# Patient Record
Sex: Male | Born: 1960 | Race: White | Hispanic: No | Smoking: Current some day smoker
Health system: Southern US, Community
[De-identification: ages and names within clinical notes are randomized; demographics above are authoritative.]

## PROBLEM LIST (undated history)

## (undated) VITALS — BP 140/103 | HR 80 | Temp 97.5°F | Resp 16 | Ht 71.0 in | Wt 160.0 lb

## (undated) DIAGNOSIS — F32A Depression, unspecified: Secondary | ICD-10-CM

## (undated) DIAGNOSIS — F419 Anxiety disorder, unspecified: Secondary | ICD-10-CM

## (undated) DIAGNOSIS — F319 Bipolar disorder, unspecified: Secondary | ICD-10-CM

## (undated) HISTORY — DX: Depression, unspecified: F32.A

## (undated) HISTORY — PX: EYE SURGERY: SHX253

## (undated) HISTORY — DX: Anxiety disorder, unspecified: F41.9

---

## 1999-12-15 ENCOUNTER — Ambulatory Visit (HOSPITAL_BASED_OUTPATIENT_CLINIC_OR_DEPARTMENT_OTHER): Admission: RE | Admit: 1999-12-15 | Discharge: 1999-12-15 | Payer: Self-pay | Admitting: Unknown Physician Specialty

## 2000-02-16 ENCOUNTER — Ambulatory Visit (HOSPITAL_BASED_OUTPATIENT_CLINIC_OR_DEPARTMENT_OTHER): Admission: RE | Admit: 2000-02-16 | Discharge: 2000-02-16 | Payer: Self-pay | Admitting: Otolaryngology

## 2004-03-31 ENCOUNTER — Inpatient Hospital Stay (HOSPITAL_COMMUNITY): Admission: AD | Admit: 2004-03-31 | Discharge: 2004-04-08 | Payer: Self-pay | Admitting: Psychiatry

## 2004-03-31 ENCOUNTER — Ambulatory Visit: Payer: Self-pay | Admitting: Psychiatry

## 2004-04-02 ENCOUNTER — Encounter: Payer: Self-pay | Admitting: Unknown Physician Specialty

## 2004-06-04 ENCOUNTER — Ambulatory Visit: Payer: Self-pay | Admitting: Psychiatry

## 2004-06-04 ENCOUNTER — Inpatient Hospital Stay (HOSPITAL_COMMUNITY): Admission: RE | Admit: 2004-06-04 | Discharge: 2004-06-14 | Payer: Self-pay | Admitting: Psychiatry

## 2008-12-06 ENCOUNTER — Emergency Department (HOSPITAL_COMMUNITY): Admission: EM | Admit: 2008-12-06 | Discharge: 2008-12-07 | Payer: Self-pay | Admitting: Emergency Medicine

## 2010-05-07 ENCOUNTER — Encounter (HOSPITAL_COMMUNITY): Payer: Self-pay | Admitting: Psychiatry

## 2010-07-23 LAB — CBC
HCT: 41 % (ref 39.0–52.0)
Hemoglobin: 14.1 g/dL (ref 13.0–17.0)
MCHC: 34.5 g/dL (ref 30.0–36.0)
MCV: 90.8 fL (ref 78.0–100.0)
Platelets: 261 10*3/uL (ref 150–400)
RBC: 4.51 MIL/uL (ref 4.22–5.81)
RDW: 12.3 % (ref 11.5–15.5)
WBC: 9.3 10*3/uL (ref 4.0–10.5)

## 2010-07-23 LAB — POCT I-STAT, CHEM 8
Calcium, Ion: 1.28 mmol/L (ref 1.12–1.32)
Creatinine, Ser: 1.1 mg/dL (ref 0.4–1.5)
Glucose, Bld: 103 mg/dL — ABNORMAL HIGH (ref 70–99)
Hemoglobin: 14.3 g/dL (ref 13.0–17.0)
TCO2: 25 mmol/L (ref 0–100)

## 2010-07-23 LAB — DIFFERENTIAL
Basophils Absolute: 0 10*3/uL (ref 0.0–0.1)
Basophils Relative: 0 % (ref 0–1)
Eosinophils Absolute: 0.1 10*3/uL (ref 0.0–0.7)
Eosinophils Relative: 1 % (ref 0–5)
Lymphocytes Relative: 23 % (ref 12–46)
Lymphs Abs: 2.1 10*3/uL (ref 0.7–4.0)
Monocytes Absolute: 0.5 10*3/uL (ref 0.1–1.0)
Monocytes Relative: 6 % (ref 3–12)
Neutro Abs: 6.4 10*3/uL (ref 1.7–7.7)
Neutrophils Relative %: 69 % (ref 43–77)

## 2010-07-23 LAB — RAPID URINE DRUG SCREEN, HOSP PERFORMED
Amphetamines: NOT DETECTED
Barbiturates: NOT DETECTED
Benzodiazepines: NOT DETECTED
Cocaine: NOT DETECTED
Opiates: NOT DETECTED
Tetrahydrocannabinol: NOT DETECTED

## 2010-07-23 LAB — ETHANOL: Alcohol, Ethyl (B): <5 mg/dL (ref 0–10)

## 2010-09-02 NOTE — H&P (Signed)
[name], [name]               ACCOUNT NO.:  [id number]   MEDICAL RECORD NO.:  [id number]          PATIENT TYPE:  IPS   LOCATION:  0400                          FACILITY:  [facility]   PHYSICIAN:  Geoffery Lyons, M.D.      DATE OF BIRTH:  [birth year]   DATE OF ADMISSION:  03/31/2004  DATE OF DISCHARGE:                         PSYCHIATRIC ADMISSION ASSESSMENT   IDENTIFYING INFORMATION:  This is a 50 year old white male who is widowed.  This is a voluntary admission.   HISTORY OF PRESENT ILLNESS:  This patient presented after cutting his ear  with scissors trying to cut off his ear lobe.  He was saying that he was God  and needed to cut off his ear.  [facility] had [facility].  He has a history of several days of becoming more  disorganized.  He had earlier in the week taken one of his sons to a  restaurant to go out to eat and forgot that he had this son with him and  left the restaurant, leaving the son there.  He seemed to be intrusive,  hugging strangers that he did not know yesterday at [facility].  He went  home because his father felt that he could stay with him and keep him safe  and he cut himself with the scissors and was returned to the emergency room  by his father.  Today I am unable to contact the family to get history  directly and the patient is a poor historian because of his psychosis.  The  nurse at [facility], Terrilee Croak, has contributed to his history.   PAST PSYCHIATRIC HISTORY:  The patient has been followed at [facility] on and off for quite some time, but has been generally noncompliant  with his medications.  He does have a history of one prior admission, that  is known, to [facility] at age 58.  This is his first admission to  [facility].   SOCIAL HISTORY:  The patient is a widow.  His wife committed suicide about a  year ago.  He is the father of two children, whom he cares for.  Had  no  known legal charges.  Employment is not known.  He does have the support of  his father, who assists him.   FAMILY HISTORY:  Not available.   ALCOHOL AND DRUG HISTORY:  [facility] has reported that they feel that he  has a problem of alcohol abuse and abused considerably more than the one 40  ounce beer per day that his father believes he is drinking and is what his  father has reported.   MEDICAL HISTORY:  The patient's primary care physician is a Dr. Meliton Rattan.   MEDICAL PROBLEMS:  Laceration of his ear.  No other known acute medical  problems.   CURRENT MEDICATIONS:  Only known medications are:  1.  Risperdal 1 mg p.o. q.h.s.  The patient did receive his first Risperdal      Consta injection 25 mg IM March 31, 2004.  2.  Also was on Lithium 450 mg three tablets q.h.s., but he has not been      taking this regularly either.  3.  Had also been prescribed trazodone 100 mg two to three tablets at      bedtime.  4.  In the past he has used Seroquel 200 mg one or two tabs at nighttime,      but this was discontinued because of the patient's complaint of      developing a reddened face and his physician was unclear if it was a      rash or flushing.   DRUG ALLERGIES:  Questionable SEROQUEL reaction.   POSITIVE PHYSICAL FINDINGS:  This is a well-nourished, well-developed male  who appears to be his stated age of 42.  He is a wearing a large bandage  around his head and we are not attempting to undo that.  He is quite  psychotic and unable to tolerate a full physical exam.  He did receive a  full physical exam in the emergency room last night and it is noted in the  record.  Vital signs on admission - Temperature 99.5; pulse 88; blood  pressure 150/78; O2 saturation was 98% in the emergency room.  He is 169  pounds and 5 feet 8.5 inches tall.  His right ear lobe was sutured in the  emergency room.   DIAGNOSTIC STUDIES:  The patient has an elevated WBC of 13,600,  hemoglobin  12,800, hematocrit 36.3.  His MCV is normal at 90.1.  His platelets are  376,000.  He did have elevated absolute neutrophils at 11.4000.  Electrolytes reveal a very slightly decreased potassium at 3.4.  His glucose  was elevated at 126 on a fasting specimen.  BUN 7, creatinine 1.7.  Liver  enzymes are normal.  Urine drug screen negative for all substances.  His  Lithium was found to be sub-therapeutic at 0.25.  Alcohol level negative.  Routine urinalysis was normal, except for some 15 mg of ketones/dL.  TSH is  currently pending.   MENTAL STATUS EXAM:  This is a fully alert male who has been wandering in  the halls today, staring at people somewhat inappropriately with a blank,  flattened affect.  He looks at me when I ask him his name, but he gives no  response.  Appears to be internally stimulated.  Does not respond verbally  or appropriately.  Somewhat moderately difficult to direct.  Mood is calm  with some impulsivity.  He has screamed out loudly occasionally.  He does  not appear to be in any physical distress.  Thought process - Patient  appears to be internally stimulated.  No overt homicidal or suicidal  thoughts or behaviors.  He is unable to contract for safety.  He is  impulsive, unpredictable.  This morning he did vomit into the toilet and  then reach down to wash his mouth out with the toilet water.  He has been  inappropriately reaching out to touch and hug females and wandering into  rooms inappropriately.  Cognitively he is unable to respond to questions  about person, place or time.   INITIAL DIAGNOSES:   AXIS I:  1.  Psychosis, not otherwise specified.  2.  Rule out bipolar I disorder.  3.  Ethanol abuse and dependence.   AXIS II:  Deferred.   AXIS III:  1.  Leukocytosis.  2.  Rule out an upper respiratory infection.  3.  Right ear laceration.   AXIS IV:  Not known.  AXIS V:  Current 30; past year unknown.   PLAN:  Voluntarily admit the  patient.  We have placed him in our intensive  care unit on one-to-one observation and we will obtain an acetaminophen  level.  Meanwhile, we will restart his Lithium at 300 mg p.o. b.i.d. and  recheck his Lithium the day after tomorrow.  Meanwhile we have restarted him  on Risperdal 1 mg p.o. q.4 h., M-Tab q.4 h. p.r.n. agitation.  We are going  to place him on intake and output and watch him closely.  We will provide  him with  routine laceration care.  We are going to attempt to get a hold of his  family and see if they can give [place] any additional history.   ESTIMATED LENGTH OF STAY:  Five days.     Marg   MAS/MEDQ  D:  04/01/2004  T:  04/01/2004  Job:  [id number]

## 2010-09-02 NOTE — Discharge Summary (Signed)
[name], [name]               ACCOUNT NO.:  [id number]   MEDICAL RECORD NO.:  [id number]          PATIENT TYPE:  IPS   LOCATION:  0302                          FACILITY:  [facility]   PHYSICIAN:  [name], M.D.      DATE OF BIRTH:  October 09, 1960   DATE OF ADMISSION:  06/04/2004  DATE OF DISCHARGE:  06/14/2004                                 DISCHARGE SUMMARY   CHIEF COMPLAINT AND PRESENT ILLNESS:  This was one of several admissions to  [name] for this 50 year old widowed white male.  Presented complaining that he has been severely depressed for the past two  weeks.  Reporting severe insomnia, decrease in appetite as well as  uncontrollable crying.  Reports suicidal thoughts with thoughts of  overdosing.  He is under stress because he is likely to lose custody of his  two [name].  His wife committed suicide 12-18 months prior to this  admission.  His in-laws have custody of the [name].  The patient  acknowledged that he is not able to provide care for his 71 and 33-year-old  sons at this particular time.   PAST PSYCHIATRIC HISTORY:  Admitted in December.  Acutely manic.  Had prior  admissions around age 64 to [name].  Being followed by [name].   ALCOHOL/DRUG HISTORY:  Denies recent use of substances.  Has abused alcohol  in the past.   MEDICAL HISTORY:  Recent pneumonia.   MEDICATIONS:  Lithium 900 mg at night and 600 mg in the morning, trazodone  200 mg at night, Ambien 10 mg at night for sleep.   PHYSICAL EXAMINATION:  Performed and failed to show any acute findings.   LABORATORY DATA:  CBC with white blood cells 12.4, hematocrit 38.8.  Blood  chemistries with glucose 109, SGOT 14, SGPT 15.  TSH 1.285.  Lithium level  1.21.  Drug screen negative for substances of abuse.   MENTAL STATUS EXAM:  Alert, cooperative male.  Appropriately groomed,  casually dressed.  Speech was not pressured.  He was not as  spontaneous.  Some psychomotor retardation.  Mood was depressed.  Affect was constricted.  Thought processes were clear, rational and goal-oriented.  No evidence of  active delusions.  No hallucinations.  Cognition was well-preserved.   ADMISSION DIAGNOSES:   AXIS I:  Bipolar disorder, depressed.   AXIS II:  No diagnosis.   AXIS III:  Upper respiratory tract infection, treated with antibiotics.   AXIS IV:  Moderate.   AXIS V:  Global Assessment of Functioning upon admission 25-30; highest  Global Assessment of Functioning in the last year 60.   [name] COURSE:  He was admitted and started in individual and group  psychotherapy.  He was kept on Risperdal 0.5 mg every six hours as needed,  maintained on lithium 600 mg in the morning and 900 mg at night, trazodone  200 mg at night, Ambien 10 mg at bedtime as needed for sleep.  Was started  on Wellbutrin XL 150 mg in the morning.  Trazodone and Ambien were not  working, so he was given some Seroquel that was increased up to 100 mg at  bedtime.  He did endorse increased signs and symptoms of depression.  He has  been taking the lithium as prescribed but, in the last several months, he  has experienced increased sadness, decreased energy, decreased motivation,  feeling overwhelmed, endorses suicidal ideation although he could contract  for safety.  Main stressor is the possibility of the in-laws taking custody  of the two [name].  Endorses that he does not sleep, he just lies there,  depressed.  There was a sense of hopelessness, helplessness, wanting to give  up. We worked with the Wellbutrin.  Sleep was an issue even with his  sleeping aid, so he was switched to the Seroquel.  Concerns about the  persistent depression that he could not shake.  He went to the grief and  loss group.  He felt no emotion but he was able to cry afterwards.  There  was some persistent psychomotor retardation, very slow processing.  Having a  hard time  taking the issue of the custody of his [name] out of his mind.  We continued to adjust the medication.  Still not sleeping well.  By  February 22nd, still depressed.  We went ahead and increased the Wellbutrin  to 300 mg per day.  On February 23rd, he was considering that if he was  discharged, it would be easier for him to recover if he was home.  His dad  was not agreeable with this possibility.  He continued to evidence the  psychomotor retardation.  Started sleeping but he was still very tired.  He  laid in his bed with his eyes closed wanting to shut the outside, feeling  overwhelmed.  Also endorsed that going to groups and going to even the  cafeteria was pretty overwhelming for him.  Continued to work with the  medications.  By February 26th, there was turning around.  Seemed to be some  improvement.  The concern was the continuous issue with the custody was  going to keep exacerbating [name]'s condition.  By February 28th, he was  better.  He was tolerating the medication well.  More alert.  Objectively,  affect was broader.  He was more involved.  He was sleeping through the  night.  Endorsed no suicidal or homicidal ideation.  We went ahead and  discharged to outpatient follow-up.   DISCHARGE DIAGNOSES:   AXIS I:  Bipolar disorder, depressed.   AXIS II:  No diagnosis.   AXIS III:  Upper respiratory tract infection, resolved   AXIS IV:  Moderate.   AXIS V:  Global Assessment of Functioning upon discharge 50-55.   DISCHARGE MEDICATIONS:  1.  Lithium 300 mg, 2 in the morning, 3 at night.  2.  Inderal 40 mg twice a day.  3.  Wellbutrin XL 300 mg, 1 in the morning.  4.  Seroquel 100 mg, 1-1/2 at night.  5.  Rozerem 8 mg at bedtime.   FOLLOW UP:  Dr. Lang Snow at [name].      IL/MEDQ  D:  07/12/2004  T:  07/12/2004  Job:  [id number]

## 2010-09-02 NOTE — Discharge Summary (Signed)
NAMEKELSO, BIBBY               ACCOUNT NO.:  [id number]   MEDICAL RECORD NO.:  [id number]          PATIENT TYPE:  IPS   LOCATION:  0406                          FACILITY:  [facility]   PHYSICIAN:  Syed T. Arfeen, M.D.   DATE OF BIRTH:  [birth year]   DATE OF ADMISSION:  03/31/2004  DATE OF DISCHARGE:  04/08/2004                                 DISCHARGE SUMMARY   HISTORY OF PRESENT ILLNESS AND IDENTIFYING INFORMATION:  The patient is a 50-  year-old white male who is widowed.  This is a voluntary admission.  The  patient presented after cutting his ear with scissors, trying to cut off his  earlobe.  He was saying that he is god and needed to cut off his ear.  He  [facility].  The patient has a history of  becoming very disorganized, grandiose for the past several days.  He had,  earlier in this week, taken one of his sons to the restaurant to go out to  eat and forgot that his son was with him and left the restaurant.  The  patient was noted very intrusive, hugging strangers that he did not know  yesterday at [facility].  The patient went home because his father felt  that he could stay with him and keep him safe, and he cut himself with  scissors and was returned to the emergency room by his father.  On the day  of admission, the patient was very disorganized and unable to provide  information, although he said that he has past psychiatric history, and he  has been not taking his medication for quite some time.   PAST PSYCHIATRIC HISTORY:  The patient has been followed at [facility] and has been noncompliant with his medication.  The patient  was a poor historian and could not give more information.  However, as per  record, the patient has been hospitalized at least one time at [facility] at age 33.  This was the patient's first admission to [facility].   ALCOHOL AND DRUG HISTORY:  As per chart.  The patient  has a history of  alcohol abuse.  However, the patient was unable to provide any information.   MEDICAL PROBLEM:  Laceration of his ear, however, no other acute medical  problems.   PHYSICAL FINDINGS:  The patient is a well-nourished, well-developed man who  appears to be his staged age of 43.  He is wearing a large bandage around  his head.  The patient was seen very psychotic, unable to tolerate a full  physical examination.  However, he had an examination done in the ER.  Vital  signs stable.   LABORATORY DATA:  The patient has an elevated white count of 13,600,  hemoglobin and hematocrit slightly on the lower side.  He has a high  neutrophil count.  Electrolytes were normal, other than decreased potassium,  which was 3.4.  Glucose was also elevated.  BUN and creatinine were normal.  Liver enzymes were normal.  Urine drug screen was negative for any illegal  substances.  His lithium was found to be sub-therapeutic at 0.25.  Alcohol  level was negative.   MENTAL STATUS EXAMINATION:  The patient is a fully alert man who has been  wandering in the hall today, staring at the people somewhat inappropriately  with blank and flattened affect.  He appears to be internally preoccupied.  It seems like he is responding to internal voices.  He was difficult to  engage in conversation, though he appears calm.  His thought processes were  illogical, irrational and difficult.  He denies any overt homicidal or  suicidal thoughts.  However, he was unable to contract for safety.  He  appears very unpredictable, suspicious and guarded.  He was also noticed  reaching out to touch and hug females and wandering in their rooms  inappropriately.  The patient was unable to answer the question about  cognition and orientation.  Insight/judgment and impulse control are very  limited.   ADMISSION DIAGNOSES:   AXIS I:  1.  Psychosis, not otherwise specified.  2.  Rule out bipolar I.  3.  History of  ethanol abuse and dependence.   AXIS II:  Deferred.   AXIS III:  1.  Leukocytosis.  2.  Rule out upper respiratory infection.  3.  Right ear laceration.   AXIS IV:  Unknown.   AXIS V:  Global assessment of functioning 20.   HOSPITAL COURSE:  The patient was admitted in the 400 hall.  He was placed  on a safety check and put on one-to-one close observation.  He was restarted  on lithium, Risperdal and Librium for any possible detox from alcohol.  He  was also started on Ativan for agitation and anxiety and Cogentin for EPS  symptoms.  He was also ordered for CT scan to rule out any organic  pathology.  The patient remains very grandiose, paranoid and very labile.  He remained very isolated and maintained very poor eye contact and seemed  most of the time responding to internal stimuli.  Medications were adjusted  and increased according with therapeutic response.  Lithium was increased up  to a total of 900 mg.  He continues to show some mood lability and pressure  speech.  Lithium level was drawn and came 0.72.  He started to show some  improvement.  He reported sleeping better and was able to contract for  safety.  One-to-one was discontinued on April 04, 2004, but he continues  to have some thought blocking with pressured speech and some disorganized  thinking.  CT scan of the brain came back negative.  The patient started to  show some improvement as medications were optimized.  He was also given the  Risperdal Consta injection of 25 mg and also started on p.o. Risperdal 0.5  mg h.s.  Lithium was increased to 600 mg in the a.m. and h.s.  A family  session was scheduled with his father who addressed the patient has not been  taking his medications and being very careless since his wife died some time  ago.  The patient was responsible to take care of his 2 kids.  However, apparently, the patient's in-laws expressed some desire to take custody from  the patient due to his  mental condition.  The patient's father delivered  this news, which the patient accepted and acknowledged.  On April 05, 2004, a Emergency planning/management officer came and handed the legal paper to the  patient, which  was filed by the patient's in-laws.  The patient started to show some  improvement.  Another family session was scheduled with his father to better  communicate about his legal issues.  The patient tolerated the medication  very well.  He seemed more organized, more social and more verbal.  In  family sessions, he addressed that he has a mental illness and in need of  taking the medication.  He reported no acute side effects of the medication.  The patient was also seen by the nurse practitioner for his ear lobe injury.  He reported no pain in ear or any where else in the body.  The patient  continues to show some improvement with good sleep.  He reported no  paranoia, grandiosity or delusion.  He was seen less labile and less  anxious.  He was seen more verbal, active and social in the groups.  He  acknowledges he has some mental health and wanted to be a followup as  outpatient in [facility].  Discharge planning was  discussed in detail with the patient and the father.  He was explained about  the injection Consta should be given on April 14, 2004 at [facility].  The patient reported no acute side effects with the  medication.  He expressed desire to be discharged with followup at the  outpatient clinic.   CONDITION ON DISCHARGE:  Remarkably improved.  Mood euthymic.  Affect  bright.  Thought processes goal-directed and logical.  Denies any auditory  hallucinations, suicide ideation or homicidal ideation.  No paranoia or  delusion.  Denies any perceptions.  He feels excited and motivated to be  discharged and followup aftercare plan.   DISCHARGE MEDICATIONS:  1.  Risperdal Consta 37.5 mg IM to be given on April 14, 2004 at  [facility].  2.  Risperdal 0.5 at bedtime until April 14, 2004.  3.  Multivitamin one daily.  4.  Thiamin one daily.  5.  Lithium carbonate 600 mg a.m. and h.s.  Lithium level was 0.59 on      April 07, 2004.   DISCHARGE DISPOSITION:  The patient was discharged with followup at [facility].  Appointment date, Thursday, April 14, 2004,  at 10:30 with Dr. Lang Snow.   DISCHARGE DIAGNOSES:   AXIS I:  Psychosis, not otherwise specified.   AXIS II:  Deferred.   AXIS III:  Right ear laceration.   AXIS IV:  Moderate.   AXIS V:  Global assessment of functioning 70.     Syed   STA/MEDQ  D:  04/20/2004  T:  04/20/2004  Job:  [id number]

## 2010-09-02 NOTE — H&P (Signed)
[name], [name]               ACCOUNT NO.:  [id number]   MEDICAL RECORD NO.:  [id number]          PATIENT TYPE:  IPS   LOCATION:  0504                          FACILITY:  [facility]   PHYSICIAN:  Geoffery Lyons, M.D.      DATE OF BIRTH:  11-Dec-1960   DATE OF ADMISSION:  06/04/2004  DATE OF DISCHARGE:                         PSYCHIATRIC ADMISSION ASSESSMENT   ADDENDUM:  [name] called back, and apparently  [name] was discontinued as of April 25, 2004 as the patient did not want to  take it.  He has been noncompliant with appointments.  He did show for an  appointment last week on May 30, 2004.  His lithium level was 0.79.  They felt at that time he was hypomanic.  He was to be taking lithium 600 mg  in the morning and 3 in the p.m. 900 mg, trazodone 100 mg in the evening and  then 200 mg at h.s., and Ambien 10 mg at h.s.      MD/MEDQ  D:  06/05/2004  T:  06/05/2004  Job:  [id number]

## 2010-09-02 NOTE — H&P (Signed)
[name], [name]               ACCOUNT NO.:  [id number]   MEDICAL RECORD NO.:  [id number]          PATIENT TYPE:  IPS   LOCATION:  0504                          FACILITY:  [facility]   PHYSICIAN:  Geoffery Lyons, M.D.      DATE OF BIRTH:  Oct 19, 1960   DATE OF ADMISSION:  06/04/2004  DATE OF DISCHARGE:                         PSYCHIATRIC ADMISSION ASSESSMENT   IDENTIFYING INFORMATION:  This is a voluntary admission to the services of  Dr. Geoffery Lyons.  This is a 50 year old widowed white male.  Apparently he  presented here yesterday complaining that he has been severely depressed for  the past 2 weeks.  He was also reporting severe insomnia with a decrease in  appetite as well as uncontrollable crying.  He reports suicidal ideations  with thoughts of overdosing.  He is under stress because he is likely to  lose custody of his 2 children.  His wife committed suicide 12-18 months  ago.  Her parents, his in-laws, have custody of the children.  Patient  readily acknowledges that he is unable to provide care for his 70- and 52-year-  old sons at this time.   PAST PSYCHIATRIC HISTORY:  The patient was admitted here back in December.  He had had prior admissions around age 42 to [facility] for bipolar illness.  He is followed on an outpatient basis at [facility] by  Dr. Ezzard Flax.   SOCIAL HISTORY:  He had 2 years of college.  He is currently employed part-  time at a pizza place.  His wife died in 08/28/02.  His mom and dad are here in  [place].  His in-laws have custody of the 50- and 66-year-old sons.   FAMILY HISTORY:  He denies.   ALCOHOL OR DRUG HISTORY:  He denies any recent problems, although he is  known to have abused alcohol in the past.   [facility] PH[facility]IC[facility]   MEDICAL PROBLEMS:  He states that he was prescribed an antibiotic a week or  so ago for pneumonia.  I checked with his regular pharmacy, [facility] [address], [phone], and they have no  history for a prescription.  His father  will check at home to see if there are any antibiotics.   CURRENTLY PRESCRIBED MEDICATIONS:  1.  Lithium 900 mg q.h.s.  2.  Lithium 600 mg in the morning.  3.  Trazodone 200 mg at h.s.  4.  Ambien 10 mg at h.s. p.r.n.   PHYSICAL EXAMINATION:  Unremarkable.  He did not have an elevated  temperature.  He is not coughing.  I am not sure why his white blood cell  count is elevated at 12.4.  Also his glucose is elevated at 109.  When he  was here back in December, his glucose was elevated as well.  No other  positive physical findings.   MENTAL STATUS EXAM:  He is alert and oriented x 3.  He is appropriately  groomed, casually dressed.  His nutritional status is appropriate.  His  speech is not pressured.  He is depressed.  His  affect is constricted.  His  thought processes were clear, rational and goal-oriented.  He does not want  to have to go to court.  Cognitive, his judgment and insight were intact.  Concentration and memory are intact.  Intelligence is at least average.  He  is not suicidal or homicidal.  He is not experiencing auditory or visual  hallucinations.  On admission he stated that he had started to find symbols  in the world that have spiritual significance.  When discharged back in  December, he was to have had a Risperdal Consta shot on April 14, 2004.  I have a call out to [facility] to see if he has been on  the [place] or not.  I have tried to evaluate his symptoms in regards to  that.   DIAGNOSES:   AXIS I:  1.  Bipolar, currently depressed, rule out psychotic features.  2.  Starting to have spiritual preoccupation.   AXIS II:  Rule out personality disorder.   AXIS III:  Recent upper respiratory infection of some type, treated with  antibiotics.  We are trying to chase that down, as his white blood cell  count is still elevated at 12.4.   AXIS IV:  Problems with primary support group, housing,  economic, other  psychosocial problems.   AXIS V:  30.   PLAN:  Admit for safety and stabilization, to adjust his medications as  indicated.  Lithium level is still pending.  Coordinate with his lawyer  regarding his appearances in court.      MD/MEDQ  D:  06/05/2004  T:  06/05/2004  Job:  [id number]

## 2012-02-10 ENCOUNTER — Emergency Department (HOSPITAL_COMMUNITY)
Admission: EM | Admit: 2012-02-10 | Discharge: 2012-02-11 | Disposition: A | Discharge status: Other Acute Inpatient Hospital | Payer: Self-pay | Attending: Emergency Medicine | Admitting: Emergency Medicine

## 2012-02-10 ENCOUNTER — Encounter (HOSPITAL_COMMUNITY): Payer: Self-pay | Admitting: Emergency Medicine

## 2012-02-10 DIAGNOSIS — F319 Bipolar disorder, unspecified: Secondary | ICD-10-CM | POA: Insufficient documentation

## 2012-02-10 DIAGNOSIS — Z0289 Encounter for other administrative examinations: Secondary | ICD-10-CM | POA: Insufficient documentation

## 2012-02-10 HISTORY — DX: Bipolar disorder, unspecified: F31.9

## 2012-02-10 LAB — COMPREHENSIVE METABOLIC PANEL
Albumin: 4.1 g/dL (ref 3.5–5.2)
Alkaline Phosphatase: 78 U/L (ref 39–117)
BUN: 11 mg/dL (ref 6–23)
Calcium: 9.8 mg/dL (ref 8.4–10.5)
GFR calc Af Amer: 76 mL/min — ABNORMAL LOW (ref >90)
Potassium: 3.6 mEq/L (ref 3.5–5.1)
Sodium: 138 mEq/L (ref 135–145)
Total Protein: 7.5 g/dL (ref 6.0–8.3)

## 2012-02-10 LAB — RAPID URINE DRUG SCREEN, HOSP PERFORMED
Amphetamines: NOT DETECTED
Opiates: NOT DETECTED

## 2012-02-10 LAB — URINALYSIS, ROUTINE W REFLEX MICROSCOPIC
Glucose, UA: NEGATIVE mg/dL
Leukocytes, UA: NEGATIVE
Nitrite: NEGATIVE
Protein, ur: 30 mg/dL — AB
Urobilinogen, UA: 0.2 mg/dL (ref 0.0–1.0)

## 2012-02-10 LAB — CBC WITH DIFFERENTIAL/PLATELET
Basophils Relative: 0 % (ref 0–1)
Eosinophils Absolute: 0.1 10*3/uL (ref 0.0–0.7)
Eosinophils Relative: 1 % (ref 0–5)
MCH: 31.3 pg (ref 26.0–34.0)
MCHC: 34.6 g/dL (ref 30.0–36.0)
MCV: 90.6 fL (ref 78.0–100.0)
Monocytes Relative: 6 % (ref 3–12)
Neutrophils Relative %: 74 % (ref 43–77)
Platelets: 303 10*3/uL (ref 150–400)

## 2012-02-10 LAB — URINE MICROSCOPIC-ADD ON

## 2012-02-10 LAB — SALICYLATE LEVEL: Salicylate Lvl: <2 mg/dL — ABNORMAL LOW (ref 2.8–20.0)

## 2012-02-10 LAB — ETHANOL: Alcohol, Ethyl (B): <11 mg/dL (ref 0–11)

## 2012-02-10 MED ORDER — TRAZODONE HCL 100 MG PO TABS: 200.0000 mg | ORAL_TABLET | Freq: Every day | ORAL | Status: DC

## 2012-02-10 MED ORDER — QUETIAPINE FUMARATE ER 300 MG PO TB24
300.0000 mg | ORAL_TABLET | Freq: Every day | ORAL | Status: DC
  Administered 2012-02-10: 300 mg via ORAL
  Filled 2012-02-10 (×2): qty 1

## 2012-02-10 MED ORDER — ZOLPIDEM TARTRATE 5 MG PO TABS: 5.0000 mg | ORAL_TABLET | Freq: Every evening | ORAL | Status: DC | PRN

## 2012-02-10 MED ORDER — LORAZEPAM 1 MG PO TABS: 1.0000 mg | ORAL_TABLET | Freq: Three times a day (TID) | ORAL | Status: DC | PRN

## 2012-02-10 MED ORDER — TRAZODONE HCL 100 MG PO TABS
100.0000 mg | ORAL_TABLET | Freq: Every day | ORAL | Status: DC
  Administered 2012-02-10: 100 mg via ORAL
  Filled 2012-02-10: qty 1

## 2012-02-10 MED ORDER — LITHIUM CARBONATE 300 MG PO CAPS
300.0000 mg | ORAL_CAPSULE | Freq: Two times a day (BID) | ORAL | Status: DC
  Administered 2012-02-10 – 2012-02-11 (×2): 300 mg via ORAL
  Filled 2012-02-10 (×2): qty 1

## 2012-02-10 MED ORDER — LITHIUM CARBONATE 150 MG PO CAPS
150.0000 mg | ORAL_CAPSULE | Freq: Two times a day (BID) | ORAL | Status: DC
  Administered 2012-02-10: 150 mg via ORAL
  Filled 2012-02-10 (×2): qty 1

## 2012-02-10 MED ORDER — IBUPROFEN 200 MG PO TABS: 200.0000 mg | ORAL_TABLET | Freq: Four times a day (QID) | ORAL | Status: DC | PRN

## 2012-02-10 MED ORDER — QUETIAPINE FUMARATE ER 400 MG PO TB24
400.0000 mg | ORAL_TABLET | Freq: Every day | ORAL | Status: DC
  Filled 2012-02-10: qty 1

## 2012-02-10 MED ORDER — ONDANSETRON HCL 4 MG PO TABS
4.0000 mg | ORAL_TABLET | Freq: Three times a day (TID) | ORAL | Status: DC | PRN
Start: 2012-02-10 — End: 2012-02-11

## 2012-02-10 NOTE — ED Notes (Signed)
Patient has 2 bags of belongings in locker #4 in triage

## 2012-02-10 NOTE — ED Notes (Signed)
telepsych completed. Father of pt present and spoke to psychiatrist as well. Father is recommending inpatient treatment to stabilize pt.

## 2012-02-10 NOTE — ED Notes (Signed)
Patient is in blue scrubs and red socks
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
## 2012-02-10 NOTE — ED Notes (Signed)
Awake, watching TV, now he doesn't want to go to inpt hospital because he starts a job on Monday and he's afraid if he doesn't start on Monday he will lose the job.

## 2012-02-10 NOTE — ED Notes (Signed)
Pt up to bathroom.

## 2012-02-10 NOTE — ED Notes (Signed)
Pt belongings x2 bags in TCU locker #42

## 2012-02-10 NOTE — ED Notes (Signed)
MD at bedside. 

## 2012-02-10 NOTE — ED Notes (Signed)
Consult called to [facility].

## 2012-02-10 NOTE — ED Notes (Signed)
Pt up to shower

## 2012-02-10 NOTE — ED Notes (Signed)
Pt. and belongings have both been wanded by security.  

## 2012-02-10 NOTE — BH Assessment (Addendum)
Assessment Note   [name] is an 51 y.o. male who presented to [facility] with the c/o manic behavior and pressured speech. Patient reported to clinician that he was working for [employer] for 4 years when most recently he was laid off, causing him to feel depressed and upset with his company. Patient stated "they force you to work 12 hours a day. 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 I shouldn't have been the one fired." Patient reports financial stressors in addition to difficulties of coping with the suicide of his wife. During assessment patient exhibited moderate pressured speech, flight of ideas, and the need for constant redirection by clinician. Patient reported that he is currently taking Lithium to stabilize his mood; however, he feels that his medications need further evaluation because "I don't think that it's working. Maybe its just me". Patient stated that he currently experiences insomnia and lack of appetite. Patient has received inpatient treatment at [facility]) in the past and is currently receiving medication management through [facility].  Patient verbalized to clinician that there was an occurrence in which he self-mutilated himself by utilizing scissors to cut his ear lobe. "I did it for attention. That's the only way my parents would listen to me." Patient denies suicidal/homicidal ideations in addition to auditory/visual hallucinations at this time. Patient has been evaluated by Specialist on Call who also recommend inpatient treatment for stabilization.    Axis I: Bipolar, Manic Axis II: Deferred Axis III:  Past Medical History  Diagnosis Date  . Bipolar 1 disorder    Axis IV: economic problems, housing problems, occupational problems, other psychosocial or environmental problems, problems related to social environment and problems with primary support group Axis V: 41-50 serious symptoms  Past Medical  History:  Past Medical History  Diagnosis Date  . Bipolar 1 disorder     History reviewed. No pertinent past surgical history.  Family History: No family history on file.  Social History:  reports that he has never smoked. He has never used smokeless tobacco. He reports that he drinks about 10.8 ounces of alcohol per week. He reports that he does not use illicit drugs.  Additional Social History:  Alcohol / Drug Use Pain Medications: See MAR Prescriptions: See MAR Over the Counter: See MAR History of alcohol / drug use?: Yes Substance #1 Name of Substance 1: ETOH 1 - Age of First Use: 20 1 - Amount (size/oz): varies 1 - Frequency: daily  1 - Duration: years 1 - Last Use / Amount: 02/08/12- 3 beers  CIWA: CIWA-Ar BP: 133/83 mmHg Pulse Rate: 94  COWS:    Allergies:  Allergies  Allergen Reactions  . Medrol (Methylprednisolone) Rash    Home Medications:  (Not in a hospital admission)  OB/GYN Status:  No LMP for male patient.  General Assessment Data Location of Assessment: [facility] Living Arrangements: Other relatives (Mother) Can pt return to current living arrangement?: Yes Admission Status: Voluntary Is patient capable of signing voluntary admission?: Yes Transfer from: Acute Hospital Referral Source: Self/Family/Friend  Education Status Is patient currently in school?: No  Risk to self Suicidal Ideation: No-Not Currently/Within Last 6 Months Suicidal Intent: No-Not Currently/Within Last 6 Months Is patient at risk for suicide?: No Suicidal Plan?: No-Not Currently/Within Last 6 Months Access to Means: Yes Specify Access to Suicidal Means: Access to streets and objects What has been your use of drugs/alcohol within the last 12 months?: ETOH Previous Attempts/Gestures: Yes How  many times?: 1  Other Self Harm Risks: Impulsivity Triggers for Past Attempts: Unpredictable Intentional Self Injurious Behavior:  (Sliced his ear with scissors) Family Suicide  History: Yes (Wife committed suicide) Recent stressful life event(s): Other (Comment);Job Loss (Loss of wife and job) Persecutory voices/beliefs?: No Depression: Yes Depression Symptoms: Loss of interest in usual pleasures;Feeling worthless/self pity Substance abuse history and/or treatment for substance abuse?: No Suicide prevention information given to non-admitted patients: Not applicable  Risk to Others Homicidal Ideation: No Thoughts of Harm to Others: No Current Homicidal Intent: No Current Homicidal Plan: No Access to Homicidal Means: No Identified Victim: None Reported History of harm to others?: No Assessment of Violence: None Noted Violent Behavior Description: Pt is calm and cooperative Does patient have access to weapons?: No Criminal Charges Pending?: No Does patient have a court date: No  Psychosis Hallucinations: None noted Delusions: None noted  Mental Status Report Appear/Hygiene: Disheveled Eye Contact: Good Motor Activity: Freedom of movement Speech: Pressured;Rapid Level of Consciousness: Alert Mood: Anxious Affect: Anxious Anxiety Level: Minimal Thought Processes: Flight of Ideas;Coherent Judgement: Impaired Orientation: Person;Place;Time;Situation Obsessive Compulsive Thoughts/Behaviors: None  Cognitive Functioning Concentration: Normal Memory: Recent Intact;Remote Intact IQ: Average Insight: Fair Impulse Control: Poor Appetite: Poor Weight Loss: 10  Weight Gain: 0  Sleep: Decreased Total Hours of Sleep: 4  Vegetative Symptoms: None  ADLScreening [facility] Assessment Services) Patient's cognitive ability adequate to safely complete daily activities?: Yes Patient able to express need for assistance with ADLs?: Yes Independently performs ADLs?: Yes (appropriate for developmental age)  Abuse/Neglect [facility]) Physical Abuse: Denies Verbal Abuse: Denies Sexual Abuse: Denies  Prior Inpatient Therapy Prior Inpatient Therapy: Yes Prior Therapy  Dates: 1983 Prior Therapy Facilty/Provider(s): [facility]) Reason for Treatment: Psych  Prior Outpatient Therapy Prior Outpatient Therapy: Yes Prior Therapy Dates: Current Prior Therapy Facilty/Provider(s): [facility]- Dr. Johnney Ou Reason for Treatment: Med Mgmt  ADL Screening (condition at time of admission) Patient's cognitive ability adequate to safely complete daily activities?: Yes Patient able to express need for assistance with ADLs?: Yes Independently performs ADLs?: Yes (appropriate for developmental age)       Abuse/Neglect Assessment (Assessment to be complete while patient is alone) Physical Abuse: Denies Verbal Abuse: Denies Sexual Abuse: Denies Exploitation of patient/patient's resources: Denies Self-Neglect: Denies Values / Beliefs Cultural Requests During Hospitalization: None Spiritual Requests During Hospitalization: None Consults Spiritual Care Consult Needed: No Social Work Consult Needed: No      Additional Information 1:1 In Past 12 Months?: No CIRT Risk: No Elopement Risk: No Does patient have medical clearance?: Yes     Disposition: Recommendation per telepsych for inpatient treatment Disposition Disposition of Patient: Inpatient treatment program Type of inpatient treatment program: Adult  On Site Evaluation by:  Self Reviewed with Physician:     Janann Colonel C 02/10/2012 3:50 PM
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
## 2012-02-11 ENCOUNTER — Inpatient Hospital Stay (HOSPITAL_COMMUNITY)
Admission: AD | Admit: 2012-02-11 | Discharge: 2012-02-15 | DRG: 885 | Disposition: A | Discharge status: Home / Self Care | Payer: No Typology Code available for payment source | Source: Ambulatory Visit | Attending: Psychiatry | Admitting: Psychiatry

## 2012-02-11 DIAGNOSIS — F3110 Bipolar disorder, current episode manic without psychotic features, unspecified: Principal | ICD-10-CM | POA: Diagnosis present

## 2012-02-11 DIAGNOSIS — F319 Bipolar disorder, unspecified: Secondary | ICD-10-CM | POA: Diagnosis present

## 2012-02-11 DIAGNOSIS — Z79899 Other long term (current) drug therapy: Secondary | ICD-10-CM

## 2012-02-11 MED ORDER — TRAZODONE HCL 100 MG PO TABS
100.0000 mg | ORAL_TABLET | Freq: Every day | ORAL | Status: DC
  Administered 2012-02-11: 100 mg via ORAL
  Filled 2012-02-11 (×2): qty 1

## 2012-02-11 MED ORDER — QUETIAPINE FUMARATE ER 300 MG PO TB24
300.0000 mg | ORAL_TABLET | Freq: Every day | ORAL | Status: DC
  Administered 2012-02-11: 300 mg via ORAL
  Filled 2012-02-11 (×3): qty 1

## 2012-02-11 MED ORDER — LITHIUM CARBONATE 300 MG PO CAPS
300.0000 mg | ORAL_CAPSULE | Freq: Two times a day (BID) | ORAL | Status: DC
  Administered 2012-02-12: 300 mg via ORAL
  Filled 2012-02-11 (×3): qty 1

## 2012-02-11 MED ORDER — MAGNESIUM HYDROXIDE 400 MG/5ML PO SUSP: 30.0000 mL | Freq: Every day | ORAL | Status: DC | PRN

## 2012-02-11 MED ORDER — ALUM & MAG HYDROXIDE-SIMETH 200-200-20 MG/5ML PO SUSP: 30.0000 mL | ORAL | Status: DC | PRN

## 2012-02-11 MED ORDER — ACETAMINOPHEN 325 MG PO TABS: 650.0000 mg | ORAL_TABLET | Freq: Four times a day (QID) | ORAL | Status: DC | PRN

## 2012-02-11 NOTE — ED Notes (Signed)
Admissions are currently on hold at [facility] due to staffing. Will give report when pt is closer to transferring to [facility].

## 2012-02-11 NOTE — ED Notes (Signed)
PT TRANSFERRED TO [facility] VIA SECURITY AND TECH. TWO PERSONAL BELONGINGS BAGS SENT WITH PT.
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
## 2012-02-11 NOTE — Tx Team (Signed)
Initial Interdisciplinary Treatment Plan  PATIENT STRENGTHS: (choose at least two) Average or above average intelligence Motivation for treatment/growth Supportive family/friends  PATIENT STRESSORS: Legal issue Marital or family conflict Occupational concerns   PROBLEM LIST: Problem List/Patient Goals Date to be addressed Date deferred Reason deferred Estimated date of resolution  Bipolar, manic  02-11-12           Non compliance with meds  02-11-12                                          DISCHARGE CRITERIA:  Adequate post-discharge living arrangements Improved stabilization in mood, thinking, and/or behavior Verbal commitment to aftercare and medication compliance  PRELIMINARY DISCHARGE PLAN: Attend aftercare/continuing care group Return to previous living arrangement Return to previous work or school arrangements  PATIENT/FAMIILY INVOLVEMENT: This treatment plan has been presented to and reviewed with the patient, [name], and/or family member, .  The patient and family have been given the opportunity to ask questions and make suggestions.  Valente David 02/11/2012, 7:03 PM
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
## 2012-02-11 NOTE — ED Notes (Signed)
Pt's father and wife called in stating that they've gone through pt's things and found some items that indicate to them that pt is doing illegal drugs such as nitrous oxide inhalant, a little pipe and some threatening letters to people at [employer] where he used to work stating that "you are f##### and I'm gonna get you". They say they don't think pt delivered any of the letters to anyone at [facility]. They also don't want pt to know this information came from them as that would make him hate them and they just want him to get the best treatment possible and we have to have all the information about pt in order to give him that treatment.
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
## 2012-02-11 NOTE — BH Assessment (Signed)
BHH Assessment Progress Note      Called area facilities to inquire about bed availability.  Per Patsy at [facility] there no beds available ([phone]).  Per Jacob at [facility] there are no beds available (2355).  Left message at [facility] ([phone]).  Sponsorship beds available for [place] [facility] at [facility] per Jackie (0200).  Faxed referral.  

## 2012-02-12 ENCOUNTER — Encounter (HOSPITAL_COMMUNITY): Payer: Self-pay | Admitting: Psychiatry

## 2012-02-12 DIAGNOSIS — F3110 Bipolar disorder, current episode manic without psychotic features, unspecified: Principal | ICD-10-CM

## 2012-02-12 DIAGNOSIS — F319 Bipolar disorder, unspecified: Secondary | ICD-10-CM | POA: Diagnosis present

## 2012-02-12 MED ORDER — TRAZODONE HCL 100 MG PO TABS
200.0000 mg | ORAL_TABLET | Freq: Every day | ORAL | Status: DC
  Administered 2012-02-12 – 2012-02-14 (×3): 200 mg via ORAL
  Filled 2012-02-12 (×3): qty 2
  Filled 2012-02-12: qty 28
  Filled 2012-02-12: qty 2

## 2012-02-12 MED ORDER — QUETIAPINE FUMARATE ER 400 MG PO TB24: 400.0000 mg | ORAL_TABLET | Freq: Every day | ORAL | Status: DC

## 2012-02-12 MED ORDER — LITHIUM CARBONATE 300 MG PO CAPS
450.0000 mg | ORAL_CAPSULE | Freq: Two times a day (BID) | ORAL | Status: DC
  Administered 2012-02-12 – 2012-02-15 (×6): 450 mg via ORAL
  Filled 2012-02-12 (×10): qty 1

## 2012-02-12 MED ORDER — IBUPROFEN 200 MG PO TABS: 200.0000 mg | ORAL_TABLET | Freq: Four times a day (QID) | ORAL | Status: DC | PRN

## 2012-02-12 MED ORDER — ADULT MULTIVITAMIN W/MINERALS CH
1.0000 | ORAL_TABLET | Freq: Every day | ORAL | Status: DC
  Administered 2012-02-12 – 2012-02-15 (×4): 1 via ORAL
  Filled 2012-02-12 (×3): qty 1
  Filled 2012-02-12: qty 14
  Filled 2012-02-12 (×2): qty 1

## 2012-02-12 MED ORDER — QUETIAPINE FUMARATE ER 400 MG PO TB24
400.0000 mg | ORAL_TABLET | Freq: Every day | ORAL | Status: DC
  Administered 2012-02-12 – 2012-02-14 (×3): 400 mg via ORAL
  Filled 2012-02-12: qty 14
  Filled 2012-02-12 (×4): qty 1

## 2012-02-12 NOTE — BHH Suicide Risk Assessment (Signed)
Suicide Risk Assessment  Admission Assessment     Nursing information obtained from:  Patient Demographic factors:  Male;Adolescent or young adult;Divorced or widowed;Caucasian;Unemployed Current Mental Status:   (denies si/hi/av ) Loss Factors:  Decrease in vocational status;Legal issues;Financial problems / change in socioeconomic status Historical Factors:  Family history of suicide;Family history of mental illness or substance abuse;Impulsivity Risk Reduction Factors:  Responsible for children under 81 years of age;Sense of responsibility to family;Religious beliefs about death;Living with another person, especially a relative;Positive social support  CLINICAL FACTORS: Bipolar I Disorder  COGNITIVE FEATURES THAT CONTRIBUTE TO RISK: None    SUICIDE RISK:   Minimal: No identifiable suicidal ideation.  Patients presenting with no risk factors but with morbid ruminations; may be classified as minimal risk based on the severity of the depressive symptoms  PLAN OF CARE: Supportive approach/coping skills Resume his medications: Lithium Carbonate 450 mg BID                                              Seroquel XR 400 mg HS                                              Trazodone 200 mg HS PRN sleep  [name] A 02/12/2012, 1:40 PM

## 2012-02-12 NOTE — Progress Notes (Signed)
Patient ID: [name], male   DOB: June 08, 1960, 51 y.o.   MRN: [id number] D)  Pt has been out on hall, attended group, talkative, interacting well with staff and peers.  Came to med window afterward, talkative, pleasant, took his meds and then didn't remember taking both pills, was sure he had only taken one.  Hyperverbal but pleasant, was sure he wouldn't be able to sleep without both the seroquel and trazadone.  Assured him he had had them both.  Finally agreed he could have taken his meds and not been paying attention d/t talking.  Went to bed and was soon asleep.Marland Kitchen  A)   Will continue to monitor q 15 minutes for safety. R) Safety maintained.
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
## 2012-02-12 NOTE — Progress Notes (Signed)
Psychoeducational Group Note  Date:  02/12/2012 Time:  2000  Group Topic/Focus:  Wrap-Up Group:   The focus of this group is to help patients review their daily goal of treatment and discuss progress on daily workbooks.  Participation Level:  Active  Participation Quality:  Attentive and Sharing  Affect:  Flat  Cognitive:  Oriented  Insight:  Good  Engagement in Group:  Good  Additional Comments:  Patient shared that he was getting more used to the idea of the hospital and he is trying to get his medications straightened out.  Hersh Minney, [place] 02/12/2012, 9:39 PM

## 2012-02-12 NOTE — Tx Team (Signed)
Interdisciplinary Treatment Plan Update (Adult)  Date:  02/12/2012  Time Reviewed:  10:01 AM   Progress in Treatment: Attending groups: Yes Participating in groups:  Yes Taking medication as prescribed: Yes Tolerating medication:  Yes Family/Significant othe contact made:  CSW will assess for appropriate contact Patient understands diagnosis:  Yes Discussing patient identified problems/goals with staff:  Yes Medical problems stabilized or resolved:  Yes Denies suicidal/homicidal ideation: Yes Issues/concerns per patient self-inventory:  None identified Other: N/A  New problem(s) identified: None Identified  Reason for Continuation of Hospitalization: Anxiety Depression Medication stabilization Withdrawal Symptoms  Interventions implemented related to continuation of hospitalization: mood stabilization, medication monitoring and adjustment, group therapy and psycho education, safety checks q 15 mins  Additional comments: N/A  Estimated length of stay: 3-5 days  Discharge Plan: SW will assess for appropriate referrals.    New goal(s): N/A  Review of initial/current patient goals per problem list:    1.  Goal(s): Address substance use  Met:  No  Target date: by discharge  As evidenced by: completing detox protocol and refer to appropriate treatment  2.  Goal (s): Reduce depressive and anxiety symptoms  Met:  No  Target date: by discharge  As evidenced by: Reducing depression from a 10 to a 3 as reported by pt.   Attendees: Patient:     Family:     Physician:  Irving Lugo, MD 02/12/2012 9:52 AM   Nursing: Donna Shimp, RN 02/12/2012 9:52 AM   Clinical Social Worker:  Belem Hintze Horton, LCSWA 02/12/2012 9:52 AM   Other: Jake King, Psyc intern 02/12/2012 9:52 AM   Other:  Sheila Lilly, RN 02/12/2012 9:53 AM   Other:  Linda Jehu, RN 02/12/2012 9:53 AM   Other:  Patrice White, RN 02/12/2012 9:53 AM   Other:      Scribe for Treatment Team:   Delicia Berens Horton  02/12/2012 9:52 AM   

## 2012-02-12 NOTE — Progress Notes (Addendum)
Patient ID: [name], male   DOB: 1960-10-06, 51 y.o.   MRN: [id number] D:  Patient's self inventory sheet, patient sleeps well, has good/improving appetite, normal energy level, improving attention span.  Denied depression, hopelessness, anxiety.  Denied withdrawals.   Denied SI.  Denied physical problems. Zero pain, zero pain goal.  After discharge, plans to "take meds regularly, return to work, exercise, attend bipolar meetings."  No questions for staff at this time.  Does have discharge plans.  No problems taking meds after discharge. A:  Medications given per MD order.  Support and encouragement given throughout day.  Support and safety checks completed as ordered. R:  Following treatment plan.  Denied SI and HI.  Denied A/V hallucinations.  Denied pain.  Contracts for safety.  Patient remains safe and receptive on unit.  Patient has been cooperative and pleasant.   Has attended and participated in groups today.

## 2012-02-12 NOTE — Progress Notes (Signed)
Nutrition Note  Reason: MST score of 3   Patient reported he is eating great now. He reported he is eating 2 servings at meal times and eating snacks. He reported PTA he was not eating well due to living in his car and having limited food supplies. He reported he has lost some weight due to his situation but he had been wanting to loose some weight.   Wt Readings from Last 10 Encounters:  02/11/12 169 lb (76.658 kg)   I have educated the patient on good sources of protein. I have encouraged the patient to have adequate PO intake. He was without any nutrition related questions and verbalized understanding of the nutrition information provided.   RD available for nutrition needs.   Iven Finn [facility] [phone]

## 2012-02-12 NOTE — Progress Notes (Signed)
Psychoeducational Group Note  Date:  02/12/2012 Time:  1100  Group Topic/Focus:  Self Care:   The focus of this group is to help patients understand the importance of self-care in order to improve or restore emotional, physical, spiritual, interpersonal, and financial health.  Participation Level:  Active  Participation Quality:  Appropriate, Sharing and Supportive  Affect:  Appropriate  Cognitive:  Appropriate  Insight:  Good  Engagement in Group:  Good  Additional Comments: none  Sirenity Shew M 02/12/2012, 1:11 PM

## 2012-02-12 NOTE — Progress Notes (Signed)
Psychoeducational Group Note  Date:  02/12/2012 Time:  1:15  Group Topic/Focus:  Overcoming Obstacles to Wellness  Participation Level:  Active  Participation Quality:  Appropriate  Affect:  Appropriate  Cognitive:  Appropriate  Insight:  Good  Engagement in Group:  Good  Additional Comments:  [name] actively participated in group, including answering questions and responding to other group members.  [name],Marc S 02/12/2012, 2:40 PM  Ashley Jacobs, MSW LCSW [phone]
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
## 2012-02-13 NOTE — Progress Notes (Signed)
BHH Group Notes:  (Counselor/Nursing/MHT/Case Management/Adjunct)  02/13/2012 10:04 PM  Type of Therapy:  Psychoeducational Skills  Participation Level:  Active  Participation Quality:  Appropriate  Affect:  Appropriate  Cognitive:  Appropriate  Insight:  Good  Engagement in Group:  Good  Engagement in Therapy:  Good  Modes of Intervention:  Problem-solving  Summary of Progress/Problems: [name] was able to share with the group his problems and his steps he is making to be a better parent and person.     [name] [place] 02/13/2012, 10:04 PM
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
## 2012-02-13 NOTE — Progress Notes (Signed)
Psychoeducational Group Note  Date:  02/13/2012 Time:  1100  Group Topic/Focus:  Recovery Goals:   The focus of this group is to identify appropriate goals for recovery and establish a plan to achieve them.  Participation Level:  Active  Participation Quality:  Appropriate and Attentive  Affect:  Appropriate  Cognitive:  Alert and Appropriate  Insight:  Good  Engagement in Group:  Good  Additional Comments:  Pt. Identified changes they want to make in order to achieve recovery as well as goals in order to make those changes.    [name] [place] 02/13/2012, 1:08 PM

## 2012-02-13 NOTE — Progress Notes (Signed)
   Grief and Loss Group  Group focused on a variety of losses they had experienced from loss of self and grief over lack of parental support and nurture to suicide of family members and how that has impacted them.   Pt shared the story of his ex-wife's suicide and the dissolution of their marriage before this happened.Marland Kitchen He talked about his struggle with [employer] and his concern for not having a more significant relationship with is children. He feels some resentment that his ex-in laws are raising his children.   Dyke Maes

## 2012-02-13 NOTE — Progress Notes (Addendum)
D:  Patient's self inventory sheet, patient sleeps well, has good appetite, high energy level, good attention span.  Denied depression and hopelessness.  Denied SI.  Denied physical problems.  "Take meds as prescribed, exercise more, play more guitar, keep up good hygiene, read more.  No questions for staff, they've all been most excellent and very knowledgeable."  Does have discharge plans.  No problems taking meds after discharge. A:  Medications given as prescribed by MD.  Support and encouragement given throughout day.  Support and safety checks completed as ordered. R:   Following treatment plan.  Denied SI and HI.   Denied A/V hallucinations.  Contracts for safety.  Patient remains safe and receptive on unit.  Patient has been cooperative and pleasant.  Attended groups today.
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
## 2012-02-14 MED ORDER — ACETAMINOPHEN 325 MG PO TABS: 650.0000 mg | ORAL_TABLET | Freq: Four times a day (QID) | ORAL | Status: DC | PRN

## 2012-02-14 MED ORDER — ALUM & MAG HYDROXIDE-SIMETH 200-200-20 MG/5ML PO SUSP: 30.0000 mL | ORAL | Status: DC | PRN

## 2012-02-14 MED ORDER — MAGNESIUM HYDROXIDE 400 MG/5ML PO SUSP: 30.0000 mL | Freq: Every day | ORAL | Status: DC | PRN

## 2012-02-14 NOTE — Progress Notes (Signed)
Psychoeducational Group Note  Date:  02/14/2012 Time:  1100  Group Topic/Focus:  Personal Choices and Values:   The focus of this group is to help patients assess and explore the importance of values in their lives, how their values affect their decisions, how they express their values and what opposes their expression.  Participation Level:  Active  Participation Quality:  Appropriate, Attentive and Sharing  Affect:  Appropriate  Cognitive:  Appropriate  Insight:  Good  Engagement in Group:  Good  Additional Comments:    In group of Identifying values and choices, patient participated and shared personal definition on what values meant and how values and choices affect personal life. Patient also gave examples of how values could change throughout our lifespan. Patient was given the opportunity to go through identifying values form in workbook and state what values were important in patient life. Patient then completed choosing a value-oriented life worksheet in relation to identifying values form.   Karleen Hampshire Brittini 02/14/2012, 2:12 PM

## 2012-02-14 NOTE — Progress Notes (Signed)
Psychoeducational Group Note  Date:  02/14/2012 Time:  2000  Group Topic/Focus:  Wrap-Up Group:   The focus of this group is to help patients review their daily goal of treatment and discuss progress on daily workbooks.  Participation Level:  Active  Participation Quality:  Sharing  Affect:  Appropriate  Cognitive:  Appropriate  Insight:  Good  Engagement in Group:  Good  Additional Comments:  Patient shared that he had a good day and looked forward to going home since he had been here for so long.  [name], [place] Pigg 02/14/2012, 10:31 PM

## 2012-02-14 NOTE — Progress Notes (Signed)
[facility] MD Progress Note  02/14/2012 3:18 PM [name]  MRN:  [id number]  Diagnosis:   Axis I: Bipolar, Manic Axis II: Deferred Axis III:  Past Medical History  Diagnosis Date  . Bipolar 1 disorder    Axis IV: economic problems, housing problems and problems with primary support group Axis V: 51-60 moderate symptoms  ADL's:  Intact  Sleep: Good  Appetite:  Fair  Suicidal Ideation:  Plan:  Denies Intent:  denies Means:  denies Homicidal Ideation:  Plan:  Denies Intent:  Denies Means:  Denies  [name] endorses that he is doing better. Able to look back at how bad he was when he was here in 2009. He wants to be sure he stays on his medications as he has been doing. His father came to confront him about some substances and paraphernalia that he found in his car. [name] claimed that this stuff was not his. States that through his playing guitar he befriends  different people, and that these things were someone else.   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 rate 16, height 5\' 9"  (1.753 m), weight 76.658 kg (169 lb), SpO2 98.00%. Current Medications: Current Facility-Administered Medications  Medication Dose Route  Frequency Provider Last Rate Last Dose  . acetaminophen (TYLENOL) tablet 650 mg  650 mg Oral Q6H PRN Shuvon Rankin, NP      . alum & mag hydroxide-simeth (MAALOX/MYLANTA) 200-200-20 MG/5ML suspension 30 mL  30 mL Oral Q4H PRN Shuvon Rankin, NP      . ibuprofen (ADVIL,MOTRIN) tablet 200 mg  200 mg Oral Q6H PRN Rachael Fee, MD      . lithium carbonate capsule 450 mg  450 mg Oral BID WC Rachael Fee, MD   450 mg at 02/14/12 0802  . magnesium hydroxide (MILK OF MAGNESIA) suspension 30 mL  30 mL Oral Daily PRN Shuvon Rankin, NP      . multivitamin with minerals tablet 1 tablet  1 tablet Oral Daily Rachael Fee, MD   1 tablet at 02/14/12 0802  . QUEtiapine (SEROQUEL XR) 24 hr tablet 400 mg  400 mg Oral QHS Rachael Fee, MD   400 mg at 02/13/12 2232  . traZODone (DESYREL) tablet 200 mg  200 mg Oral QHS Rachael Fee, MD   200 mg at 02/13/12 2232  . DISCONTD: acetaminophen (TYLENOL) tablet 650 mg  650 mg Oral Q6H PRN Rachael Fee, MD      . DISCONTD: alum & mag hydroxide-simeth (MAALOX/MYLANTA) 200-200-20 MG/5ML suspension 30 mL  30 mL Oral Q4H PRN Rachael Fee, MD      . DISCONTD: magnesium hydroxide (  MILK OF MAGNESIA) suspension 30 mL  30 mL Oral Daily PRN Rachael Fee, MD        Lab Results: No results found for this or any previous visit (from the past 48 hour(s)).  Physical Findings: AIMS: Facial and Oral Movements Muscles of Facial Expression: None, normal Lips and Perioral Area: None, normal Jaw: None, normal Tongue: None, normal,Extremity Movements Upper (arms, wrists, hands, fingers): None, normal Lower (legs, knees, ankles, toes): None, normal, Trunk Movements Neck, shoulders, hips: None, normal, Overall Severity Severity of abnormal movements (highest score from questions above): None, normal Incapacitation due to abnormal movements: None, normal Patient's awareness of abnormal movements (rate only patient's report): No Awareness, Dental Status Current problems with teeth  and/or dentures?: No Does patient usually wear dentures?: No  CIWA:  CIWA-Ar Total: 0  COWS:  COWS Total Score: 1   Treatment Plan Summary: Daily contact with patient to assess and evaluate symptoms and progress in treatment Medication management  Plan:Supportive approach/coping skills          Get lithium level  Brynleigh Sequeira A 02/14/2012, 3:18 PM

## 2012-02-14 NOTE — Tx Team (Signed)
Interdisciplinary Treatment Plan Update (Adult)  Date:  02/14/2012  Time Reviewed:  10:02 AM   Progress in Treatment: Attending groups: Yes Participating in groups:  Yes Taking medication as prescribed: Yes Tolerating medication:  Yes Family/Significant othe contact made:  CSW will assess for appropriate contact Patient understands diagnosis:  Yes Discussing patient identified problems/goals with staff:  Yes Medical problems stabilized or resolved:  Yes Denies suicidal/homicidal ideation: Yes Issues/concerns per patient self-inventory:  None identified Other: N/A  New problem(s) identified: None Identified  Reason for Continuation of Hospitalization: Anxiety Depression Medication stabilization Withdrawal symptoms   Interventions implemented related to continuation of hospitalization: mood stabilization, medication monitoring and adjustment, group therapy and psycho education, safety checks q 15 mins  Additional comments: N/A  Estimated length of stay: 1-2 days  Discharge Plan: Pt will follow up at [facility] for medication management and therapy.    New goal(s): N/A  Review of initial/current patient goals per problem list:   1.  Goal (s): Reduce depressive and anxiety symptoms  Met:  No  Target date: by discharge  As evidenced by: Reducing depression from a 10 to a 3 as reported by pt.  Pt rates at a 0 today.   Attendees: Patient:   02/14/2012 9:55 AM   Family:     Physician:  Geoffery Lyons, MD 02/14/2012 9:55 AM   Nursing: Roswell Miners, RN 02/14/2012 9:55 AM   Clinical Social Worker:  Reyes Ivan, LCSWA 02/14/2012 9:55 AM   Other: Waynetta Sandy, RN 02/14/2012 9:55 AM   Other:  Dennison Nancy, RN 02/14/2012 9:56 AM   Other:  Celso Amy, NP 02/14/2012 9:56 AM   Other:  Bubba Camp, Psyc intern 02/14/2012 9:56 AM   Other:      Scribe for Treatment Team:   Reyes Ivan 02/14/2012 9:55 AM
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
## 2012-02-14 NOTE — Progress Notes (Signed)
  D) Patient pleasant and cooperative upon my assessment. Patient continues to exhibit some manic behavior including pacing and loud, rapid conversations at times. Patient states slept " well," and  appetite is "fair." Patient rates depression as   0/10, patient rates hopeless feelings as  0/10. Patient denies SI/HI, denies A/V hallucinations.   A) Patient offered support and encouragement, patient encouraged to discuss feelings/concerns with staff. Patient verbalized understanding. Patient monitored Q15 minutes for safety. Patient met with MD to discuss today's goals and plan of care.  R) Patient active on unit, attending groups in day room and meals in dining room. Patient insightful today, verbalizes a plan to "take meds as prescribed daily, exercise more, listen to music, play more guitar, walk, watch diet, and stay in touch with my parents daily."  Patient taking medications as ordered. Will continue to monitor.

## 2012-02-15 MED ORDER — LITHIUM CARBONATE 150 MG PO CAPS
450.0000 mg | ORAL_CAPSULE | Freq: Two times a day (BID) | ORAL | Status: DC
  Filled 2012-02-15 (×2): qty 84

## 2012-02-15 MED ORDER — LITHIUM CARBONATE 150 MG PO CAPS: 450.0000 mg | ORAL_CAPSULE | Freq: Two times a day (BID) | ORAL | Status: DC

## 2012-02-15 MED ORDER — CENTRUM SILVER ADULT 50+ PO TABS: 1.0000 | ORAL_TABLET | Freq: Every day | ORAL | Status: DC

## 2012-02-15 MED ORDER — TRAZODONE HCL 100 MG PO TABS: 200.0000 mg | ORAL_TABLET | Freq: Every day | ORAL | Status: DC

## 2012-02-15 MED ORDER — QUETIAPINE FUMARATE ER 400 MG PO TB24: 400.0000 mg | ORAL_TABLET | Freq: Every day | ORAL | Status: DC

## 2012-02-15 NOTE — Tx Team (Signed)
Interdisciplinary Treatment Plan Update (Adult)  Date:  02/15/2012  Time Reviewed:  10:08 AM   Progress in Treatment: Attending groups: Yes Participating in groups:  Yes Taking medication as prescribed: Yes Tolerating medication:  Yes Family/Significant othe contact made:  Yes Patient understands diagnosis:  Yes Discussing patient identified problems/goals with staff:  Yes Medical problems stabilized or resolved:  Yes Denies suicidal/homicidal ideation: Yes Issues/concerns per patient self-inventory:  None identified Other: N/A  New problem(s) identified: None Identified  Reason for Continuation of Hospitalization: Stable to d/c  Interventions implemented related to continuation of hospitalization: Stable to d/c  Additional comments: N/A  Estimated length of stay: D/C today  Discharge Plan: Pt will follow up with [facility] for medication management and therapy.    New goal(s): N/A  Review of initial/current patient goals per problem list:    1.  Goal(s): Address substance use  Met:  Yes  Target date: by discharge  As evidenced by: completed detox protocol and referred to appropriate treatment  2.  Goal (s): Reduce depressive and anxiety symptoms  Met:  Yes  Target date: by discharge  As evidenced by: Reducing depression from a 10 to a 3 as reported by pt.  Pt rates at a 0 today.   3.  Goal(s): Eliminate SI  Met:  Yes  Target date: by discharge  As evidenced by: Pt denies SI.    Attendees: Patient:   02/15/2012 10:08 AM   Family:     Physician:  Geoffery Lyons, MD 02/15/2012 10:08 AM   Nursing:  02/15/2012 10:08 AM   Clinical Social Worker:  Reyes Ivan, LCSWA 02/15/2012 10:08 AM   Other:  02/15/2012 10:08 AM   Other:     Other:     Other:     Other:      Scribe for Treatment Team:   Reyes Ivan 02/15/2012 10:08 AM

## 2012-02-15 NOTE — [facility] Suicide Risk Assessment (Signed)
Suicide Risk Assessment  Discharge Assessment     Demographic Factors:  Divorced or widowed and Caucasian  Mental Status Per Nursing Assessment::   On Admission:   (denies si/hi/av )  Current Mental Status by Physician: In full contact with reality. Mood euthymic. Lithium level .64. Aware that he needs to comply with his medications. Denies any suicidal ideas, plans or intent.   Loss Factors: Loss of significant relationship  Historical Factors: NA  Risk Reduction Factors:   Responsible for children under 89 years of age, Sense of responsibility to family, Employed and Positive social support  Continued Clinical Symptoms: Bipolar Disorder   Cognitive Features That Contribute To Risk: None   Suicide Risk:  Minimal: No identifiable suicidal ideation.  Patients presenting with no risk factors but with morbid ruminations; may be classified as minimal risk based on the severity of the depressive symptoms  Discharge Diagnoses:   AXIS I:  Bipolar, Manic AXIS II:  Deferred AXIS III:   Past Medical History  Diagnosis Date  . Bipolar 1 disorder    AXIS IV:  None Currently AXIS V:  61-70 mild symptoms  Plan Of Care/Follow-up recommendations:  Activity:  As tolerated Diet:  Regular Will follow up with [facility] Is patient on multiple antipsychotic therapies at discharge:  No   Has Patient had three or more failed trials of antipsychotic monotherapy by history:  No  Recommended Plan for Multiple Antipsychotic Therapies: N/A   Briyonna Omara A 02/15/2012, 9:15 AM

## 2012-02-15 NOTE — Progress Notes (Signed)
[facility] Case Management Discharge Plan:  Will you be returning to the same living situation after discharge: Yes,  returning home At discharge, do you have transportation home?:Yes,  access to transportation Do you have the ability to pay for your medications:Yes,  access to meds   Release of information consent forms completed and in the chart;  Patient's signature needed at discharge.  Patient to Follow up at:  Follow-up Information    Follow up with [facility]. On 02/16/2012. (Appointment scheduled at 2:15 pm with Brand Males, NP)    Contact information:   [address] [place], [place] [address] [phone]         Patient denies SI/HI:   Yes,  denies SI/HI    Safety Planning and Suicide Prevention discussed:  Yes,  discussed with pt  Barrier to discharge identified:No.  Summary and Recommendations: Pt attended discharge planning group and actively participated in group.  SW provided pt with today's workbook.  Pt presents with calm mood and affect.  Pt rates depression and anxiety at a 0 today.  Pt reports feeling stable to d/c.Marland Kitchen  No recommendations from SW.  No further needs voiced by pt.  Pt stable to discharge.     Carmina Miller 02/15/2012, 9:59 AM

## 2012-02-15 NOTE — Progress Notes (Signed)
Patient denies SI/HI, denies A/V hallucinations. Patient verbalizes understanding of discharge instructions, follow up care and prescriptions. Patient given all belongings from [facility] locker. Patient escorted out by staff, transported by family. 

## 2012-02-15 NOTE — Progress Notes (Signed)
Psychoeducational Group Note  Date:  02/15/2012 Time:  1000  Group Topic/Focus:  Wellness Toolbox:   The focus of this group is to discuss various aspects of wellness, balancing those aspects and exploring ways to increase the ability to experience wellness.  Patients will create a wellness toolbox for use upon discharge.  Participation Level:  Active  Participation Quality:  Appropriate, Attentive, Sharing and Supportive  Affect:  Appropriate  Cognitive:  Alert and Appropriate  Insight:  Good  Engagement in Group:  Good  Additional Comments:   Productive group  Earline Mayotte 02/15/2012, 6:32 PM

## 2012-02-15 NOTE — Progress Notes (Signed)
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 Marki was active and insightful while group discussed what labels are, how we use them, how they effect our way of thinking, and listed positive and negative labels they have had. Fount discussed how he has lost relationships due to people making assumptions when they hear about his diagnosis. Zacchary was given a homework assignment to list 10 ways he has been labeled and to find the reality of the situation/label.    Wandra Scot 02/15/2012, 3:55 PM

## 2012-02-15 NOTE — Progress Notes (Signed)
Patient ID: [name], male   DOB: 1960/04/24, 51 y.o.   MRN: [id number] D: Pt. Reports "waiting to get Lithium in my system, waiting for the blood work to come back, hopefully get go home."  Pt. Reports groups been good "they said a lot of nice things." A: Writer provider support, encouraged pt. To take meds as prescribed. Staff encouraged pt. To attend group. Staff will monitor q25min for safety.

## 2012-02-20 NOTE — Progress Notes (Signed)
Patient Discharge Instructions:  After Visit Summary (AVS):   Faxed to:  02/20/12 Psychiatric Admission Assessment Note:   Faxed to:  02/20/12 Suicide Risk Assessment - Discharge Assessment:   Faxed to:  02/20/12 Faxed/Sent to the Next Level Care provider:  02/20/12 Faxed to [facility] @ [phone]  Jerelene Redden, 02/20/2012, 2:55 PM

## 2012-02-20 NOTE — Discharge Summary (Signed)
Patient:  [name] is an 51 y.o., male MRN:  [id number] DOB:  [birth year] Patient phone:  [phone] (home)  Patient address:   [address] [place] [address]   Date of Admission:  02/11/2012 Date of Discharge: 02/15/2012  Discharge Diagnoses: Principal Problem:  *Bipolar 1 disorder  Axis Diagnosis:  Discharge Diagnoses:  AXIS I: Bipolar, Manic  AXIS II: Deferred  AXIS III:  Past Medical History   Diagnosis  Date   .  Bipolar 1 disorder    AXIS IV: None Currently  AXIS V: 61-70 mild symptoms  Level of Care:  OP  Hospital Course:  [name] was admitted for crisis management and stabilization of his symptoms of Bipolar disorder which included mania, mood swings, poor sleep, distractibility, elevated mood, flight of ideas, mood lability, and excessive worry.  Medication management included increasing his lithium dose and adding Seroquel.  He was evaluated by the treatment team and a plan of care was initiated.  [name] was also involved in unit programming and met with an Network engineer.  His symptoms resolved as his medication was titrated to the current dose. as needed.      The symptoms of mania were monitored daily by evaluation by clinical provider.  The patient's mental and emotional status was evaluated by a daily self inventory completed by the patient. Improvement was demonstrated by declining numbers on the self assessment, improving vital signs, increased cognition, and improvement in mood, sleep, appetite as well as a reduction in physical symptoms.        The patient was evaluated and found to be stable enough for discharge and was released to home per the initial plan of treatment.   Mental Status Exam:  For mental status exam please see mental status exam and  suicide risk assessment completed by attending physician prior to discharge. Medication List   As of 02/20/2012  5:39 PM    STOP taking these medications         ibuprofen 200 MG tablet     Commonly known as: ADVIL,MOTRIN      lithium carbonate 450 MG CR tablet   Commonly known as: ESKALITH      oxymetazoline 0.05 % nasal spray   Commonly known as: AFRIN      VISINE OP      TAKE these medications      Indication    CENTRUM SILVER ADULT 50+ Tabs   Take 1 tablet by mouth daily.    Indication: nutritional supplements      lithium carbonate 150 MG capsule   Take 3 capsules (450 mg total) by mouth 2 (two) times daily with a meal.    Indication: Manic-Depression      QUEtiapine 400 MG 24 hr tablet   Commonly known as: SEROQUEL XR   Take 1 tablet (400 mg total) by mouth at bedtime.    Indication: Manic-Depression      traZODone 100 MG tablet   Commonly known as: DESYREL   Take 2 tablets (200 mg total) by mouth at bedtime.    Indication: Trouble Sleeping       Follow-up Information    Follow up with [facility]. On 02/16/2012. (Appointment scheduled at 2:15 pm with Brand Males, NP)    Contact information:   [address], [place] [address] [phone]    Upon discharge, patient adamantly denies suicidal, homicidal ideations, auditory, visual hallucinations and or delusional thinking. They left [facility] with all personal belongings via personal transportation in  no apparent distress.  Consults:  none Significant Diagnostic Studies:  none Discharge Vitals:   Blood pressure 126/90, pulse 96, temperature 98.4 F (36.9 C), temperature source Oral, resp. rate 16, height 5\' 9"  (1.753 m), weight 76.658 kg (169 lb), SpO2 98.00%..  Mental Status Exam: See Mental Status Examination and Suicide Risk Assessment completed by Attending Physician prior to discharge.  Discharge destination:  home  Is patient on multiple antipsychotic therapies at discharge:  No  Has Patient had three or more failed trials of antipsychotic monotherapy by history: N/A Recommended Plan for Multiple Antipsychotic Therapies: N/A Discharge Orders    Future Orders Please Complete By Expires    Diet - low sodium heart healthy      Increase activity slowly      Discharge instructions      Comments:   Take all of your medications as prescribed.  Be sure to keep ALL follow up appointments as scheduled. This is to ensure getting your refills on time to avoid any interruption in your medication.  If you find that you can not keep your appointment, call the clinic and reschedule. Be sure to tell the nurse if you will need a refill before your appointment.      Follow-up Information    Follow up with [facility]. On 02/16/2012. (Appointment scheduled at 2:15 pm with Brand Males, NP)    Contact information:   [address], [place] [address] [phone]   Follow-up recommendations:   Activities: Resume typical activities Diet:         Resume typical diet Tests:       You will need to follow up with repeat lithium level  Other:       Follow up with outpatient provider and report any side effects to out patient prescriber.  Comments:  Take all your medications as prescribed by your mental healthcare provider. Report any adverse effects and or reactions from your medicines to your outpatient provider promptly. Patient is instructed and cautioned to not engage in alcohol and or illegal drug use while on prescription medicines. In the event of worsening symptoms, patient is instructed to call the crisis hotline, 911 and or go to the nearest ED for appropriate evaluation and treatment of symptoms.  Signed:  Rona Ravens. [facility] 02/20/2012 5:39 PM

## 2012-02-25 NOTE — Discharge Summary (Signed)
Agree with assessment and plan Ramonte Mena A. Carollyn Etcheverry, M.D. 

## 2012-03-29 ENCOUNTER — Emergency Department (HOSPITAL_COMMUNITY)
Admission: EM | Admit: 2012-03-29 | Discharge: 2012-03-29 | Disposition: A | Discharge status: Other Acute Inpatient Hospital | Payer: Self-pay | Attending: Emergency Medicine | Admitting: Emergency Medicine

## 2012-03-29 ENCOUNTER — Inpatient Hospital Stay (HOSPITAL_COMMUNITY)
Admission: RE | Admit: 2012-03-29 | Discharge: 2012-04-19 | DRG: 885 | Disposition: A | Discharge status: Home / Self Care | Payer: Federal, State, Local not specified - Other | Attending: Psychiatry | Admitting: Psychiatry

## 2012-03-29 ENCOUNTER — Other Ambulatory Visit: Payer: Self-pay

## 2012-03-29 ENCOUNTER — Encounter (HOSPITAL_COMMUNITY): Payer: Self-pay | Admitting: *Deleted

## 2012-03-29 DIAGNOSIS — Z79899 Other long term (current) drug therapy: Secondary | ICD-10-CM | POA: Insufficient documentation

## 2012-03-29 DIAGNOSIS — F314 Bipolar disorder, current episode depressed, severe, without psychotic features: Secondary | ICD-10-CM

## 2012-03-29 DIAGNOSIS — F3130 Bipolar disorder, current episode depressed, mild or moderate severity, unspecified: Principal | ICD-10-CM | POA: Diagnosis present

## 2012-03-29 DIAGNOSIS — F319 Bipolar disorder, unspecified: Secondary | ICD-10-CM

## 2012-03-29 LAB — CBC
HCT: 42.5 % (ref 39.0–52.0)
Hemoglobin: 15.1 g/dL (ref 13.0–17.0)
MCH: 31 pg (ref 26.0–34.0)
MCV: 87.3 fL (ref 78.0–100.0)
Platelets: 333 10*3/uL (ref 150–400)
RBC: 4.87 MIL/uL (ref 4.22–5.81)
WBC: 12.5 10*3/uL — ABNORMAL HIGH (ref 4.0–10.5)

## 2012-03-29 LAB — COMPREHENSIVE METABOLIC PANEL
AST: 13 U/L (ref 0–37)
BUN: 16 mg/dL (ref 6–23)
CO2: 24 mEq/L (ref 19–32)
Chloride: 100 mEq/L (ref 96–112)
Creatinine, Ser: 1.24 mg/dL (ref 0.50–1.35)
GFR calc Af Amer: 77 mL/min — ABNORMAL LOW (ref >90)
GFR calc non Af Amer: 67 mL/min — ABNORMAL LOW (ref >90)
Glucose, Bld: 94 mg/dL (ref 70–99)
Total Bilirubin: 0.6 mg/dL (ref 0.3–1.2)

## 2012-03-29 LAB — LITHIUM LEVEL: Lithium Lvl: 0.87 mEq/L (ref 0.80–1.40)

## 2012-03-29 LAB — RAPID URINE DRUG SCREEN, HOSP PERFORMED: Amphetamines: NOT DETECTED

## 2012-03-29 LAB — SALICYLATE LEVEL: Salicylate Lvl: <2 mg/dL — ABNORMAL LOW (ref 2.8–20.0)

## 2012-03-29 LAB — CALCIUM: Calcium: 10.3 mg/dL (ref 8.4–10.5)

## 2012-03-29 MED ORDER — LITHIUM CARBONATE 300 MG PO CAPS
450.0000 mg | ORAL_CAPSULE | Freq: Two times a day (BID) | ORAL | Status: DC
  Administered 2012-03-29: 450 mg via ORAL
  Filled 2012-03-29 (×3): qty 1

## 2012-03-29 MED ORDER — SODIUM CHLORIDE 0.9 % IV BOLUS (SEPSIS)
1000.0000 mL | Freq: Once | INTRAVENOUS | Status: AC
  Administered 2012-03-29: 1000 mL via INTRAVENOUS

## 2012-03-29 MED ORDER — ACETAMINOPHEN 325 MG PO TABS: 650.0000 mg | ORAL_TABLET | ORAL | Status: DC | PRN

## 2012-03-29 MED ORDER — TRAZODONE HCL 100 MG PO TABS: 200.0000 mg | ORAL_TABLET | Freq: Every day | ORAL | Status: DC

## 2012-03-29 MED ORDER — QUETIAPINE FUMARATE ER 400 MG PO TB24
400.0000 mg | ORAL_TABLET | Freq: Every day | ORAL | Status: DC
  Filled 2012-03-29: qty 1

## 2012-03-29 NOTE — ED Provider Notes (Signed)
Patient here for "medical clearance." History of bipolar disorder. Patient is sleepy arouses to verbal stimulus follow simple commands, ambulates without difficulty. Mild hypercalcemia  noted on lab work. Will hydrate and recheck patient signed out to Dr.Pickering at 430 pm  Doug Sou, MD 03/29/12 1636

## 2012-03-29 NOTE — BH Assessment (Signed)
[facility] Assessment Progress Note      Pt accepted to [facility] by Shuvon Rankin-NP-assigned to bed 406-2(Dr. Akintayo). All appropriate support documentation complete and faxed to [facility] Dr. Rubin Payor consulted and is in agreement with disposition. Pt's nurse notified and agreed to call report prior to transfer to [facility].

## 2012-03-29 NOTE — ED Notes (Signed)
Pt reports increased depression, flat affect. Brought over by [facility], accepted, but bed not available. Denies HI/SI. States is taking medications as prescribed.

## 2012-03-29 NOTE — ED Notes (Signed)
Pt admitted to psych ED requesting help with depression.  He does not give detailed answers when asked about stressors that triggered his depression.  Rather, he shrugs and makes a gutteral sound. He indicated that he was not suicidal nor having problems with substance abuse.  He appears emotionally detached with a flat affect.  Unit policies reviewed. Will cont to monitor.

## 2012-03-29 NOTE — ED Notes (Signed)
Pt transferred to [facility] for IVF due to elevated calcium. NS running to gravity to R ac peripheral IV. Rose Fillers RN aware. Repeat calcium to be drawn at 1930.

## 2012-03-29 NOTE — ED Provider Notes (Signed)
  Physical Exam  BP 150/89  Pulse 63  Temp 99.3 F (37.4 C) (Oral)  Resp 18  SpO2 97%  Physical Exam  ED Course  Procedures  MDM Accepted at [facility]. Rankin NP.    Date: 03/29/2012  Rate: 61  Rhythm: normal sinus rhythm  QRS Axis: right  Intervals: normal  ST/T Wave abnormalities: nonspecific ST/T changes  Conduction Disutrbances:none  Narrative Interpretation: RAD is new.  Old EKG Reviewed: changes noted     Juliet Rude. Rubin Payor, MD 03/29/12 [phone]

## 2012-03-29 NOTE — BH Assessment (Signed)
Assessment Note   [name] is an 51 y.o. male. Brought into assessment office by his parents, father is also his legal guardian and papers are on chart. He is catatonic and non verbal, only made groaning noises when he was spoken to. No eye contact and did not move during the interview but is able to get up on own when needed to exit room. He is un showered but appropriately dressed. He is living with his Mother and she was present for evaluation along with his Father who did most of the talking. He states 3-4 weeks ago he was manic and spent 12,000 dollars. He bought a 1500 dog with no place to put it and rented a storage building with nothing to put in it He had been living in motels and most of his stuff was stolen or thrown out when he was jailed and manic from the hotel.Marland KitchenHe is know to drink ETOH when he is manic but no known drug use and not drinking  now that he is depressed. During this time of mania he made harassing phone calls to his previous boss and was arrested and spent three days in jail. He was put on probation but father states since that time is very fearful and paranoid he will be put back in jail or that the police are coming after him. He was here at [name] the end of Oct and Mother states continues to take his medications as prescribed, Seroquel, Trazadone, and Lithium. Father thinks there may be more to his dx than bipolar and needs an antidepressant. Father states his behavior is different now in that he is lying and deceptive and he has not been these things before. He has had multiple losses in past 5-6 years. His wife committed suicide and he tried to care for his two children but 5-6 years ago he lost custody of them. He also has lost his house and his job. He is currently working part time for [name]. He had formerly worked at a [employer] in [place]. He is not receiving disability but father says they are trying for it again this time with the help of an attorney. He has become  very depressed and catatonic in the last four days. He is not eating or sleeping, not speaking or caring for self. He reportedly told father that he had nohting to live for anymore and father became afraid he would hurt himself and states he has tried to kill self in the past. He continues to get outpatient services thru [name].It is not reported that he is hallucinating and there is no evidence he is. Consulted with [name] and agreed he would be sent across the street to [name] for medical clearance and will be admitted here at [name] this afternoon after med clearance and when there is a bed available to him on the 400 hall.   Axis I: Bipolar, Depressed Axis II: Deferred Axis III:  Past Medical History  Diagnosis Date  . Bipolar 1 disorder    Axis IV: economic problems, occupational problems and other psychosocial or environmental problems Axis V: 21-30 behavior considerably influenced by delusions or hallucinations OR serious impairment in judgment, communication OR inability to function in almost all areas  Past Medical History:  Past Medical History  Diagnosis Date  . Bipolar 1 disorder     No past surgical history on file.  Family History: No family history on file.  Social History:  reports that he has never smoked.  He has never used smokeless tobacco. He reports that he drinks about 10.8 ounces of alcohol per week. He reports that he does not use illicit drugs.  Additional Social History:  Alcohol / Drug Use Pain Medications: not abusing Prescriptions: not abusing Over the Counter: not abusing History of alcohol / drug use?: Yes Substance #1 Name of Substance 1: alcohol 1 - Age of First Use: unknown 1 - Amount (size/oz): unknown  1 - Frequency: drinks when he is manic or at baseline, not drinking currently 1 - Duration: father uncertain of specifics of his use 1 - Last Use / Amount: 2-3 weeks ago  CIWA:   COWS:    Allergies:  Allergies  Allergen Reactions  . Medrol  (Methylprednisolone) Rash    Home Medications:  (Not in a [name] admission)  OB/GYN Status:  No LMP for male patient.  General Assessment Data Location of Assessment: [name] Living Arrangements: Parent (with Mother) Can pt return to current living arrangement?: Yes Admission Status: Voluntary Is patient capable of signing voluntary admission?: Yes Transfer from: Home Referral Source: Self/Family/Friend  Education Status Is patient currently in school?: No  Risk to self Suicidal Ideation: Yes-Currently Present Suicidal Intent: No Is patient at risk for suicide?: Yes Suicidal Plan?: No Access to Means: No What has been your use of drugs/alcohol within the last 12 months?: none currently but father reports he drinks ETOH when manic Previous Attempts/Gestures: Yes (father reports he tried to kill self before but no specifics) How many times?:  (unknown) Other Self Harm Risks: none Triggers for Past Attempts: Unknown Intentional Self Injurious Behavior: None Family Suicide History: No Recent stressful life event(s):  (multiple losses in last five to six years, mental illness) Persecutory voices/beliefs?: No Depression: Yes Depression Symptoms: Despondent;Insomnia;Isolating;Fatigue;Loss of interest in usual pleasures;Feeling worthless/self pity Substance abuse history and/or treatment for substance abuse?: No Suicide prevention information given to non-admitted patients: Not applicable  Risk to Others Homicidal Ideation: No Thoughts of Harm to Others: No Current Homicidal Intent: No Current Homicidal Plan: No Access to Homicidal Means: No History of harm to others?: No Assessment of Violence: None Noted Does patient have access to weapons?: No Criminal Charges Pending?: No Does patient have a court date: Yes (unclear but he did spend three days in jail Nov 6th ) Court Date:  (unknown)  Psychosis Hallucinations: None noted Delusions: None  noted  Mental Status Report Appear/Hygiene:  (catatonic) Eye Contact: Poor Motor Activity: Rigidity (catatonic) Speech:  (mute except for moans) Level of Consciousness: Alert Mood: Depressed Affect: Blunted Anxiety Level: None Thought Processes:  (non verbal but Father states he is worried and paranoid) Judgement: Impaired Orientation: Person Obsessive Compulsive Thoughts/Behaviors: None  Cognitive Functioning Concentration: Decreased Memory: Recent Intact;Remote Intact (not verbalizing) IQ: Average Insight: Poor Impulse Control: Good (catatonic unresponsive and not moving) Appetite: Poor Weight Loss: 0  Weight Gain: 0  Sleep: Decreased Total Hours of Sleep:  (unknown) Vegetative Symptoms: Decreased grooming;Not bathing  ADLScreening [name]) Patient's cognitive ability adequate to safely complete daily activities?: Yes Patient able to express need for assistance with ADLs?: No Independently performs ADLs?: Yes (appropriate for developmental age) (will need assist)  Abuse/Neglect [name]) Physical Abuse: Denies Verbal Abuse: Denies Sexual Abuse: Denies  Prior Inpatient Therapy Prior Inpatient Therapy: Yes Prior Therapy Dates: [name] 02/12/2012 Prior Therapy Facilty/Provider(s): [facility] Reason for Treatment: bipolar disorder  Prior Outpatient Therapy Prior Outpatient Therapy: Yes Prior Therapy Dates: current Prior Therapy Facilty/Provider(s): [facility] Reason for Treatment: Bipolar  ADL  Screening (condition at time of admission) Patient's cognitive ability adequate to safely complete daily activities?: Yes Patient able to express need for assistance with ADLs?: No Independently performs ADLs?: Yes (appropriate for developmental age) (will need assist) Weakness of Legs: None Weakness of Arms/Hands: None  Home Assistive Devices/Equipment Home Assistive Devices/Equipment: None    Abuse/Neglect Assessment (Assessment to be complete while patient is  alone) Physical Abuse: Denies Verbal Abuse: Denies Sexual Abuse: Denies Exploitation of patient/patient's resources: Denies Self-Neglect: Denies Values / Beliefs Cultural Requests During Hospitalization: None Spiritual Requests During Hospitalization: None   Advance Directives (For Healthcare) Advance Directive: Patient does not have advance directive Pre-existing out of facility DNR order (yellow form or pink MOST form): No Nutrition Screen- [facility] Adult/WL/AP Have you recently lost weight without trying?: No Have you been eating poorly because of a decreased appetite?: No Malnutrition Screening Tool Score: 0   Additional Information 1:1 In Past 12 Months?: No CIRT Risk: No Elopement Risk: No Does patient have medical clearance?:  (send over to [name] for med clearance before admit to [address] hall)     Disposition:  Disposition Disposition of Patient: Inpatient treatment program Type of inpatient treatment program: Adult  On Site Evaluation by:   Reviewed with Physician:     Wynona Luna 03/29/2012 12:32 PM

## 2012-03-29 NOTE — ED Provider Notes (Signed)
History     CSN: [id number]  Arrival date & time 03/29/12  1126   First MD Initiated Contact with Patient 03/29/12 1128      Chief Complaint  Patient presents with  . Medical Clearance    (Consider location/radiation/quality/duration/timing/severity/associated sxs/prior treatment) The history is provided by the patient. No language interpreter was used.   Cc:  Patient is here for medical clearance from [facility].  pmh of bipolar with admission 02/11/12.  Has been going to [facility] outpatient.  Slightly catatonic with no eye contact.  Answers simple yes and no questions.  Has not slept in 1 week.  Has not missed any medications.  Poor appetite poor concentration, slow to answer, difficult thought process.  Reports manic x 1 week. Lives with mom who dropped him off. Denies suicidal or homocidal thoughts/ideations.  Denies etoh and drugs.  Does not smoke.     Past Medical History  Diagnosis Date  . Bipolar 1 disorder     History reviewed. No pertinent past surgical history.  No family history on file.  History  Substance Use Topics  . Smoking status: Never Smoker   . Smokeless tobacco: Never Used  . Alcohol Use: 10.8 oz/week    18 Cans of beer per week     Comment: Pt verbalized that he consumes alcohol       Review of Systems  Constitutional: Negative.   HENT: Negative.   Eyes: Negative.   Respiratory: Negative.   Cardiovascular: Negative.   Gastrointestinal: Negative.   Skin: Negative.   Neurological: Negative.   Psychiatric/Behavioral: Positive for sleep disturbance and decreased concentration. Negative for suicidal ideas. The patient is not nervous/anxious.   All other systems reviewed and are negative.    Allergies  Medrol  Home Medications   Current Outpatient Rx  Name  Route  Sig  Dispense  Refill  . LITHIUM CARBONATE 150 MG PO CAPS   Oral   Take 3 capsules (450 mg total) by mouth 2 (two) times daily with a meal.   180 capsule   0   .  CENTRUM SILVER ADULT 50+ PO TABS   Oral   Take 1 tablet by mouth daily.         . QUETIAPINE FUMARATE ER 400 MG PO TB24   Oral   Take 1 tablet (400 mg total) by mouth at bedtime.   30 tablet   0   . TRAZODONE HCL 100 MG PO TABS   Oral   Take 2 tablets (200 mg total) by mouth at bedtime.   60 tablet   0     BP 152/99  Pulse 80  Temp 98.6 F (37 C)  Resp 20  SpO2 98%  Physical Exam  Nursing note and vitals reviewed. Constitutional: He is oriented to person, place, and time. He appears well-developed and well-nourished.  HENT:  Head: Normocephalic.  Eyes: Conjunctivae normal and EOM are normal. Pupils are equal, round, and reactive to light.  Neck: Normal range of motion. Neck supple.  Cardiovascular: Normal rate.   Pulmonary/Chest: Effort normal.  Abdominal: Soft. Bowel sounds are normal. He exhibits no distension.  Musculoskeletal: Normal range of motion.  Neurological: He is alert and oriented to person, place, and time.  Skin: Skin is warm and dry.  Psychiatric: His speech is delayed. He is withdrawn. He is not actively hallucinating. Thought content is not paranoid and not delusional. Cognition and memory are normal. He expresses no homicidal and no suicidal ideation. He expresses  no suicidal plans and no homicidal plans.       Hearing no voices or seeing no hallucinations Oriented x 3  He is inattentive.    ED Course  Procedures (including critical care time)   Labs Reviewed  ACETAMINOPHEN LEVEL  CBC  COMPREHENSIVE METABOLIC PANEL  ETHANOL  SALICYLATE LEVEL  URINE RAPID DRUG SCREEN (HOSP PERFORMED)   No results found.   No diagnosis found.    MDM   51yo male pmh of bipolar on lithium, seroquil and trazadone here for medical clearance from Behavior [facility].  Recent admission on 02/11/12 for the same.  No sleep in 1 week.  Poor apetitie, withdrawn, cooperative.  Has been accepted to [facility] when bed open.  Denies etoh or drugs.  Does not  smoke. Holding orders in.  Home meds ordered.   1600  EKG, NS bolus and repeat calcium level pending. ( calcium 11.2)  Report given to Dr. Rubin Payor.  Awaiting bed at [facility].        Remi Haggard, NP 03/29/12 [phone]

## 2012-03-30 ENCOUNTER — Encounter (HOSPITAL_COMMUNITY): Payer: Self-pay | Admitting: *Deleted

## 2012-03-30 MED ORDER — ACETAMINOPHEN 325 MG PO TABS: 650.0000 mg | ORAL_TABLET | Freq: Four times a day (QID) | ORAL | Status: DC | PRN

## 2012-03-30 MED ORDER — MAGNESIUM HYDROXIDE 400 MG/5ML PO SUSP
30.0000 mL | Freq: Every day | ORAL | Status: DC | PRN
  Administered 2012-04-13: 30 mL via ORAL

## 2012-03-30 MED ORDER — TRAZODONE HCL 100 MG PO TABS
100.0000 mg | ORAL_TABLET | Freq: Every evening | ORAL | Status: DC | PRN
  Administered 2012-03-30 – 2012-04-08 (×3): 100 mg via ORAL
  Filled 2012-03-30 (×2): qty 1
  Filled 2012-03-30: qty 14
  Filled 2012-03-30: qty 1

## 2012-03-30 MED ORDER — QUETIAPINE FUMARATE ER 400 MG PO TB24
400.0000 mg | ORAL_TABLET | Freq: Every day | ORAL | Status: DC
  Administered 2012-03-30 – 2012-03-31 (×2): 400 mg via ORAL
  Filled 2012-03-30 (×3): qty 1

## 2012-03-30 MED ORDER — LITHIUM CARBONATE 300 MG PO CAPS
450.0000 mg | ORAL_CAPSULE | Freq: Two times a day (BID) | ORAL | Status: DC
  Administered 2012-03-30 – 2012-04-01 (×5): 450 mg via ORAL
  Filled 2012-03-30 (×7): qty 1

## 2012-03-30 MED ORDER — ALUM & MAG HYDROXIDE-SIMETH 200-200-20 MG/5ML PO SUSP: 30.0000 mL | ORAL | Status: DC | PRN

## 2012-03-30 NOTE — Progress Notes (Signed)
Psychoeducational Group Note  Date:  03/30/2012 Time:  2000  Group Topic/Focus:  Wrap-Up Group:   The focus of this group is to help patients review their daily goal of treatment and discuss progress on daily workbooks.  Participation Level: Did Not Attend  Participation Quality:  Not Applicable  Affect:  Not Applicable  Cognitive:  Not Applicable  Insight:  Not Applicable  Engagement in Group: Not Applicable  Additional Comments:  The patient was admitted to the hallway following the conclusion of group.   Hazle Coca S 03/30/2012, 12:32 AM

## 2012-03-30 NOTE — Progress Notes (Signed)
Patient ID: [name], male   DOB: 1961-02-07, 51 y.o.   MRN: [id number]  S:   Seen today. Sleepy on bed. No interested to talk today on repeated efforts. Kept his eyes closed most of the time while interviewing.   Mental Status Examination/Evaluation:  Objective: Appearance: Fairly Groomed   Patent attorney:: poor  Speech: slow  Volume: Normal   Mood: Anxious and Irritable   Affect: ristricted  Thought Process: Coherent, Goal Directed  Orientation: Full   Thought Content: no avh  Suicidal Thoughts: No   Homicidal Thoughts: No   Memory: Immediate; Fair  Recent; Fair  Remote; Fair   Judgement: poor  Insight: Poor  Psychomotor Activity: Increased   Concentration: Fair   Recall: poor  Akathisia: No   Handed: Right   AIMS (if indicated):   Assets: Communication Skills  Desire for Improvement  Social Support   Sleep: Number of Hours: 6.5    Laboratory/X-Ray  Psychological Evaluation(s)      Assessment:  AXIS I: Bipolar I Diorder  AXIS II: Deferred  AXIS III:  Past Medical History   Diagnosis  Date   .  Bipolar 1 disorder     AXIS IV: housing problems, occupational problems and problems with primary support group  AXIS V: 51-60 moderate symptoms  Treatment Plan/Recommendations:   Treatment Plan Summary:    Continue current meds

## 2012-03-30 NOTE — Progress Notes (Signed)
Thought pt was getting fluids via IV then coming back to the psych ed, thought his belongings were in locker 34 but they aren't. Looks like he was transferred to [facility] so we need to clean 34 and get a bed for the room as well.

## 2012-03-30 NOTE — Tx Team (Signed)
Initial Interdisciplinary Treatment Plan  PATIENT STRENGTHS: (choose at least two) Average or above average intelligence Supportive family/friends  PATIENT STRESSORS: Financial difficulties Legal issue   PROBLEM LIST: Problem List/Patient Goals Date to be addressed Date deferred Reason deferred Estimated date of resolution  Depression 03/29/12                                                      DISCHARGE CRITERIA:  Ability to meet basic life and health needs Adequate post-discharge living arrangements Improved stabilization in mood, thinking, and/or behavior Verbal commitment to aftercare and medication compliance  PRELIMINARY DISCHARGE PLAN: Outpatient therapy Return to previous living arrangement Return to previous work or school arrangements  PATIENT/FAMIILY INVOLVEMENT: This treatment plan has been presented to and reviewed with the patient, Marc, [name] 03/30/2012, 3:38 AM

## 2012-03-30 NOTE — Progress Notes (Signed)
Patient ID: [name], male   DOB: [birth year], 51 y.o.   MRN: [id number] D: Pt. states he is doing all right, but accepted HS med. for sleep. A: Pt. denies lethality and A/V/H's: Pt. has a flat to sad affect, but states he doesn't sleep well without medication.  Pt.took his med and went to bed. R: Will continue to monitor for changes.

## 2012-03-30 NOTE — Progress Notes (Signed)
D   Pt is catatonic and barely answered questions asked of him this morning  He answered with mumbles and grunts  He took medications without difficulty and went back to his room   He does not interact or socialize with others he isolates to his room   A   Verbal support given  Medications administered and effectiveness monitored   Q 15 min checks  Encourage pt to stay out on the milue and in the dayroom more R   Pt safe at present

## 2012-03-30 NOTE — Progress Notes (Signed)
Patient ID: [name], male   DOB: 10-Dec-1960, 51 y.o.   MRN: [id number] This is a voluntary admission of a 51 y.o. W/W/M with a Dx of Bipolar 1 D/O. The patient presents in a depressed, slowed up state and is somewhat electively mute. When he chooses to answer a question, he mostly just grunts. Occasionally he responded with a yes or no. The patient is a poor historian and difficult to assess at this point. Admission history will need to be reviewed and possibly updated when the patient is in a better state of mind. His grunts seem to indicate that he denies any present substance abuse or A/V hallucinations. He verbally denied suicidal ideation. He was unable to provide information as to what led to this admission. Indicates that he has been compliant with his medications.

## 2012-03-30 NOTE — Progress Notes (Signed)
Psychoeducational Group Note  Date:  03/30/2012 Time:0915am  Group Topic/Focus:  Identifying Needs:   The focus of this group is to help patients identify their personal needs that have been historically problematic and identify healthy behaviors to address their needs.  Participation Level:  Did Not Attend  Participation Quality:    Affect:   Cognitive:   Insight:    Engagement in Group:    Additional Comments:  Inventory group   [name] 03/30/2012,10:14 AM

## 2012-03-30 NOTE — Clinical Social Work Note (Signed)
[facility] Group Notes: (Clinical Social Work)   03/30/2012   11:15-11:45am   Type of Therapy:  Group Therapy   Participation Level:  Did Not Attend    Ambrose Mantle, LCSW 03/30/2012, 1:04 PM

## 2012-03-30 NOTE — Progress Notes (Signed)
Psychoeducational Group Note  Date:  03/30/2012 Time:  2000  Group Topic/Focus:  Wrap-Up Group:   The focus of this group is to help patients review their daily goal of treatment and discuss progress on daily workbooks.  Participation Level:  None  Participation Quality:  none  Affect:  Depressed  Cognitive:  Lacking  Insight:  None  Engagement in Group:  Lacking  Additional Comments:  Patient attended but did not participated in group  Scot Dock 03/30/2012, 9:51 PM

## 2012-03-31 NOTE — Progress Notes (Signed)
D   No change from my previous assessment   Pt continues to appear somewhat catatonic  When he does speak he is barely audible   He does not initiate interaction with anyone  He appears severly depressed   He does come to the medication with prompting to get medications A   Verbal support given   Medications administered and effectiveness monitored  Q 15 min checks R   Pt safe at present

## 2012-03-31 NOTE — Progress Notes (Signed)
Psychoeducational Group Note  Date:  03/31/2012 Time:  0930am  Group Topic/Focus:  Making Healthy Choices:   The focus of this group is to help patients identify negative/unhealthy choices they were using prior to admission and identify positive/healthier coping strategies to replace them upon discharge.  Participation Level:  Did Not Attend  Participation Quality:    Affect:   Cognitive:  Insight:  Engagement in Group:  Additional Comments: inventory group   Valente David 03/31/2012,9:43 AM

## 2012-03-31 NOTE — Clinical Social Work Note (Signed)
[facility] Group Notes: (Clinical Social Work)   03/31/2012   11:15-11:45am   Type of Therapy:  Group Therapy   Participation Level:  Did Not Attend    Mellody Masri Grossman-Orr, LCSW 03/31/2012, 12:33 PM     

## 2012-03-31 NOTE — Progress Notes (Signed)
Psychoeducational Group Note  Date:  03/31/2012 Time:  2000  Group Topic/Focus:  Wrap-Up Group:   The focus of this group is to help patients review their daily goal of treatment and discuss progress on daily workbooks.  Participation Level:  Minimal  Participation Quality:  none  Affect:  Flat  Cognitive:  Alert  Insight:  None  Engagement in Group:  None  Additional Comments:  Patient attended but did not participated in group  Lita Mains [place] 03/31/2012, 9:24 PM

## 2012-03-31 NOTE — Progress Notes (Signed)
Patient ID: [name], male   DOB: January 16, 1961, 51 y.o.   MRN: [id number]  S:   Seen today. Able to talk today. Sleepy and confused today. Very poor historian. Kept his eyes closed most of the time while interviewing today in the hallway. Not sure why he is here now.  Slow Gait  Mental Status Examination/Evaluation:  Objective: Appearance: casually dressed  Eye Contact:: poor  Speech: slow  Volume: low  Mood: ok  Affect: ristricted  Thought Process: disorganized  Orientation: only person today  Thought Content: no avh  Suicidal Thoughts: No   Homicidal Thoughts: No   Memory: poor both long and short term  Judgement: poor  Insight: Poor  Psychomotor Activity: decreased  Concentration: Fair   Recall: poor  Akathisia: No   Handed: Right   AIMS (if indicated):   Assets: Communication Skills  Desire for Improvement  Social Support   Sleep: Number of Hours: 6.5    Laboratory/X-Ray  Psychological Evaluation(s)      Assessment:  AXIS I: Bipolar I Diorder  AXIS II: Deferred  AXIS III:  Past Medical History   Diagnosis  Date   .  Bipolar 1 disorder     AXIS IV: housing problems, occupational problems and problems with primary support group  AXIS V: 51-60 moderate symptoms  Treatment Plan/Recommendations:   Treatment Plan Summary:    Continue current meds

## 2012-04-01 DIAGNOSIS — F319 Bipolar disorder, unspecified: Secondary | ICD-10-CM | POA: Diagnosis present

## 2012-04-01 MED ORDER — QUETIAPINE FUMARATE ER 400 MG PO TB24
400.0000 mg | ORAL_TABLET | Freq: Every day | ORAL | Status: DC
  Administered 2012-04-01: 400 mg via ORAL
  Filled 2012-04-01 (×3): qty 1

## 2012-04-01 MED ORDER — LITHIUM CARBONATE 150 MG PO CAPS
450.0000 mg | ORAL_CAPSULE | Freq: Two times a day (BID) | ORAL | Status: DC
  Administered 2012-04-01 – 2012-04-19 (×36): 450 mg via ORAL
  Filled 2012-04-01 (×10): qty 3
  Filled 2012-04-01: qty 84
  Filled 2012-04-01 (×2): qty 3
  Filled 2012-04-01: qty 84
  Filled 2012-04-01 (×26): qty 3

## 2012-04-01 NOTE — Progress Notes (Signed)
Patient ID: [name], male   DOB: [birth year], 51 y.o.   MRN: [id number] Patient remains nonresponsive to questions and does not interact with staff or peers.  He does not speak unless spoken to and only nods his head.  He denies any SI/HI/AVH.  He is compliant with his medication.    Continue to monitor medication management and MD orders.  Collaborate with treatment team regarding patient's POC.  Safety checks continued every 15 minutes per protocol.  Patient's behavior is appropriate; encourage and support patient.

## 2012-04-01 NOTE — Treatment Plan (Signed)
Interdisciplinary Treatment Plan Update (Adult)  Date: 04/01/2012  Time Reviewed: 9:59 AM   Progress in Treatment: Attending groups: Yes Participating in groups: No  Selectively mute Taking medication as prescribed: Yes Tolerating medication: Yes   Family/Significant other contact made:  No Patient understands diagnosis:  Unknown  He is selectively mute Discussing patient identified problems/goals with staff:  No Medical problems stabilized or resolved:  Yes Denies suicidal/homicidal ideation: Yes  In tx team Issues/concerns per patient self-inventory:  Not filled out Other:  New problem(s) identified: N/A  Reason for Continuation of Hospitalization: Depression Medication stabilization  Interventions implemented related to continuation of hospitalization: Lithium, Latuda trial  Encourage group attendance and participation  15 min checks for safety  Additional comments:  Estimated length of stay: 3-5 days  Discharge Plan: unknown  New goal(s): N/A  Review of initial/current patient goals per problem list:   1.  Goal(s): Eliminate SI  Met:  Yes  Target date:12/16  As evidenced ZO:XWRU report  2.  Goal (s): Stabilize mood through the use of medications and the therapeutic milieu  Met:  No  Target date:12/20  As evidenced EA:[id number]'W affect will change to the extent that he is talking, affect is less constricted and he will take care of his personal hygiene.  He will rate his depression at a 4 or less on a 10 scale  3.  Goal(s):Identify dispositional and outpt tx plan  Met:  No  Target date:12/20  As evidenced JX:BJYN report  4.  Goal(s):  Met:  No  Target date:  As evidenced by:  Attendees: Patient:     Family:     Physician:  Thedore Mins 04/01/2012 9:59 AM   Nursing:  Liborio Nixon  04/01/2012 9:59 AM   Clinical Social Worker:  Richelle Ito 04/01/2012 9:59 AM   Extender:   04/01/2012 9:59 AM   Other:     Other:     Other:     Other:       Scribe for Treatment Team:   Ida Rogue, 04/01/2012 9:59 AM

## 2012-04-01 NOTE — ED Provider Notes (Signed)
Medical screening examination/treatment/procedure(s) were conducted as a shared visit with non-physician practitioner(s) and myself.  I personally evaluated the patient during the encounter  Doug Sou, MD 04/01/12 [phone]

## 2012-04-01 NOTE — Progress Notes (Signed)
D: Patient in dayroom sitting on approach.  Patient will not verbally answer questions from writer but he will nod his head yes or no.  Patient avoids eye contacts and appears sad and depressed.  Patient denies SI/HI and denies AVH.   A: Staff to monitor Q 15 mins for safety.  Encouragement and support offered.  No medications administered tonight. R: Patient remains safe on the unit.  Patient calm and attended group tonight.

## 2012-04-01 NOTE — Progress Notes (Signed)
[facility] LCSW Aftercare Discharge Planning Group Note  04/01/2012 9:49 AM  Participation Quality:  Inattentive  Affect:  Blunted  Cognitive:  unknown, did not talk  Insight:  None  Engagement in Group:  None  Modes of Intervention:  Clarification, Discussion and Socialization  Summary of Progress/Problems: [name] did not respond to my questions.  Other group members stated they have not heard him talk since admission. [name] 04/01/2012, 9:49 AM

## 2012-04-01 NOTE — Progress Notes (Signed)
Patient ID: [name], male   DOB: 07/15/1960, 51 y.o.   MRN: [id number] D. The patient continues to be selectively mute. He sat in the dayroom all evening watching a football game but attempted no interaction with the rest of the milieu. He got up and came to the medication window when called for medication. A. Encouraged to attend evening wrap up group. Administered HS medication. R. Attended evening group, however he stared at the floor and did not respond verbally when called upon. Compliant with medications.

## 2012-04-01 NOTE — Clinical Social Work Note (Signed)
  Type of Therapy: Process Group Therapy  Participation Level:  None  Participation Quality:  None  Affect:  Flat  Cognitive:  Unknown  Insight:  None  Engagement in Group:  None  Engagement in Therapy:  None  Modes of Intervention:  Activity, Clarification, Education, Problem-solving and Support  Summary of Progress/Problems: Today's group addressed the issue of overcoming obstacles.  Patients were asked to identify their biggest obstacle post d/c that stands in the way of their on-going success, and then problem solve as to how to manage this.  Burel came to group, but declined to participate verbally.       [name] 04/01/2012   5:07 PM

## 2012-04-01 NOTE — Progress Notes (Signed)
[facility] MD Progress Note  04/01/2012 10:54 AM [name]  MRN:  [id number]  Subjective: Patient is selectively mute. However, he appears depressed and flat. He is guarded, not participating in the unit activities and socially withdrawn.  Diagnosis:  Bipolar 1 disorder   ADL's:  Impaired  Sleep: Fair  Appetite:  Fair  Suicidal Ideation: denied on admission Plan:  none  Intent:  none Means:  none Homicidal Ideation: denied on admission Plan:  none Intent:  none Means:  none AEB (as evidenced by): Patient was reported to be manic, agitated with flight of ideas before admission.  Psychiatric Specialty Exam: Review of Systems  Constitutional: Positive for malaise/fatigue.  HENT: Negative.   Eyes: Negative.   Respiratory: Negative.   Cardiovascular: Negative.   Gastrointestinal: Negative.   Genitourinary: Negative.   Musculoskeletal: Negative.   Skin: Negative.   Neurological: Negative.   Endo/Heme/Allergies: Negative.   Psychiatric/Behavioral: Positive for depression.       Mood swings, flight of ideas    Blood pressure 150/106, pulse 93, temperature 98 F (36.7 C), temperature source Oral, resp. rate 20, height 5\' 11"  (1.803 m), weight 72.576 kg (160 lb).Body mass index is 22.32 kg/(m^2).  General Appearance: Bizarre and Disheveled  Eye Contact::  Poor  Speech:  Blocked  Volume:  undetermined  Mood:  Depressed  Affect:  Restricted  Thought Process:  unable to assess  Orientation:  Other:  unable to assess  Thought Content:  unable to assess  Suicidal Thoughts:  No  Homicidal Thoughts:  No  Memory:  unable to assess  Judgement:  Impaired  Insight:  Lacking  Psychomotor Activity:  Decreased  Concentration:  Poor  Recall:  Poor  Akathisia:  No  Handed:  Right  AIMS (if indicated):     Assets:  Social Support  Sleep:  Number of Hours: 6.75    Current Medications: Current Facility-Administered Medications  Medication Dose Route Frequency Provider Last  Rate Last Dose  . acetaminophen (TYLENOL) tablet 650 mg  650 mg Oral Q6H PRN Shuvon Rankin, NP      . alum & mag hydroxide-simeth (MAALOX/MYLANTA) 200-200-20 MG/5ML suspension 30 mL  30 mL Oral Q4H PRN Shuvon Rankin, NP      . lithium carbonate capsule 450 mg  450 mg Oral BID Elta Angell      . magnesium hydroxide (MILK OF MAGNESIA) suspension 30 mL  30 mL Oral Daily PRN Shuvon Rankin, NP      . QUEtiapine (SEROQUEL XR) 24 hr tablet 400 mg  400 mg Oral QPC supper Marua Qin      . traZODone (DESYREL) tablet 100 mg  100 mg Oral QHS PRN Shuvon Rankin, NP   100 mg at 03/30/12 2147    Lab Results: No results found for this or any previous visit (from the past 48 hour(s)).  Physical Findings: AIMS: Facial and Oral Movements Muscles of Facial Expression: None, normal Lips and Perioral Area: None, normal Jaw: None, normal Tongue: None, normal,Extremity Movements Upper (arms, wrists, hands, fingers): None, normal Lower (legs, knees, ankles, toes): None, normal, Trunk Movements Neck, shoulders, hips: None, normal, Overall Severity Severity of abnormal movements (highest score from questions above): None, normal Incapacitation due to abnormal movements: None, normal Patient's awareness of abnormal movements (rate only patient's report): No Awareness, Dental Status Current problems with teeth and/or dentures?: No Does patient usually wear dentures?: No  CIWA:  CIWA-Ar Total: 2  COWS:     Treatment Plan Summary: Daily contact with  patient to assess and evaluate symptoms and progress in treatment Medication management  Plan: 1. I will continue current medication regimen.  Medical Decision Making Problem Points:  Established problem, worsening (2), Review of last therapy session (1), Review of psycho-social stressors (1) and Self-limited or minor (1) Data Points:  Order Aims Assessment (2) Review of medication regiment & side effects (2) Review of new medications or change in dosage  (2)   I certify that inpatient services furnished can reasonably be expected to improve the patient's condition.   Thedore Mins, MD 04/01/2012, 10:54 AM

## 2012-04-01 NOTE — Progress Notes (Signed)
The focus of this group is to help patients review their daily goal of treatment and discuss progress on daily workbooks. Although Pt attended the evening session, he appeared very uncomfortable and made eye contact with no one, instead covering his face with his hands for the duration of the group. Pt was asked several questions by the Writer, which Pt waved off and did not answer.

## 2012-04-02 MED ORDER — LURASIDONE HCL 40 MG PO TABS
40.0000 mg | ORAL_TABLET | Freq: Every day | ORAL | Status: DC
  Administered 2012-04-02 – 2012-04-03 (×2): 40 mg via ORAL
  Filled 2012-04-02 (×3): qty 1

## 2012-04-02 NOTE — Progress Notes (Signed)
Psychoeducational Group Note  Date:  04/02/2012 Time:  2000  Group Topic/Focus:  Wrap-Up Group:   The focus of this group is to help patients review their daily goal of treatment and discuss progress on daily workbooks.  Participation Level:  None  Participation Quality:  Appropriate  Affect:  Appropriate  Cognitive:  Appropriate  Insight:  None  Engagement in Group:  None  Additional Comments:  Pt attended wrap-up group this evening but didn't participated in group.   Maridee Slape A 04/02/2012, 10:18 PM

## 2012-04-02 NOTE — Progress Notes (Signed)
Psychoeducational Group Note  Date:  04/02/2012 Time:  0930  Group Topic/Focus:  Recovery Goals:   The focus of this group is to identify appropriate goals for recovery and establish a plan to achieve them.  Participation Level: Did Not Attend  Participation Quality:  Not Applicable  Affect:  Not Applicable  Cognitive:  Not Applicable  Insight:  Not Applicable  Engagement in Group: Not Applicable  Additional Comments:  Pt refused to attend group this morning.  Lacye Mccarn E 04/02/2012, 11:24 AM

## 2012-04-02 NOTE — Progress Notes (Signed)
D: Patient in his room in bed on approach.  Patient will not verbally communicate with writer but he will nod his head yes or no to answer questions.  Patient denies SI/HI and denies AVH. Patient did become visible on the unit and attended group but did not share.  A: Staff to monitor Q 15 mins for safety.  Encouragement and support offered.  No medications to administer.  Patient encouraged to communicate with staff. R: Patient remains safe on the unit. Patient calm, but continues not to verbally communicate with staff.  No medications administered

## 2012-04-02 NOTE — Progress Notes (Signed)
Patient ID: [name], male   DOB: 1960/08/15, 51 y.o.   MRN: [id number] Patient presents with depressed mood and flat affects.  He remains noncommunicative with staff; minimal interaction.  He is compliant with medications.  He only nods when spoken to.  He denies any SI/HI/AVH.  He attends groups with no initiation or participation.  Continue to monitor medication management and MD orders.  Collaborate with treatment team regarding patient's POC.  Safety checks completed every 15 minutes per protocol.  Patient's behavior remains isolative and seclusive.  Encourage and support patient as needed.

## 2012-04-02 NOTE — Progress Notes (Signed)
[facility] LCSW Group Therapy  04/02/2012 1:55 PM  Type of Therapy:  Group Therapy  Participation Level:  None  Participation Quality:  Resistant  Affect:  Depressed  Cognitive:  unknown  Insight:  unknown  Engagement in Therapy:  None  Modes of Intervention:  Discussion, Exploration and Socialization  Summary of Progress/Problems: [name] stayed in group, looking at the floor with no verbal interaction.  Disheveled, selectively mute, demonstrates malaise.  [name] 04/02/2012, 1:55 PM

## 2012-04-02 NOTE — Progress Notes (Signed)
[facility] LCSW Aftercare Discharge Planning Group Note  04/02/2012 1:53 PM  Participation Quality:  Resistant  Affect:  Depressed  Cognitive:  unknown  Insight:  unknown  Engagement in Group:  None  Modes of Intervention:  Exploration  Summary of Progress/Problems: [name] came to AM group.  Disheveled.  No eye contact.  Did not respond to questions.  [name] 04/02/2012, 1:53 PM

## 2012-04-02 NOTE — Progress Notes (Signed)
Patient ID: [name], male   DOB: 12/23/1960, 51 y.o.   MRN: [id number] [facility] MD Progress Note  04/02/2012 11:08 AM RAIJON LINDFORS  MRN:  [id number]  Subjective: Patient now speaks intermittently, he reports that he is feeling very depressed, hopeless, lack motivations, has poor energy and  poor concentration. He denies suicidal and homicidal ideations, intent or plan. He is guarded, not participating in the unit activities and socially withdrawn.  Diagnosis:  Bipolar 1 disorder   ADL's:  Impaired  Sleep: Fair  Appetite:  Fair  Suicidal Ideation: denied on admission Plan:  none  Intent:  none Means:  none Homicidal Ideation: denied on admission Plan:  none Intent:  none Means:  none AEB (as evidenced by): Patient remains depressed and lacks motivations.  Psychiatric Specialty Exam: Review of Systems  Constitutional: Positive for malaise/fatigue.  HENT: Negative.   Eyes: Negative.   Respiratory: Negative.   Cardiovascular: Negative.   Gastrointestinal: Negative.   Genitourinary: Negative.   Musculoskeletal: Negative.   Skin: Negative.   Neurological: Negative.   Endo/Heme/Allergies: Negative.   Psychiatric/Behavioral: Positive for depression.       Mood swings, flight of ideas    Blood pressure 136/99, pulse 83, temperature 97.6 F (36.4 C), temperature source Oral, resp. rate 16, height 5\' 11"  (1.803 m), weight 72.576 kg (160 lb).Body mass index is 22.32 kg/(m^2).  General Appearance: Bizarre and Disheveled  Eye Contact::  Poor  Speech:  Blocked  Volume:  undetermined  Mood:  Depressed  Affect:  Restricted  Thought Process:  unable to assess  Orientation:  Other:  unable to assess  Thought Content:  unable to assess  Suicidal Thoughts:  No  Homicidal Thoughts:  No  Memory:  unable to assess  Judgement:  Impaired  Insight:  Lacking  Psychomotor Activity:  Decreased  Concentration:  Poor  Recall:  Poor  Akathisia:  No  Handed:  Right  AIMS (if  indicated):     Assets:  Social Support  Sleep:  Number of Hours: 6.75    Current Medications: Current Facility-Administered Medications  Medication Dose Route Frequency Provider Last Rate Last Dose  . acetaminophen (TYLENOL) tablet 650 mg  650 mg Oral Q6H PRN Shuvon Rankin, NP      . alum & mag hydroxide-simeth (MAALOX/MYLANTA) 200-200-20 MG/5ML suspension 30 mL  30 mL Oral Q4H PRN Shuvon Rankin, NP      . lithium carbonate capsule 450 mg  450 mg Oral BID Shakeila Pfarr   450 mg at 04/02/12 0919  . magnesium hydroxide (MILK OF MAGNESIA) suspension 30 mL  30 mL Oral Daily PRN Shuvon Rankin, NP      . QUEtiapine (SEROQUEL XR) 24 hr tablet 400 mg  400 mg Oral QPC supper Amay Mijangos   400 mg at 04/01/12 1723  . traZODone (DESYREL) tablet 100 mg  100 mg Oral QHS PRN Shuvon Rankin, NP   100 mg at 03/30/12 2147    Lab Results: No results found for this or any previous visit (from the past 48 hour(s)).  Physical Findings: AIMS: Facial and Oral Movements Muscles of Facial Expression: None, normal Lips and Perioral Area: None, normal Jaw: None, normal Tongue: None, normal,Extremity Movements Upper (arms, wrists, hands, fingers): None, normal Lower (legs, knees, ankles, toes): None, normal, Trunk Movements Neck, shoulders, hips: None, normal, Overall Severity Severity of abnormal movements (highest score from questions above): None, normal Incapacitation due to abnormal movements: None, normal Patient's awareness of abnormal movements (rate only patient's  report): No Awareness, Dental Status Current problems with teeth and/or dentures?: No Does patient usually wear dentures?: No  CIWA:  CIWA-Ar Total: 2  COWS:     Treatment Plan Summary: Daily contact with patient to assess and evaluate symptoms and progress in treatment Medication management  Plan: 1. I will discontinue Seroquel XR 2. Initiate Latuda 40mg  daily after super to address depressive phase of bipolar. 3. Will encourage  patient to attend group therapy and other milieu.  Medical Decision Making Problem Points:  Established problem, worsening (2), Review of last therapy session (1), Review of psycho-social stressors (1) and Self-limited or minor (1) Data Points:  Order Aims Assessment (2) Review of medication regiment & side effects (2) Review of new medications or change in dosage (2)   I certify that inpatient services furnished can reasonably be expected to improve the patient's condition.   Thedore Mins, MD 04/02/2012, 11:08 AM

## 2012-04-03 DIAGNOSIS — F319 Bipolar disorder, unspecified: Secondary | ICD-10-CM

## 2012-04-03 NOTE — [facility] Suicide Risk Assessment (Signed)
Suicide Risk Assessment  Admission Assessment     Nursing information obtained from:  Patient Demographic factors:  Male;Divorced or widowed;Caucasian Current Mental Status:  Limited speech, denies any SI, HI or AVH, disoragnized  Loss Factors:  Decrease in vocational status;Loss of significant relationship;Legal issues;Financial problems / change in socioeconomic status, not able to function Historical Factors:  Family history of suicide;Impulsivity;NA,  Risk Reduction Factors:  Employed;Living with another person, especially a relative  CLINICAL FACTORS:   Bipolar Disorder:   Depressive phase  COGNITIVE FEATURES THAT CONTRIBUTE TO RISK:  Loss of executive function    SUICIDE RISK:   Minimal: No identifiable suicidal ideation.  Patients presenting with no risk factors but with morbid ruminations; may be classified as minimal risk based on the severity of the depressive symptoms  PLAN OF CARE:   Continue current meds. Will expand hx  [name] 04/03/2012, 10:04 AM

## 2012-04-03 NOTE — Progress Notes (Signed)
The focus of this group is to help patients review their daily goal of treatment and discuss progress on daily workbooks. Pt attended the evening session, but refused to speak or make eye contact with the Writer.

## 2012-04-03 NOTE — Progress Notes (Signed)
[facility] Group Notes:  (Counselor/Nursing/MHT/Case Management/Adjunct)  04/03/2012 4:46 PM  Type of Therapy:  [employer]   Participation Level:  Did Not Attend  Participation Quality:  Appropriate  Affect:  Appropriate  Cognitive:  Appropriate  Insight:  Engaged  Engagement in Group:  Engaged  Engagement in Therapy:  Engaged  Modes of Intervention:  Activity and Education  Summary of Progress/Problems: Pt. Was listening attentively to the mental health association volunteer   Meredith Staggers 04/03/2012, 4:46 PM

## 2012-04-03 NOTE — H&P (Signed)
Psychiatric Admission Assessment Adult This is a late PAA entry.  Patient Identification:  [name] Date of Evaluation:  04/03/2012 Chief Complaint:  BIPOLAR D/O,DEPRESSED History of Present Illness: This is the second admission for [facility] who was brought to [facility] by his parents for increasing symptoms of depression with decompensation into mania where he spent `12,000$, bought a 1500$ dog with no place to put, bought a storage unit with nothing to put in it. He has made harassing phone calls to his previous employer and was arrested and spent 3 days in jail. He has a history of alcohol use, but was negative for both alcohol and drugs in the ED. He was placed on probation and released. The parents brought him to [facility] for further evaluation due to increasing depression, with vegetative symptoms, including paranoia.  He was sent to the ED for medical clearance and transferred to [facility]. Elements:  Location:  In patient admission psychiatric evaluation. Quality:  worsening. Severity:  severe. Timing:  over the last 4 weeks. Duration:  years. Context:  living situation, personal hygiene, finances, physical health. Associated Signs/Synptoms: Depression Symptoms:  depressed mood, psychomotor retardation, fatigue, hopelessness, disturbed sleep, (Hypo) Manic Symptoms:  None currently Anxiety Symptoms:  None documented Psychotic Symptoms:  Paranoia, PTSD Symptoms: Had a traumatic exposure:  wife committed suicide, lost his job, lost custody of his children  Psychiatric Specialty Exam: Physical Exam  Psychiatric: His affect is blunt. Slowed: catatonic. Thought content is paranoid. Cognition and memory are impaired. He expresses impulsivity and inappropriate judgment. He exhibits a depressed mood. He is noncommunicative.   Completed by MD in ED and reviewed, patient assessed and no further physical evaluation is needed at this time.  Review of Systems  Constitutional: Negative.   HENT: Negative.    Eyes: Negative.   Respiratory: Negative.   Cardiovascular: Negative.   Gastrointestinal: Negative.   Genitourinary: Negative.   Musculoskeletal: Negative.   Skin: Negative.   Neurological: Negative.   Endo/Heme/Allergies: Negative.     Blood pressure 134/92, pulse 82, temperature 97.7 F (36.5 C), temperature source Oral, resp. rate 16, height 5\' 11"  (1.803 m), weight 72.576 kg (160 lb).Body mass index is 22.32 kg/(m^2).  General Appearance: Disheveled  Eye Solicitor::  None  Speech:  Blocked  Volume:  none  Mood:  Depressed  Affect:  catatonic  Thought Process:  unable to determin  Orientation:  Patient does not respond  Thought Content:  Rumination  Suicidal Thoughts:  No  Homicidal Thoughts:  No  Memory:  Immediate;   Poor  Judgement:  Impaired  Insight:  Lacking  Psychomotor Activity:  Psychomotor Retardation  Concentration:  Poor  Recall:  Poor  Akathisia:  No  Handed:    AIMS (if indicated):     Assets:  Social Support  Sleep:  Number of Hours: 6.75     Past Psychiatric History: Diagnosis:  Bipolar disorder most recent episode mixed  Hospitalizations: 2  Outpatient Care:  Substance Abuse Care:  Self-Mutilation:  Suicidal Attempts:  Violent Behaviors:   Past Medical History:   Past Medical History  Diagnosis Date  . Bipolar 1 disorder    None. Allergies:   Allergies  Allergen Reactions  . Medrol (Methylprednisolone) Rash   PTA Medications: Prescriptions prior to admission  Medication Sig Dispense Refill  . lithium carbonate 300 MG capsule Take 300-600 mg by mouth 2 (two) times daily with a meal. Takes 1 capsule(300mg ) every morning and 2 capsules(600mg ) every evening      .  QUEtiapine (SEROQUEL XR) 400 MG 24 hr tablet Take 1 tablet (400 mg total) by mouth at bedtime.  30 tablet  0  . traZODone (DESYREL) 100 MG tablet Take 2 tablets (200 mg total) by mouth at bedtime.  60 tablet  0    Previous Psychotropic Medications:  Medication/Dose                  Substance Abuse History in the last 12 months:  yes  Consequences of Substance Abuse: Family Consequences:  financial stressors  Social History:  reports that he has never smoked. He has never used smokeless tobacco. He reports that he does not drink alcohol or use illicit drugs. Additional Social History: Pain Medications: not abusing Prescriptions: not abusing Over the Counter: not abusing History of alcohol / drug use?: Yes Name of Substance 1: alcohol 1 - Age of First Use: unknown 1 - Amount (size/oz): unknown  1 - Frequency: drinks when he is manic or at baseline, not drinking currently 1 - Duration: father uncertain of specifics of his use 1 - Last Use / Amount: 2-3 weeks ago                  Current Place of Residence:  Lives with his parents Place of Birth:   Family Members: Marital Status:  Widowed Children:  Sons:  Daughters: Relationships: Education:   Educational Problems/Performance: Religious Beliefs/Practices: History of Abuse (Emotional/Phsycial/Sexual) Teacher, music History:   Legal History: Hobbies/Interests:  Family History:  History reviewed. No pertinent family history.  No results found for this or any previous visit (from the past 72 hour(s)). Psychological Evaluations:  Assessment:   AXIS I:  Bipolar, Depressed AXIS II:  Deferred AXIS III:   Past Medical History  Diagnosis Date  . Bipolar 1 disorder    AXIS IV:  occupational problems, problems related to social environment and problems with primary support group AXIS V:  31-40 impairment in reality testing  Treatment Plan/Recommendations:  1. Admit for crisis management and stabilization. 2. Medication management to reduce current symptoms to base line and improve the patient's overall level of functioning 3. Treat health problems as indicated. 4. Develop treatment plan to decrease risk of relapse upon discharge and the need for readmission. 5.  Psycho-social education regarding relapse prevention and self care. 6. Health care follow up as needed for medical problems. 7. Restart home medications where appropriate.   Treatment Plan Summary: Daily contact with patient to assess and evaluate symptoms and progress in treatment Medication management Current Medications:  Current Facility-Administered Medications  Medication Dose Route Frequency Provider Last Rate Last Dose  . acetaminophen (TYLENOL) tablet 650 mg  650 mg Oral Q6H PRN Shuvon Rankin, NP      . alum & mag hydroxide-simeth (MAALOX/MYLANTA) 200-200-20 MG/5ML suspension 30 mL  30 mL Oral Q4H PRN Shuvon Rankin, NP      . lithium carbonate capsule 450 mg  450 mg Oral BID [name]   450 mg at 04/03/12 0756  . lurasidone (LATUDA) tablet 40 mg  40 mg Oral QPC supper [name]   40 mg at 04/02/12 1659  . magnesium hydroxide (MILK OF MAGNESIA) suspension 30 mL  30 mL Oral Daily PRN Shuvon Rankin, NP      . traZODone (DESYREL) tablet 100 mg  100 mg Oral QHS PRN Shuvon Rankin, NP   100 mg at 03/30/12 2147    Observation Level/Precautions:  routine  Laboratory:    Psychotherapy:    Medications:  Consultations:    Discharge Concerns:    Estimated LOS:  Other:     I certify that inpatient services furnished can reasonably be expected to improve the patient's condition.   Rona Ravens. Maryse Brierley PAC 12/18/20139:50 AM

## 2012-04-03 NOTE — H&P (Signed)
  Pt was seen by me on 12/14 and 12/15. Will continue current meds.  The detailed H&P note will be done by mid level (NP/PA).

## 2012-04-03 NOTE — Progress Notes (Signed)
Psychoeducational Group Note  Date:  04/03/2012 Time:  1100  Group Topic/Focus:  Healthy Communication:   The focus of this group is to discuss communication, barriers to communication, as well as healthy ways to communicate with others.  Participation Level:  None  Participation Quality:  ZERO  Affect:  Flat, Irritable and Labile  Cognitive:  Lacking  Insight:  None  Engagement in Group:  None  Additional Comments:  Pt sat in on MHT group but did not care to participate at all with any of the questions or discussions  Coty Larsh A 04/03/2012, 5:07 PM

## 2012-04-03 NOTE — Progress Notes (Signed)
[facility] LCSW Aftercare Discharge Planning Group Note  04/03/2012 3:24 PM  Participation Quality:  Resistant  Affect:  Depressed  Cognitive:  Unknown  Insight:  Resistant  Engagement in Group:  Poor  Modes of Intervention:  Discussion, Exploration and Socialization  Summary of Progress/Problems: [name] declined to speak this AM.  He had minimal eye contact.  i asked what was causing him to feel so hopeless.  He did not answer.  [name] 04/03/2012, 3:24 PM

## 2012-04-03 NOTE — Progress Notes (Signed)
D: Patient denies SI/HI. The patient is having selective mutism and refuses to answer whether he is having auditory and visual hallucinations. The patient refused to fill out sheet and will only answer RN by nodding or shaking his head. The patient is attending groups and interacting appropriately within the milieu. The patient was told to come to RN with any questions or concerns.  A: Patient given emotional support from RN. Patient encouraged to come to staff with concerns and/or questions. Patient's medication routine continued. Patient's orders and plan of care reviewed.  R: Patient remains cooperative. Will continue to monitor patient q15 minutes for safety.

## 2012-04-03 NOTE — Progress Notes (Signed)
[facility] MD Progress Note  04/03/2012 11:35 AM [name]  MRN:  [id number] Subjective:  Patient unable to speak. He has been nodding his head in response to the RN staff requests.  He has attended groups but has remained silent. He has acted appropriately on the hall. Diagnosis:  Bipolar disorder most recent episode mixed  ADL's:  Impaired  Sleep: Good  Appetite:  fair  Suicidal Ideation:  Patient can not answer. Reported as negative to the nursing staff. Homicidal Ideation:  Patient can not answer. Reported as negative to the nursing staff. AEB (as evidenced by):His lack of verbal response, affect and poor eye contact.   Psychiatric Specialty Exam: Review of Systems  Constitutional: Negative.  Negative for fever, chills, weight loss, malaise/fatigue and diaphoresis.  HENT: Negative for congestion and sore throat.   Eyes: Negative for blurred vision, double vision and photophobia.  Respiratory: Negative for cough, shortness of breath and wheezing.   Cardiovascular: Negative for chest pain, palpitations and PND.  Gastrointestinal: Negative for heartburn, nausea, vomiting, abdominal pain, diarrhea and constipation.  Musculoskeletal: Negative for myalgias, joint pain and falls.  Neurological: Negative for dizziness, tingling, tremors, sensory change, speech change, focal weakness, seizures, loss of consciousness, weakness and headaches.  Endo/Heme/Allergies: Negative for polydipsia. Does not bruise/bleed easily.  Psychiatric/Behavioral: Negative for depression, suicidal ideas, hallucinations, memory loss and substance abuse. The patient is not nervous/anxious and does not have insomnia.     Blood pressure 134/92, pulse 82, temperature 97.7 F (36.5 C), temperature source Oral, resp. rate 16, height 5\' 11"  (1.803 m), weight 72.576 kg (160 lb).Body mass index is 22.32 kg/(m^2).  General Appearance: Disheveled  Eye Solicitor::  None  Speech:  none  Volume:  none  Mood:  Depressed and  catatonic  Affect:  Flat  Thought Process:  NA  Orientation:  NA  Thought Content:  NA  Suicidal Thoughts:  No  Homicidal Thoughts:  No  Memory:  NA  Judgement:  Impaired  Insight:  Lacking  Psychomotor Activity:  Psychomotor Retardation  Concentration:  Poor  Recall:  Poor  Akathisia:  No  Handed:    AIMS (if indicated):     Assets:  Financial Resources/Insurance Housing  Sleep:  Number of Hours: 6.75    Current Medications: Current Facility-Administered Medications  Medication Dose Route Frequency Provider Last Rate Last Dose  . acetaminophen (TYLENOL) tablet 650 mg  650 mg Oral Q6H PRN Shuvon Rankin, NP      . alum & mag hydroxide-simeth (MAALOX/MYLANTA) 200-200-20 MG/5ML suspension 30 mL  30 mL Oral Q4H PRN Shuvon Rankin, NP      . lithium carbonate capsule 450 mg  450 mg Oral BID [name]   450 mg at 04/03/12 0756  . lurasidone (LATUDA) tablet 40 mg  40 mg Oral QPC supper [name]   40 mg at 04/02/12 1659  . magnesium hydroxide (MILK OF MAGNESIA) suspension 30 mL  30 mL Oral Daily PRN Shuvon Rankin, NP      . traZODone (DESYREL) tablet 100 mg  100 mg Oral QHS PRN Shuvon Rankin, NP   100 mg at 03/30/12 2147    Lab Results: No results found for this or any previous visit (from the past 48 hour(s)).  Physical Findings: AIMS: Facial and Oral Movements Muscles of Facial Expression: None, normal Lips and Perioral Area: None, normal Jaw: None, normal Tongue: None, normal,Extremity Movements Upper (arms, wrists, hands, fingers): None, normal Lower (legs, knees, ankles, toes): None, normal, Trunk Movements Neck,  shoulders, hips: None, normal, Overall Severity Severity of abnormal movements (highest score from questions above): None, normal Incapacitation due to abnormal movements: None, normal Patient's awareness of abnormal movements (rate only patient's report): No Awareness, Dental Status Current problems with teeth and/or dentures?: No Does patient usually  wear dentures?: No  CIWA:  CIWA-Ar Total: 2  COWS:     Treatment Plan Summary: Daily contact with patient to assess and evaluate symptoms and progress in treatment Medication management  Plan: 1. Discussed patient with MD and treatment team and will continue to monitor today since Latuda was started yesterday. 2. Continue to monitor.  Medical Decision Making Problem Points:  Established problem, worsening (2) Data Points:  Review or order medicine tests (1)  I certify that inpatient services furnished can reasonably be expected to improve the patient's condition.  Rona Ravens. Basha Krygier PAC 04/03/2012, 11:35 AM

## 2012-04-03 NOTE — Progress Notes (Signed)
D: Patient in bed on approach.  Patient continues to be nonverbal but will nod his head yes and no.  Patient denies SI/HI and denies AVH.  Patient makes groaning sound but denies pain or discomfort.  Patient instructed to come to staff with any questions or concerns. A: Staff to monitor Q 15 mins for safety.  Encouragement and support offered.  No mediations to administer. R: Patient remains safe on the unit.  Patient calm but continues to be nonverbal.  Patient did attend wrap up group tonight.

## 2012-04-04 MED ORDER — LURASIDONE HCL 80 MG PO TABS
80.0000 mg | ORAL_TABLET | Freq: Every day | ORAL | Status: DC
  Administered 2012-04-04 – 2012-04-17 (×14): 80 mg via ORAL
  Filled 2012-04-04 (×4): qty 1
  Filled 2012-04-04: qty 14
  Filled 2012-04-04 (×12): qty 1

## 2012-04-04 NOTE — Progress Notes (Signed)
Patient ID: [name], male   DOB: 01/30/1961, 51 y.o.   MRN: [id number] [facility] MD Progress Note  04/04/2012 10:34 AM [name]  MRN:  [id number] Subjective:  Patient reports being depressed, has difficulty sleeping, hopeless and feeling worthless. He denies suicidal, homicidal ideations, intent or plan. He has been attending groups but not contributing. He is compliant with his medications with no adverse reaction.  Diagnosis:  Bipolar disorder most recent episode mixed  ADL's:  Impaired  Sleep: Good  Appetite:  fair  Suicidal Ideation: denies  Homicidal Ideation: denies  AEB (as evidenced by):Patient reporting ongoing depressive symptoms but denies psychosis or delusional symptoms.   Psychiatric Specialty Exam: Review of Systems  Constitutional: Negative.  Negative for fever, chills, weight loss, malaise/fatigue and diaphoresis.  HENT: Negative for congestion and sore throat.   Eyes: Negative for blurred vision, double vision and photophobia.  Respiratory: Negative for cough, shortness of breath and wheezing.   Cardiovascular: Negative for chest pain, palpitations and PND.  Gastrointestinal: Negative for heartburn, nausea, vomiting, abdominal pain, diarrhea and constipation.  Musculoskeletal: Negative for myalgias, joint pain and falls.  Neurological: Negative for dizziness, tingling, tremors, sensory change, speech change, focal weakness, seizures, loss of consciousness, weakness and headaches.  Endo/Heme/Allergies: Negative for polydipsia. Does not bruise/bleed easily.  Psychiatric/Behavioral: Negative for depression, suicidal ideas, hallucinations, memory loss and substance abuse. The patient is not nervous/anxious and does not have insomnia.     Blood pressure 139/97, pulse 69, temperature 98 F (36.7 C), temperature source Oral, resp. rate 16, height 5\' 11"  (1.803 m), weight 72.576 kg (160 lb).Body mass index is 22.32 kg/(m^2).  General Appearance: Disheveled  Eye  Solicitor::  None  Speech:  none  Volume:  none  Mood:  Depressed and catatonic  Affect:  Flat  Thought Process:  NA  Orientation:  NA  Thought Content:  NA  Suicidal Thoughts:  No  Homicidal Thoughts:  No  Memory:  NA  Judgement:  Impaired  Insight:  Lacking  Psychomotor Activity:  Psychomotor Retardation  Concentration:  Poor  Recall:  Poor  Akathisia:  No  Handed:    AIMS (if indicated):     Assets:  Financial Resources/Insurance Housing  Sleep:  Number of Hours: 6.5    Current Medications: Current Facility-Administered Medications  Medication Dose Route Frequency Provider Last Rate Last Dose  . acetaminophen (TYLENOL) tablet 650 mg  650 mg Oral Q6H PRN Shuvon Rankin, NP      . alum & mag hydroxide-simeth (MAALOX/MYLANTA) 200-200-20 MG/5ML suspension 30 mL  30 mL Oral Q4H PRN Shuvon Rankin, NP      . lithium carbonate capsule 450 mg  450 mg Oral BID Aletta Edmunds   450 mg at 04/04/12 0735  . lurasidone (LATUDA) tablet 40 mg  40 mg Oral QPC supper Eliel Dudding   40 mg at 04/03/12 1703  . magnesium hydroxide (MILK OF MAGNESIA) suspension 30 mL  30 mL Oral Daily PRN Shuvon Rankin, NP      . traZODone (DESYREL) tablet 100 mg  100 mg Oral QHS PRN Shuvon Rankin, NP   100 mg at 03/30/12 2147    Lab Results: No results found for this or any previous visit (from the past 48 hour(s)).  Physical Findings: AIMS: Facial and Oral Movements Muscles of Facial Expression: None, normal Lips and Perioral Area: None, normal Jaw: None, normal Tongue: None, normal,Extremity Movements Upper (arms, wrists, hands, fingers): None, normal Lower (legs, knees, ankles, toes): None, normal, Trunk  Movements Neck, shoulders, hips: None, normal, Overall Severity Severity of abnormal movements (highest score from questions above): None, normal Incapacitation due to abnormal movements: None, normal Patient's awareness of abnormal movements (rate only patient's report): No Awareness, Dental  Status Current problems with teeth and/or dentures?: No Does patient usually wear dentures?: No  CIWA:  CIWA-Ar Total: 2  COWS:     Treatment Plan Summary: Daily contact with patient to assess and evaluate symptoms and progress in treatment Medication management  Plan: 1. I will increase Latuda to 80mg  daily with super. 2. Continue lithium 450mg  twice daily.  Medical Decision Making Problem Points:  Established problem, worsening (2) Data Points:  Review or order medicine tests (1)  I certify that inpatient services furnished can reasonably be expected to improve the patient's condition.  Thedore Mins, MD 04/04/2012, 10:34 AM

## 2012-04-04 NOTE — Progress Notes (Signed)
Psychoeducational Group Note  Date:  04/04/2012 Time:  2000  Group Topic/Focus:  Karaoke  Participation Level: Did Not Attend  Participation Quality:  Not Applicable  Affect:  Not Applicable  Cognitive:  Not Applicable  Insight:  Not Applicable  Engagement in Group: Not Applicable  Additional Comments:    Flonnie Hailstone 04/04/2012, 11:37 PM

## 2012-04-04 NOTE — Progress Notes (Signed)
D: Patient denies SI/HI and auditory and visual hallucinations. The patient has a depressed mood and affect. The patient displays selective mutism and will rarely give answers to questions asked of him. The patient reports not sleeping well and that his appetite and energy levels are low. The patient is attending groups and interacting appropriately within the milieu.  A: Patient given emotional support from RN. Patient encouraged to come to staff with concerns and/or questions. Patient's medication routine continued. Patient's orders and plan of care reviewed.  R: Patient remains cooperative. Will continue to monitor patient q15 minutes for safety.

## 2012-04-04 NOTE — Progress Notes (Signed)
[facility] Group Notes:  (Counselor/Nursing/MHT/Case Management/Adjunct)  04/04/2012 10:27 AM  Type of Therapy:  Discharge Planning  Participation Level:  Minimal, attendance only  Participation Quality:  Minimal, attendance only  Affect:  Flat  Cognitive:  unknown  Insight:  None  Engagement in Group:  None  Engagement in Therapy:  None  Modes of Intervention:  Clarification and Support  Summary of Progress/Problems: [name] cam in for last few minutes of group and choose not to share, nor make eye contact.  He was offered support.    [name] 04/04/2012, 10:27 AM

## 2012-04-05 NOTE — Clinical Social Work Note (Signed)
[facility] LCSW Group Therapy  04/05/2012 4:59 PM   Type of Therapy:  Group Therapy  Participation Level:  None  Participation Quality:  Attentive  Affect:  Appropriate  Cognitive:  Appropriate  Insight:  Limited  Engagement in Therapy:  Minimal  Modes of Intervention:  Clarification, Education, Exploration and Socialization  Summary of Progress/Problems: Today's group focused on relapse prevention.  We defined the term, and then brainstormed on ways to prevent relapse. [name] stayed for the entire group.  He was not distracting, but was also non-participatory.  [name] 04/05/2012 , 4:59 PM

## 2012-04-05 NOTE — Progress Notes (Signed)
[facility] Group Notes:  (Counselor/Nursing/MHT/Case Management/Adjunct)  04/05/2012 1:14 PM  Type of Therapy:  Discharge Planning  Participation Level:  None  Participation Quality:  present physically  Affect:  Flat  Cognitive:  Unknown  Insight:  None  Engagement in Group:  None  Engagement in Therapy:  None  Modes of Intervention:  Exploration  Summary of Progress/Problems:  [name] was present for majority of group and did indicate that he wished not to share, other than that no participation from [name].    [name] 04/05/2012, 1:14 PM

## 2012-04-05 NOTE — Progress Notes (Signed)
Psychoeducational Group Note  Date:  04/05/2012 Time:  2000  Group Topic/Focus:  Wrap-Up Group:   The focus of this group is to help patients review their daily goal of treatment and discuss progress on daily workbooks.  Participation Level:  None  Participation Quality:  Appropriate  Affect:  Appropriate  Cognitive:  Confused  Insight:  None  Engagement in Group:  None  Additional Comments:  Pt attended wrap-up group this evening. Pt didn't participated when asked to share how his day was. Pt seemed a little confused.   Paulla Mcclaskey A 04/05/2012, 11:24 PM

## 2012-04-05 NOTE — Progress Notes (Signed)
Patient ID: [name], male   DOB: 1960-07-06, 51 y.o.   MRN: [id number]  D: Pt denies SI/HI/AVH. Pt is pleasant and cooperative, but continues selective mutism. Pt will answer questions by nodding. Pt sleeping most of the shift, minimal interaction exhibited.   A: Pt was offered support and encouragement. Pt was given scheduled medications. Pt was encourage to attend groups. Q 15 minute checks were done for safety.     R: Pt is taking medication. Pt has no complaints at this time.Pt receptive to treatment and safety maintained on unit.

## 2012-04-05 NOTE — Progress Notes (Signed)
  Adult Psychosocial Assessment Update Interdisciplinary Team  Previous [facility] admissions/discharges:  Admissions Discharges  Date: Feb 10 2012 Date: October 29  Date: Date:  Date: Date:  Date: Date:  Date: Date:   Changes since the last Psychosocial Assessment (including adherence to outpatient mental health and/or substance abuse treatment, situational issues contributing to decompensation and/or relapse).  Patient's mother & father have shared information as patient remain's selectively mute.Father, who is also pt's POA. reports pt was since fired in August 2013 from job of 4 years at [employer]. Pt went to court 12/12 for on charges of threats to multiple employees at previous place of employment and although charges were dropped pt will be on parole for one year.  Father also states patient went through 12K in one week    purchasing a dog, clothing and other unexpected luxuries.    As an aside F states pt's first break was at 32, he has probably had 10 hospitalizations over the years and lost custody of children in 2005 after leaving them in a restaurant.  Wife (who was a psyche nurse with addiction issues) committed suicide in 2006 Patient lost home and has never had grief counseling.          Discharge Plan 1. Will you be returning to the same living situation after discharge?   Yes: X yes pt lives with Mom No:      If no, what is your plan?           2. Would you like a referral for services when you are discharged? Yes:  X   If yes, for what services?  No:        [facility] for medication management       Summary and Recommendations (to be completed by the evaluator)  Patient is a 51 YO unemployed  Widow admitted with diagnosis of Bipolar 1 disorder.   Patient would benefit from crisis stabilization, medication evaluation, therapy groups for processing thoughts/feelings/experiences, psycho ed groups for coping skills, and case management for discharge  planning                       Signature:  [name], 04/05/2012 12:49 PM

## 2012-04-05 NOTE — Progress Notes (Signed)
Patient ID: [name], male   DOB: Mar 21, 1961, 51 y.o.   MRN: [id number] D. The patient continues to be selectively mute. Poor eye contact and speaks with a soft almost inaudible tone when he does speak. No social interaction with staff or milieu. A. Encouraged to attend evening group. Attempts made to engage him in conversation. Q 15 minute checks monitored for safety. R. The patient attended but did not actively participate in evening wrap up group. He sat staring into space. Only shook his head yes when asked if he had been to all his groups today. Could not verbally elaborate any further, The patient remains safe. Will continue to monitor.

## 2012-04-05 NOTE — Progress Notes (Signed)
D: Patient denies SI/HI and auditory and visual hallucinations. The patient has a depressed mood and affect. The patient displays selective mutism and will spend most of the day in silence but the patient has begun to  The patient reports not sleeping well and that his appetite and energy levels are low. The patient is attending groups and interacting appropriately within the milieu.   A: Patient given emotional support from RN. Patient encouraged to come to staff with concerns and/or questions. Patient's medication routine continued. Patient's orders and plan of care reviewed.   R: Patient remains cooperative. Will continue to monitor patient q15 minutes for safety.

## 2012-04-05 NOTE — Progress Notes (Signed)
Interdisciplinary Treatment Plan Update (Adult)  Date: 04/05/2012  Time Reviewed: 9:43 AM   Progress in Treatment: Attending groups: Yes Participating in groups: Yes Taking medication as prescribed:  Yes Tolerating medication:  Yes Family/Significant othe contact made: not as yet Patient understands diagnosis: Yes Discussing patient identified problems/goals with staff: Yes Medical problems stabilized or resolved:  Yes Denies suicidal/homicidal ideation: Yes Issues/concerns per patient self-inventory:  Inventory not completed by patient Other: N/A  New problem(s) identified: None Identified  Reason for Continuation of Hospitalization: Medication stabilization Suicidal ideation Other; describe selective mutism  Interventions implemented related to continuation of hospitalization: mood stabilization, medication monitoring and adjustment, group therapy and psycho education, suicide risk assessment, collateral contact, aftercare planning, ongoing physician assessments and safety checks q 15 mins  Additional comments: N/A  Estimated length of stay: 5 or more days  Discharge Plan:  CSW is assessing for appropriate referrals.   New goal(s): none identified  Review of initial/current patient goals per problem list:    1. Goal(s): Address suicidal ideation  Met: No  Target date: 4 days  As evidenced by: patient report 2. Goal (s): Reduce depressive symptoms from a 10 to a 3  Met: No  Target date: 3-5 days  As evidenced by: Patient report    Attendees: Patient:     Family:     Physician:  Dr Jannifer Franklin 04/05/2012 9:43 AM   Nursing:   Vanetta Mulders 04/05/2012 9:43 AM   Clinical Social Worker Carney Bern, LCSWA 04/05/2012 9:43 AM   Other:  Nestor Ramp, RN 04/05/2012 9:43 AM   Other:  Shelda Jakes, PA 04/05/2012 9:43 AM   Other:   04/05/2012 9:43 AM   Other:   04/05/2012 9:43 AM    Scribe for Treatment Team:   Carney Bern, LCSWA  04/05/2012 9:43  AM

## 2012-04-05 NOTE — Progress Notes (Signed)
D:Pt has a flat affect with brief eye contact. When writer asked pt how he was feeling, he responded in a low voice "confused." Pt answers questions in short, one word answers. Pt is isolative with minimal interaction.  A:Encouraged pt to discuss feelings. Offered support and 15 minute checks. R:Pt denies si and hi. Pt denies hallucinations. Safety maintained on the unit.

## 2012-04-05 NOTE — Progress Notes (Signed)
Patient ID: [name], male   DOB: 09/07/1960, 51 y.o.   MRN: [id number] [facility] MD Progress Note  04/05/2012 9:10 AM [name]  MRN:  [id number]  Subjective:  Patient is a little motivated but remains depressed, has difficulty sleeping, feeling  hopeless and  worthless. He denies  Psychosis, delusions, suicidal, homicidal ideations, intent or plan. He has been attending groups but not contributing. He is compliant with his medications with no adverse reaction.  Diagnosis:  Bipolar disorder most recent episode mixed  ADL's:  Impaired  Sleep: Good  Appetite:  fair  Suicidal Ideation: denies  Homicidal Ideation: denies  AEB (as evidenced by):Patient reporting ongoing depressive symptoms but denies psychosis or delusional symptoms.   Psychiatric Specialty Exam: Review of Systems  Constitutional: Negative.  Negative for fever, chills, weight loss, malaise/fatigue and diaphoresis.  HENT: Negative for congestion and sore throat.   Eyes: Negative for blurred vision, double vision and photophobia.  Respiratory: Negative for cough, shortness of breath and wheezing.   Cardiovascular: Negative for chest pain, palpitations and PND.  Gastrointestinal: Negative for heartburn, nausea, vomiting, abdominal pain, diarrhea and constipation.  Musculoskeletal: Negative for myalgias, joint pain and falls.  Neurological: Negative for dizziness, tingling, tremors, sensory change, speech change, focal weakness, seizures, loss of consciousness, weakness and headaches.  Endo/Heme/Allergies: Negative for polydipsia. Does not bruise/bleed easily.  Psychiatric/Behavioral: Negative for depression, suicidal ideas, hallucinations, memory loss and substance abuse. The patient is not nervous/anxious and does not have insomnia.     Blood pressure 150/103, pulse 72, temperature 98.3 F (36.8 C), temperature source Oral, resp. rate 18, height 5\' 11"  (1.803 m), weight 72.576 kg (160 lb).Body mass index is 22.32  kg/(m^2).  General Appearance: Disheveled  Eye Solicitor::  None  Speech:  none  Volume:  none  Mood:  Depressed and catatonic  Affect:  Flat  Thought Process:  NA  Orientation:  NA  Thought Content:  NA  Suicidal Thoughts:  No  Homicidal Thoughts:  No  Memory:  NA  Judgement:  Impaired  Insight:  Lacking  Psychomotor Activity:  Psychomotor Retardation  Concentration:  Poor  Recall:  Poor  Akathisia:  No  Handed:    AIMS (if indicated):     Assets:  Financial Resources/Insurance Housing  Sleep:  Number of Hours: 6.5    Current Medications: Current Facility-Administered Medications  Medication Dose Route Frequency Provider Last Rate Last Dose  . acetaminophen (TYLENOL) tablet 650 mg  650 mg Oral Q6H PRN Shuvon Rankin, NP      . alum & mag hydroxide-simeth (MAALOX/MYLANTA) 200-200-20 MG/5ML suspension 30 mL  30 mL Oral Q4H PRN Shuvon Rankin, NP      . lithium carbonate capsule 450 mg  450 mg Oral BID Jordyn Hofacker   450 mg at 04/05/12 0729  . lurasidone (LATUDA) tablet 80 mg  80 mg Oral QPC supper Neil Errickson   80 mg at 04/04/12 1624  . magnesium hydroxide (MILK OF MAGNESIA) suspension 30 mL  30 mL Oral Daily PRN Shuvon Rankin, NP      . traZODone (DESYREL) tablet 100 mg  100 mg Oral QHS PRN Shuvon Rankin, NP   100 mg at 03/30/12 2147    Lab Results: No results found for this or any previous visit (from the past 48 hour(s)).  Physical Findings: AIMS: Facial and Oral Movements Muscles of Facial Expression: None, normal Lips and Perioral Area: None, normal Jaw: None, normal Tongue: None, normal,Extremity Movements Upper (arms, wrists, hands, fingers):  None, normal Lower (legs, knees, ankles, toes): None, normal, Trunk Movements Neck, shoulders, hips: None, normal, Overall Severity Severity of abnormal movements (highest score from questions above): None, normal Incapacitation due to abnormal movements: None, normal Patient's awareness of abnormal movements (rate only  patient's report): No Awareness, Dental Status Current problems with teeth and/or dentures?: No Does patient usually wear dentures?: No  CIWA:  CIWA-Ar Total: 2  COWS:     Treatment Plan Summary: Daily contact with patient to assess and evaluate symptoms and progress in treatment Medication management  Plan: 1. I will increase Latuda to 80mg  daily with super. 2. Continue lithium 450mg  twice daily.  Medical Decision Making Problem Points:  Established problem, worsening (2) Data Points:  Review or order medicine tests (1)  I certify that inpatient services furnished can reasonably be expected to improve the patient's condition.  Thedore Mins, MD 04/05/2012, 9:10 AM

## 2012-04-06 DIAGNOSIS — F3160 Bipolar disorder, current episode mixed, unspecified: Secondary | ICD-10-CM

## 2012-04-06 NOTE — Progress Notes (Signed)
D: Pt in bed resting with eyes closed. Respirations even and unlabored. Pt appears to be in no signs of distress at this time. A: Q15min checks remains for this pt. R: Pt remains safe at this time.   

## 2012-04-06 NOTE — Clinical Social Work Note (Signed)
[facility] Group Notes:  (Clinical Social Work)  04/06/2012  11:15-11:45AM  Summary of Progress/Problems:   The main focus of today's process group was for the patient to identify ways in which they have in the past sabotaged their own recovery and reasons they may have done this/what they received from doing it.  We then worked to identify a specific plan to avoid doing this when discharged from the hospital for this admission.  The patient expressed that he was having a hard time concentrating and did not want to participate in the discussion.  Type of Therapy:  Group Therapy - Process  Participation Level:  None  Participation Quality:  Drowsy  Affect:  Depressed and Flat  Cognitive:  Confused  Insight:  Poor  Engagement in Therapy:  Poor  Modes of Intervention:  Clarification, Education, Limit-setting, Problem-solving, Socialization, Support and Processing, Exploration, Discussion   Ambrose Mantle, LCSW 04/06/2012, 1:12 PM

## 2012-04-06 NOTE — Progress Notes (Signed)
Psychoeducational Group Note  Date:  04/06/2012 Time:  0945 am  Group Topic/Focus:  Identifying Needs:   The focus of this group is to help patients identify their personal needs that have been historically problematic and identify healthy behaviors to address their needs.  Participation Level:  Minimal  Participation Quality:  Resistant  Affect:  Blunted  Cognitive:  Alert  Insight:  Limited  Engagement in Group:  None  Additional Comments:   Andrena Mews 04/06/2012,11:02 AM

## 2012-04-06 NOTE — Progress Notes (Signed)
[facility] MD Progress Note  04/06/2012 2:10 PM [name]  MRN:  [id number]  Subjective:  Patient stated that he is relaxing by reading books and attending groups and has no complaints. He has a little motivation, depressed, has difficulty sleeping, feeling  hopeless and  worthless. He denies psychosis, delusions, suicidal, homicidal ideations, intent or plan. He has been attending groups. He is compliant with his medications with no adverse reaction.  Diagnosis:  Bipolar disorder most recent episode mixed  ADL's:  Impaired  Sleep: Good  Appetite:  fair  Suicidal Ideation: denies  Homicidal Ideation: denies  AEB (as evidenced by):Patient reporting ongoing depressive symptoms but denies psychosis or delusional symptoms.   Psychiatric Specialty Exam: Review of Systems  Constitutional: Negative.  Negative for fever, chills, weight loss, malaise/fatigue and diaphoresis.  HENT: Negative for congestion and sore throat.   Eyes: Negative for blurred vision, double vision and photophobia.  Respiratory: Negative for cough, shortness of breath and wheezing.   Cardiovascular: Negative for chest pain, palpitations and PND.  Gastrointestinal: Negative for heartburn, nausea, vomiting, abdominal pain, diarrhea and constipation.  Musculoskeletal: Negative for myalgias, joint pain and falls.  Neurological: Negative for dizziness, tingling, tremors, sensory change, speech change, focal weakness, seizures, loss of consciousness, weakness and headaches.  Endo/Heme/Allergies: Negative for polydipsia. Does not bruise/bleed easily.  Psychiatric/Behavioral: Negative for depression, suicidal ideas, hallucinations, memory loss and substance abuse. The patient is not nervous/anxious and does not have insomnia.     Blood pressure 148/79, pulse 60, temperature 98.6 F (37 C), temperature source Oral, resp. rate 15, height 5\' 11"  (1.803 m), weight 160 lb (72.576 kg).Body mass index is 22.32 kg/(m^2).  General  Appearance: Disheveled  Eye Solicitor::  None  Speech:  none  Volume:  none  Mood:  Depressed and catatonic  Affect:  Flat  Thought Process:  NA  Orientation:  NA  Thought Content:  NA  Suicidal Thoughts:  No  Homicidal Thoughts:  No  Memory:  NA  Judgement:  Impaired  Insight:  Lacking  Psychomotor Activity:  Psychomotor Retardation  Concentration:  Poor  Recall:  Poor  Akathisia:  No  Handed:    AIMS (if indicated):     Assets:  Financial Resources/Insurance Housing  Sleep:  Number of Hours: 6.75    Current Medications: Current Facility-Administered Medications  Medication Dose Route Frequency Provider Last Rate Last Dose  . acetaminophen (TYLENOL) tablet 650 mg  650 mg Oral Q6H PRN Shuvon Rankin, NP      . alum & mag hydroxide-simeth (MAALOX/MYLANTA) 200-200-20 MG/5ML suspension 30 mL  30 mL Oral Q4H PRN Shuvon Rankin, NP      . lithium carbonate capsule 450 mg  450 mg Oral BID [name]   450 mg at 04/06/12 0832  . lurasidone (LATUDA) tablet 80 mg  80 mg Oral QPC supper [name]   80 mg at 04/05/12 1815  . magnesium hydroxide (MILK OF MAGNESIA) suspension 30 mL  30 mL Oral Daily PRN Shuvon Rankin, NP      . traZODone (DESYREL) tablet 100 mg  100 mg Oral QHS PRN Shuvon Rankin, NP   100 mg at 03/30/12 2147    Lab Results: No results found for this or any previous visit (from the past 48 hour(s)).  Physical Findings: AIMS: Facial and Oral Movements Muscles of Facial Expression: None, normal Lips and Perioral Area: None, normal Jaw: None, normal Tongue: None, normal,Extremity Movements Upper (arms, wrists, hands, fingers): None, normal Lower (legs, knees, ankles,  toes): None, normal, Trunk Movements Neck, shoulders, hips: None, normal, Overall Severity Severity of abnormal movements (highest score from questions above): None, normal Incapacitation due to abnormal movements: None, normal Patient's awareness of abnormal movements (rate only patient's report):  No Awareness, Dental Status Current problems with teeth and/or dentures?: No Does patient usually wear dentures?: No  CIWA:  CIWA-Ar Total: 2  COWS:     Treatment Plan Summary: Daily contact with patient to assess and evaluate symptoms and progress in treatment Medication management  Plan: 1. Continue Latuda to 80mg  daily with super. 2. Continue lithium 450mg  twice daily.  Medical Decision Making Problem Points:  Established problem, worsening (2) Data Points:  Review or order medicine tests (1)  I certify that inpatient services furnished can reasonably be expected to improve the patient's condition.  Leata Mouse, MD 04/06/2012, 2:10 PM

## 2012-04-06 NOTE — Progress Notes (Signed)
D-Quiet and isolative this shift.  Rates depression and hopelessness at 9 Denies SI. Attended group but refused participation.  Extremely flat affect. Compliant with scheduled medications and no prn's requested. A- Support and encouragement given.  Continue with current POC and evaluation of treatment goals.  15' checks cont for safety. R- Remains safe.

## 2012-04-07 MED ORDER — ADULT MULTIVITAMIN W/MINERALS CH
1.0000 | ORAL_TABLET | Freq: Every day | ORAL | Status: DC
  Administered 2012-04-07 – 2012-04-19 (×13): 1 via ORAL
  Filled 2012-04-07: qty 1
  Filled 2012-04-07: qty 14
  Filled 2012-04-07 (×13): qty 1

## 2012-04-07 MED ORDER — BOOST / RESOURCE BREEZE PO LIQD
1.0000 | Freq: Two times a day (BID) | ORAL | Status: DC
  Administered 2012-04-07 – 2012-04-12 (×5): 1 via ORAL
  Filled 2012-04-07 (×28): qty 1

## 2012-04-07 NOTE — Progress Notes (Signed)
Psychoeducational Group Note  Date:  04/07/2012 Time:   Group Topic/Focus: did not attend   Participation Level:   Participation Quality:   Affect:     Cognitive:    Insight:    Engagement in Group:    Additional Comments:    Cresenciano Lick 04/07/2012, 10:52 AM

## 2012-04-07 NOTE — Progress Notes (Signed)
Patient ID: [name], male   DOB: 1960-06-05, 51 y.o.   MRN: [id number] D. The patient continues to have a depressed mood and flat affect. His appearance is disheveled. He mostly sits in the dayroom watching TV with minimal interaction in the milieu. Tonight in group he verbally responded to questions spontaneously and stated he was grateful for his mother and father and the support they provide. He reports going to all of his groups today and that he had a good day. A. Verbal emotional support and praise were given. Encouraged to shower. R. The patient did not shower.

## 2012-04-07 NOTE — Clinical Social Work Note (Signed)
[facility] Group Notes: (Clinical Social Work)   04/07/2012      Type of Therapy:  Group Therapy   Participation Level:  Did Not Attend    Ambrose Mantle, LCSW 04/07/2012, 12:36 PM

## 2012-04-07 NOTE — Progress Notes (Signed)
D- Patient is isolative and quiet but will respond to inquiry. Attended CM group with minimal participation.  Denies SI. Bath and room hygiene done. Compliant with medications. No pysical  complaints and no prn's requested. A- Informed [name] that c-pap mask was brought in by parents. Will inform night shift staff. Support,praise and encouragement given. Continue current POC and evaluation of treatment goals. Continue 15' checks for safety.  R- Remains safe.

## 2012-04-07 NOTE — Progress Notes (Addendum)
Nutrition Brief Note  Patient identified on the Malnutrition Screening Tool (MST) Report  Body mass index is 22.32 kg/(m^2). Pt weight is WNL  based on current BMI.   Current diet order is regular with fair intake meals at this time per patient.  Labs and medications reviewed.   Patient reports that he has a fair appetite and fair intake prior to admit but has lost 20# in the last 2 months due to inadequate intake.  Complains of constipation.  Does not like Ensure or Boost.  Wt Readings from Last 10 Encounters:  03/29/12 160 lb (72.576 kg)  02/11/12 169 lb (76.658 kg)   Per chart, patient has lost 9 # in the past 2 months.  Will Add MVI daily and Resource Breeze bid.  Oran Rein, RD, LDN Clinical Inpatient Dietitian Pager:  [phone] Weekend and after hours pager:  [phone]

## 2012-04-07 NOTE — Progress Notes (Signed)
Psychoeducational Group Note  Date:  04/07/2012 Time:  2000  Group Topic/Focus:  Wrap-Up Group:   The focus of this group is to help patients review their daily goal of treatment and discuss progress on daily workbooks.  Participation Level:  Minimal  Participation Quality:  Appropriate  Affect:  Flat  Cognitive:  Appropriate  Insight:  Limited  Engagement in Group:  Limited  Additional Comments:  Patient attended and participated in group tonight. He reports having a good day. He attended group. He advised that he is thankful for his mom and dad.  Lita Mains [facility] 04/07/2012, 4:21 AM

## 2012-04-07 NOTE — Progress Notes (Signed)
Patient ID: [name], male   DOB: December 21, 1960, 51 y.o.   MRN: [id number] D. The patient is becoming more spontaneous and interacting more in the milieu. His appearance has improved. He is showered and wearing clean clothes. Attended and participated in evening wrap up group. Was able to respond verbally with appropriate responses. Reports that he is not getting enough sleep.  A. Verbal support and praise given for improving his ADL's. Reviewed sleep medication that is available for him as needed. Instructed  to ask for it at bedtime if he needs it.  R. The patient came to the medication window on his own at bedtime and verbally requested medication for sleep.

## 2012-04-07 NOTE — Progress Notes (Signed)
[facility] MD Progress Note  04/07/2012 1:18 PM [name]  MRN:  [id number]  Subjective:  Patient has been sad, depressed and has flat affect. He stated that he is relaxing by reading books and attending groups and has no complaints. He denies psychosis, delusions, suicidal, homicidal ideations, intent or plan. He is compliant with his medications with no adverse reaction.  Diagnosis:  Bipolar disorder most recent episode mixed  ADL's:  Impaired  Sleep: Good  Appetite:  fair  Suicidal Ideation: denies  Homicidal Ideation: denies  AEB (as evidenced by):Patient reporting ongoing depressive symptoms but denies psychosis or delusional symptoms.   Psychiatric Specialty Exam: Review of Systems  Constitutional: Negative.  Negative for fever, chills, weight loss, malaise/fatigue and diaphoresis.  HENT: Negative for congestion and sore throat.   Eyes: Negative for blurred vision, double vision and photophobia.  Respiratory: Negative for cough, shortness of breath and wheezing.   Cardiovascular: Negative for chest pain, palpitations and PND.  Gastrointestinal: Negative for heartburn, nausea, vomiting, abdominal pain, diarrhea and constipation.  Musculoskeletal: Negative for myalgias, joint pain and falls.  Neurological: Negative for dizziness, tingling, tremors, sensory change, speech change, focal weakness, seizures, loss of consciousness, weakness and headaches.  Endo/Heme/Allergies: Negative for polydipsia. Does not bruise/bleed easily.  Psychiatric/Behavioral: Negative for depression, suicidal ideas, hallucinations, memory loss and substance abuse. The patient is not nervous/anxious and does not have insomnia.     Blood pressure 148/79, pulse 60, temperature 98.6 F (37 C), temperature source Oral, resp. rate 15, height 5\' 11"  (1.803 m), weight 160 lb (72.576 kg).Body mass index is 22.32 kg/(m^2).  General Appearance: Disheveled  Eye Solicitor::  None  Speech:  none  Volume:  none   Mood:  Depressed and catatonic  Affect:  Flat  Thought Process:  NA  Orientation:  NA  Thought Content:  NA  Suicidal Thoughts:  No  Homicidal Thoughts:  No  Memory:  NA  Judgement:  Impaired  Insight:  Lacking  Psychomotor Activity:  Psychomotor Retardation  Concentration:  Poor  Recall:  Poor  Akathisia:  No  Handed:    AIMS (if indicated):     Assets:  Financial Resources/Insurance Housing  Sleep:  Number of Hours: 5    Current Medications: Current Facility-Administered Medications  Medication Dose Route Frequency Provider Last Rate Last Dose  . acetaminophen (TYLENOL) tablet 650 mg  650 mg Oral Q6H PRN Shuvon Rankin, NP      . alum & mag hydroxide-simeth (MAALOX/MYLANTA) 200-200-20 MG/5ML suspension 30 mL  30 mL Oral Q4H PRN Shuvon Rankin, NP      . lithium carbonate capsule 450 mg  450 mg Oral BID [name]   450 mg at 04/07/12 0831  . lurasidone (LATUDA) tablet 80 mg  80 mg Oral QPC supper [name]   80 mg at 04/06/12 1713  . magnesium hydroxide (MILK OF MAGNESIA) suspension 30 mL  30 mL Oral Daily PRN Shuvon Rankin, NP      . traZODone (DESYREL) tablet 100 mg  100 mg Oral QHS PRN Shuvon Rankin, NP   100 mg at 03/30/12 2147    Lab Results: No results found for this or any previous visit (from the past 48 hour(s)).  Physical Findings: AIMS: Facial and Oral Movements Muscles of Facial Expression: None, normal Lips and Perioral Area: None, normal Jaw: None, normal Tongue: None, normal,Extremity Movements Upper (arms, wrists, hands, fingers): None, normal Lower (legs, knees, ankles, toes): None, normal, Trunk Movements Neck, shoulders, hips: None, normal, Overall  Severity Severity of abnormal movements (highest score from questions above): None, normal Incapacitation due to abnormal movements: None, normal Patient's awareness of abnormal movements (rate only patient's report): No Awareness, Dental Status Current problems with teeth and/or dentures?:  No Does patient usually wear dentures?: No  CIWA:  CIWA-Ar Total: 2  COWS:     Treatment Plan Summary: Daily contact with patient to assess and evaluate symptoms and progress in treatment Medication management  Plan: 1. Continue Latuda to 80mg  daily with super. 2. Continue lithium 450mg  twice daily.  Medical Decision Making Problem Points:  Established problem, worsening (2) Data Points:  Review or order medicine tests (1)  I certify that inpatient services furnished can reasonably be expected to improve the patient's condition.  Leata Mouse, MD 04/07/2012, 1:18 PM

## 2012-04-07 NOTE — Progress Notes (Signed)
Psychoeducational Group Note  Date:  04/07/2012 Time:  2000  Group Topic/Focus:  Wrap-Up Group:   The focus of this group is to help patients review their daily goal of treatment and discuss progress on daily workbooks.  Participation Level:  Minimal  Participation Quality:  minimal  Affect:  Excited and Flat  Cognitive:  Appropriate  Insight:  Limited  Engagement in Group:  Limited  Additional Comments:  Patient attended and participated in group tonight. He reports having a hard time sleeping, he attended groups. Not sure what he wants to accomplished before leaving here  Scot Dock 04/07/2012, 10:58 PM

## 2012-04-08 NOTE — Progress Notes (Signed)
D:Pt is flat/depressed with poor eye contact. Pt rates his depression as a 9 on 1-10 scale with 10 being the most depressed. He has minimal interaction and requires much prompting to have any interaction. Pt is cooperative and takes medication with out any problem.  A:Supported pt to discuss feelings. Encouraged pt to attend groups. Monitored q 15 minute checks.  R:Pt denies si and hi. He denies hallucinations. Safety maintained on the unit.

## 2012-04-08 NOTE — Progress Notes (Signed)
Patient ID: [name], male   DOB: Mar 01, 1961, 51 y.o.   MRN: [id number] [facility] MD Progress Note  04/08/2012 10:51 AM [name]  MRN:  [id number]  Subjective:  Patient reports decreased depressive symptoms as evidenced by feeling a little more motivated, less hopeless, sleeping better, improved concentration and feeling less fatigue. Patient denies psychosis, delusional or manic symptoms. He is compliant with his medications with no adverse reaction. Lithium level is 0.87.  Diagnosis:  Bipolar disorder most recent episode mixed  ADL's:  Impaired  Sleep: Good  Appetite:  fair  Suicidal Ideation: denies  Homicidal Ideation: denies  AEB (as evidenced by):Patient reporting ongoing depressive symptoms but denies psychosis or delusional symptoms.   Psychiatric Specialty Exam: Review of Systems  Constitutional: Negative.  Negative for fever, chills, weight loss, malaise/fatigue and diaphoresis.  HENT: Negative for congestion and sore throat.   Eyes: Negative for blurred vision, double vision and photophobia.  Respiratory: Negative for cough, shortness of breath and wheezing.   Cardiovascular: Negative for chest pain, palpitations and PND.  Gastrointestinal: Negative for heartburn, nausea, vomiting, abdominal pain, diarrhea and constipation.  Musculoskeletal: Negative for myalgias, joint pain and falls.  Neurological: Negative for dizziness, tingling, tremors, sensory change, speech change, focal weakness, seizures, loss of consciousness, weakness and headaches.  Endo/Heme/Allergies: Negative for polydipsia. Does not bruise/bleed easily.  Psychiatric/Behavioral: Positive for depression. Negative for suicidal ideas, hallucinations, memory loss and substance abuse. The patient is not nervous/anxious and does not have insomnia.     Blood pressure 124/83, pulse 81, temperature 97.5 F (36.4 C), temperature source Oral, resp. rate 20, height 5\' 11"  (1.803 m), weight 72.576 kg (160  lb).Body mass index is 22.32 kg/(m^2).  General Appearance: poorly groomed  Patent attorney::  None  Speech:  none  Volume:  none  Mood:  dysphoric  Affect:  Flat  Thought Process:  linear  Orientation:  To place, person and place  Thought Content:  Reality based  Suicidal Thoughts:  No  Homicidal Thoughts:  No  Memory:  good  Judgement:  Impaired  Insight:  Lacking  Psychomotor Activity:  Psychomotor Retardation  Concentration:  Poor  Recall:  fair  Akathisia:  No  Handed:    AIMS (if indicated):     Assets:  Financial Resources/Insurance Housing  Sleep:  Number of Hours: 6.75    Current Medications: Current Facility-Administered Medications  Medication Dose Route Frequency Provider Last Rate Last Dose  . acetaminophen (TYLENOL) tablet 650 mg  650 mg Oral Q6H PRN Shuvon Rankin, NP      . alum & mag hydroxide-simeth (MAALOX/MYLANTA) 200-200-20 MG/5ML suspension 30 mL  30 mL Oral Q4H PRN Shuvon Rankin, NP      . feeding supplement (RESOURCE BREEZE) liquid 1 Container  1 Container Oral BID BM Jeoffrey Massed, RD   1 Container at 04/08/12 [phone]  . lithium carbonate capsule 450 mg  450 mg Oral BID Emonnie Cannady   450 mg at 04/08/12 0830  . lurasidone (LATUDA) tablet 80 mg  80 mg Oral QPC supper Carlyn Lemke   80 mg at 04/07/12 1641  . magnesium hydroxide (MILK OF MAGNESIA) suspension 30 mL  30 mL Oral Daily PRN Shuvon Rankin, NP      . multivitamin with minerals tablet 1 tablet  1 tablet Oral Daily Jeoffrey Massed, RD   1 tablet at 04/08/12 0830  . traZODone (DESYREL) tablet 100 mg  100 mg Oral QHS PRN Shuvon Rankin, NP   100 mg  at 04/07/12 2135    Lab Results: No results found for this or any previous visit (from the past 48 hour(s)).  Physical Findings: AIMS: Facial and Oral Movements Muscles of Facial Expression: None, normal Lips and Perioral Area: None, normal Jaw: None, normal Tongue: None, normal,Extremity Movements Upper (arms, wrists, hands, fingers): None,  normal Lower (legs, knees, ankles, toes): None, normal, Trunk Movements Neck, shoulders, hips: None, normal, Overall Severity Severity of abnormal movements (highest score from questions above): None, normal Incapacitation due to abnormal movements: None, normal Patient's awareness of abnormal movements (rate only patient's report): No Awareness, Dental Status Current problems with teeth and/or dentures?: No Does patient usually wear dentures?: No  CIWA:  CIWA-Ar Total: 2  COWS:     Treatment Plan Summary: Daily contact with patient to assess and evaluate symptoms and progress in treatment Medication management  Plan: 1. Continue Latuda to 80mg  daily with super. 2. Continue lithium 450mg  twice daily.  Medical Decision Making Problem Points:  Established problem, worsening (2) Data Points:  Review or order medicine tests (1)  I certify that inpatient services furnished can reasonably be expected to improve the patient's condition.  Thedore Mins, MD 04/08/2012, 10:51 AM

## 2012-04-08 NOTE — Progress Notes (Signed)
D:  Pt avertive, depressed mood, pt forwards little, minimal interaction, disheveled appearance, denies SI/HI/AVH, no complaints at this time. A:  PRN sleep med given per orders, emotional support provided, encouraged pt to continue with treatment plan and attend all groups and activities. R:  Pt calm, cooperative, in bed at this time.

## 2012-04-08 NOTE — Clinical Social Work Note (Signed)
[facility] LCSW Group Therapy  04/08/2012 10:10 AM  Type of Therapy:  Discharge Planning  Participation Level:  Minimal  Participation Quality:  Appropriate  Affect:  Depressed  Cognitive:  Alert and Oriented  Insight:  Improving  Engagement in Therapy:  Improving  Modes of Intervention:  Clarification, Exploration and Support  Summary of Progress/Problems:  [name] presents with more engagement and displays eye contact.  Shared that he visited with family over weekend.  Affect much improved from flat and mute to engaged yet still depressed.   [name] 04/08/2012, 10:10 AM

## 2012-04-09 NOTE — Progress Notes (Signed)
D: Patient in his room in bed on approach.  Patient states he has felt depressed today.  Patient verbal with Clinical research associate and engaged in some conversation with Clinical research associate.  Patient states his depression is high but thinks he is getting a little better..  Patient denies SI/HI and denies AVH.  A: Staff to monitor Q 15 mins for safety.  Encouragement and support offered.  No scheduled medications to administer tonight. R: Patient remains safe on the unit.  Patient did not attend group tonight.  Patient had no medications administered tonight.

## 2012-04-09 NOTE — Progress Notes (Signed)
[facility] MD Progress Note  04/09/2012 12:31 PM [name]  MRN:  [id number] Subjective:  "alright" Diagnosis:  Bipolar 1 disorder   ADL's:  Intact  Sleep: Poor  Appetite:  Poor  Suicidal Ideation:  denies Homicidal Ideation:  denies AEB (as evidenced by): Patient's verbal response, affect, and written response on daily self inventory.  Psychiatric Specialty Exam: Review of Systems  Constitutional: Negative.  Negative for fever, chills, weight loss, malaise/fatigue and diaphoresis.  HENT: Negative for congestion and sore throat.   Eyes: Negative for blurred vision, double vision and photophobia.  Respiratory: Negative for cough, shortness of breath and wheezing.   Cardiovascular: Negative for chest pain, palpitations and PND.  Gastrointestinal: Negative for heartburn, nausea, vomiting, abdominal pain, diarrhea and constipation.  Musculoskeletal: Negative for myalgias, joint pain and falls.  Neurological: Negative for dizziness, tingling, tremors, sensory change, speech change, focal weakness, seizures, loss of consciousness, weakness and headaches.  Endo/Heme/Allergies: Negative for polydipsia. Does not bruise/bleed easily.  Psychiatric/Behavioral: Negative for depression, suicidal ideas, hallucinations, memory loss and substance abuse. The patient is not nervous/anxious and does not have insomnia.     Blood pressure 134/84, pulse 66, temperature 97.8 F (36.6 C), temperature source Oral, resp. rate 18, height 5\' 11"  (1.803 m), weight 72.576 kg (160 lb).Body mass index is 22.32 kg/(m^2).  General Appearance: Casual  Eye Contact::  improved  Speech:  Clear and Coherent and Slow  Volume:  Normal  Mood:  Depressed  Affect:  Congruent and Flat  Thought Process:  Goal Directed  Orientation:  Full (Time, Place, and Person)  Thought Content:  Negative  Suicidal Thoughts:  No  Homicidal Thoughts:  No  Memory:  Immediate;   Poor  Judgement:  Impaired  Insight:  Lacking   Psychomotor Activity:  Normal  Concentration:  Poor  Recall:  Poor  Akathisia:  No  Handed:    AIMS (if indicated):     Assets:  Housing  Sleep:  Number of Hours: 5.25    Current Medications: Current Facility-Administered Medications  Medication Dose Route Frequency Provider Last Rate Last Dose  . acetaminophen (TYLENOL) tablet 650 mg  650 mg Oral Q6H PRN Shuvon Rankin, NP      . alum & mag hydroxide-simeth (MAALOX/MYLANTA) 200-200-20 MG/5ML suspension 30 mL  30 mL Oral Q4H PRN Shuvon Rankin, NP      . feeding supplement (RESOURCE BREEZE) liquid 1 Container  1 Container Oral BID BM Jeoffrey Massed, RD   1 Container at 04/08/12 [phone]  . lithium carbonate capsule 450 mg  450 mg Oral BID [name]   450 mg at 04/09/12 1191  . lurasidone (LATUDA) tablet 80 mg  80 mg Oral QPC supper [name]   80 mg at 04/08/12 1810  . magnesium hydroxide (MILK OF MAGNESIA) suspension 30 mL  30 mL Oral Daily PRN Shuvon Rankin, NP      . multivitamin with minerals tablet 1 tablet  1 tablet Oral Daily Jeoffrey Massed, RD   1 tablet at 04/09/12 4782  . traZODone (DESYREL) tablet 100 mg  100 mg Oral QHS PRN Shuvon Rankin, NP   100 mg at 04/08/12 2137    Lab Results: No results found for this or any previous visit (from the past 48 hour(s)).  Physical Findings: AIMS: Facial and Oral Movements Muscles of Facial Expression: None, normal Lips and Perioral Area: None, normal Jaw: None, normal Tongue: None, normal,Extremity Movements Upper (arms, wrists, hands, fingers): None, normal Lower (legs, knees, ankles,  toes): None, normal, Trunk Movements Neck, shoulders, hips: None, normal, Overall Severity Severity of abnormal movements (highest score from questions above): None, normal Incapacitation due to abnormal movements: None, normal Patient's awareness of abnormal movements (rate only patient's report): No Awareness, Dental Status Current problems with teeth and/or dentures?: No Does patient  usually wear dentures?: No  CIWA:  CIWA-Ar Total: 2  COWS:     Treatment Plan Summary: Daily contact with patient to assess and evaluate symptoms and progress in treatment Medication management  Plan: 1. Continue current plan of care with no changes at this time.  Medical Decision Making Problem Points:  Established problem, stable/improving (1) Data Points:  Review or order medicine tests (1)  I certify that inpatient services furnished can reasonably be expected to improve the patient's condition.   Rona Ravens. Keatin Benham PAC 04/09/2012, 12:31 PM

## 2012-04-09 NOTE — Progress Notes (Signed)
Pt currently resting quietly in bed with eyes closed. Respirations are even and unlabored. No acute distress noted.  Safety has been maintained with Q15 minute observation. Will continue Q15 minute observation and continue current POC  

## 2012-04-09 NOTE — Progress Notes (Signed)
Psychoeducational Group Note  Date:  04/09/2012 Time:  8:15PM  Group Topic/Focus:  Wrap-Up Group:   The focus of this group is to help patients review their daily goal of treatment and discuss progress on daily workbooks.  Participation Level:  Did Not Attend   Gerrit Heck 04/09/2012, 8:47 PM

## 2012-04-09 NOTE — Progress Notes (Signed)
D: Patient denies SI/HI and A/V hallucinations; patient reports sleep to be poor and states that he has not been able to sleep even though he has been taking medications; reports appetite to be poor ; reports energy level is low ; r rates depression as 7/10; rates hopelessness 7/10; rates anxiety as 7/10; declined self inventory  A: Monitored q 15 minutes; patient encouraged to attend groups; patient educated about medications; patient given medications per physician orders; patient encouraged to express feelings and/or concerns  R: Patient is very guarded but logical and coherent and patient forwards little information; patient's interaction with staff and peers is appropriate but minimal;  patient is taking medications as prescribed and tolerating medications

## 2012-04-10 NOTE — Progress Notes (Addendum)
(  D) Patient in dayroom watching TV off and on throughout shift. Patient exhibits minimal eye contact when engaged. Flat affect. (A) Encouraged and supported. Attempts made to engage patient in conversation with little success. (R) Thought blocking evident. Joice Lofts RN MS EdS 04/10/2012  9:00 PM

## 2012-04-10 NOTE — Progress Notes (Signed)
Patient ID: [name], male   DOB: 1961-03-25, 51 y.o.   MRN: [id number] D: Pt is awake and active on the unit this AM. Pt denies SI/HI and A/V hallucinations. Pt is participating in the milieu and is cooperative with staff, but he has minimal interaction. Pt mood is depressed and his affect is blunted/ sad. Pt refused to complete his self inventory this AM.   A: Writer utilized therapeutic communication, encouraged pt to discuss feelings with staff and administered medication per MD orders. Writer also encouraged pt to attend groups. Pt avoids conversation and is guarded with staff. Pt shuts down when asked too many questions, but does not appear to be responding to internal stimuli.   R: Pt is attending groups and tolerating medications well. Writer will continue to monitor. 15 minute checks are ongoing for safety.

## 2012-04-10 NOTE — Progress Notes (Signed)
[facility] MD Progress Note  04/10/2012 9:48 AM [name]  MRN:  [id number]  Subjective: "I'm not doing good... I can't seem to concentrate or talk real good. I haven't slept in 3 weeks". Pt is not sure if he is having any side effects or problems on the mediocations.  Staff report that he is essentially non responsive.  He may or may not look at staff when staff try to initiate any interactions with him.  He stays in his room and drifts up to the day room for a few moments and then retreat to perhaps the safety of his room.  He reports that he goes down to the cafe for meals.  He reports his appetite as "not too good".  Diagnosis:  Bipolar disorder most recent episode mixed  ADL's:  Impaired  Sleep: Fair to poor by pt report, 6 plus hours per staff.  Appetite: Fair  Suicidal Ideation: denies  Homicidal Ideation: denies  AEB (as evidenced by):Patient and staff reports/observations  Psychiatric Specialty Exam: Review of Systems  Constitutional: Negative.  Negative for fever, chills, weight loss, malaise/fatigue and diaphoresis.  HENT: Negative for congestion and sore throat.   Eyes: Negative for blurred vision, double vision and photophobia.  Respiratory: Negative for cough, shortness of breath and wheezing.   Cardiovascular: Negative for chest pain, palpitations and PND.  Gastrointestinal: Negative for heartburn, nausea, vomiting, abdominal pain, diarrhea and constipation.  Musculoskeletal: Negative for myalgias, joint pain and falls.  Neurological: Negative for dizziness, tingling, tremors, sensory change, speech change, focal weakness, seizures, loss of consciousness, weakness and headaches.  Endo/Heme/Allergies: Negative for polydipsia. Does not bruise/bleed easily.  Psychiatric/Behavioral: Positive for depression. Negative for suicidal ideas, hallucinations, memory loss and substance abuse. The patient is not nervous/anxious and does not have insomnia.     Blood pressure 145/93,  pulse 62, temperature 98.2 F (36.8 C), temperature source Oral, resp. rate 20, height 5\' 11"  (1.803 m), weight 72.576 kg (160 lb).Body mass index is 22.32 kg/(m^2).  General Appearance: poorly groomed  Patent attorney::  None  Speech:  none  Volume:  none  Mood:  dysphoric  Affect:  Flat  Thought Process:  linear  Orientation:  To place, person and place  Thought Content:  Reality based  Suicidal Thoughts:  No  Homicidal Thoughts:  No  Memory:  good  Judgement:  Impaired  Insight:  Lacking  Psychomotor Activity:  Psychomotor Retardation  Concentration:  Poor  Recall:  fair  Akathisia:  No  Handed:  Right  AIMS (if indicated):     Assets:  Financial Resources/Insurance Housing  Sleep:  Number of Hours: 6.25    Current Medications: Current Facility-Administered Medications  Medication Dose Route Frequency Provider Last Rate Last Dose  . acetaminophen (TYLENOL) tablet 650 mg  650 mg Oral Q6H PRN Shuvon Rankin, NP      . alum & mag hydroxide-simeth (MAALOX/MYLANTA) 200-200-20 MG/5ML suspension 30 mL  30 mL Oral Q4H PRN Shuvon Rankin, NP      . feeding supplement (RESOURCE BREEZE) liquid 1 Container  1 Container Oral BID BM Jeoffrey Massed, RD   1 Container at 04/08/12 [phone]  . lithium carbonate capsule 450 mg  450 mg Oral BID [name]   450 mg at 04/10/12 2595  . lurasidone (LATUDA) tablet 80 mg  80 mg Oral QPC supper [name]   80 mg at 04/09/12 1820  . magnesium hydroxide (MILK OF MAGNESIA) suspension 30 mL  30 mL Oral Daily PRN Shuvon  Rankin, NP      . multivitamin with minerals tablet 1 tablet  1 tablet Oral Daily Jeoffrey Massed, RD   1 tablet at 04/10/12 [phone]  . traZODone (DESYREL) tablet 100 mg  100 mg Oral QHS PRN Shuvon Rankin, NP   100 mg at 04/08/12 2137    Lab Results: No results found for this or any previous visit (from the past 48 hour(s)).  Physical Findings: AIMS: Facial and Oral Movements Muscles of Facial Expression: None, normal Lips and Perioral  Area: None, normal Jaw: None, normal Tongue: None, normal,Extremity Movements Upper (arms, wrists, hands, fingers): None, normal Lower (legs, knees, ankles, toes): None, normal, Trunk Movements Neck, shoulders, hips: None, normal, Overall Severity Severity of abnormal movements (highest score from questions above): None, normal Incapacitation due to abnormal movements: None, normal Patient's awareness of abnormal movements (rate only patient's report): No Awareness, Dental Status Current problems with teeth and/or dentures?: No Does patient usually wear dentures?: No  CIWA:  CIWA-Ar Total: 2  COWS:     Treatment Plan Summary: Daily contact with patient to assess and evaluate symptoms and progress in treatment Medication management  Plan: 1. Continue Latuda to 80mg  daily with supper. 2. Continue lithium 450mg  twice daily.  Medical Decision Making Problem Points:  Established problem, worsening (2) Data Points:  Review or order medicine tests (1)  I certify that inpatient services furnished can reasonably be expected to improve the patient's condition.  Dan Humphreys, Yadriel Kerrigan MD 04/10/2012, 9:48 AM

## 2012-04-11 MED ORDER — BUPROPION HCL ER (XL) 150 MG PO TB24
150.0000 mg | ORAL_TABLET | Freq: Every day | ORAL | Status: DC
  Administered 2012-04-11 – 2012-04-15 (×5): 150 mg via ORAL
  Filled 2012-04-11 (×8): qty 1

## 2012-04-11 NOTE — Progress Notes (Signed)
Patient ID: [name], male   DOB: Aug 19, 1960, 51 y.o.   MRN: [id number] D: pt. In bed eyes closed, resp. Even. A:Saff will monitor q34min for safety. Writer will assess for s/s of distress. R: Pt. Is safe on the unit. No distress noted, resp. Unlabored.

## 2012-04-11 NOTE — Progress Notes (Signed)
Patient ID: [name], male   DOB: 1960/05/24, 51 y.o.   MRN: [id number] CSW spoke with patient's mother and left message for patient's father requesting them to come in for treatment team meeting with psychiatrist and patient.  Mother was agreeable; will anticipate that meeting will go as planned unless father calls in.   Carney Bern, LCSWA Clinical Social Worker [phone]

## 2012-04-11 NOTE — Progress Notes (Signed)
Patient ID: [name], male   DOB: 1960-12-19, 51 y.o.   MRN: [id number] [facility] MD Progress Note  04/11/2012 10:27 AM [name]  MRN:  [id number]  Subjective: "I'm still feeling depressed". Patient report ongoing depressive symptoms, stating that he is not sleeping well, has no motivations and poor energy level. He remains socially with drawn with minimal interaction with staffs and peers. Patient is compliant with his medications and did not report any adverse reaction. Current Lithium level is 0.87.  Diagnosis:  Bipolar disorder most recent episode mixed  ADL's:  Fair  Sleep: Fair to poor by patient's report, 6 plus hours per staff.  Appetite: Fair  Suicidal Ideation: denies  Homicidal Ideation: denies  AEB (as evidenced by):Patient and staff reports/observations  Psychiatric Specialty Exam: Review of Systems  Constitutional: Negative.  Negative for fever, chills, weight loss, malaise/fatigue and diaphoresis.  HENT: Negative for congestion and sore throat.   Eyes: Negative for blurred vision, double vision and photophobia.  Respiratory: Negative for cough, shortness of breath and wheezing.   Cardiovascular: Negative for chest pain, palpitations and PND.  Gastrointestinal: Negative for heartburn, nausea, vomiting, abdominal pain, diarrhea and constipation.  Musculoskeletal: Negative for myalgias, joint pain and falls.  Neurological: Negative for dizziness, tingling, tremors, sensory change, speech change, focal weakness, seizures, loss of consciousness, weakness and headaches.  Endo/Heme/Allergies: Negative for polydipsia. Does not bruise/bleed easily.  Psychiatric/Behavioral: Positive for depression. Negative for suicidal ideas, hallucinations, memory loss and substance abuse. The patient is not nervous/anxious and does not have insomnia.     Blood pressure 131/91, pulse 67, temperature 97 F (36.1 C), temperature source Oral, resp. rate 20, height 5\' 11"  (1.803 m),  weight 72.576 kg (160 lb).Body mass index is 22.32 kg/(m^2).  General Appearance: poorly groomed  Patent attorney::  None  Speech:  Clear, soft, monotones  Volume:  decreased  Mood:  dysphoric  Affect:  Flat  Thought Process:  linear  Orientation:  To place, person and place  Thought Content:  Reality based  Suicidal Thoughts:  No  Homicidal Thoughts:  No  Memory:  good  Judgement:  Impaired  Insight:  Lacking  Psychomotor Activity:  Psychomotor Retardation  Concentration:  Poor  Recall:  fair  Akathisia:  No  Handed:  Right  AIMS (if indicated):     Assets:  Financial Resources/Insurance Housing  Sleep:  Number of Hours: 6.5    Current Medications: Current Facility-Administered Medications  Medication Dose Route Frequency Provider Last Rate Last Dose  . acetaminophen (TYLENOL) tablet 650 mg  650 mg Oral Q6H PRN Shuvon Rankin, NP      . alum & mag hydroxide-simeth (MAALOX/MYLANTA) 200-200-20 MG/5ML suspension 30 mL  30 mL Oral Q4H PRN Shuvon Rankin, NP      . buPROPion (WELLBUTRIN XL) 24 hr tablet 150 mg  150 mg Oral Daily Mordechai Matuszak      . feeding supplement (RESOURCE BREEZE) liquid 1 Container  1 Container Oral BID BM Jeoffrey Massed, RD   1 Container at 04/10/12 1400  . lithium carbonate capsule 450 mg  450 mg Oral BID Orville Widmann   450 mg at 04/11/12 0755  . lurasidone (LATUDA) tablet 80 mg  80 mg Oral QPC supper Michall Noffke   80 mg at 04/10/12 1720  . magnesium hydroxide (MILK OF MAGNESIA) suspension 30 mL  30 mL Oral Daily PRN Shuvon Rankin, NP      . multivitamin with minerals tablet 1 tablet  1 tablet Oral  Daily Jeoffrey Massed, RD   1 tablet at 04/11/12 0755  . traZODone (DESYREL) tablet 100 mg  100 mg Oral QHS PRN Shuvon Rankin, NP   100 mg at 04/08/12 2137    Lab Results: No results found for this or any previous visit (from the past 48 hour(s)).  Physical Findings: AIMS: Facial and Oral Movements Muscles of Facial Expression:  (pt. in bed, eyes closed, no  abnormal movements noted.) Lips and Perioral Area: None, normal Jaw: None, normal Tongue: None, normal,Extremity Movements Upper (arms, wrists, hands, fingers): None, normal Lower (legs, knees, ankles, toes): None, normal, Trunk Movements Neck, shoulders, hips: None, normal, Overall Severity Severity of abnormal movements (highest score from questions above): None, normal Incapacitation due to abnormal movements: None, normal Patient's awareness of abnormal movements (rate only patient's report): No Awareness, Dental Status Current problems with teeth and/or dentures?: No Does patient usually wear dentures?: No  CIWA:  CIWA-Ar Total: 2  COWS:     Treatment Plan Summary: Daily contact with patient to assess and evaluate symptoms and progress in treatment Medication management  Plan: 1. Continue Latuda to 80mg  daily with supper. 2. Continue lithium 450mg  twice daily. 3. Add Wellbutrin XL 150mg  daily for depression.   Medical Decision Making Problem Points:  Established problem, worsening (2) Data Points:  Review or order medicine tests (1)  I certify that inpatient services furnished can reasonably be expected to improve the patient's condition.  Thedore Mins MD 04/11/2012, 10:27 AM

## 2012-04-11 NOTE — Progress Notes (Signed)
[facility] Group Notes:  (Counselor/Nursing/MHT/Case Management/Adjunct)  04/11/2012 5:42 PM  Type of Therapy:  Discharge Planning  Participation Level:  Minimal  Participation Quality:  Inattentive  Affect:  Flat  Cognitive:  Lacking  Insight:  Lacking  Engagement in Group:  Limited  Engagement in Therapy:  None  Modes of Intervention:  Exploration and Support  Summary of Progress/Problems: Patient shared nothing but did confirm he is seen at [facility] for follow up.  [name] 04/11/2012, 5:42 PM   [facility] Group Notes:  (Counselor/Nursing/MHT/Case Management/Adjunct)  04/11/2012 5:42 PM  Type of Therapy:  Group Therapy  Participation Level:  Minimal  Participation Quality:  Drowsy  Affect:  Flat  Cognitive:  Lacking  Insight:  None  Engagement in Group:  Limited  Engagement in Therapy:  None  Modes of Intervention:  Exploration and Support  Summary of Progress/Problems:  Patient shared only that he felt bad and stated "it is because of the medicine."  Patient's concern shared with nursing staff after group.    [name] 04/11/2012, 5:42 PM

## 2012-04-11 NOTE — Progress Notes (Signed)
D:  Patient up and in the milieu today, but not interacting much.  Very flat affect.  Started on Wellbutrin today and patient states he hopes it will help to lift his depression.  States he has been on it in the past and found it to be effective.  He denies suicidal ideation.  Patients father was in at lunch time and is requesting a family meeting with the treatment team.  Santina Evans from social work was notified.  A:  Medications as ordered.  Educated about new medication.  Therapeutic communication.  Reassured father and promised to pass on his concerns.  R:  Patient receptive to trying new medication.  Remains quite depressed.  Safety maintained on the unit.

## 2012-04-12 DIAGNOSIS — F314 Bipolar disorder, current episode depressed, severe, without psychotic features: Secondary | ICD-10-CM

## 2012-04-12 NOTE — Clinical Social Work Note (Signed)
[facility] LCSW Group Therapy   Type of Therapy:  Group Therapy  Participation Level:  Minimal  Participation Quality:  Attentive  Affect:  Depressed and Flat  Cognitive:  Oriented  Insight:  None shared  Engagement in Therapy:  Lacking  Modes of Intervention:  Exploration, Rapport Building and Support  Summary of Progress/Problems:  Pt was present and observant during today's group therapy session.  Kevontae participated by choosing two colors from a group of paint chips yet shared about neither other than to point to light blue and affirm that he liked it and point to dark color as the one he disliked.   [name] 04/12/2012 4:45 PM

## 2012-04-12 NOTE — Clinical Social Work Note (Signed)
Judge's parents were both called and informed that meeting at 10:30 today would not be possible due to staffing limitations, thus meeting postponed until Monday at 10:30. Carney Bern, LCSWA Clinical Social Worker [phone]

## 2012-04-12 NOTE — Progress Notes (Signed)
Patient ID: WILSON DUSENBERY, male   DOB: 1960-08-24, 51 y.o.   MRN: [id number] 04-12-12 @ 1251 nursing shift note: D: pt still appears depressed. He has a flat affect with a labile mood, he is isolative and having trouble concentrating. A: RN spoke with family members, who are concerned about him. RN attempted to be supportive with family members. He is taking his medications and was started on Wellbutrin yesterday. R: he denies any SI/hi and pain. RN will monitor and Q 15 min ck's continue.

## 2012-04-12 NOTE — Progress Notes (Signed)
Patient ID: [name], male   DOB: 1961/01/17, 51 y.o.   MRN: [id number] Patient ID: [name], male   DOB: 1960/11/11, 51 y.o.   MRN: [id number] [facility] MD Progress Note  04/12/2012 2:36 PM LAURIN MORGENSTERN  MRN:  [id number]  Subjective: "I'm not doing well. And my mood is not good. No I'm not suicidal"  Diagnosis:  Bipolar disorder most recent episode mixed  ADL's:  Fair  Sleep: Fair to poor by patient's report, 6 plus hours per staff.  Appetite: Fair  Suicidal Ideation: denies  Homicidal Ideation: denies  AEB (as evidenced by):Patient and staff reports/observations  Psychiatric Specialty Exam: Review of Systems  Constitutional: Negative.  Negative for fever, chills, weight loss, malaise/fatigue and diaphoresis.  HENT: Negative for congestion and sore throat.   Eyes: Negative for blurred vision, double vision and photophobia.  Respiratory: Negative for cough, shortness of breath and wheezing.   Cardiovascular: Negative for chest pain, palpitations and PND.  Gastrointestinal: Negative for heartburn, nausea, vomiting, abdominal pain, diarrhea and constipation.  Musculoskeletal: Negative for myalgias, joint pain and falls.  Neurological: Negative for dizziness, tingling, tremors, sensory change, speech change, focal weakness, seizures, loss of consciousness, weakness and headaches.  Endo/Heme/Allergies: Negative for polydipsia. Does not bruise/bleed easily.  Psychiatric/Behavioral: Positive for depression (Currently being stabilized with medications.). Negative for suicidal ideas, hallucinations, memory loss and substance abuse. The patient is not nervous/anxious and does not have insomnia.     Blood pressure 133/89, pulse 69, temperature 98.8 F (37.1 C), temperature source Oral, resp. rate 16, height 5\' 11"  (1.803 m), weight 72.576 kg (160 lb).Body mass index is 22.32 kg/(m^2).  General Appearance: poorly groomed  Patent attorney::  None  Speech:  Clear, soft, monotones    Volume:  decreased  Mood:  dysphoric  Affect:  Flat  Thought Process:  linear  Orientation:  To place, person and place  Thought Content:  Denies hallucinations, delusions and or paranoia.  Suicidal Thoughts:  No  Homicidal Thoughts:  No  Memory:  good  Judgement:  Impaired  Insight:  Lacking  Psychomotor Activity:  Psychomotor Retardation  Concentration:  Poor  Recall:  fair  Akathisia:  No  Handed:  Right  AIMS (if indicated):     Assets:  Financial Resources/Insurance Housing  Sleep:  Number of Hours: 6.25    Current Medications: Current Facility-Administered Medications  Medication Dose Route Frequency Provider Last Rate Last Dose  . acetaminophen (TYLENOL) tablet 650 mg  650 mg Oral Q6H PRN Shuvon Rankin, NP      . alum & mag hydroxide-simeth (MAALOX/MYLANTA) 200-200-20 MG/5ML suspension 30 mL  30 mL Oral Q4H PRN Shuvon Rankin, NP      . buPROPion (WELLBUTRIN XL) 24 hr tablet 150 mg  150 mg Oral Daily [name]   150 mg at 04/12/12 0812  . feeding supplement (RESOURCE BREEZE) liquid 1 Container  1 Container Oral BID BM Jeoffrey Massed, RD   1 Container at 04/12/12 1142  . lithium carbonate capsule 450 mg  450 mg Oral BID [name]   450 mg at 04/12/12 0865  . lurasidone (LATUDA) tablet 80 mg  80 mg Oral QPC supper [name]   80 mg at 04/11/12 1830  . magnesium hydroxide (MILK OF MAGNESIA) suspension 30 mL  30 mL Oral Daily PRN Shuvon Rankin, NP      . multivitamin with minerals tablet 1 tablet  1 tablet Oral Daily Jeoffrey Massed, RD   1 tablet at  04/12/12 1610  . traZODone (DESYREL) tablet 100 mg  100 mg Oral QHS PRN Shuvon Rankin, NP   100 mg at 04/08/12 2137    Lab Results: No results found for this or any previous visit (from the past 48 hour(s)).  Physical Findings: AIMS: Facial and Oral Movements Muscles of Facial Expression:  (pt. in bed, eyes closed, no abnormal movements noted.) Lips and Perioral Area: None, normal Jaw: None, normal Tongue:  None, normal,Extremity Movements Upper (arms, wrists, hands, fingers): None, normal Lower (legs, knees, ankles, toes): None, normal, Trunk Movements Neck, shoulders, hips: None, normal, Overall Severity Severity of abnormal movements (highest score from questions above): None, normal Incapacitation due to abnormal movements: None, normal Patient's awareness of abnormal movements (rate only patient's report): No Awareness, Dental Status Current problems with teeth and/or dentures?: No Does patient usually wear dentures?: No  CIWA:  CIWA-Ar Total: 2  COWS:     Treatment Plan Summary: Daily contact with patient to assess and evaluate symptoms and progress in treatment Medication management  Plan: No new changes made on the current treatment plan. Encouraged out of his room at meal times and to attend group sessions. Continue plan of care.    Medical Decision Making Problem Points:  Established problem, worsening (2) Data Points:  Review or order medicine tests (1)  I certify that inpatient services furnished can reasonably be expected to improve the patient's condition.  Armandina Stammer I MD 04/12/2012, 2:36 PM

## 2012-04-12 NOTE — Progress Notes (Signed)
Patient ID: [name], male   DOB: June 30, 1960, 51 y.o.   MRN: [id number]  D: Pt denies SI/HI/AVH. Pt is pleasant and cooperative. Pt said " not feeling good", would not elaborate...didn't want anything for feeling un easy  A: Pt was offered support and encouragement. Pt was given scheduled medications. Pt was encourage to attend groups. Q 15 minute checks were done for safety.    R:Pt attends groups and interacts well with peers and staff. Pt is taking medication..Pt receptive to treatment and safety maintained on unit.Marland Kitchen

## 2012-04-12 NOTE — Clinical Social Work Note (Signed)
[facility] LCSW Group Therapy  Type of Therapy:  Discharge Planning  Participation Level:  Minimal  Participation Quality:  Resistant  Affect:  Flat  Cognitive:  Oriented  Insight:  None shared  Engagement in Therapy:  Poor  Modes of Intervention:  Exploration, Reality Testing and Support  Summary of Progress/Problems: [place] communicated only when asked questions directly; presents with flat affect and appears to be trying to keep his eyes closed, although he will open one.  Derrel denies both SI and HI and rates his depression and anxiety both at a 9 on a scale of 1 - 10 with ten being the highest level ever experienced.   [name] 04/12/12, 9:55 AM

## 2012-04-12 NOTE — Progress Notes (Signed)
Patient ID: RONDARIUS KADRMAS, male   DOB: 1960/12/17, 51 y.o.   MRN: [id number]  D: Pt denies SI/HI/AVH. Pt is still depressed and continues blocking. Pt still has flat affect. Pt up and interacting in the milieu and attended group.   A: Pt was offered support and encouragement. Pt was given scheduled medications. Pt was encourage to attend groups. Q 15 minute checks were done for safety.    R:Pt attends groups and interacts well with peers and staff. Pt is taking medication. Pt has no complaints.Pt receptive to treatment and safety maintained on unit.

## 2012-04-12 NOTE — Progress Notes (Signed)
Psychoeducational Group Note  Date:  04/12/2012 Time:  2000  Group Topic/Focus:  Wrap-Up Group:   The focus of this group is to help patients review their daily goal of treatment and discuss progress on daily workbooks.  Participation Level:  None  Participation Quality:  Appropriate  Affect:  Appropriate  Cognitive:  Appropriate  Insight:  None  Engagement in Group:  None  Additional Comments:  Pt attended wrap-up group this evening but didn't participated.   [name] A 04/12/2012, 2:12 AM

## 2012-04-13 NOTE — Progress Notes (Signed)
[facility] MD Progress Note  04/13/2012 5:24 PM [name]  MRN:  [id number] Subjective:  "Im not too good." Diagnosis:  Bipolar 1 disorder   ADL's:  Intact  Sleep: Poor due to roommates snoring  Appetite:  Fair  Suicidal Ideation:  none Homicidal Ideation:  none AEB (as evidenced by): patient's affect, and verbal response.  Psychiatric Specialty Exam: Review of Systems  Constitutional: Negative.  Negative for fever, chills, weight loss, malaise/fatigue and diaphoresis.  HENT: Negative for congestion and sore throat.   Eyes: Negative for blurred vision, double vision and photophobia.  Respiratory: Negative for cough, shortness of breath and wheezing.   Cardiovascular: Negative for chest pain, palpitations and PND.  Gastrointestinal: Negative for heartburn, nausea, vomiting, abdominal pain, diarrhea and constipation.  Musculoskeletal: Negative for myalgias, joint pain and falls.  Neurological: Negative for dizziness, tingling, tremors, sensory change, speech change, focal weakness, seizures, loss of consciousness, weakness and headaches.  Endo/Heme/Allergies: Negative for polydipsia. Does not bruise/bleed easily.  Psychiatric/Behavioral: Negative for depression, suicidal ideas, hallucinations, memory loss and substance abuse. The patient is not nervous/anxious and does not have insomnia.     Blood pressure 119/87, pulse 77, temperature 98.4 F (36.9 C), temperature source Oral, resp. rate 16, height 5\' 11"  (1.803 m), weight 72.576 kg (160 lb).Body mass index is 22.32 kg/(m^2).  General Appearance: Disheveled  Eye Contact::  Poor  Speech:  Clear and Coherent  Volume:  Decreased  Mood:  Anxious and Depressed  Affect:  Congruent  Thought Process:  Coherent and Goal Directed  Orientation:  Full (Time, Place, and Person)  Thought Content:  Negative  Suicidal Thoughts:  Yes.  without intent/plan  Homicidal Thoughts:  No  Memory:  NA  Judgement:  Intact  Insight:  Lacking    Psychomotor Activity:  Decreased  Concentration:  Fair  Recall:  Fair  Akathisia:  No  Handed:    AIMS (if indicated):     Assets:  Communication Skills Desire for Improvement Housing Physical Health Social Support  Sleep:  Number of Hours: 6.25    Current Medications: Current Facility-Administered Medications  Medication Dose Route Frequency Provider Last Rate Last Dose  . acetaminophen (TYLENOL) tablet 650 mg  650 mg Oral Q6H PRN Shuvon Rankin, NP      . alum & mag hydroxide-simeth (MAALOX/MYLANTA) 200-200-20 MG/5ML suspension 30 mL  30 mL Oral Q4H PRN Shuvon Rankin, NP      . buPROPion (WELLBUTRIN XL) 24 hr tablet 150 mg  150 mg Oral Daily [name]   150 mg at 04/13/12 0805  . feeding supplement (RESOURCE BREEZE) liquid 1 Container  1 Container Oral BID BM Jeoffrey Massed, RD   1 Container at 04/12/12 1142  . lithium carbonate capsule 450 mg  450 mg Oral BID [name]   450 mg at 04/13/12 1652  . lurasidone (LATUDA) tablet 80 mg  80 mg Oral QPC supper [name]   80 mg at 04/13/12 1652  . magnesium hydroxide (MILK OF MAGNESIA) suspension 30 mL  30 mL Oral Daily PRN Shuvon Rankin, NP   30 mL at 04/13/12 0807  . multivitamin with minerals tablet 1 tablet  1 tablet Oral Daily Jeoffrey Massed, RD   1 tablet at 04/13/12 0805  . traZODone (DESYREL) tablet 100 mg  100 mg Oral QHS PRN Shuvon Rankin, NP   100 mg at 04/08/12 2137    Lab Results: No results found for this or any previous visit (from the past 48 hour(s)).  Physical Findings: AIMS: Facial and Oral Movements Muscles of Facial Expression:  (pt. in bed, eyes closed, no abnormal movements noted.) Lips and Perioral Area: None, normal Jaw: None, normal Tongue: None, normal,Extremity Movements Upper (arms, wrists, hands, fingers): None, normal Lower (legs, knees, ankles, toes): None, normal, Trunk Movements Neck, shoulders, hips: None, normal, Overall Severity Severity of abnormal movements (highest score from  questions above): None, normal Incapacitation due to abnormal movements: None, normal Patient's awareness of abnormal movements (rate only patient's report): No Awareness, Dental Status Current problems with teeth and/or dentures?: No Does patient usually wear dentures?: No  CIWA:  CIWA-Ar Total: 2  COWS:     Treatment Plan Summary: Daily contact with patient to assess and evaluate symptoms and progress in treatment Medication management  Plan: 1. Continue current plan of care at this time. 2. Will move patient to another room for peace and quiet. Medical Decision Making Problem Points:  Established problem, stable/improving (1) Data Points:  Review or order medicine tests (1)  I certify that inpatient services furnished can reasonably be expected to improve the patient's condition.  Rona Ravens. Leonidus Rowand PAC 04/13/2012, 5:24 PM

## 2012-04-13 NOTE — Clinical Social Work Psychosocial (Signed)
[facility] Group Notes:  (Clinical Social Work)  04/13/2012  11:15-11:45AM  Summary of Progress/Problems:   The main focus of today's process group was for the patient to identify ways in which they have in the past sabotaged their own recovery and reasons they may have done this/what they received from doing it.  We then worked to identify a specific plan to avoid doing this when discharged from the hospital for this admission.  The patient expressed that he is "not doing too good, can't get my thoughts together."  He would not say anything else.  Type of Therapy:  Group Therapy - Process  Participation Level:  Minimal  Participation Quality:  Attentive  Affect:  Blunted and Depressed  Cognitive:  Confused  Insight:  Limited  Engagement in Therapy:  Limited  Modes of Intervention:  Clarification, Education, Limit-setting, Problem-solving, Socialization, Support and Processing, Exploration, Discussion   Ambrose Mantle, LCSW 04/13/2012, 12:46 PM

## 2012-04-13 NOTE — Progress Notes (Signed)
Patient ID: [name], male   DOB: 13-Jun-1960, 51 y.o.   MRN: [id number] 04-13-12 @ 1440 nursing note:D: pt needs a CPAP machine.  A: RN called respiratory dept at [facility] and spoke with angie d. Requesting a CPAP mach she stated she would attempt to get one. R: RN received a call from dee w in respiratory therapy and she stated they do not provide cpap machine for [facility].

## 2012-04-13 NOTE — Progress Notes (Signed)
Patient ID: [name], male   DOB: [birth year], 51 y.o.   MRN: [id number] 04-13-12 @ 1150 nursing shift note: D: pt has been isolative most of the day. He stated he was constipated. A: staff has attempted to stimulate pt into participating in the milieu. He was given some MOM PRN for his constipation. R: no response with the PRN for the constipation. He continues to be isolative. He is refusing his nutritional supplement. H RN will monitor and Q 15 min ck's continue.

## 2012-04-13 NOTE — Progress Notes (Signed)
Patient ID: [name], male   DOB: 01-Jun-1960, 51 y.o.   MRN: [id number]  Problem: Bipolar Disorder  D: Patient with dull, flat affect but is cooperative with staff/peers. A: Monitor patient Q 15 minutes for safety, encourage peer interaction. Administer medications as ordered by MD, redirect behaviors as needed.  R: Patient denies SI or plans to harm himself at this time.

## 2012-04-13 NOTE — Progress Notes (Signed)
Psychoeducational Group Note  Date:  04/13/2012 Time:  2000  Group Topic/Focus:  Wrap-Up Group:   The focus of this group is to help patients review their daily goal of treatment and discuss progress on daily workbooks.  Participation Level:  None  Participation Quality:  Appropriate  Affect:  Appropriate  Cognitive:  Appropriate  Insight:  None  Engagement in Group:  None  Additional Comments:    Kaedence Connelly A 04/13/2012, 2:43 AM

## 2012-04-13 NOTE — Progress Notes (Signed)
Psychoeducational Group Note  Date:  04/13/2012 Time:0900am  Group Topic/Focus:  Identifying Needs:   The focus of this group is to help patients identify their personal needs that have been historically problematic and identify healthy behaviors to address their needs.  Participation Level:  Minimal  Participation Quality:  Inattentive  Affect:  Depressed  Cognitive:  Lacking  Insight:  Lacking  Engagement in Group:  Lacking  Additional Comments:  Inventory group   Valente David 04/13/2012,9:30 AM

## 2012-04-14 NOTE — Progress Notes (Signed)
D - Patient attended group tonight with minimal participation. Patient currently in bed with eyes closed, patient tends to isolate himself from others. Patient mood depressed with flat affect.  A - Offered patient encouragement and support through therapeutic conversations. Encouraged patient to speak with staff about any concerns or questions. Medications given as ordered. R - Patient safety maintained with Q 15 minute checks.

## 2012-04-14 NOTE — Progress Notes (Signed)
[name] MD Progress Note  04/14/2012 12:55 PM [name]  MRN:  [id number] Subjective: Says he is not sleeping and consequently can't think straight. 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 Cannot collect unemployment. Lives with his mother. Diagnosis:  Bipolar 1 disorder   ADL's:  Intact  Sleep: Poor due to roommates snoring  Appetite:  Fair  Suicidal Ideation:  none Homicidal Ideation:  none AEB (as evidenced by): patient's affect, and verbal response.  Psychiatric Specialty Exam: Review of Systems  Constitutional: Negative.  Negative for fever, chills, weight loss, malaise/fatigue and diaphoresis.  HENT: Negative for congestion and sore throat.   Eyes: Negative for blurred vision, double vision and photophobia.  Respiratory: Negative for cough, shortness of breath and wheezing.   Cardiovascular: Negative for chest pain, palpitations and PND.  Gastrointestinal: Negative for heartburn, nausea, vomiting, abdominal pain, diarrhea and constipation.  Musculoskeletal: Negative for myalgias, joint pain and falls.  Neurological: Negative for dizziness, tingling, tremors, sensory change, speech change, focal weakness, seizures, loss of consciousness, weakness and headaches.  Endo/Heme/Allergies: Negative for polydipsia. Does not bruise/bleed easily.  Psychiatric/Behavioral: Negative for depression, suicidal ideas, hallucinations, memory loss and substance abuse. The patient is not nervous/anxious and does not have insomnia.     Blood pressure 123/85, pulse 81, temperature 98.9 F (37.2 C), temperature source Oral, resp. rate 14, height 5\' 11"  (1.803 m), weight 72.576 kg (160 lb).Body mass index is 22.32 kg/(m^2).  General Appearance: Disheveled  Eye Contact::  Poor  Speech:  Clear and Coherent  Volume:  Decreased  Mood:  Anxious and Depressed  Affect:  Congruent  Thought Process:  Coherent and Goal Directed  Orientation:  Full (Time, Place, and Person)  Thought  Content:  Negative  Suicidal Thoughts:  Yes.  without intent/plan  Homicidal Thoughts:  No  Memory:  NA  Judgement:  Intact  Insight:  Lacking  Psychomotor Activity:  Decreased  Concentration:  Fair  Recall:  Fair  Akathisia:  No  Handed:    AIMS (if indicated):     Assets:  Communication Skills Desire for Improvement Housing Physical Health Social Support  Sleep:  Number of Hours: 5    Current Medications: Current Facility-Administered Medications  Medication Dose Route Frequency Provider Last Rate Last Dose  . acetaminophen (TYLENOL) tablet 650 mg  650 mg Oral Q6H PRN Shuvon Rankin, NP      . alum & mag hydroxide-simeth (MAALOX/MYLANTA) 200-200-20 MG/5ML suspension 30 mL  30 mL Oral Q4H PRN Shuvon Rankin, NP      . buPROPion (WELLBUTRIN XL) 24 hr tablet 150 mg  150 mg Oral Daily [name]   150 mg at 04/14/12 0758  . feeding supplement (RESOURCE BREEZE) liquid 1 Container  1 Container Oral BID BM Jeoffrey Massed, RD   1 Container at 04/12/12 1142  . lithium carbonate capsule 450 mg  450 mg Oral BID [name]   450 mg at 04/14/12 0758  . lurasidone (LATUDA) tablet 80 mg  80 mg Oral QPC supper [name]   80 mg at 04/13/12 1652  . magnesium hydroxide (MILK OF MAGNESIA) suspension 30 mL  30 mL Oral Daily PRN Shuvon Rankin, NP   30 mL at 04/13/12 0807  . multivitamin with minerals tablet 1 tablet  1 tablet Oral Daily Jeoffrey Massed, RD   1 tablet at 04/14/12 0758  . traZODone (DESYREL) tablet 100 mg  100 mg Oral QHS PRN Shuvon Rankin, NP   100 mg at 04/08/12  2137    Lab Results: No results found for this or any previous visit (from the past 48 hour(s)).  Physical Findings: AIMS: Facial and Oral Movements Muscles of Facial Expression: None, normal Lips and Perioral Area: None, normal Jaw: None, normal Tongue: None, normal,Extremity Movements Upper (arms, wrists, hands, fingers): None, normal Lower (legs, knees, ankles, toes): None, normal, Trunk  Movements Neck, shoulders, hips: None, normal, Overall Severity Severity of abnormal movements (highest score from questions above): None, normal Incapacitation due to abnormal movements: None, normal Patient's awareness of abnormal movements (rate only patient's report): No Awareness, Dental Status Current problems with teeth and/or dentures?: No Does patient usually wear dentures?: No  CIWA:  CIWA-Ar Total: 0  COWS:     Treatment Plan Summary: Daily contact with patient to assess and evaluate symptoms and progress in treatment Medication management SW needs to see what is available to him regarding income & housing.  Plan: 1. Continue current plan of care at this time. 2. Will move patient to another room for peace and quiet. Medical Decision Making Problem Points:  Established problem, stable/improving (1) Data Points:  Review or order medicine tests (1)  I certify that inpatient services furnished can reasonably be expected to improve the patient's condition.   Mickie D. Dailyn Reith RPA-C CAQ-Psych   04/14/2012, 12:55 PM

## 2012-04-14 NOTE — Progress Notes (Signed)
D   Pt isolates in his room and interacts minimally with others    He takes his medications without difficulty and has a good appetite   Pt continues to have poor hygiene and needs prompting  A   Verbal support given  Medications administered and effectiveness monitored  Encourage increased socialization  Q 15 min checks R   Pt safe at present

## 2012-04-14 NOTE — Progress Notes (Signed)
Psychoeducational Group Note  Date:  04/14/2012 Time:  0945  Group Topic/Focus:  Self Inventory  Participation Level:  None  Participation Quality:  Inattentive  Affect:  Blunted and Resistant  Cognitive:  unattentive  Insight:  None  Engagement in Group:  None  Additional Comments:    Bettejane Leavens Shari Prows 04/14/2012, 10:37 AM

## 2012-04-14 NOTE — Progress Notes (Signed)
[facility] Group Notes:  (Counselor/Nursing/MHT/Case Management/Adjunct)  04/14/2012 1:21 AM  Type of Therapy:  wrap up group  Participation Level:  None  Participation Quality:  Resistant  Affect:  Depressed  Cognitive:  Appropriate  Insight:  None  Engagement in Group:  None  Engagement in Therapy:  unknown  Modes of Intervention:  Support  Summary of Progress/Problems:  When asked about patient's day and his goal patient stated that he did not feel like participating.   [name] 04/14/2012, 1:21 AM

## 2012-04-14 NOTE — Progress Notes (Deleted)
1:1 Nursing Note. Patient laying in bed quietly with eyes closed. Patient complained of neck pain, warm pack given with relief. Patient calm and cooperative, no signs or symptoms of distress. 1:1 observation continued for patient safety. Patient remains safe at this time.

## 2012-04-14 NOTE — Clinical Social Work Psychosocial (Signed)
[facility] Group Notes:  (Clinical Social Work)  04/14/2012   3:00-4:00PM  Summary of Progress/Problems:   Summary of Progress/Problems:   The main focus of today's process group was for the patient to define "support" and describe what healthy supports are, then to identify the patient's current support system and decide on other supports that can be put in place to prevent future hospitalizations.  Roleplay was used to demonstrate definitions of different types of available supports.  An emphasis was placed on using therapist, doctor, therapy groups, self-help groups and problem-specific support groups to expand supports. Additionally, psychoeducation on various symptoms of mental illness was done at patients' requests.  The patient expressed that his parents are his supports, but spoke little as he has been doing in groups.  Type of Therapy:  Process Group  Participation Level:  Minimal  Participation Quality:  Attentive  Affect:  Depressed  Cognitive:  Oriented  Insight:  Improving  Engagement in Therapy:  Improving  Modes of Intervention:  Clarification, Education, Limit-setting, Problem-solving, Socialization, Support and Processing, Exploration, Discussion, Role-Play   Ambrose Mantle, LCSW 04/14/2012, 4:04 PM

## 2012-04-15 MED ORDER — BUPROPION HCL ER (XL) 150 MG PO TB24
150.0000 mg | ORAL_TABLET | ORAL | Status: DC
  Administered 2012-04-16: 150 mg via ORAL
  Filled 2012-04-15 (×2): qty 1

## 2012-04-15 NOTE — Progress Notes (Signed)
Patient ID: [name], male   DOB: April 19, 1960, 51 y.o.   MRN: [id number] [facility] MD Progress Note  04/15/2012 2:34 PM [name]  MRN:  [id number] Subjective:  "Im not too good." Diagnosis:  Bipolar 1 disorder   ADL's:  Intact  Sleep: Poor   Appetite:  Fair  Suicidal Ideation:  none Homicidal Ideation:  none AEB (as evidenced by): patient's affect, and verbal response.  Psychiatric Specialty Exam: Review of Systems  Constitutional: Negative.  Negative for fever, chills, weight loss, malaise/fatigue and diaphoresis.  HENT: Negative for congestion and sore throat.   Eyes: Negative for blurred vision, double vision and photophobia.  Respiratory: Negative for cough, shortness of breath and wheezing.   Cardiovascular: Negative for chest pain, palpitations and PND.  Gastrointestinal: Negative for heartburn, nausea, vomiting, abdominal pain, diarrhea and constipation.  Musculoskeletal: Negative for myalgias, joint pain and falls.  Neurological: Negative for dizziness, tingling, tremors, sensory change, speech change, focal weakness, seizures, loss of consciousness, weakness and headaches.  Endo/Heme/Allergies: Negative for polydipsia. Does not bruise/bleed easily.  Psychiatric/Behavioral: Negative for depression, suicidal ideas, hallucinations, memory loss and substance abuse. The patient is not nervous/anxious and does not have insomnia.     Blood pressure 133/90, pulse 75, temperature 98.3 F (36.8 C), temperature source Oral, resp. rate 14, height 5\' 11"  (1.803 m), weight 72.576 kg (160 lb).Body mass index is 22.32 kg/(m^2).  General Appearance: Disheveled  Eye Contact::  Poor  Speech:  Clear and Coherent  Volume:  Decreased  Mood:  Anxious and Depressed  Affect:  Congruent  Thought Process:  Coherent and Goal Directed  Orientation:  Full (Time, Place, and Person)  Thought Content:  Negative  Suicidal Thoughts:  Yes.  without intent/plan  Homicidal Thoughts:  No    Memory:  NA  Judgement:  Intact  Insight:  Lacking  Psychomotor Activity:  Decreased  Concentration:  Fair  Recall:  Fair  Akathisia:  No  Handed:    AIMS (if indicated):     Assets:  Communication Skills Desire for Improvement Housing Physical Health Social Support  Sleep:  Number of Hours: 6    Current Medications: Current Facility-Administered Medications  Medication Dose Route Frequency Provider Last Rate Last Dose  . acetaminophen (TYLENOL) tablet 650 mg  650 mg Oral Q6H PRN Shuvon Rankin, NP      . alum & mag hydroxide-simeth (MAALOX/MYLANTA) 200-200-20 MG/5ML suspension 30 mL  30 mL Oral Q4H PRN Shuvon Rankin, NP      . buPROPion (WELLBUTRIN XL) 24 hr tablet 150 mg  150 mg Oral Daily [name]   150 mg at 04/15/12 0821  . feeding supplement (RESOURCE BREEZE) liquid 1 Container  1 Container Oral BID BM Jeoffrey Massed, RD   1 Container at 04/12/12 1142  . lithium carbonate capsule 450 mg  450 mg Oral BID [name]   450 mg at 04/15/12 0821  . lurasidone (LATUDA) tablet 80 mg  80 mg Oral QPC supper [name]   80 mg at 04/14/12 1716  . magnesium hydroxide (MILK OF MAGNESIA) suspension 30 mL  30 mL Oral Daily PRN Shuvon Rankin, NP   30 mL at 04/13/12 0807  . multivitamin with minerals tablet 1 tablet  1 tablet Oral Daily Jeoffrey Massed, RD   1 tablet at 04/15/12 7846  . traZODone (DESYREL) tablet 100 mg  100 mg Oral QHS PRN Shuvon Rankin, NP   100 mg at 04/08/12 2137    Lab Results: No  results found for this or any previous visit (from the past 48 hour(s)).  Physical Findings: AIMS: Facial and Oral Movements Muscles of Facial Expression: None, normal Lips and Perioral Area: None, normal Jaw: None, normal Tongue: None, normal,Extremity Movements Upper (arms, wrists, hands, fingers): None, normal Lower (legs, knees, ankles, toes): None, normal, Trunk Movements Neck, shoulders, hips: None, normal, Overall Severity Severity of abnormal movements (highest  score from questions above): None, normal Incapacitation due to abnormal movements: None, normal Patient's awareness of abnormal movements (rate only patient's report): No Awareness, Dental Status Current problems with teeth and/or dentures?: No Does patient usually wear dentures?: No  CIWA:  CIWA-Ar Total: 0  COWS:     Treatment Plan Summary: Daily contact with patient to assess and evaluate symptoms and progress in treatment Medication management  Plan: 1. [name] is demonstrating an increase in symptoms, with poor eye contact, poor sleep, flatter affect, poor ADLs.  2. Will order Thyroid panel to monitor for organic reason for depression.  Patient states he can't think of another medication that has worked for him in the past. Would consider increasing the Wellbutrin to 300mg  each am. Consider Lovaza for Omega 3 support as well. 3. Will also evaluate vitamin D level. Medical Decision Making Problem Points:  Established problem, stable/improving (1) Data Points:  Review or order medicine tests (1)  I certify that inpatient services furnished can reasonably be expected to improve the patient's condition.  Rona Ravens. Yonis Carreon PAC 04/15/2012, 2:34 PM

## 2012-04-15 NOTE — Social Work (Signed)
Pt attended discharge planning group and actively participated in group.  CSW provided pt with today's workbook. Suicide prevention and education was also discussed in today's group. Patient reports he is having a hard time thinking and concentrating but denies being suicidal. Patient rates himself in 8-9 across the board for anxiety, depression, helplessness and hopelessness. Patient reports he is currently unable to function in anyway.

## 2012-04-15 NOTE — Progress Notes (Signed)
Patient ID: [name], male   DOB: 1960/05/12, 51 y.o.   MRN: [id number] Patient reports poor sleep and improving appetite.  He reports his energy level is low and his ability to pay attention is poor.  He rates his depression and his hopelessness a 5.  He denies thoughts of hurting himself.  A- Gave patient his medication this am and he took only a very small amount of water and walked away.  Followed patient , gave him more water and asked to check his mouth.  He did swallow his meds.  Patient answers questions briefly.  He forwards little.  He is attending groups and sits in the dayroom between groups.  Marland Kitchen

## 2012-04-15 NOTE — Progress Notes (Signed)
[facility] LCSW Group Therapy  04/15/2012 4:08 PM  Type of Therapy:  Group Therapy  Participation Level:  Did Not Attend    [name], Marc Gravel 04/15/2012, 4:08 PM

## 2012-04-15 NOTE — Progress Notes (Signed)
[facility] Group Notes:  (Counselor/Nursing/MHT/Case Management/Adjunct)  04/15/2012 8:05PM  Type of Therapy:  Psychoeducational Skills  Participation Level:  Minimal  Participation Quality:  Appropriate and Resistant  Affect:  Flat  Cognitive:  Appropriate  Insight:  Limited  Engagement in Group:  Limited  Engagement in Therapy:  Limited  Modes of Intervention:  Wrap-Up Group  Summary of Progress/Problems: Pt said that he had an okay day. Pt said that he enjoyed his visit with his mother today during lunch time. Pt said that he went to all of his groups today. Pt said that he is going to work on participating more during group time. Pt said that he hopes that his medications will help him  [name] K 04/15/2012, 10:25 PM

## 2012-04-16 MED ORDER — BUPROPION HCL ER (XL) 300 MG PO TB24
300.0000 mg | ORAL_TABLET | ORAL | Status: DC
  Administered 2012-04-17 – 2012-04-19 (×3): 300 mg via ORAL
  Filled 2012-04-16: qty 14
  Filled 2012-04-16 (×4): qty 1

## 2012-04-16 NOTE — Progress Notes (Signed)
Currently resting quietly in bed with eyes closed. Respirations are even and unlabored. No acute distress noted. Safety has been maintained with Q15 minute observation. Will continue Q45min observation and continue current POC

## 2012-04-16 NOTE — Progress Notes (Signed)
D: Pt. Has been visible on the unit. Minimal interaction with peers & staff. Affect blunted & mood sad & depressed.A: Continues on 15 minute checks. Supported & encouraged.R: pt safety maintained.

## 2012-04-16 NOTE — Progress Notes (Signed)
Psychoeducational Group Note  Date:  04/16/2012 Time:  1000  Group Topic/Focus:  Coping Skills Pictionary  Participation Level:  Minimal  Participation Quality:  Inattentive  Affect:  Flat  Cognitive:  Appropriate  Insight:  None  Engagement in Group:  None  Additional Comments:  Pt was very limited in his interactions. Pt did not share a coping skill and was not willing to draw a coping skill for pictionary.   Sharyn Lull 04/16/2012, 10:47 AM

## 2012-04-16 NOTE — Social Work (Signed)
[facility] LCSW Aftercare Discharge Planning Group Note  04/16/2012  8:45 AM  Participation Quality:  Attentive and Sharing  Affect:  Blunted, Depressed and Flat  Cognitive:  Oriented  Insight:  Engaged  Engagement in Group:  Improving  Modes of Intervention:  Discussion, Education and Support  Summary of Progress/Problems: Pt attended discharge planning group and minimally participated in group.  CSW provided pt with today's workbook. Patient reported he is not doing well today and has not been sleeping well. Patient stated that his daily cycle at home is vastly different from that the hospital and says this is causing him problems. Patient denies having any suicidal or homicidal ideations but does rank himself a 7 for depression and anxiety, and a 54 helplessness and hopelessness.  04/16/2012 1:15 PM  Type of Therapy:  Group Therapy  Participation Level:  Minimal  Participation Quality:  Attentive  Affect:  Blunted, Depressed and Flat  Cognitive:  Appropriate  Insight:  Improving  Engagement in Therapy:  Improving  Modes of Intervention:  Discussion, Education, Socialization and Support  Summary of Progress/Problems: Patient participated minimally in group session focused on feelings about diagnosis. Patient reports it is a daily struggle to manage his depression and described himself as feeling like he is in a black hole. Patient reports his family is open and supportive and says that helps him in coping with his diagnosis.   Patton Salles LCSW 04/16/2012 6:52 AM

## 2012-04-16 NOTE — Progress Notes (Signed)
Patient ID: [name], male   DOB: 05/22/1960, 51 y.o.   MRN: [id number] CSW spoke with patient's mother, [name], on 04/15/12 as she was here to have lunch with patient and neither parent had shown to meet with treatment team at 10:30 as planned.  Mother is accepting that patient's depression is more severe this time than in past and accepts that it is taking him longer to return to baseline.  No further questions for treatment team.  Nelta Numbers Clinical Social Worker [phone]

## 2012-04-16 NOTE — Progress Notes (Signed)
[facility] Group Notes:  (Counselor/Nursing/MHT/Case Management/Adjunct)  04/16/2012 8:10PM  Type of Therapy:  Psychoeducational Skills  Participation Level:  None  Participation Quality:  Resistant  Affect:  Flat  Cognitive:  Appropriate  Insight:  None  Engagement in Group:  None  Engagement in Therapy:  None  Modes of Intervention:  Wrap-Up Group  Summary of Progress/Problems: Pt attended wrap-up group but did not share anything about his day. Pt told staff that he did not want to share anything during group time. Pt was quiet throughout group  Eileen Croswell K 04/16/2012, 10:14 PM

## 2012-04-16 NOTE — Progress Notes (Signed)
D:  Patient up and in the dayroom some.  Does not interact much, but does attend some of the groups.  Father came at lunchtime today and states that patient shared with him that he is scared that the parole board will come here to pick him up and take him to jail for a long time.  Father shared with me that he has had weekly contact with the parole officer and there is no danger of this happening.  A:  Informed patient that he would not be taken to jail from here.  Also gave him a note written by his father that also states he has been in contact with the parole officer and that they do not want the patient to go back to jail.   R:  Remains flat and sad, but was receptive to information that was given him about his status.  Cooperative with staff.

## 2012-04-16 NOTE — Progress Notes (Signed)
Psychoeducational Group Note  Date:  04/16/2012 Time:  1100 hrs  Group Topic/Focus:  Therapeutic Activity: Question Ball  Participation Level:  Minimal  Participation Quality:  Appropriate  Affect:  Blunted  Cognitive:  Alert  Insight:  Engaged  Engagement in Group:  Limited  Additional Comments:    Barton Fanny 04/16/2012, 3:32 PM

## 2012-04-17 LAB — LITHIUM LEVEL: Lithium Lvl: 0.83 mEq/L (ref 0.80–1.40)

## 2012-04-17 LAB — VITAMIN B12: Vitamin B-12: 509 pg/mL (ref 211–911)

## 2012-04-17 NOTE — Progress Notes (Signed)
Patient ID: [name], male   DOB: [birth year], 52 y.o.   MRN: [id number] [facility] MD Progress Note  04/17/2012 12:18 PM EMMANUEL GRUENHAGEN  MRN:  [id number] Subjective: "I'm a little better today." Met with the patient to discuss his response to treatment. Diagnosis:  Bipolar 1 disorder   ADL's:  Intact  Sleep:"decent" Appetite:  Fair  Suicidal Ideation:  none Homicidal Ideation:  none AEB (as evidenced by): patient's affect, and verbal response.  Psychiatric Specialty Exam: Review of Systems  Constitutional: Negative.  Negative for fever, chills, weight loss, malaise/fatigue and diaphoresis.  HENT: Negative for congestion and sore throat.   Eyes: Negative for blurred vision, double vision and photophobia.  Respiratory: Negative for cough, shortness of breath and wheezing.   Cardiovascular: Negative for chest pain, palpitations and PND.  Gastrointestinal: Negative for heartburn, nausea, vomiting, abdominal pain, diarrhea and constipation.  Musculoskeletal: Negative for myalgias, joint pain and falls.  Neurological: Negative for dizziness, tingling, tremors, sensory change, speech change, focal weakness, seizures, loss of consciousness, weakness and headaches.  Endo/Heme/Allergies: Negative for polydipsia. Does not bruise/bleed easily.  Psychiatric/Behavioral: Negative for depression, suicidal ideas, hallucinations, memory loss and substance abuse. The patient is not nervous/anxious and does not have insomnia.     Blood pressure 127/82, pulse 60, temperature 98.5 F (36.9 C), temperature source Oral, resp. rate 20, height 5\' 11"  (1.803 m), weight 72.576 kg (160 lb).Body mass index is 22.32 kg/(m^2).  General Appearance: little more groomed than yesterday  Eye Contact::  fair  Speech:  Clear and Coherent  Volume:  Decreased  Mood:  Anxious and Depressed  Affect:  Congruent but brighter today.  Thought Process:  Coherent and Goal Directed  Orientation:  Full (Time, Place, and Person)   Thought Content:  Negative  Suicidal Thoughts:  Yes.  without intent/plan  Homicidal Thoughts:  No  Memory:  NA  Judgement:  Intact  Insight:  Lacking  Psychomotor Activity:  Decreased  Concentration:  Fair  Recall:  Fair  Akathisia:  No  Handed:    AIMS (if indicated):     Assets:  Communication Skills Desire for Improvement Housing Physical Health Social Support  Sleep:  Number of Hours: 7    Current Medications: Current Facility-Administered Medications  Medication Dose Route Frequency Provider Last Rate Last Dose  . acetaminophen (TYLENOL) tablet 650 mg  650 mg Oral Q6H PRN Shuvon Rankin, NP      . alum & mag hydroxide-simeth (MAALOX/MYLANTA) 200-200-20 MG/5ML suspension 30 mL  30 mL Oral Q4H PRN Shuvon Rankin, NP      . buPROPion (WELLBUTRIN XL) 24 hr tablet 150 mg  150 mg Oral Daily [name]   150 mg at 04/15/12 0821  . feeding supplement (RESOURCE BREEZE) liquid 1 Container  1 Container Oral BID BM Jeoffrey Massed, RD   1 Container at 04/12/12 1142  . lithium carbonate capsule 450 mg  450 mg Oral BID [name]   450 mg at 04/15/12 0821  . lurasidone (LATUDA) tablet 80 mg  80 mg Oral QPC supper [name]   80 mg at 04/14/12 1716  . magnesium hydroxide (MILK OF MAGNESIA) suspension 30 mL  30 mL Oral Daily PRN Shuvon Rankin, NP   30 mL at 04/13/12 0807  . multivitamin with minerals tablet 1 tablet  1 tablet Oral Daily Jeoffrey Massed, RD   1 tablet at 04/15/12 7829  . traZODone (DESYREL) tablet 100 mg  100 mg Oral QHS PRN Assunta Found, NP  100 mg at 04/08/12 2137    Lab Results: No results found for this or any previous visit (from the past 48 hour(s)).  Physical Findings: AIMS: Facial and Oral Movements Muscles of Facial Expression: None, normal Lips and Perioral Area: None, normal Jaw: None, normal Tongue: None, normal,Extremity Movements Upper (arms, wrists, hands, fingers): None, normal Lower (legs, knees, ankles, toes): None, normal, Trunk  Movements Neck, shoulders, hips: None, normal, Overall Severity Severity of abnormal movements (highest score from questions above): None, normal Incapacitation due to abnormal movements: None, normal Patient's awareness of abnormal movements (rate only patient's report): No Awareness, Dental Status Current problems with teeth and/or dentures?: No Does patient usually wear dentures?: No  CIWA:  CIWA-Ar Total: 0  COWS:     Treatment Plan Summary: Daily contact with patient to assess and evaluate symptoms and progress in treatment Medication management  Plan: 1.Patient notified that Thyroid panel is normal, and the folate, B12, Vit. D are still pending. 2.Also a Lithium level is being ordered for tonight. 3. Encouraged patient to discuss with his family a time to meet with CM and Tx team on Thursday to discuss plans for follow up care and discharge. 4. Will continue to follow. Medical Decision Making Problem Points:  Established problem, stable/improving (1) Data Points:  Review or order medicine tests (1)  I certify that inpatient services furnished can reasonably be expected to improve the patient's condition.  Rona Ravens. Tanisia Yokley PAC 04/17/2012, 12:18 PM

## 2012-04-17 NOTE — Progress Notes (Signed)
Patient declines his self inventory; patient denies SI/HI and A/V hallucinations; patient is flat and blunted and denies depression and anxiety; patient forwards little and is cautious; patient is minimal in interaction with peers and staff; patient is attending the groups on the unit

## 2012-04-17 NOTE — Progress Notes (Signed)
Pt attended group today and participated in New Year activities.  

## 2012-04-17 NOTE — Progress Notes (Signed)
Patient ID: [name], male   DOB: [birth year], 52 y.o.   MRN: [id number] [facility] MD Progress Note Late entry 04/16/2012 [name]  MRN:  [id number] Subjective:  "Im about the same." Met with the patient to discuss his treatment. Diagnosis:  Bipolar 1 disorder   ADL's:  Intact  Sleep: Poor   Appetite:  Fair  Suicidal Ideation:  none Homicidal Ideation:  none AEB (as evidenced by): patient's affect, and verbal response.  Psychiatric Specialty Exam: Review of Systems  Constitutional: Negative.  Negative for fever, chills, weight loss, malaise/fatigue and diaphoresis.  HENT: Negative for congestion and sore throat.   Eyes: Negative for blurred vision, double vision and photophobia.  Respiratory: Negative for cough, shortness of breath and wheezing.   Cardiovascular: Negative for chest pain, palpitations and PND.  Gastrointestinal: Negative for heartburn, nausea, vomiting, abdominal pain, diarrhea and constipation.  Musculoskeletal: Negative for myalgias, joint pain and falls.  Neurological: Negative for dizziness, tingling, tremors, sensory change, speech change, focal weakness, seizures, loss of consciousness, weakness and headaches.  Endo/Heme/Allergies: Negative for polydipsia. Does not bruise/bleed easily.  Psychiatric/Behavioral: Negative for depression, suicidal ideas, hallucinations, memory loss and substance abuse. The patient is not nervous/anxious and does not have insomnia.     Blood pressure 127/82, pulse 60, temperature 98.5 F (36.9 C), temperature source Oral, resp. rate 20, height 5\' 11"  (1.803 m), weight 72.576 kg (160 lb).Body mass index is 22.32 kg/(m^2).  General Appearance: Disheveled  Eye Contact::  Poor  Speech:  Clear and Coherent  Volume:  Decreased  Mood:  Anxious and Depressed  Affect:  Congruent  Thought Process:  Coherent and Goal Directed  Orientation:  Full (Time, Place, and Person)  Thought Content:  Negative  Suicidal Thoughts:  Yes.   without intent/plan  Homicidal Thoughts:  No  Memory:  NA  Judgement:  Intact  Insight:  Lacking  Psychomotor Activity:  Decreased  Concentration:  Fair  Recall:  Fair  Akathisia:  No  Handed:    AIMS (if indicated):     Assets:  Communication Skills Desire for Improvement Housing Physical Health Social Support  Sleep:  Number of Hours: 7    Current Medications: Current Facility-Administered Medications  Medication Dose Route Frequency Provider Last Rate Last Dose  . acetaminophen (TYLENOL) tablet 650 mg  650 mg Oral Q6H PRN Shuvon Rankin, NP      . alum & mag hydroxide-simeth (MAALOX/MYLANTA) 200-200-20 MG/5ML suspension 30 mL  30 mL Oral Q4H PRN Shuvon Rankin, NP      . buPROPion (WELLBUTRIN XL) 24 hr tablet 150 mg  150 mg Oral Daily [name]   150 mg at 04/15/12 0821  . feeding supplement (RESOURCE BREEZE) liquid 1 Container  1 Container Oral BID BM Jeoffrey Massed, RD   1 Container at 04/12/12 1142  . lithium carbonate capsule 450 mg  450 mg Oral BID [name]   450 mg at 04/15/12 0821  . lurasidone (LATUDA) tablet 80 mg  80 mg Oral QPC supper [name]   80 mg at 04/14/12 1716  . magnesium hydroxide (MILK OF MAGNESIA) suspension 30 mL  30 mL Oral Daily PRN Shuvon Rankin, NP   30 mL at 04/13/12 0807  . multivitamin with minerals tablet 1 tablet  1 tablet Oral Daily Jeoffrey Massed, RD   1 tablet at 04/15/12 8413  . traZODone (DESYREL) tablet 100 mg  100 mg Oral QHS PRN Shuvon Rankin, NP   100 mg at 04/08/12 2137  Lab Results: No results found for this or any previous visit (from the past 48 hour(s)).  Physical Findings: AIMS: Facial and Oral Movements Muscles of Facial Expression: None, normal Lips and Perioral Area: None, normal Jaw: None, normal Tongue: None, normal,Extremity Movements Upper (arms, wrists, hands, fingers): None, normal Lower (legs, knees, ankles, toes): None, normal, Trunk Movements Neck, shoulders, hips: None, normal, Overall  Severity Severity of abnormal movements (highest score from questions above): None, normal Incapacitation due to abnormal movements: None, normal Patient's awareness of abnormal movements (rate only patient's report): No Awareness, Dental Status Current problems with teeth and/or dentures?: No Does patient usually wear dentures?: No  CIWA:  CIWA-Ar Total: 0  COWS:     Treatment Plan Summary: Daily contact with patient to assess and evaluate symptoms and progress in treatment Medication management  Plan: 1. Reviewed the patient's thyroid panel results with him. 2. Did inform the patient that folate, and B12 would also be drawn to make sure that these possible causes of depression weren't missed. 3. Also discussed the use of ECT and TMS for depression as well and will provide him with information on these options for treatment. 4. Encouraged the patient to participate in talk therapy and to seek out his CM or counselor for this. 5. Also encouraged the patient to speak with his parents and fix a time for treatment team and family and patient to meet to discuss his care and plans for discharge.   Medical Decision Making Problem Points:  Established problem, stable/improving (1) Data Points:  Review or order medicine tests (1)  I certify that inpatient services furnished can reasonably be expected to improve the patient's condition.  Rona Ravens. Zylon Creamer PAC 04/17/2012, 9:41 AM

## 2012-04-18 LAB — FOLATE RBC: RBC Folate: 289 ng/mL — ABNORMAL LOW (ref >=366)

## 2012-04-18 NOTE — Progress Notes (Signed)
Patient did not attend the evening karaoke group. Pt remained in bed.   

## 2012-04-18 NOTE — Progress Notes (Signed)
Patient ID: [name], male   DOB: April 23, 1960, 52 y.o.   MRN: [id number] Patient ID: [name], male   DOB: 16-Apr-1961, 52 y.o.   MRN: [id number] [facility] MD Progress Note  04/18/2012 11:24 AM REMER COUSE  MRN:  [id number]  Subjective: "I have been here since the 13th of December due to my depression. I am feeling a lot better now. My depression rate today is #3. I have no anxiety.  My sleep is so, so. My appetite is better".  Diagnosis:  Bipolar 1 disorder  ADL's:  Intact  Sleep:"so, so"  Appetite:  "It's getting better".  Suicidal Ideation: "No" none Homicidal Ideation:  none AEB (as evidenced by): patient's reports.  Psychiatric Specialty Exam: Review of Systems  Constitutional: Negative.   HENT: Negative.   Eyes: Negative.   Respiratory: Negative.   Cardiovascular: Negative.   Gastrointestinal: Negative.   Genitourinary: Negative.   Musculoskeletal: Negative.   Skin: Negative.   Neurological: Negative.   Endo/Heme/Allergies: Negative.   Psychiatric/Behavioral: Positive for depression (Hx of, currently being stabilized with medication.). Negative for suicidal ideas, hallucinations, memory loss and substance abuse. The patient is not nervous/anxious and does not have insomnia.     Blood pressure 131/91, pulse 83, temperature 98.6 F (37 C), temperature source Oral, resp. rate 20, height 5\' 11"  (1.803 m), weight 72.576 kg (160 lb).Body mass index is 22.32 kg/(m^2).  General Appearance: little more groomed than yesterday  Eye Contact::  fair  Speech:  Clear and Coherent  Volume:  Decreased  Mood:  Anxious and Depressed  Affect:  Congruent  Thought Process:  Coherent and Goal Directed  Orientation:  Full (Time, Place, and Person)  Thought Content:  Denies hallucinations, delusions and or paranoia.  Suicidal Thoughts:  "No"  Homicidal Thoughts:  No  Memory:  With norm  Judgement:  Intact  Insight:  Lacking  Psychomotor Activity:  Denies restlessness and or  anxiety.  Concentration:  Fair  Recall:  Fair  Akathisia:  No  Handed:    AIMS (if indicated):     Assets:  Communication Skills Desire for Improvement Housing Physical Health Social Support  Sleep:  Number of Hours: 6.75    Current Medications: Current Facility-Administered Medications  Medication Dose Route Frequency Provider Last Rate Last Dose  . acetaminophen (TYLENOL) tablet 650 mg  650 mg Oral Q6H PRN Shuvon Rankin, NP      . alum & mag hydroxide-simeth (MAALOX/MYLANTA) 200-200-20 MG/5ML suspension 30 mL  30 mL Oral Q4H PRN Shuvon Rankin, NP      . buPROPion (WELLBUTRIN XL) 24 hr tablet 150 mg  150 mg Oral Daily [name]   150 mg at 04/15/12 0821  . feeding supplement (RESOURCE BREEZE) liquid 1 Container  1 Container Oral BID BM Jeoffrey Massed, RD   1 Container at 04/12/12 1142  . lithium carbonate capsule 450 mg  450 mg Oral BID [name]   450 mg at 04/15/12 0821  . lurasidone (LATUDA) tablet 80 mg  80 mg Oral QPC supper [name]   80 mg at 04/14/12 1716  . magnesium hydroxide (MILK OF MAGNESIA) suspension 30 mL  30 mL Oral Daily PRN Shuvon Rankin, NP   30 mL at 04/13/12 0807  . multivitamin with minerals tablet 1 tablet  1 tablet Oral Daily Jeoffrey Massed, RD   1 tablet at 04/15/12 2952  . traZODone (DESYREL) tablet 100 mg  100 mg Oral QHS PRN Assunta Found, NP  100 mg at 04/08/12 2137    Lab Results: Lithium level reports of 04/17/12 is .83.  Physical Findings: AIMS: Facial and Oral Movements Muscles of Facial Expression: None, normal Lips and Perioral Area: None, normal Jaw: None, normal Tongue: None, normal,Extremity Movements Upper (arms, wrists, hands, fingers): None, normal Lower (legs, knees, ankles, toes): None, normal, Trunk Movements Neck, shoulders, hips: None, normal, Overall Severity Severity of abnormal movements (highest score from questions above): None, normal Incapacitation due to abnormal movements: None, normal Patient's  awareness of abnormal movements (rate only patient's report): No Awareness, Dental Status Current problems with teeth and/or dentures?: No Does patient usually wear dentures?: No  CIWA:  CIWA-Ar Total: 0  COWS:     Treatment Plan Summary: Daily contact with patient to assess and evaluate symptoms and progress in treatment Medication management  Plan: Reviewed current lab reports, Lithium levels at 0. 83 No changes made on the current treatment plan. Continue current plan of care.  Medical Decision Making Problem Points:  Established problem, stable/improving (1) Data Points:  Review or order medicine tests (1)  I certify that inpatient services furnished can reasonably be expected to improve the patient's condition.  Rona Ravens. Mashburn PAC 04/18/2012, 11:24 AM

## 2012-04-18 NOTE — Progress Notes (Signed)
Psychoeducational Group Note  Date:  04/18/2012 Time:  1000  Group Topic/Focus:  Therapeutic Activity  Participation Level:  Active  Participation Quality:  Appropriate, Attentive and Sharing  Affect:  Appropriate  Cognitive:  Appropriate  Insight:  Engaged  Engagement in Group:  Engaged  Additional Comments:  Patient attended group and participated in the therapeutic activity.  Karleen Hampshire Brittini 04/18/2012, 11:25 AM

## 2012-04-18 NOTE — Progress Notes (Signed)
Patient ID: [name], male   DOB: 26-May-1960, 52 y.o.   MRN: [id number] D: Pt. Standing in hall, no interaction noted. Pt. Reports depression at "5" of 10. Pt. Says his goal today was "just to have a better day" and indicates that the day has been better. D: Writer introduced self to client and instructed him to let her know if he was having any complications. Staff will monitor q32min for safety. Pt. Encouraged to attend group.  R: Pt. Is safe on the unit, attended group.

## 2012-04-18 NOTE — Clinical Social Work Note (Signed)
[facility] LCSW Group Therapy            04/18/2012 2:33 PM    Type of Therapy:  Group Therapy  Participation Level:  Appropriate  Participation Quality:  Appropriate  Affect:  Flat, depressed  Cognitive:  Attentive Appropriate  Insight:  Engaged  Engagement in Therapy:  Engaged  Modes of Intervention:  Discussion Exploration Problem-Solving Supportive  Summary of Progress/Problems:  Patient listened attentively to speaker from [employer].  He shared that he is a kind person and enjoys playing the guitar.   Wynn Banker 04/18/2012 2:33 PM

## 2012-04-18 NOTE — Progress Notes (Signed)
Patient ID: BRYTEN [name], male   DOB: July 24, 1960, 52 y.o.   MRN: [id number]  D: Pt denies SI/HI/AVH. Pt is pleasant and cooperative. Pt mood has improved since admittance.  A: Pt was offered support and encouragement. Pt was given scheduled medications. Pt was encourage to attend groups. Q 15 minute checks were done for safety.    R:Pt attends groups and interacts better with peers and staff. Pt is taking medication. Pt has no complaints at this time. Pt receptive to treatment and safety maintained on unit.

## 2012-04-18 NOTE — Clinical Social Work Note (Signed)
[facility] LCSW Group Therapy   Type of Therapy:  Discharge Planning  Participation Level:  Active  Participation Quality:  Appropriate  Affect:  Much improved  Cognitive:  Appropriate  Insight:  Improving  Engagement in Therapy:  Improving  Modes of Intervention:  Clarification, Exploration, Rapport Building and Socialization  Summary of Progress/Problems:  Pt denies both suicidal and homicidal ideation.  On a scale of 1 to 10 with ten being the most ever experienced, the patient rates depression at a 4 and anxiety at a 2. [name] that a goal of his for 3014 will be to "get back on track."    [name] 04/18/2012 4:02 PM

## 2012-04-18 NOTE — Progress Notes (Signed)
D:  Patient up and more active in the milieu today.  Affect brighter.  Rates depression at a 4 today and hopelessness at 3, both much lower that previous days.  More engaged in groups today.  Denies any thoughts of self harm.  A:  Medications as ordered.  Encouraged patient to continue medications and groups.  R:  Brighter today.  Patient is safe on the unit.  Interacting well with staff and peers.

## 2012-04-18 NOTE — Clinical Social Work Note (Signed)
Patient ID: [name], male   DOB: 10/04/1960, 52 y.o.   MRN: [id number]  Patient's parents came into hospital to meet with [name] as she had apparently set up appointment with them for 11:00 today. As Ms [name] was out sick and [name] had prior appointments [name] checked in with parents only briefly. Parents had a lengthy list of questions to ask the PA which has been placed in the shadow chart.  [name] has been able to fill several requests which were noted including: 1. Appointment at [name]. [name] notified as per request. 2. [name] will provide packet of information by which family can obtain medical power of attorney on Friday 1/3 if available from main hospital; otherwise [name] will post to family by [facility] the following week.  3. [name] has requested counseling appointment as requested by parents. Request made at [facility] as patient stated he would prefer to go to [facility] for medication management and counseling verses two different locations.  [name], [name] Clinical Social Worker [phone]

## 2012-04-19 DIAGNOSIS — F3130 Bipolar disorder, current episode depressed, mild or moderate severity, unspecified: Principal | ICD-10-CM

## 2012-04-19 MED ORDER — TRAZODONE HCL 100 MG PO TABS: 100.0000 mg | ORAL_TABLET | Freq: Every evening | ORAL | Status: DC | PRN

## 2012-04-19 MED ORDER — LURASIDONE HCL 80 MG PO TABS: 80.0000 mg | ORAL_TABLET | Freq: Every day | ORAL | Status: DC

## 2012-04-19 MED ORDER — LITHIUM CARBONATE 150 MG PO CAPS: 450.0000 mg | ORAL_CAPSULE | Freq: Two times a day (BID) | ORAL | Status: DC

## 2012-04-19 MED ORDER — BUPROPION HCL ER (XL) 300 MG PO TB24: 300.0000 mg | ORAL_TABLET | ORAL | Status: DC

## 2012-04-19 MED ORDER — ADULT MULTIVITAMIN W/MINERALS CH: 1.0000 | ORAL_TABLET | Freq: Every day | ORAL | Status: DC

## 2012-04-19 NOTE — Discharge Summary (Signed)
Physician Discharge Summary Note  Patient:  [name] is an 52 y.o., male MRN:  [id number] DOB:  Jun 02, 1960 Patient phone:  [phone] (home)  Patient address:   [address] [place] [address],   Date of Admission:  03/29/2012 Date of Discharge: 04/19/2012  Reason for Admission:  Bipolar, depressed  Discharge Diagnoses: Principal Problem:  *Bipolar 1 disorder  Review of Systems  Constitutional: Negative.   HENT: Negative.   Eyes: Negative.   Respiratory: Negative.   Cardiovascular: Negative.   Gastrointestinal: Negative.   Genitourinary: Negative.   Musculoskeletal: Negative.   Skin: Negative.   Neurological: Negative.   Endo/Heme/Allergies: Negative.   Psychiatric/Behavioral: Positive for depression.   Axis Diagnosis:   AXIS I:  Bipolar, Depressed AXIS II:  Deferred AXIS III:   Past Medical History  Diagnosis Date  . Bipolar 1 disorder    AXIS IV:  economic problems, other psychosocial or environmental problems, problems related to social environment and problems with primary support group AXIS V:  61-70 mild symptoms  Level of Care:  OP  Hospital Course:   Review of chart, vital signs, medications, and notes. 1-Individual and group therapy attended. 2-Medication management for depression and mood disorder:  Medications reviewed with the patient had no untoward effects 3-Coping skills for depression developed and utilized 4-Crisis stabilization and management 5-Psychosocial education completed to prevent relapse and promote self-care 6-Treatment plan in progress to prevent relapse of depression 7-Patient denied suicidal/homicidal ideations and auditory/visual hallucinations, follow-up appointments encouraged to attend, patient stable, Rx and 14 day supply of medications given.  Consults:  None  Significant Diagnostic Studies:  labs: Completed and reviewed, stable  Discharge Vitals:   Blood pressure 140/103, pulse 80, temperature 97.5 F  (36.4 C), temperature source Oral, resp. rate 16, height 5\' 11"  (1.803 m), weight 72.576 kg (160 lb). Body mass index is 22.32 kg/(m^2). Lab Results:   Results for orders placed during the hospital encounter of 03/29/12 (from the past 72 hour(s))  VITAMIN B12     Status: Normal   Collection Time   04/16/12  8:05 PM      Component Value Range Comment   Vitamin B-12 509  211 - 911 pg/mL   FOLATE RBC     Status: Abnormal   Collection Time   04/16/12  8:05 PM      Component Value Range Comment   RBC Folate 289 (*) >=366 ng/mL Reference range not established for pediatric patients.  LITHIUM LEVEL     Status: Normal   Collection Time   04/17/12  8:10 PM      Component Value Range Comment   Lithium Lvl 0.83  0.80 - 1.40 mEq/L     Physical Findings: AIMS: Facial and Oral Movements Muscles of Facial Expression: None, normal Lips and Perioral Area: None, normal Jaw: None, normal Tongue: None, normal,Extremity Movements Upper (arms, wrists, hands, fingers): None, normal Lower (legs, knees, ankles, toes): None, normal, Trunk Movements Neck, shoulders, hips: None, normal, Overall Severity Severity of abnormal movements (highest score from questions above): None, normal Incapacitation due to abnormal movements: None, normal Patient's awareness of abnormal movements (rate only patient's report): No Awareness, Dental Status Current problems with teeth and/or dentures?: No Does patient usually wear dentures?: No  CIWA:  CIWA-Ar Total: 0  COWS:     Psychiatric Specialty Exam: See Psychiatric Specialty Exam and Suicide Risk Assessment completed by Attending Physician prior to discharge.  Discharge destination:  Home  Is patient on multiple antipsychotic therapies at  discharge:  No   Has Patient had three or more failed trials of antipsychotic monotherapy by history:  No Recommended Plan for Multiple Antipsychotic Therapies:  N/A  Discharge Orders    Future Orders Please Complete By  Expires   Diet - low sodium heart healthy      Activity as tolerated - No restrictions          Medication List     As of 04/19/2012 10:48 AM    STOP taking these medications         QUEtiapine 400 MG 24 hr tablet   Commonly known as: SEROQUEL XR      TAKE these medications      Indication    buPROPion 300 MG 24 hr tablet   Commonly known as: WELLBUTRIN XL   Take 1 tablet (300 mg total) by mouth every morning.    Indication: Depressive Phase of Manic-Depression      lithium carbonate 150 MG capsule   Take 3 capsules (450 mg total) by mouth 2 (two) times daily with a meal.    Indication: Manic-Depression      lurasidone 80 MG Tabs   Commonly known as: LATUDA   Take 1 tablet (80 mg total) by mouth daily after supper.    Indication: Depressive Phase of Manic-Depression      multivitamin with minerals Tabs   Take 1 tablet by mouth daily.    Indication: vitamin and mineral supplement      traZODone 100 MG tablet   Commonly known as: DESYREL   Take 1 tablet (100 mg total) by mouth at bedtime as needed for sleep.    Indication: Trouble Sleeping           Follow-up Information    Follow up with [facility]. On 04/23/2012. (Appointment is with Andres Ege PA for medication management at 2:15 PM)    Contact information:   [address] [place] [address] [phone] Fax [phone]      Follow up with [facility]. (Appointment for counseling will be on ----)    Contact information:   [address] [place] [address] [phone] Fax          Follow-up recommendations:  Activity as tolerated, low-sodium heart healthy diet  Comments:  Patient will [facility]-up with the [facility] for his therapy and Rx  Total Discharge Time:  Greater than 30 minutes  Signed: Nanine Means, PMH-NP 04/19/2012, 10:48 AM

## 2012-04-19 NOTE — Progress Notes (Signed)
[facility] Adult Case Management Discharge Plan :  Will you be returning to the same living situation after discharge: Yes,  home with Mother At discharge, do you have transportation home?:Yes,  both parents will be coming in to go over medication list Do you have the ability to pay for your medications:Yes,    Release of information consent forms completed and in the chart;  Patient's signature needed at discharge.  Patient to Follow up at: Follow-up Information    Follow up with [facility]. On 04/23/2012. (Appointment is with Andres Ege PA for medication management at 2:15 PM)    Contact information:   [address] [place] [address] [phone] Fax [phone]      Follow up with [facility]. (Appointment for counseling will also be at [facility]; CSW waiting to hear date and time at this moment. Will contact family with information if not obtained before discharge)    Contact information:   [address] [place] [address] [phone] Fax          Patient denies SI/HI:   Yes,      Safety Planning and Suicide Prevention discussed:  Yes,    [name] 04/19/2012, 12:55 PM

## 2012-04-19 NOTE — [facility] Suicide Risk Assessment (Signed)
Suicide Risk Assessment  Discharge Assessment     Demographic Factors:  Male, Low socioeconomic status and Unemployed  Mental Status Per Nursing Assessment::   On Admission:  NA  Current Mental Status by Physician: patient denies suicidal ideations, intent or plan.  Loss Factors: Decrease in vocational status and Financial problems/change in socioeconomic status  Historical Factors: NA  Risk Reduction Factors:   Living with another person, especially a relative, Positive social support and Positive therapeutic relationship  Continued Clinical Symptoms:  Resolving depressive sypmtoms  Cognitive Features That Contribute To Risk:  Closed-mindedness    Suicide Risk:  Minimal: No identifiable suicidal ideation.  Patients presenting with no risk factors but with morbid ruminations; may be classified as minimal risk based on the severity of the depressive symptoms  Discharge Diagnoses:   AXIS I:  Bipolar 1 disorder, most recent episode depressed  AXIS II:  Deferred AXIS III:   Past Medical History  Diagnosis Date   AXIS IV:  economic problems and other psychosocial or environmental problems AXIS V:  61-70 mild symptoms  Plan Of Care/Follow-up recommendations:  Activity:  as tolerated Diet:  healthy Tests:  Lithium level Other:  patient to keep his aftercare appointment and takes his medication as recommended.  Is patient on multiple antipsychotic therapies at discharge:  No   Has Patient had three or more failed trials of antipsychotic monotherapy by history:  No  Recommended Plan for Multiple Antipsychotic Therapies: N/A  Bing Duffey,MD 04/19/2012, 8:37 AM

## 2012-04-19 NOTE — Clinical Social Work Note (Signed)
[facility] LCSW Aftercare Discharge Planning Group Note       8:30-9:30 AM  1/3/201411:12 AM  Participation Quality:  Appropriate  Affect:  Appropriate  Cognitive:  Appropriate  Insight:  Engaged  Engagement in Group:  Engaged  Modes of Intervention:  Education, Exploration, Geophysicist/field seismologist of Progress/Problems:  Patient advised of doing well and plans to discharge home today.  He stated he and SW have discharge plans in place but he would need someone to contact his father for transportation home.  He rated depression at three and anxiety at two.  Wynn Banker 04/19/2012 11:12 AM

## 2012-04-19 NOTE — Progress Notes (Addendum)
Nrsg DC note: MD completed DC order and DC SRA in computer per protocol. RN reviewed pt's AVS with pt and his father and step mother and all questions were answered by nursing staff ( to their satisfaction), pt able to state he denies active SI and that he will comply with his DC plan. Pt given  Sample meds per pharmacy, pt completed his self invnetory and on it he wrote he denied SI within the past 24 hrs.   A His affect is poor...flat and he avoids all eye contact. When this nurse speaks with pt and tells him to " take care and make good decisions " he stares in to nurse eyes .   R Safety is in place and  Pt  Is DC'd to home per MD order.

## 2012-04-19 NOTE — Clinical Social Work Note (Signed)
CSW left ROI form with discharging nurse as CSW will be in group when family comes into pick up patient and father will need to sign ROI form as he has Power of Pensions consultant for patient.  Carney Bern, LCSWA Clinical Social Worker [phone]

## 2012-04-20 LAB — VITAMIN D 1,25 DIHYDROXY
Vitamin D2 1, 25 (OH)2: <8 pg/mL
Vitamin D3 1, 25 (OH)2: 41 pg/mL

## 2012-04-23 NOTE — Discharge Summary (Signed)
Seen and agreed. Pratyush Ammon, MD 

## 2012-04-24 NOTE — Progress Notes (Signed)
Patient Discharge Instructions:  After Visit Summary (AVS):   Faxed to:  04/24/12 Psychiatric Admission Assessment Note:   Faxed to:  04/24/12 Suicide Risk Assessment - Discharge Assessment:   Faxed to:  04/24/12 Faxed/Sent to the Next Level Care provider:  04/24/12 Fax to [facility] @ [phone]  Jerelene Redden, 04/24/2012, 2:19 PM

## 2012-05-07 NOTE — Progress Notes (Signed)
Patient ID: QUANTEL MCINTURFF, male   DOB: 09/06/1960, 52 y.o.   MRN: [id number]  I have received a phone call from [facility] parents requesting a letter stating that he is unable to work. I have returned the call and spoken with [facility] father who stated that he does need a letter for Harveer to give to his lawyer regarding his inability to work.  I explained to Mr. Sine that I am unable to write that letter as he is not currently a patient in the hospital. It would need to come from his current outpatient provider.  Mr. Miron stated that his current provider Andres Ege PA at [facility] had refused to do this according to [facility].   I have called and left a message for Ms. Dowless regarding this and asked that she return my call so that we can all be clear on the situation.  Rona Ravens. Lenka Zhao Ms [facility] 05/07/2012 2:08 PM

## 2015-08-22 ENCOUNTER — Encounter (HOSPITAL_COMMUNITY): Payer: Self-pay | Admitting: *Deleted

## 2015-08-22 ENCOUNTER — Ambulatory Visit (HOSPITAL_COMMUNITY)
Admission: EM | Admit: 2015-08-22 | Discharge: 2015-08-22 | Disposition: A | Discharge status: Home / Self Care | Payer: Worker's Compensation | Attending: Family Medicine | Admitting: Family Medicine

## 2015-08-22 DIAGNOSIS — S39012A Strain of muscle, fascia and tendon of lower back, initial encounter: Secondary | ICD-10-CM

## 2015-08-22 MED ORDER — CYCLOBENZAPRINE HCL 5 MG PO TABS: 5.0000 mg | ORAL_TABLET | Freq: Three times a day (TID) | ORAL | Status: DC | PRN

## 2015-08-22 MED ORDER — KETOROLAC TROMETHAMINE 30 MG/ML IJ SOLN
INTRAMUSCULAR | Status: AC
  Filled 2015-08-22: qty 1

## 2015-08-22 MED ORDER — KETOROLAC TROMETHAMINE 30 MG/ML IJ SOLN
30.0000 mg | Freq: Once | INTRAMUSCULAR | Status: AC
  Administered 2015-08-22: 30 mg via INTRAMUSCULAR

## 2015-08-22 MED ORDER — DICLOFENAC POTASSIUM 50 MG PO TABS: 50.0000 mg | ORAL_TABLET | Freq: Three times a day (TID) | ORAL | Status: DC

## 2015-08-22 NOTE — ED Notes (Signed)
Reports lifting a 50# box at the top of a ladder; the box started to fall and pt twisted right to ensure it wouldn't fall.  C/O low central back pain since incident 4 days ago.  Denies any radiation of pain; denies any parasthesias.  Has been resting, using ice, and taking Aleve & IBU without any relief.  Pain became worse 2 days ago.

## 2015-08-22 NOTE — ED Provider Notes (Signed)
CSN: [id number]     Arrival date & time 08/22/15  1553 History   First MD Initiated Contact with Patient 08/22/15 1752     Chief Complaint  Patient presents with  . Back Injury   (Consider location/radiation/quality/duration/timing/severity/associated sxs/prior Treatment) Patient is a 55 y.o. male presenting with back pain. The history is provided by the patient.  Back Pain Location:  Lumbar spine Quality:  Stiffness Radiates to:  Does not radiate Pain severity:  Moderate Onset quality:  Sudden Duration:  4 days Progression:  Unchanged Chronicity:  New Context: lifting heavy objects   Relieved by:  None tried Worsened by:  Nothing tried Ineffective treatments:  None tried Associated symptoms: no abdominal pain, no abdominal swelling, no bladder incontinence, no bowel incontinence, no dysuria, no leg pain, no numbness, no paresthesias, no pelvic pain and no weakness     Past Medical History  Diagnosis Date  . Bipolar 1 disorder (HCC)    History reviewed. No pertinent past surgical history. No family history on file. Social History  Substance Use Topics  . Smoking status: Never Smoker   . Smokeless tobacco: Never Used  . Alcohol Use: No     Comment: occasionally    Review of Systems  Constitutional: Negative.   Gastrointestinal: Negative.  Negative for abdominal pain and bowel incontinence.  Genitourinary: Negative for bladder incontinence, dysuria and pelvic pain.  Musculoskeletal: Positive for back pain. Negative for joint swelling and gait problem.  Neurological: Negative for weakness, numbness and paresthesias.  All other systems reviewed and are negative.   Allergies  Medrol  Home Medications   Prior to Admission medications   Medication Sig Start Date End Date Taking? Authorizing Provider  lithium carbonate 150 MG capsule Take 3 capsules (450 mg total) by mouth 2 (two) times daily with a meal. 04/19/12  Yes Charm Rings, NP  QUEtiapine (SEROQUEL) 100 MG  tablet Take 100 mg by mouth at bedtime.   Yes Historical Provider, MD  traZODone (DESYREL) 100 MG tablet Take 1 tablet (100 mg total) by mouth at bedtime as needed for sleep. 04/19/12  Yes Charm Rings, NP  buPROPion (WELLBUTRIN XL) 300 MG 24 hr tablet Take 1 tablet (300 mg total) by mouth every morning. 04/19/12   Charm Rings, NP  cyclobenzaprine (FLEXERIL) 5 MG tablet Take 1 tablet (5 mg total) by mouth 3 (three) times daily as needed for muscle spasms. 08/22/15   Linna Hoff, MD  diclofenac (CATAFLAM) 50 MG tablet Take 1 tablet (50 mg total) by mouth 3 (three) times daily. For back pain 08/22/15   Linna Hoff, MD  lurasidone (LATUDA) 80 MG TABS Take 1 tablet (80 mg total) by mouth daily after supper. 04/19/12   Charm Rings, NP  Multiple Vitamin (MULTIVITAMIN WITH MINERALS) TABS Take 1 tablet by mouth daily. 04/19/12   Charm Rings, NP   Meds Ordered and Administered this Visit   Medications  ketorolac (TORADOL) 30 MG/ML injection 30 mg (not administered)    BP 174/79 mmHg  Pulse 58  Temp(Src) 98 F (36.7 C) (Oral)  Resp 18  SpO2 99% No data found.   Physical Exam  Constitutional: He is oriented to person, place, and time. He appears well-developed and well-nourished. No distress.  Musculoskeletal: He exhibits tenderness.       Lumbar back: He exhibits decreased range of motion, tenderness, pain and spasm. He exhibits no swelling and normal pulse.       Back:  Neurological:  He is alert and oriented to person, place, and time.  Skin: Skin is warm and dry.  Nursing note and vitals reviewed.   ED Course  Procedures (including critical care time)  Labs Review Labs Reviewed - No data to display  Imaging Review No results found.   Visual Acuity Review  Right Eye Distance:   Left Eye Distance:   Bilateral Distance:    Right Eye Near:   Left Eye Near:    Bilateral Near:         MDM   1. Low back strain, initial encounter        Linna HoffJames D Rudransh Bellanca, MD 08/22/15  [phone]

## 2015-12-29 ENCOUNTER — Ambulatory Visit (HOSPITAL_COMMUNITY)
Admission: RE | Admit: 2015-12-29 | Discharge: 2015-12-29 | Disposition: A | Discharge status: Home / Self Care | Payer: BLUE CROSS/BLUE SHIELD | Source: Home / Self Care | Attending: Psychiatry | Admitting: Psychiatry

## 2015-12-29 ENCOUNTER — Emergency Department (HOSPITAL_COMMUNITY)
Admission: EM | Admit: 2015-12-29 | Discharge: 2015-12-30 | Disposition: A | Discharge status: Other Acute Inpatient Hospital | Payer: BLUE CROSS/BLUE SHIELD | Attending: Emergency Medicine | Admitting: Emergency Medicine

## 2015-12-29 ENCOUNTER — Encounter (HOSPITAL_COMMUNITY): Payer: Self-pay

## 2015-12-29 DIAGNOSIS — Z79899 Other long term (current) drug therapy: Secondary | ICD-10-CM | POA: Insufficient documentation

## 2015-12-29 DIAGNOSIS — F319 Bipolar disorder, unspecified: Secondary | ICD-10-CM | POA: Diagnosis not present

## 2015-12-29 DIAGNOSIS — Z0000 Encounter for general adult medical examination without abnormal findings: Secondary | ICD-10-CM | POA: Diagnosis present

## 2015-12-29 NOTE — BH Assessment (Addendum)
Tele Assessment Note   [name] is an 55 y.o. male. Presenting voluntarily for assessment. Pt was referred by a friend who is present during this assessment. [facility] to observation of irrational and impulsive behaviors that are not typical of pt. Per friend, these behaviors have been increasing in severity and frequency x 1 mth. Pt has history of bipolar disorder and is exhibiting signs of mania.  Pt presents with flight of ideas with rapid and pressured speech, limiting information gathering process. Pt's Friend reports suspicion of heavy cocaine use. Friend is unsure of any additional substances pt may use.  Pt reports daily use of alcohol and history of cocaine use. Pt denies regular cocaine use and reports last use to be approximately three weeks ago. Pt denies use of any additional substances.  Pt verbally acknowledges current mania. Pt states he has not slept in one week, has been experiencing increased anxiety, and anger, and has been making poor and impulsive decisions. Pt and friend report poor appetite and weight loss. Pt reports turmoil with fianc due to suspicion of substance use and to pt taking out a loan for $1500. Pt states loan was to assist with son's school expenses (clothing etc...).Pt states he currently resides with his fianc however, is living out of his vehicle by choice. Pt denies suicidal and homicidal ideations.  Pt denies any history of aggression and self-injurious behaviors. Pt providing contradictory responses regarding the presence of and any history of hallucinations. Pt reports history of multiple inpatient admissions for bipolar disorder and states he [facility] one [facility] for same complaints.   [facility] and reports concerns with medication effectiveness. Friend reports concerns over witnessing multiple bottles of the same prescriptions filled on the same  day but, with various dosages.   Diagnosis: F31.13 Bipolar I disorder, manic episode  Past Medical History:  Past Medical History:  Diagnosis Date  . Bipolar 1 disorder (HCC)     No past surgical history on file.  Family History: No family history on file.  Social History:  reports that he has never smoked. He has never used smokeless tobacco. He reports that he does not drink alcohol or use drugs.  Additional Social History:  Alcohol / Drug Use Pain Medications: Pt denies abuse Prescriptions: Pt denies abuse Over the Counter: Pt denies abuse History of alcohol / drug use?: Yes Longest period of sobriety (when/how long): Not Reported Negative Consequences of Use: Personal relationships Withdrawal Symptoms:  (None endorsed) Substance #1 Name of Substance 1: Alcohol 1 - Age of First Use: "Since I been born" 1 - Amount (size/oz): Not Reported due to pt mania and flight of ideas 1 - Frequency: daily 1 - Duration: Ongoing 1 - Last Use / Amount: PTA/ "a c ouple beers" Substance #2 Name of Substance 2: Cocaine 2 - Age of First Use: Not Reported 2 - Amount (size/oz): Not Reported 2 - Frequency: Not Reported- pt denies regular use 2 - Duration: onging  2 - Last Use / Amount: approx. 3 wks ago/ "20 bag"  CIWA:   COWS:    PATIENT STRENGTHS: (choose at least two) Average or above average intelligence Supportive family/friends  Allergies:  Allergies  Allergen Reactions  . Medrol [Methylprednisolone] Rash    Home Medications:  (Not in a hospital admission)  OB/GYN Status:  No LMP for male patient.  General Assessment Data [facility]  Services TTS Assessment: In system Is this a Tele or Face-to-Face Assessment?: Face-to-Face Is this an Initial Assessment or a Re-assessment for this encounter?: Initial Assessment Marital status: Widowed Is patient pregnant?: No Pregnancy Status: No Living Arrangements: Spouse/significant other (resides w/  fiance but, currently living in vehicle alone) Can pt return to current living arrangement?: Yes Admission Status: Voluntary Is patient capable of signing voluntary admission?: Yes Referral Source: Self/Family/Friend Insurance type: Scientist, research (physical sciences) Exam [facility] Walk-in ONLY) Medical Exam completed: Yes  Crisis Care Plan Living Arrangements: Spouse/significant other (resides w/ fiance but, currently living in vehicle alone) Name of Psychiatrist: Transport planner Name of Therapist: None  Education Status Is patient currently in school?: No Highest grade of school patient has completed: Some College  Risk to self with the past 6 months Suicidal Ideation: No Has patient been a risk to self within the past 6 months prior to admission? : No Suicidal Intent: No Has patient had any suicidal intent within the past 6 months prior to admission? : No Is patient at risk for suicide?: No Suicidal Plan?: No Has patient had any suicidal plan within the past 6 months prior to admission? : No Access to Means: No What has been your use of drugs/alcohol within the last 12 months?: Pt reports use of alcohol and cocaine Previous Attempts/Gestures: No Intentional Self Injurious Behavior: None Family Suicide History: Yes (wife 10/2002) Recent stressful life event(s): Turmoil (Comment) (turmoil w/fiance regarding SA suspicion and manic behaviors) Persecutory voices/beliefs?: No Depression: No Depression Symptoms: Insomnia, Feeling angry/irritable (attributed to mania sxs) Substance abuse history and/or treatment for substance abuse?: Yes Suicide prevention information given to non-admitted patients: Not applicable  Risk to Others within the past 6 months Homicidal Ideation: No Does patient have any lifetime risk of violence toward others beyond the six months prior to admission? : No Thoughts of Harm to Others: No Current Homicidal Intent: No Current Homicidal Plan: No Access to Homicidal Means:  No History of harm to others?: No Assessment of Violence: None Noted Does patient have access to weapons?: No Criminal Charges Pending?: No Does patient have a court date: No Is patient on probation?: No  Psychosis Hallucinations:  (pt provided contradictory ans. regarding AH/VH/OH/TH) Delusions: None noted  Mental Status Report Appearance/Hygiene: Unremarkable Eye Contact: Fair Motor Activity: Hyperactivity (fidgeting) Speech: Rapid, Pressured Level of Consciousness: Alert Mood: Anxious Affect: Other (Comment) (Mood Congruent) Anxiety Level: Moderate Thought Processes: Flight of Ideas Judgement: Impaired Orientation: Person, Place, Time, Situation Obsessive Compulsive Thoughts/Behaviors: Minimal  Cognitive Functioning Concentration: Decreased Memory: Recent Intact, Remote Intact IQ: Average Insight: Fair Impulse Control: Poor Appetite: Poor Weight Loss:  (pt & friend report unkn. amt. of recent weight loss) Weight Gain: 0 Sleep: Decreased Total Hours of Sleep: 0 Vegetative Symptoms: Unable to Assess (UTA due to mania & flight of ideas)  ADLScreening [facility] Assessment Services) Patient's cognitive ability adequate to safely complete daily activities?: Yes Patient able to express need for assistance with ADLs?: Yes Independently performs ADLs?: Yes (appropriate for developmental age)  Prior Inpatient Therapy Prior Inpatient Therapy: Yes Prior Therapy Dates: Multiple Prior Therapy Facilty/Provider(s): [facility] Reason for Treatment: Bipolar d/o  Prior Outpatient Therapy Prior Outpatient Therapy: Yes Prior Therapy Dates: ongoing-medication management only Prior Therapy Facilty/Provider(s): [facility] Reason for Treatment: Medication Mangement Does patient have an ACCT team?: No Does patient have Intensive In-House Services?  : No Does patient have [facility] services? : Yes Does patient have P4CC services?: No  ADL Screening (condition at  time of  admission) Patient's cognitive ability adequate to safely complete daily activities?: Yes Is the patient deaf or have difficulty hearing?: No Does the patient have difficulty seeing, even when wearing glasses/contacts?: No Does the patient have difficulty concentrating, remembering, or making decisions?: Yes Patient able to express need for assistance with ADLs?: Yes Does the patient have difficulty dressing or bathing?: No Independently performs ADLs?: Yes (appropriate for developmental age) Does the patient have difficulty walking or climbing stairs?: No Weakness of Legs: None Weakness of Arms/Hands: None  Home Assistive Devices/Equipment Home Assistive Devices/Equipment: None  Therapy Consults (therapy consults require a physician order) PT Evaluation Needed: No OT Evalulation Needed: No SLP Evaluation Needed: No Abuse/Neglect Assessment (Assessment to be complete while patient is alone) Physical Abuse: Denies Verbal Abuse: Denies Sexual Abuse: Denies Exploitation of patient/patient's resources: Denies Self-Neglect: Denies Values / Beliefs Cultural Requests During Hospitalization: None Spiritual Requests During Hospitalization: None Consults Spiritual Care Consult Needed: No Social Work Consult Needed: No Merchant navy officer (For Healthcare) Does patient have an advance directive?: No Would patient like information on creating an advanced directive?: No - patient declined information    Additional Information 1:1 In Past 12 Months?: No CIRT Risk: No Elopement Risk: No Does patient have medical clearance?: No     Disposition: Per Donell Sievert, PA pt meets criteria for inpatient admission. Per PA pt is to be transported to [facility] for medical clearance. Pt has been assigned to [facility] 502 bed 2 and may arrive 9.14.17 AM. Aurther Loft, Charge RN informed of pt disposition.  Disposition Initial Assessment Completed for this Encounter: Yes Disposition of Patient: Inpatient treatment  program Type of inpatient treatment program: Adult  [name] 12/29/2015 10:20 PM

## 2015-12-29 NOTE — ED Triage Notes (Signed)
[facility] to bed [facility]2 at 9am tomorrow.  He reports being very manic and spending lots of money on drugs, he chooses to live out of his car right now.  Pt denies SI/HI

## 2015-12-29 NOTE — H&P (Signed)
Behavioral Health Medical Screening Exam  [name] is an 55 y.o. male.  Total Time spent with patient: 30 minutes  Psychiatric Specialty Exam: Physical Exam  Nursing note and vitals reviewed. Constitutional: He is oriented to person, place, and time. He appears well-developed and well-nourished. No distress.  HENT:  Head: Normocephalic.  Eyes: Pupils are equal, round, and reactive to light.  Neurological: He is alert and oriented to person, place, and time. No cranial nerve deficit.  Skin: Skin is warm and dry. He is not diaphoretic.  Psychiatric: Thought content normal. His mood appears anxious. His speech is rapid and/or pressured. He is agitated. Cognition and memory are normal. He expresses impulsivity.    Review of Systems  Psychiatric/Behavioral: Positive for substance abuse. The patient is nervous/anxious and has insomnia.   All other systems reviewed and are negative.   There were no vitals taken for this visit.There is no height or weight on file to calculate BMI.  General Appearance: Disheveled  Eye Contact:  Minimal  Speech:  Pressured  Volume:  Increased  Mood:  Anxious  Affect:  Full Range  Thought Process:  Disorganized  Orientation:  Full (Time, Place, and Person)  Thought Content:  Flight of ideas  Suicidal Thoughts:  No  Homicidal Thoughts:  No  Memory:  Immediate;   Fair  Judgement:  Impaired  Insight:  Lacking  Psychomotor Activity:  Increased  Concentration: Concentration: Poor  Recall:  FiservFair  Fund of Knowledge:Fair  Language: Good  Akathisia:  Negative  Handed:  Right  AIMS (if indicated):     Assets:  Social Support  Sleep:       Musculoskeletal: Strength & Muscle Tone: within normal limits Gait & Station: normal Patient leans: N/A  There were no vitals taken for this visit.  Recommendations:  Based on my evaluation the patient appears to have an emergency medical condition for which I recommend the patient be transferred to the  emergency department for further evaluation. The patient reportedly has been doing illicit drugs ( Cocaine). He is meeting IP criteria for admission due to substance Induced Mood Disorder and Bipolar 1 Disorder (Manic) [facility], with plans for disposition to follow.  Amirra Herling E, PA-C 12/29/2015, 10:07 PM

## 2015-12-29 NOTE — ED Notes (Signed)
Bed: WTR5 Expected date:  Expected time:  Means of arrival:  Comments: 

## 2015-12-30 ENCOUNTER — Inpatient Hospital Stay (HOSPITAL_COMMUNITY)
Admission: EM | Admit: 2015-12-30 | Discharge: 2016-01-04 | DRG: 885 | Disposition: A | Discharge status: Home / Self Care | Payer: Federal, State, Local not specified - Other | Source: Intra-hospital | Attending: Psychiatry | Admitting: Psychiatry

## 2015-12-30 DIAGNOSIS — G4700 Insomnia, unspecified: Secondary | ICD-10-CM | POA: Diagnosis present

## 2015-12-30 DIAGNOSIS — Z811 Family history of alcohol abuse and dependence: Secondary | ICD-10-CM

## 2015-12-30 DIAGNOSIS — F312 Bipolar disorder, current episode manic severe with psychotic features: Principal | ICD-10-CM | POA: Diagnosis present

## 2015-12-30 DIAGNOSIS — F319 Bipolar disorder, unspecified: Secondary | ICD-10-CM | POA: Diagnosis present

## 2015-12-30 DIAGNOSIS — Z9114 Patient's other noncompliance with medication regimen: Secondary | ICD-10-CM

## 2015-12-30 DIAGNOSIS — F1410 Cocaine abuse, uncomplicated: Secondary | ICD-10-CM | POA: Clinically undetermined

## 2015-12-30 DIAGNOSIS — F411 Generalized anxiety disorder: Secondary | ICD-10-CM | POA: Diagnosis present

## 2015-12-30 LAB — TSH: TSH: 0.984 u[IU]/mL (ref 0.350–4.500)

## 2015-12-30 LAB — COMPREHENSIVE METABOLIC PANEL
ALT: 32 U/L (ref 17–63)
AST: 33 U/L (ref 15–41)
Albumin: 4.2 g/dL (ref 3.5–5.0)
Alkaline Phosphatase: 86 U/L (ref 38–126)
Anion gap: 11 (ref 5–15)
BILIRUBIN TOTAL: 0.7 mg/dL (ref 0.3–1.2)
BUN: 13 mg/dL (ref 6–20)
CHLORIDE: 105 mmol/L (ref 101–111)
CO2: 21 mmol/L — ABNORMAL LOW (ref 22–32)
CREATININE: 1.11 mg/dL (ref 0.61–1.24)
Calcium: 9.5 mg/dL (ref 8.9–10.3)
Glucose, Bld: 113 mg/dL — ABNORMAL HIGH (ref 65–99)
Potassium: 3.2 mmol/L — ABNORMAL LOW (ref 3.5–5.1)
Sodium: 137 mmol/L (ref 135–145)
TOTAL PROTEIN: 7.7 g/dL (ref 6.5–8.1)

## 2015-12-30 LAB — CBC WITH DIFFERENTIAL/PLATELET
BASOS ABS: 0 10*3/uL (ref 0.0–0.1)
BASOS PCT: 0 %
EOS ABS: 0.2 10*3/uL (ref 0.0–0.7)
EOS PCT: 1 %
HCT: 39.1 % (ref 39.0–52.0)
Hemoglobin: 12.9 g/dL — ABNORMAL LOW (ref 13.0–17.0)
LYMPHS PCT: 18 %
Lymphs Abs: 2.4 10*3/uL (ref 0.7–4.0)
MCH: 30.4 pg (ref 26.0–34.0)
MCHC: 33 g/dL (ref 30.0–36.0)
MCV: 92 fL (ref 78.0–100.0)
MONO ABS: 0.8 10*3/uL (ref 0.1–1.0)
Monocytes Relative: 6 %
Neutro Abs: 10.2 10*3/uL — ABNORMAL HIGH (ref 1.7–7.7)
Neutrophils Relative %: 75 %
PLATELETS: 336 10*3/uL (ref 150–400)
RBC: 4.25 MIL/uL (ref 4.22–5.81)
RDW: 12.9 % (ref 11.5–15.5)
WBC: 13.7 10*3/uL — AB (ref 4.0–10.5)

## 2015-12-30 LAB — LITHIUM LEVEL: LITHIUM LVL: 0.06 mmol/L — AB (ref 0.60–1.20)

## 2015-12-30 LAB — RAPID URINE DRUG SCREEN, HOSP PERFORMED
AMPHETAMINES: NOT DETECTED
BENZODIAZEPINES: POSITIVE — AB
Barbiturates: NOT DETECTED
COCAINE: NOT DETECTED
OPIATES: NOT DETECTED
Tetrahydrocannabinol: NOT DETECTED

## 2015-12-30 LAB — LIPID PANEL
CHOL/HDL RATIO: 4.8 ratio
CHOLESTEROL: 256 mg/dL — AB (ref 0–200)
HDL: 53 mg/dL (ref >40)
LDL Cholesterol: 149 mg/dL — ABNORMAL HIGH (ref 0–99)
Triglycerides: 272 mg/dL — ABNORMAL HIGH (ref <150)
VLDL: 54 mg/dL — AB (ref 0–40)

## 2015-12-30 LAB — ETHANOL: ALCOHOL ETHYL (B): 9 mg/dL — AB (ref <5)

## 2015-12-30 MED ORDER — ALUM & MAG HYDROXIDE-SIMETH 200-200-20 MG/5ML PO SUSP: 30.0000 mL | ORAL | Status: DC | PRN

## 2015-12-30 MED ORDER — QUETIAPINE FUMARATE 200 MG PO TABS
200.0000 mg | ORAL_TABLET | Freq: Every day | ORAL | Status: DC
  Administered 2015-12-30: 200 mg via ORAL
  Filled 2015-12-30 (×3): qty 1

## 2015-12-30 MED ORDER — ACETAMINOPHEN 325 MG PO TABS: 650.0000 mg | ORAL_TABLET | Freq: Four times a day (QID) | ORAL | Status: DC | PRN

## 2015-12-30 MED ORDER — POTASSIUM CHLORIDE CRYS ER 20 MEQ PO TBCR
40.0000 meq | EXTENDED_RELEASE_TABLET | Freq: Once | ORAL | Status: AC
  Administered 2015-12-30: 40 meq via ORAL
  Filled 2015-12-30: qty 2

## 2015-12-30 MED ORDER — LITHIUM CARBONATE ER 450 MG PO TBCR
450.0000 mg | EXTENDED_RELEASE_TABLET | Freq: Two times a day (BID) | ORAL | Status: DC
  Administered 2015-12-30 – 2016-01-03 (×9): 450 mg via ORAL
  Filled 2015-12-30 (×13): qty 1

## 2015-12-30 MED ORDER — OLANZAPINE 10 MG PO TBDP: 10.0000 mg | ORAL_TABLET | Freq: Three times a day (TID) | ORAL | Status: DC | PRN

## 2015-12-30 MED ORDER — MAGNESIUM HYDROXIDE 400 MG/5ML PO SUSP: 30.0000 mL | Freq: Every day | ORAL | Status: DC | PRN

## 2015-12-30 MED ORDER — LORAZEPAM 1 MG PO TABS: 1.0000 mg | ORAL_TABLET | Freq: Four times a day (QID) | ORAL | Status: DC | PRN

## 2015-12-30 MED ORDER — OLANZAPINE 10 MG IM SOLR: 10.0000 mg | Freq: Three times a day (TID) | INTRAMUSCULAR | Status: DC | PRN

## 2015-12-30 MED ORDER — ENSURE ENLIVE PO LIQD
237.0000 mL | Freq: Two times a day (BID) | ORAL | Status: DC
  Administered 2015-12-30 – 2016-01-04 (×10): 237 mL via ORAL

## 2015-12-30 NOTE — ED Notes (Signed)
Patient denies SI, HI and AVH at this time. Plan of care discussed with patient. Patient verbalized no complaints at this time. Encouragement and support provided and safety maintain. Q 15 min safety checks in place and video monitoring.

## 2015-12-30 NOTE — Progress Notes (Signed)
Patient ID: [name], male   DOB: 1961/02/03, 55 y.o.   MRN: [id number] D: Client visible on the unit, seen in dayroom watching TV and on the phone. Client reports goal "get back on medication" "been back and forth between my GF and my mom house and I got off track with my medicine. A: Writer provided emotional support, reviewed medications, administered as ordered. Staff will monitor q4615min for safety. R: Client is safe on the unit, attended group. Client attended karaoke and sang a couple of songs "I enjoyed it"

## 2015-12-30 NOTE — ED Notes (Signed)
[facility] Expected date:  Expected time:  Means of arrival:  Comments: Hold for room 12

## 2015-12-30 NOTE — Progress Notes (Signed)
Admission Note: Patient is a 55 year old male [facility] with reports of irrational and impulsive behaviors.  Patient is alert and oriented x 4.  Patient presents with rapid pressured speech during assessment.  Patient denies SI, auditory and visual hallucinations.  Patient states he has not slept for a week.  Patient reports his work environment as a stressor and he is afraid of going back to work.  Patient states he is here to get the right medication for his illness.  Admission packet reviewed with patient and consent for treatment signed.  Skin assessment completed and personal belonging searched for contraband.  Patient oriented to the unit, staff and room.  Routine safety checks initiated.  Patient offered support and encouragement.  Patient safe on the unit.

## 2015-12-30 NOTE — B[facility] Counselor (Signed)
Adult Comprehensive Assessment  Patient ID: Artist PaisMark E Feltner, male   DOB: [birth year], 55 y.o.   MRN: [id number]  Information Source: Information source: Patient  Information Source:    Current Stressors:  Educational / Learning stressors:  Employment / Job issues:  Family Relationships:  Surveyor, quantityinancial / Lack of resources (include bankruptcy): "Financially tight" Housing / Lack of housing: Patient reported moving from house to house between his fiancee', his mother and father.  Physical health (include injuries & life threatening diseases): N/A Social relationships: Fiance Substance abuse: Patient reported drinking a few beers daily. Patient also reported to smoking cocaine once a month since previous admission.   Bereavement / Loss: Lost wife in 2005  Living/Environment/Situation:  Living Arrangements: Patient reported living with his current fiancee'.  Living conditions (as described by patient or guardian):Patient stated that the living conditions at his fiancee's house is "wonderful".  How long has patient lived in current situation?: 2 years What is atmosphere in current home: Comfortable;Supportive  Family History:  Marital status: Widowed Widowed, when?: 2005 Does patient have children?: Yes How many children?: 2  How is patient's relationship with their children?: 2 boys - stay with maternal grandparents - very good relationship with his sons.  Childhood History:  By whom was/is the patient raised?: Both parents Additional childhood history information: "Very good" Description of patient's relationship with caregiver when they were a child: Parents got divorced when pt was 55 years old Patient's description of current relationship with people who raised him/her: Strained relationship with mother and good relationship with father Does patient have siblings?: Yes Number of Siblings: 1  Description of patient's current relationship with siblings: brother - good relationship  with him Did patient suffer any verbal/emotional/physical/sexual abuse as a child?: No Did patient suffer from severe childhood neglect?: No Has patient ever been sexually abused/assaulted/raped as an adolescent or adult?: No Was the patient ever a victim of a crime or a disaster?: No Witnessed domestic violence?: No Has patient been effected by domestic violence as an adult?: No  Education:  Highest grade of school patient has completed: Graduated high school, 2 years at [employer], 2 years at [employer] Currently a Consulting civil engineerstudent?: No Learning disability?: No  Employment/Work Situation:   Employment situation: Employed 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 How long has patient been employed?: 5-6 years Patient's job has been impacted by current illness: No What is the longest time patient has a held a job?: 13 years Where was the patient employed at that time?: [employer] Has patient ever been in the Eli Lilly and Companymilitary?: No Has patient ever served in combat?: No  Financial Resources:   Financial resources: Income from employment; Media plannerrivate insurance  Does patient have a representative payee or guardian?: No  Alcohol/Substance Abuse:   What has been your use of drugs/alcohol within the last 12 months?: Patient stated that he drinks "a few beers" on a daily basis and also smokes cocaine at least once a month for "energy".  If attempted suicide, did drugs/alcohol play a role in this?: No Alcohol/Substance Abuse Treatment Hx: Yes If yes, describe treatment: [facility] & [facility] [place], [facility] [place]. 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 with golf and tennis In what areas does patient struggle / problems for patient: Struggling with finances  Discharge Plan:   Does patient have access to transportation?: Yes Will patient be returning to same living situation after discharge?: Yes Currently receiving community mental health services: Yes (From Whom) Vesta Mixer) If no, would patient like referral for services when discharged?: Yes (What county?) [facility] - refer back to [place]) Does patient have financial barriers related to discharge medications?: No  Summary/Recommendations:   Summary and Recommendations (to be completed by the evaluator): Garin is a 55 year old, Caucasian male who is diagnosed with Bipolar 1 disorder. Temitope presented to the hospital voluntarily with friend due to his irrational and impulsive behaviors. During the assessment Gus stated that he was aware that he was having a manic episode. Patient stated that he currently lives with his fiancee' here in [place] and plans to return home and follow up outpatient and also requested a referral to a therapist. [place] can benefit from crisis stabilization, medication management, therapeutic milieu, and referral services.   Baldo Daub. 12/30/2015

## 2015-12-30 NOTE — ED Provider Notes (Signed)
[facility]-EMERGENCY DEPT Provider Note   CSN: [id number] Arrival date & time: 12/29/15  2311  By signing my name below, I, [name], attest that this documentation has been prepared under the direction and in the presence TRW AutomotiveKelly Gloyd Happ, PA-C. Electronically Signed: [name], Scribe. 12/30/2015. 12:09 AM.  History   Chief Complaint Chief Complaint  Patient presents with  . Manic Behavior    The history is provided by the patient. No language interpreter was used.     HPI Comments: [name] is a 55 y.o. male with PMHx of bipolar 1 disorder who presents to the Emergency Department complaining of increased manic behavior over the last several weeks. Pt is here for medical clearance and [facility] Pt states he has been spending money he does not have. [facility] but has been having manic issues since then as well, so he decided to seek help again. He denies SI, HI, or any other associated symptoms.   Past Medical History:  Diagnosis Date  . Bipolar 1 disorder [facility](HCC)     Patient Active Problem List   Diagnosis Date Noted  . Bipolar 1 disorder (HCC) 04/01/2012    History reviewed. No pertinent surgical history.   Home Medications    Prior to Admission medications   Medication Sig Start Date End Date Taking? Authorizing Provider  lithium carbonate (ESKALITH) 450 MG CR tablet Take 450 mg by mouth every 12 (twelve) hours.   Yes Historical Provider, MD  lithium carbonate 300 MG capsule Take 600 mg by mouth at bedtime.   Yes Historical Provider, MD  QUEtiapine (SEROQUEL) 100 MG tablet Take 100 mg by mouth 2 (two) times daily.    Yes Historical Provider, MD  QUEtiapine (SEROQUEL) 200 MG tablet Take 200 mg by mouth at bedtime.   Yes Historical Provider, MD  temazepam (RESTORIL) 30 MG capsule Take 30 mg by mouth at bedtime as needed for sleep.   Yes Historical Provider, MD  traZODone  (DESYREL) 100 MG tablet Take 1 tablet (100 mg total) by mouth at bedtime as needed for sleep. 04/19/12  Yes Charm RingsJamison Y Lord, NP  buPROPion (WELLBUTRIN XL) 300 MG 24 hr tablet Take 1 tablet (300 mg total) by mouth every morning. Patient not taking: Reported on 12/29/2015 04/19/12   Charm RingsJamison Y Lord, NP  cyclobenzaprine (FLEXERIL) 5 MG tablet Take 1 tablet (5 mg total) by mouth 3 (three) times daily as needed for muscle spasms. Patient not taking: Reported on 12/29/2015 08/22/15   Marc HoffJames D Kindl, MD  diclofenac (CATAFLAM) 50 MG tablet Take 1 tablet (50 mg total) by mouth 3 (three) times daily. For back pain Patient not taking: Reported on 12/29/2015 08/22/15   Marc HoffJames D Kindl, MD  lithium carbonate 150 MG capsule Take 3 capsules (450 mg total) by mouth 2 (two) times daily with a meal. Patient not taking: Reported on 12/29/2015 04/19/12   Charm RingsJamison Y Lord, NP  lurasidone (LATUDA) 80 MG TABS Take 1 tablet (80 mg total) by mouth daily after supper. Patient not taking: Reported on 12/29/2015 04/19/12   Charm RingsJamison Y Lord, NP  Multiple Vitamin (MULTIVITAMIN WITH MINERALS) TABS Take 1 tablet by mouth daily. Patient not taking: Reported on 12/29/2015 04/19/12   Charm RingsJamison Y Lord, NP    Family History History reviewed. No pertinent family history.  Social History Social History  Substance Use Topics  . Smoking status: Never Smoker  . Smokeless tobacco:  Never Used  . Alcohol use No     Comment: occasionally     Allergies   Medrol [methylprednisolone]   Review of Systems Review of Systems A complete 10 system review of systems was obtained and all systems are negative except as noted in the HPI and PMH.    Physical Exam Updated Vital Signs BP 163/89   Pulse 74   Temp 98.7 F (37.1 C) (Oral)   Resp 16   SpO2 100%   Physical Exam  Constitutional: He is oriented to person, place, and time. He appears well-developed and well-nourished. No distress.  HENT:  Head: Normocephalic and atraumatic.  Eyes: Conjunctivae and  EOM are normal. No scleral icterus.  Neck: Normal range of motion.  Pulmonary/Chest: Effort normal. No respiratory distress.  Musculoskeletal: Normal range of motion.  Neurological: He is alert and oriented to person, place, and time.  Skin: Skin is warm and dry. No rash noted. He is not diaphoretic. No erythema. No pallor.  Psychiatric: He has a normal mood and affect. His speech is rapid and/or pressured (mild). He is hyperactive (mild). He expresses no homicidal and no suicidal ideation.  Nursing note and vitals reviewed.    ED Treatments / Results  Labs (all labs ordered are listed, but only abnormal results are displayed) Labs Reviewed  COMPREHENSIVE METABOLIC PANEL - Abnormal; Notable for the following:       Result Value   Potassium 3.2 (*)    CO2 21 (*)    Glucose, Bld 113 (*)    All other components within normal limits  ETHANOL - Abnormal; Notable for the following:    Alcohol, Ethyl (B) 9 (*)    All other components within normal limits  CBC WITH DIFFERENTIAL/PLATELET - Abnormal; Notable for the following:    WBC 13.7 (*)    Hemoglobin 12.9 (*)    Neutro Abs 10.2 (*)    All other components within normal limits  URINE RAPID DRUG SCREEN, HOSP PERFORMED - Abnormal; Notable for the following:    Benzodiazepines POSITIVE (*)    All other components within normal limits    EKG  EKG Interpretation None       Radiology No results found.  Procedures Procedures (including critical care time)  DIAGNOSTIC STUDIES: Oxygen Saturation is 97% on RA, normal by my interpretation.    COORDINATION OF CARE: 12:09 AM Discussed treatment plan with pt at bedside and pt agreed to plan.  Medications Ordered in ED Medications - No data to display   Initial Impression / Assessment and Plan / ED Course  I have reviewed the triage vital signs and the nursing notes.  Pertinent labs & imaging results that were available during my care of the patient were reviewed by me and  considered in my medical decision making (see chart for details).  Clinical Course    Patient medically cleared. He has been accepted to [facility] for transfer after 9AM. Disposition to be set by oncoming ED provider.   Final Clinical Impressions(s) / ED Diagnoses   Final diagnoses:  Bipolar 1 disorder [facility])    New Prescriptions New Prescriptions   No medications on file    Vitals:   12/29/15 2326 12/30/15 0121  BP: 166/96 163/89  Pulse: 62 74  Resp: 20 16  Temp: 98.7 F (37.1 C)   TempSrc: Oral   SpO2: 97% 100%      Antony Madura, PA-C 12/30/15 0602    Derwood Kaplan, MD 12/30/15 [id number]

## 2015-12-30 NOTE — Tx Team (Signed)
Initial Treatment Plan 12/30/2015 1:37 PM [name] [id number]    PATIENT STRESSORS: Financial difficulties Medication change or noncompliance Occupational concerns Substance abuse   PATIENT STRENGTHS: Ability for insight Average or above average intelligence Capable of independent living Communication skills Motivation for treatment/growth   PATIENT IDENTIFIED PROBLEMS: "my health"  "The right medication"  Anxiety  Medication Noncompliance               DISCHARGE CRITERIA:  Ability to meet basic life and health needs Adequate post-discharge living arrangements Motivation to continue treatment in a less acute level of care Verbal commitment to aftercare and medication compliance  PRELIMINARY DISCHARGE PLAN: Attend aftercare/continuing care group Outpatient therapy Return to previous living arrangement  PATIENT/FAMILY INVOLVEMENT: This treatment plan has been presented to and reviewed with the patient, [name].  The patient and family have been given the opportunity to ask questions and make suggestions.  Mickie BailElizabeth O Iwenekha, RN 12/30/2015, 1:37 PM

## 2015-12-30 NOTE — [facility] Group Notes (Signed)
Type of Therapy: Process Group Therapy  Participation Level:  Active  Participation Quality:  Appropriate  Affect:  Flat  Cognitive:  Oriented  Insight:  Improving  Engagement in Group:  Limited  Engagement in Therapy:  Limited  Modes of Intervention:  Activity, Clarification, Education, Problem-solving and Support  Summary of Progress/Problems: Today's group addressed balance in life. Patients participated in the discussion about when their life was in balance and out of balance and how this feels. Patients discussed ways to get back in balance and short term goals they can work on to get where they want to be. [name] stated that he felt "way off balance" and that he is still manic. Patient stated that he is aware of signs that shows he is unbalanced. [name] stated that reading his Bible and other spiritual literature helps him regain balance.

## 2015-12-31 ENCOUNTER — Encounter (HOSPITAL_COMMUNITY): Payer: Self-pay | Admitting: Psychiatry

## 2015-12-31 DIAGNOSIS — F312 Bipolar disorder, current episode manic severe with psychotic features: Secondary | ICD-10-CM | POA: Diagnosis not present

## 2015-12-31 DIAGNOSIS — F1410 Cocaine abuse, uncomplicated: Secondary | ICD-10-CM

## 2015-12-31 HISTORY — DX: Cocaine abuse, uncomplicated: F14.10

## 2015-12-31 LAB — LIPID PANEL
CHOLESTEROL: 255 mg/dL — AB (ref 0–200)
HDL: 50 mg/dL (ref >40)
LDL Cholesterol: 144 mg/dL — ABNORMAL HIGH (ref 0–99)
Total CHOL/HDL Ratio: 5.1 RATIO
Triglycerides: 304 mg/dL — ABNORMAL HIGH (ref <150)
VLDL: 61 mg/dL — AB (ref 0–40)

## 2015-12-31 LAB — TSH: TSH: 1.985 u[IU]/mL (ref 0.350–4.500)

## 2015-12-31 LAB — HEMOGLOBIN A1C
Hgb A1c MFr Bld: 5.6 % (ref 4.8–5.6)
Mean Plasma Glucose: 114 mg/dL

## 2015-12-31 LAB — PROLACTIN: PROLACTIN: 5.5 ng/mL (ref 4.0–15.2)

## 2015-12-31 MED ORDER — LORAZEPAM 1 MG PO TABS: 1.0000 mg | ORAL_TABLET | Freq: Four times a day (QID) | ORAL | Status: DC | PRN

## 2015-12-31 MED ORDER — QUETIAPINE FUMARATE 100 MG PO TABS
250.0000 mg | ORAL_TABLET | Freq: Every day | ORAL | Status: DC
  Administered 2015-12-31 – 2016-01-02 (×3): 250 mg via ORAL
  Filled 2015-12-31 (×4): qty 2.5

## 2015-12-31 MED ORDER — HYDROXYZINE HCL 25 MG PO TABS
25.0000 mg | ORAL_TABLET | Freq: Four times a day (QID) | ORAL | Status: DC | PRN
  Administered 2016-01-01 – 2016-01-03 (×2): 25 mg via ORAL
  Filled 2015-12-31 (×2): qty 1

## 2015-12-31 MED ORDER — QUETIAPINE FUMARATE 25 MG PO TABS
25.0000 mg | ORAL_TABLET | ORAL | Status: DC
  Administered 2015-12-31 – 2016-01-04 (×10): 25 mg via ORAL
  Filled 2015-12-31 (×13): qty 1

## 2015-12-31 NOTE — Progress Notes (Signed)
Nutrition Brief Note  Patient identified on the Malnutrition Screening Tool (MST) Report  Wt Readings from Last 15 Encounters:  12/30/15 160 lb (72.6 kg)  03/29/12 160 lb (72.6 kg)  02/11/12 169 lb (76.7 kg)    Body mass index is 22.96 kg/m. Patient meets criteria for normal weight based on current BMI.   Current diet order is Regular. Pt is eating meals and snacks as desired. Ensure Enlive has already been ordered BID per ONS protocol.    No further nutrition interventions warranted at this time. If nutrition issues arise, please consult RD.     Marc GammonJessica Sammie Denner, MS, RD, LDN Inpatient Clinical Dietitian Pager # [phone] After hours/weekend pager # [phone]

## 2015-12-31 NOTE — Progress Notes (Signed)
Communication note:  Patient complained of elevated blood pressures.  Patient stated that he had been prescribed blood pressure medications in the past, but has not taken them.  This information was conveyed to the treatment team by this Clinical research associatewriter.

## 2015-12-31 NOTE — Progress Notes (Signed)
Recreation Therapy Notes  Date: 12/31/15 Time: 1000 Location: 500 Hall Dayroom  Group Topic: Stress Management  Goal Area(s) Addresses:  Patient will verbalize importance of using healthy stress management.  Patient will identify positive emotions associated with healthy stress management.   Behavioral Response: Engaged  Intervention: Deep Breathing, Guided Imagery  Activity :  Deep Breathing, Depression Imagery.  LRT introduced the stress management techniques of deep breathing and guided imagery.  LRT read scripts aloud to allow patients to guide patients through the techniques. Patients were to follow along as LRT read scripts to participate in the techniques.  Education:  Stress Management, Discharge Planning.   Education Outcome: Acknowledges edcuation/In group clarification offered/Needs additional education  Clinical Observations/Feedback: Pt stated stress can make you sick.  Pt also expressed some of the things that help you release stress is "doing things you like to do" like playing his guitar.  Pt was actively engaged and stated the activity made him feel calmer.    Marc RancherMarjette Ravin [name], LRT/CTRS         Marc RancherLindsay, [name] 12/31/2015 12:15 PM

## 2015-12-31 NOTE — [facility] Suicide Risk Assessment (Signed)
[facility]   Nursing information obtained from:  Patient Demographic factors:  Male Current Mental Status:  NA Loss Factors:  NA Historical Factors:  NA Risk Reduction Factors:  Employed  Total Time spent with patient: 30 minutes Principal Problem: Bipolar disorder, current episode manic severe with psychotic features (HCC) Diagnosis:   Patient Active Problem List   Diagnosis Date Noted  . Bipolar disorder, current episode manic severe with psychotic features (HCC) [F31.2] 12/31/2015  . 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   Continued Clinical Symptoms:    The "Alcohol Use Disorders Identification Test", Guidelines for Use in Primary Care, Second Edition.  World Science writerHealth Organization [facility](WHO). Score between 0-7:  no or low risk or alcohol related problems. Score between 8-15:  moderate risk of alcohol related problems. Score between 16-19:  high risk of alcohol related problems. Score 20 or above:  warrants further diagnostic evaluation for alcohol dependence and treatment.   CLINICAL FACTORS:   Alcohol/Substance Abuse/Dependencies Previous Psychiatric Diagnoses and Treatments   Musculoskeletal: Strength & Muscle Tone: within normal limits Gait & Station: normal Patient leans: N/A  Psychiatric Specialty Exam: Physical Exam  Review of Systems  Psychiatric/Behavioral: The patient is nervous/anxious and has insomnia.   All other systems reviewed and are negative.   Blood pressure (!) 160/91, pulse 75, temperature 98.2 F (36.8 C), temperature source Oral, resp. rate 18, height 5\' 10"  (1.778 m), weight 72.6 kg (160 lb), SpO2 99 %.Body mass index is 22.96 kg/m.            Please see H&P.                                               COGNITIVE FEATURES THAT CONTRIBUTE TO RISK:  Closed-mindedness, Polarized thinking and Thought constriction (tunnel vision)    SUICIDE  RISK:   Moderate:  Frequent suicidal ideation with limited intensity, and duration, some specificity in terms of plans, no associated intent, good self-control, limited dysphoria/symptomatology, some risk factors present, and identifiable protective factors, including available and accessible social support.   PLAN OF CARE: Please see H&P.   I certify that inpatient services furnished can reasonably be expected to improve the patient's condition.  Elizet Kaplan, MD 12/31/2015, 1:33 PM

## 2015-12-31 NOTE — H&P (Signed)
Psychiatric Admission Assessment Adult  Patient Identification: [name] MRN:  [id number] Date of Evaluation:  12/31/2015 Chief Complaint: Pt states "I am manic ."   Principal Diagnosis: Bipolar disorder, current episode manic severe with psychotic features [facility](HCC) Diagnosis:   Patient Active Problem List   Diagnosis Date Noted  . Bipolar disorder, current episode manic severe with psychotic features (HCC) [F31.2] 12/31/2015  . Cocaine use disorder, mild, abuse [F14.10] 12/31/2015   History of Present Illness: [name] is a 55 y.o. caucasian male, who has a hx of Bipolar disorder , is a widower , is employed , lives [place] in [place] , has been noncompliant on his medications on and off and hence decompensated. Pt [facility] for disorganized , manic sx.   Per initial notes in EHR - " .Presented voluntarily for assessment. Pt was referred by a friend who is present during this assessment. Friend reports transporting client to [facility] due to observation of irrational and impulsive behaviors that are not typical of pt. Per friend, these behaviors have been increasing in severity and frequency x 1 month . Pt has history of bipolar disorder and is exhibiting signs of mania.  Pt presents with flight of ideas with rapid and pressured speech, limiting information gathering process. Pt's Friend reports suspicion of heavy cocaine use. Friend is unsure of any additional substances pt may use.Pt reports daily use of alcohol and history of cocaine use. Pt denies regular cocaine use and reports last use to be approximately three weeks ago. Pt denies use of any additional substances.Pt verbally acknowledges current mania. Pt states he has not slept in one week, has been experiencing increased anxiety, and anger, and has been making poor and impulsive decisions. Pt and friend report poor appetite and weight loss. Pt reports turmoil with fianc due to suspicion of substance use and to pt taking out a  loan for $1500. Pt states loan was to assist with son's school expenses (clothing etc...).Pt states he currently resides with his fianc however, is living out of his vehicle by choice. Pt denies suicidal and homicidal ideations.Pt denies any history of aggression and self-injurious behaviors. Pt providing contradictory responses regarding the presence of and any history of hallucinations. Pt reports history of multiple inpatient admissions for bipolar disorder and states he was discharge [facility] one week ago for same complaints. Pt [facility] for medication management and reports concerns with medication effectiveness. Friend reports concerns over witnessing multiple bottles of the same prescriptions filled on the same day but, with various dosages. "   Patient seen and chart reviewed TODAY .Discussed patient with treatment team. Pt today is seen as pressured , impulsive , disorganized , hyperactive with sleep issues. Pt has been spending money inappropriately and having conflicts with colleagues at work place. Pt also with some paranoid issues and hence is a potential danger to self or others. Pt has been taking Li for so long and would like to be continued on the same. Pt reports [facility] - however he was not back to baseline. Pt continued to have some conflicts with fiance and would go and stay in motels and hence would skip his medications on and off. Pt reports sleep issues and confirms above hx .   Associated Signs/Symptoms: Depression Symptoms:  insomnia, (Hypo) Manic Symptoms:  Delusions, Elevated Mood, Grandiosity, Impulsivity, Irritable Mood, Labiality of Mood, Anxiety Symptoms:  Excessive Worry, Psychotic Symptoms:  Delusions,  Paranoia, PTSD Symptoms: Negative Total Time spent with patient: 45 minutes  Past Psychiatric History: denies suicide attempts - has had admissions to IP hospitals before , [facility] - 2 weeks ago. Pt follows up  with [facility] .  Is the patient at risk to self? Yes.    Has the patient been a risk to self in the past 6 months? Yes.    Has the patient been a risk to self within the distant past? No.  Is the patient a risk to others? Yes.    Has the patient been a risk to others in the past 6 months? Yes.    Has the patient been a risk to others within the distant past? No.   Prior Inpatient Therapy:   Prior Outpatient Therapy:    Alcohol Screening: Patient refused Alcohol Screening Tool: Yes 1. How often do you have a drink containing alcohol?: 2 to 3 times a week 2. How many drinks containing alcohol do you have on a typical day when you are drinking?: 3 or 4 3. How often do you have six or more drinks on one occasion?: Never Preliminary Score: 1 Brief Intervention: AUDIT score less than 7 or less-screening does not suggest unhealthy drinking-brief intervention not indicated Substance Abuse History in the last 12 months:  Yes.  cocaine on and off , alcohol occasional Consequences of Substance Abuse: Negative Previous Psychotropic Medications: Yes lithium Psychological Evaluations: No  Past Medical History: reports hx of being placed on antihypertensives before , but never took it. Past Medical History:  Diagnosis Date  . Bipolar 1 disorder (HCC)    History reviewed. No pertinent surgical history. Family History:  Family History  Problem Relation Age of Onset  . Hyperlipidemia Mother   . Alcoholism Mother   . Suicidality Cousin    Family Psychiatric  History: see above- his wife also committed suicide - they were separated at that time. Tobacco Screening: Have you used any form of tobacco in the last 30 days? (Cigarettes, Smokeless Tobacco, Cigars, and/or Pipes): No Social History: employed , fiance is supportive , has two boys from his exwife - they live with their grand parents . History  Alcohol Use No    Comment: occasionally     History  Drug Use No    Comment: Pt denies  substance use     Additional Social History:                           Allergies:   Allergies  Allergen Reactions  . Medrol [Methylprednisolone] Rash   Lab Results:  Results for orders placed or performed during the hospital encounter of 12/30/15 (from the past 48 hour(s))  TSH     Status: None   Collection Time: 12/31/15  6:34 AM  Result Value Ref Range   TSH 1.985 0.350 - 4.500 uIU/mL    Comment: Performed at [facility]  Lipid panel     Status: Abnormal   Collection Time: 12/31/15  6:34 AM  Result Value Ref Range   Cholesterol 255 (H) 0 - 200 mg/dL   Triglycerides 161 (H) <150 mg/dL   HDL 50 >09 mg/dL   Total CHOL/HDL Ratio 5.1 RATIO   VLDL 61 (H) 0 - 40 mg/dL   LDL Cholesterol 604 (H) 0 - 99 mg/dL    Comment:        Total Cholesterol/HDL:CHD Risk Coronary Heart Disease Risk Table  Men   Women  1/2 Average Risk   3.4   3.3  Average Risk       5.0   4.4  2 X Average Risk   9.6   7.1  3 X Average Risk  23.4   11.0        Use the calculated Patient Ratio above and the CHD Risk Table to determine the patient's CHD Risk.        ATP III CLASSIFICATION (LDL):  <100     mg/dL   Optimal  161-096  mg/dL   Near or Above                    Optimal  130-159  mg/dL   Borderline  045-409  mg/dL   High  >811     mg/dL   Very High Performed at [facility]     Blood Alcohol level:  Lab Results  Component Value Date   ETH 9 (H) 12/29/2015   ETH <11 03/29/2012    Metabolic Disorder Labs:  Lab Results  Component Value Date   HGBA1C 5.6 12/30/2015   MPG 114 12/30/2015   Lab Results  Component Value Date   PROLACTIN 5.5 12/30/2015   Lab Results  Component Value Date   CHOL 255 (H) 12/31/2015   TRIG 304 (H) 12/31/2015   HDL 50 12/31/2015   CHOLHDL 5.1 12/31/2015   VLDL 61 (H) 12/31/2015   LDLCALC 144 (H) 12/31/2015   LDLCALC 149 (H) 12/30/2015    Current Medications: Current Facility-Administered  Medications  Medication Dose Route Frequency Provider Last Rate Last Dose  . acetaminophen (TYLENOL) tablet 650 mg  650 mg Oral Q6H PRN Jomarie Longs, MD      . alum & mag hydroxide-simeth (MAALOX/MYLANTA) 200-200-20 MG/5ML suspension 30 mL  30 mL Oral Q4H PRN Minh Jasper, MD      . feeding supplement (ENSURE ENLIVE) (ENSURE ENLIVE) liquid 237 mL  237 mL Oral BID BM Ona Roehrs, MD   237 mL at 12/31/15 1117  . hydrOXYzine (ATARAX/VISTARIL) tablet 25 mg  25 mg Oral Q6H PRN Michalle Rademaker, MD      . lithium carbonate (ESKALITH) CR tablet 450 mg  450 mg Oral Q12H Emarion Toral, MD   450 mg at 12/31/15 0748  . LORazepam (ATIVAN) tablet 1 mg  1 mg Oral Q6H PRN Adolphus Hanf, MD      . magnesium hydroxide (MILK OF MAGNESIA) suspension 30 mL  30 mL Oral Daily PRN Gayathri Futrell, MD      . OLANZapine zydis (ZYPREXA) disintegrating tablet 10 mg  10 mg Oral TID PRN Jomarie Longs, MD       Or  . OLANZapine (ZYPREXA) injection 10 mg  10 mg Intramuscular TID PRN Jomarie Longs, MD      . QUEtiapine (SEROQUEL) tablet 200 mg  200 mg Oral QHS Jomarie Longs, MD   200 mg at 12/30/15 2154  . QUEtiapine (SEROQUEL) tablet 25 mg  25 mg Oral [id number] Jomarie Longs, MD   25 mg at 12/31/15 1117   PTA Medications: Prescriptions Prior to Admission  Medication Sig Dispense Refill Last Dose  . buPROPion (WELLBUTRIN XL) 300 MG 24 hr tablet Take 1 tablet (300 mg total) by mouth every morning. (Patient not taking: Reported on 12/29/2015) 30 tablet 0 Not Taking at Unknown time  . cyclobenzaprine (FLEXERIL) 5 MG tablet Take 1 tablet (5 mg total) by mouth 3 (three) times daily as needed for muscle  spasms. (Patient not taking: Reported on 12/29/2015) 30 tablet 0 Completed Course at Unknown time  . diclofenac (CATAFLAM) 50 MG tablet Take 1 tablet (50 mg total) by mouth 3 (three) times daily. For back pain (Patient not taking: Reported on 12/29/2015) 30 tablet 0 Not Taking at Unknown time  . lithium carbonate (ESKALITH) 450  MG CR tablet Take 450 mg by mouth every 12 (twelve) hours.   12/28/2015 at Unknown time  . lithium carbonate 150 MG capsule Take 3 capsules (450 mg total) by mouth 2 (two) times daily with a meal. (Patient not taking: Reported on 12/29/2015) 180 capsule 0 Not Taking at Unknown time  . lithium carbonate 300 MG capsule Take 600 mg by mouth at bedtime.   12/28/2015 at Unknown time  . lurasidone (LATUDA) 80 MG TABS Take 1 tablet (80 mg total) by mouth daily after supper. (Patient not taking: Reported on 12/29/2015) 30 tablet 0 Not Taking at Unknown time  . Multiple Vitamin (MULTIVITAMIN WITH MINERALS) TABS Take 1 tablet by mouth daily. (Patient not taking: Reported on 12/29/2015) 30 tablet 0 Not Taking at Unknown time  . QUEtiapine (SEROQUEL) 100 MG tablet Take 100 mg by mouth 2 (two) times daily.    Past Week at Unknown time  . QUEtiapine (SEROQUEL) 200 MG tablet Take 200 mg by mouth at bedtime.   12/27/2015 at Unknown time  . temazepam (RESTORIL) 30 MG capsule Take 30 mg by mouth at bedtime as needed for sleep.   Past Week at Unknown time  . traZODone (DESYREL) 100 MG tablet Take 1 tablet (100 mg total) by mouth at bedtime as needed for sleep. 30 tablet 0 Past Week at Unknown time    Musculoskeletal: Strength & Muscle Tone: within normal limits Gait & Station: normal Patient leans: N/A  Psychiatric Specialty Exam: Physical Exam  Nursing note and vitals reviewed. Constitutional:  I concur with PE done in ED.    Review of Systems  Psychiatric/Behavioral: Positive for substance abuse. The patient is nervous/anxious and has insomnia.   All other systems reviewed and are negative.   Blood pressure (!) 160/91, pulse 75, temperature 98.2 F (36.8 C), temperature source Oral, resp. rate 18, height 5\' 10"  (1.778 m), weight 72.6 kg (160 lb), SpO2 99 %.Body mass index is 22.96 kg/m.  General Appearance: Casual  Eye Contact:  Fair  Speech:  Pressured  Volume:  Increased  Mood:  Anxious  Affect:  Labile   Thought Process:  Disorganized and Descriptions of Associations: Tangential  Orientation:  Full (Time, Place, and Person)  Thought Content:  Delusions, Paranoid Ideation and Rumination  Suicidal Thoughts:  denies , but is very paranoid , delusional and hence a danger to self or others  Homicidal Thoughts:  No  Memory:  Immediate;   Fair Recent;   Fair Remote;   Fair  Judgement:  Impaired  Insight:  Shallow  Psychomotor Activity:  Increased  Concentration:  Concentration: Fair and Attention Span: Fair  Recall:  [employer] of Knowledge:  Fair  Language:  Fair  Akathisia:  No  Handed:  Right  AIMS (if indicated):     Assets:  Desire for Improvement  ADL's:  Intact  Cognition:  WNL  Sleep:  Number of Hours: 6.75    Treatment Plan Summary:[name] is a 55 y.o. caucasian male, who has a hx of Bipolar disorder , is a widower , is employed , lives with fiance in [place] , has been noncompliant on his medications  on and off and hence decompensated. Pt presented to [facility] for disorganized , manic sx.  Patient continues to be pressured , labile, paranoid , tangential - will start medications and continue inpatient stay. Daily contact with patient to assess and evaluate symptoms and progress in treatment and Medication management  Patient will benefit from inpatient treatment and stabilization.  Estimated length of stay is 5-7 days.  Reviewed past medical records,treatment plan.  Will continue Li 450 mg po bid for mood sx. Li level -  ( 12/30/15) 0.06 - will repeat level in 5 days. Will increase Seroquel to 25 mg po bid for mood sx, paranoia, and increase Seroquel to 250 mg po qhs for sleep, moodsx Will continue to monitor vitals ,medication compliance and treatment side effects while patient is here.  Will monitor for medical issues as well as call consult as needed.  Reviewed labs BAL - 9 , uds - pos for BZD , CBC - wnl , cmp - wnl tsh- wnl, lipid panel - abnormal - will get dietician  consult , ekg for qtc - wnl ,will order hba1c, pl . CSW will start working on disposition.  Patient to participate in therapeutic milieu .      Observation Level/Precautions:  15 minute checks    Psychotherapy:  Individual and group therapy     Consultations:  CSW  Discharge Concerns:  Stability and safety       Physician Treatment Plan for Primary Diagnosis: Bipolar disorder, current episode manic severe with psychotic features (HCC) Long Term Goal(s): Improvement in symptoms so as ready for discharge  Short Term Goals: Ability to disclose and discuss suicidal ideas and Compliance with prescribed medications will improve  Physician Treatment Plan for Secondary Diagnosis: Principal Problem:   Bipolar disorder, current episode manic severe with psychotic features (HCC) Active Problems:   Cocaine use disorder, mild, abuse  Long Term Goal(s): Improvement in symptoms so as ready for discharge  Short Term Goals: Ability to disclose and discuss suicidal ideas and Compliance with prescribed medications will improve  I certify that inpatient services furnished can reasonably be expected to improve the patient's condition.    Jomarie Longs, MD 9/15/20171:35 PM

## 2015-12-31 NOTE — [facility] Group Notes (Signed)
[facility] LCSW Group Therapy  12/31/2015  1:05 PM  Type of Therapy:  Group therapy  Participation Level:  Active  Participation Quality:  Attentive  Affect:  Flat  Cognitive:  Oriented  Insight:  Limited  Engagement in Therapy:  Limited  Modes of Intervention:  Discussion, Socialization  Summary of Progress/Problems:  Chaplain was here to lead a group on themes of hope and courage.  Told a long story about family and traditions. Father died last year.  Rather tangential.  Stayed the entire time, paying attention throughout.  Daryel Geraldorth, Ondria Oswald B 12/31/2015 1:25 PM

## 2015-12-31 NOTE — Progress Notes (Signed)
Patient ID: [name], male   DOB: 1960/10/21, 55 y.o.   MRN: [id number]  Adult Psychoeducational Group Note  Date:  12/31/2015 Time:  08:00pm   Group Topic/Focus:  Wrap-Up Group:   The focus of this group is to help patients review their daily goal of treatment and discuss progress on daily workbooks.   Participation Level:  Active  Participation Quality:  Appropriate  Affect:  Blunted  Cognitive:  Alert and Oriented  Insight: Improving  Engagement in Group:  Engaged and Improving  Modes of Intervention:  Discussion, Orientation and Support  Additional Comments:  Pt reports that his goal for today was to get back on the right medications. Pt reports that he wants to start feeling better on his "seroquel and lithium." Pt reports a good visit tonight.   [name], Alzora Ha E 12/31/2015, 8:47 PM

## 2015-12-31 NOTE — Progress Notes (Signed)
Upon approach patient was noted to have a bright affect, smiling at Clinical research associatewriter and cheerful.  Patient denies SI, HI, and AVH and reports low levels of anxiety.  Patient participated in groups and was compliant with therapy.  Patient had no incidents of behavorial dyscontrol.   Assess for safety, engage patient in 1:1 staff talks, offer medications as prescribed.   Continue to monitor per plan, patient able to maintain safety with minimal staff intervention.

## 2015-12-31 NOTE — Progress Notes (Signed)
Recreation Therapy Notes  INPATIENT RECREATION THERAPY ASSESSMENT  Patient Details Name: [name] MRN: [id number] DOB: 1960/08/13 Today's Date: 12/31/2015  Patient Stressors: Family, Work  Pt stated he was here because his medications were out of wack.  Coping Skills:   Substance Abuse, Exercise, Talking, Music  Personal Challenges: Anger, Communication, Concentration, Decision-Making, Expressing Yourself, Problem-Solving, Relationships, Self-Esteem/Confidence, Social Interaction, Stress Management, Substance Abuse, Time Management, Trusting Others, Work Nutritional therapisterformance  Leisure Interests (2+):  Music - Risk managerlay instrument, Individual - Writing  Awareness of Community Resources:  Yes  Community Resources:  Research scientist (physical sciences)[employer], [place], [employer]  Current Use: Yes  Patient Strengths:  Ability to communicate with others, support children  Patient Identified Areas of Improvement:  Making more wise decisions about money, better communicate with mom  Current Recreation Participation:  Everyday  Patient Goal for Hospitalization:  "Get stabilized on medication"  [place]  [place] Residence[place]   Current [place] (including self-harm):  No  Current HI:  No  Consent to Intern Participation: N/A   Caroll RancherMarjette Consandra [name], LRT/CTRS   Caroll RancherLindsay, [name] A 12/31/2015, 12:44 PM

## 2015-12-31 NOTE — [facility] Suicide Risk Assessment (Signed)
[facility] INPATIENT:  Family/Significant Other Suicide Prevention Education  Suicide Prevention Education:  Education Completed; No one has been identified by the patient as the family member/significant other with whom the patient will be residing, and identified as the person(s) who will aid the patient in the event of a mental health crisis (suicidal ideations/suicide attempt).  With written consent from the patient, the family member/significant other has been provided the following suicide prevention education, prior to the and/or following the discharge of the patient.  The suicide prevention education provided includes the following:  Suicide risk factors  Suicide prevention and interventions  National Suicide Hotline telephone number  [facility] assessment telephone number  [facility] Emergency Assistance 911  [facility] and/or Residential Mobile Crisis Unit telephone number  Request made of family/significant other to:  Remove weapons (e.g., guns, rifles, knives), all items previously/currently identified as safety concern.    Remove drugs/medications (over-the-counter, prescriptions, illicit drugs), all items previously/currently identified as a safety concern.  The family member/significant other verbalizes understanding of the suicide prevention education information provided.  The family member/significant other agrees to remove the items of safety concern listed above. The patient did not endorse SI at the time of admission, nor did the patient c/o SI during the stay here.  SPE not required.   Baldo DaubRodney B [facility] 12/31/2015, 4:27 PM

## 2016-01-01 DIAGNOSIS — F312 Bipolar disorder, current episode manic severe with psychotic features: Secondary | ICD-10-CM | POA: Diagnosis not present

## 2016-01-01 LAB — HEMOGLOBIN A1C
HEMOGLOBIN A1C: 5.5 % (ref 4.8–5.6)
Mean Plasma Glucose: 111 mg/dL

## 2016-01-01 LAB — PROLACTIN: Prolactin: 16 ng/mL — ABNORMAL HIGH (ref 4.0–15.2)

## 2016-01-01 NOTE — Progress Notes (Signed)
[facility] MD Progress Note  01/01/2016 3:22 PM [name]  MRN:  [id number] Subjective: Patient states " I came to the hospital because I was having manic symptoms". Reports he was experiencing racing thoughts, increased reate of speech, and impulsivity/ increased spending . At this time states he is feeling better, and as he improves he is focusing on disposition planning, expressing hope he may be discharged soon. Objective : I have met with patient and have reviewed chart notes. Patient reports manic symptoms are subsiding and he now feels " more like my usual self ". At this time not loud or pressured. Presents with some anxiety. Chart notes indicate increased drug and alcohol abuse recently - patient is not currently presenting with any withdrawal symptoms - no tremors, no diaphoresis, no acute distress . Vitals are stable . Denies medication side effects. Visible on unit , some group participation, no disruptive or agitated behaviors .  Principal Problem: Bipolar disorder, current episode manic severe with psychotic features [facility]) Diagnosis:   Patient Active Problem List   Diagnosis Date Noted  . Bipolar disorder, current episode manic severe with psychotic features ([place]) [F31.2] 12/31/2015  . Cocaine use disorder, mild, abuse [F14.10] 12/31/2015   Total Time spent with patient: 20 minutes    Past Medical History:  Past Medical History:  Diagnosis Date  . Bipolar 1 disorder ([place])    History reviewed. No pertinent surgical history. Family History:  Family History  Problem Relation Age of Onset  . Hyperlipidemia Mother   . Alcoholism Mother   . Suicidality Cousin     Social History:  History  Alcohol Use No    Comment: occasionally     History  Drug Use No    Comment: Pt denies substance use     Social History   Social History  . Marital status: Single    Spouse name: N/A  . Number of children: N/A  . Years of education: N/A   Social History Main Topics  .  Smoking status: Never Smoker  . Smokeless tobacco: Never Used  . Alcohol use No     Comment: occasionally  . Drug use: No     Comment: Pt denies substance use   . Sexual activity: Not Asked   Other Topics Concern  . None   Social History Narrative  . None   Additional Social History:   Sleep: Fair  Appetite:  Fair  Current Medications: Current Facility-Administered Medications  Medication Dose Route Frequency Provider Last Rate Last Dose  . acetaminophen (TYLENOL) tablet 650 mg  650 mg Oral Q6H PRN Ursula Alert, MD      . alum & mag hydroxide-simeth (MAALOX/MYLANTA) 200-200-20 MG/5ML suspension 30 mL  30 mL Oral Q4H PRN Saramma Eappen, MD      . feeding supplement (ENSURE ENLIVE) (ENSURE ENLIVE) liquid 237 mL  237 mL Oral BID BM Saramma Eappen, MD   237 mL at 01/01/16 1403  . hydrOXYzine (ATARAX/VISTARIL) tablet 25 mg  25 mg Oral Q6H PRN Ursula Alert, MD   25 mg at 01/01/16 0418  . lithium carbonate (ESKALITH) CR tablet 450 mg  450 mg Oral Q12H Ursula Alert, MD   450 mg at 01/01/16 0805  . LORazepam (ATIVAN) tablet 1 mg  1 mg Oral Q6H PRN Saramma Eappen, MD      . magnesium hydroxide (MILK OF MAGNESIA) suspension 30 mL  30 mL Oral Daily PRN Ursula Alert, MD      . OLANZapine zydis (ZYPREXA) disintegrating  tablet 10 mg  10 mg Oral TID PRN Ursula Alert, MD       Or  . OLANZapine (ZYPREXA) injection 10 mg  10 mg Intramuscular TID PRN Ursula Alert, MD      . QUEtiapine (SEROQUEL) tablet 25 mg  25 mg Oral [id number] Saramma Eappen, MD   25 mg at 01/01/16 1402  . QUEtiapine (SEROQUEL) tablet 250 mg  250 mg Oral QHS Ursula Alert, MD   250 mg at 12/31/15 2138    Lab Results:  Results for orders placed or performed during the hospital encounter of 12/30/15 (from the past 48 hour(s))  TSH     Status: None   Collection Time: 12/31/15  6:34 AM  Result Value Ref Range   TSH 1.985 0.350 - 4.500 uIU/mL    Comment: Performed at [facility]  Lipid panel      Status: Abnormal   Collection Time: 12/31/15  6:34 AM  Result Value Ref Range   Cholesterol 255 (H) 0 - 200 mg/dL   Triglycerides 304 (H) <150 mg/dL   HDL 50 >40 mg/dL   Total CHOL/HDL Ratio 5.1 RATIO   VLDL 61 (H) 0 - 40 mg/dL   LDL Cholesterol 144 (H) 0 - 99 mg/dL    Comment:        Total Cholesterol/HDL:CHD Risk Coronary Heart Disease Risk Table                     Men   Women  1/2 Average Risk   3.4   3.3  Average Risk       5.0   4.4  2 X Average Risk   9.6   7.1  3 X Average Risk  23.4   11.0        Use the calculated Patient Ratio above and the CHD Risk Table to determine the patient's CHD Risk.        ATP III CLASSIFICATION (LDL):  <100     mg/dL   Optimal  100-129  mg/dL   Near or Above                    Optimal  130-159  mg/dL   Borderline  160-189  mg/dL   High  >190     mg/dL   Very High Performed at [facility]   Hemoglobin A1c     Status: None   Collection Time: 12/31/15  6:34 AM  Result Value Ref Range   Hgb A1c MFr Bld 5.5 4.8 - 5.6 %    Comment: (NOTE)         Pre-diabetes: 5.7 - 6.4         Diabetes: >6.4         Glycemic control for adults with diabetes: <7.0    Mean Plasma Glucose 111 mg/dL    Comment: (NOTE) Performed At: [facility] [place], [place] [id number] Lindon Romp MD [id number] Performed at [facility]   Prolactin     Status: Abnormal   Collection Time: 12/31/15  6:34 AM  Result Value Ref Range   Prolactin 16.0 (H) 4.0 - 15.2 ng/mL    Comment: (NOTE) Performed At: [facility] [address] [place], [place] [id number] Lindon Romp MD [id number] Performed at [facility]     Blood Alcohol level:  Lab Results  Component Value Date   ETH 9 (H) 12/29/2015   ETH <11  83/41/9622    Metabolic Disorder Labs: Lab Results  Component Value Date   HGBA1C 5.5 12/31/2015   MPG 111 12/31/2015   MPG 114 12/30/2015   Lab Results  Component  Value Date   PROLACTIN 16.0 (H) 12/31/2015   PROLACTIN 5.5 12/30/2015   Lab Results  Component Value Date   CHOL 255 (H) 12/31/2015   TRIG 304 (H) 12/31/2015   HDL 50 12/31/2015   CHOLHDL 5.1 12/31/2015   VLDL 61 (H) 12/31/2015   LDLCALC 144 (H) 12/31/2015   LDLCALC 149 (H) 12/30/2015    Physical Findings: AIMS: Facial and Oral Movements Muscles of Facial Expression: None, normal Lips and Perioral Area: None, normal Jaw: None, normal Tongue: None, normal,Extremity Movements Upper (arms, wrists, hands, fingers): None, normal Lower (legs, knees, ankles, toes): None, normal, Trunk Movements Neck, shoulders, hips: None, normal, Overall Severity Severity of abnormal movements (highest score from questions above): None, normal Incapacitation due to abnormal movements: None, normal Patient's awareness of abnormal movements (rate only patient's report): No Awareness, Dental Status Current problems with teeth and/or dentures?: No Does patient usually wear dentures?: No  CIWA:  CIWA-Ar Total: 1 COWS:     Musculoskeletal: Strength & Muscle Tone: within normal limits- currently no gross tremors or diaphoresis, no psychomotor restlessness  Gait & Station: normal Patient leans: Right and N/A  Psychiatric Specialty Exam: Physical Exam  ROS denies headache, denies  chest pain or shortness of breath at this time   Blood pressure (!) 146/86, pulse 93, temperature 98.4 F (36.9 C), temperature source Oral, resp. rate 18, height _0  (1.778 m), weight 160 lb (72.6 kg), SpO2 99 %.Body mass index is 22.96 kg/m.  General Appearance: Fairly Groomed  Eye Contact:  Good  Speech:  rapid at times, but less pressured   Volume:  Normal  Mood:  as above, presenting with manic symptoms- mood gradually improving   Affect:  Labile and anxious  Thought Process:  Tends to be over inclusive , vaguely disorganized at times   Orientation:  Full (Time, Place, and Person)  Thought Content:  denies  hallucinations, no delusions, not internally preocupied   Suicidal Thoughts:  No- he denies any suicidal or self injurious ideations at this time   Homicidal Thoughts:  No- he denies any homicidal ideations   Memory:  recent and remote grossly intact   Judgement:  Fair- improving   Insight:  Fair  Psychomotor Activity:  Normal- no psychomotor agitation or restlessness at this time   Concentration:  Concentration: Good and Attention Span: Good  Recall:  Good  Fund of Knowledge:  Good  Language:  Good  Akathisia:  Negative  Handed:  Right  AIMS (if indicated):     Assets:  Desire for Improvement Resilience  ADL's:  Fair   Cognition:  WNL  Sleep:  Number of Hours: 6   Assessment - patient is a 55 year old male, history of mood disorder, presented with symptoms of mania, which are now improving, although continues to have rapid speech, vague thought disorder . Currently no SI or HI, no disruptive behaviors on unit. Tolerating medications well. Reports sleep has improved . Treatment Plan Summary: Daily contact with patient to assess and evaluate symptoms and progress in treatment, Medication management, Plan inpatient admission  and medications as below Continue to encourage group and milieu participation to work on coping skills and symptom reduction  Continue Seroquel 25 mgrs BID and 250 mgrs QHS for Mood Disorder Continue Lithium 450 mgrs BID  for mood disorder  Continue Ativan 1 mgr Q 6 hours PRN for anxiety, agitation  Treatment team working on disposition planning  Neita Garnet, MD 01/01/2016, 3:22 PM

## 2016-01-01 NOTE — Progress Notes (Signed)
D:Pt continues to have pressured rapid speech. Pt talked about his former wife that worked at [employer] [facility] and committed suicide. He says that he spent much money before coming to the hospital and his girlfriend was concerned. Pt denies any withdrawal symptoms. He has mild anxiety with no tremors observed or felt at this time. A:Offered support, encouragement and 15 minute checks.  R:Safety maintained on the 500 hall.

## 2016-01-01 NOTE — Progress Notes (Signed)
D: Pt is alert and oriented x4. Pt at the time of assessment endorsed moderate anxiety; states, "I feel much better; I feel like my Seroquel is working." Pt denies SI, HI, pain AVH. Pt remains withdrawn to self while in the day room. Pt is very hyper-verbal however, remained calm and cooperative. A: Medications offered as prescribed.  Support, encouragement, and safe environment provided.  15-minute safety checks continue. R: Pt was med compliant.  Pt attended wrap-up group. Safety checks continue.

## 2016-01-01 NOTE — Progress Notes (Signed)
Adult Psychoeducational Group Note  Date:  01/01/2016 Time:  8:59 PM  Group Topic/Focus:  Wrap-Up Group:   The focus of this group is to help patients review their daily goal of treatment and discuss progress on daily workbooks.   Participation Level:  Active  Participation Quality:  Appropriate  Affect:  Appropriate  Cognitive:  Appropriate  Insight: Appropriate  Engagement in Group:  Engaged  Modes of Intervention:  Discussion  Additional Comments: The patient expressed that he rates today a 10.The patient also said that he attended all groups Octavio Mannshigpen, Carvell Hoeffner Lee 01/01/2016, 8:59 PM

## 2016-01-01 NOTE — Plan of Care (Signed)
Problem: Activity: Goal: Interest or engagement in activities will improve Outcome: Progressing Pt has been out in the dayroom participating on the unit. Goal: Sleeping patterns will improve Outcome: Progressing Pt reports that he slept fair last night with sleep medication

## 2016-01-01 NOTE — Progress Notes (Signed)
Pt's FMLA papers given to SW Candace.

## 2016-01-01 NOTE — [facility] Group Notes (Signed)
[facility] LCSW Group Therapy  01/01/2016 12:48 PM  Type of Therapy:  Group Therapy  Participation Level:  Minimal  Participation Quality:  Attentive  Affect:  Appropriate  Cognitive:  Alert  Insight:  Limited  Engagement in Therapy:  Limited  Modes of Intervention:  Discussion, Education, Socialization and Support  Summary of Progress/Problems: Balance in life: Patients will discuss the concept of balance and how it looks and feels to be unbalanced. Pt will identify areas in their life that is unbalanced and ways to become more balanced. Pt attended group and stayed the entire time. Pt sat quietly and listened to other group members share.   Yoshiaki Kreuser L Odelia Graciano 01/01/2016, 12:48 PM

## 2016-01-01 NOTE — Progress Notes (Signed)
Pt's blood pressure elevated. Pt has no signs and symptoms of distress. Reported to MD. No new orders. Safety maintained on the unit.

## 2016-01-02 DIAGNOSIS — F312 Bipolar disorder, current episode manic severe with psychotic features: Secondary | ICD-10-CM | POA: Diagnosis not present

## 2016-01-02 NOTE — Progress Notes (Signed)
Patient ID: [name], male   DOB: [birth year], 55 y.o.   MRN: [id number]   Pt currently presents with an anxious affect and cooperative behavior. Pt reports to writer that their goal is to "take my medications." Pt states "it's been an okay day, I'm trying to get ready to leave on Tuesday." Pt reports good sleep with current medication regimen.   Pt provided with medications per providers orders. Pt's labs and vitals were monitored throughout the night. Pt supported emotionally and encouraged to express concerns and questions. Pt educated on medications.  Pt's safety ensured with 15 minute and environmental checks. Pt currently denies SI/HI and A/V hallucinations. Pt verbally agrees to seek staff if SI/HI or A/VH occurs and to consult with staff before acting on any harmful thoughts. Pt wakes at 0400 claiming to be unable to go back to sleep, pt requests a vistaril.  Will continue POC.

## 2016-01-02 NOTE — Progress Notes (Signed)
D: Pt is alert and oriented x4. Pt at the time of assessment continues to endorse moderate anxiety; states, "I feel like my Seroquel is working." Pt denies SI, HI, pain or AVH. Pt remains withdrawn to self while in the day room. Pt remained calm and cooperative. A: Medications offered as prescribed.  Support, encouragement, and safe environment provided.  15-minute safety checks continue. R: Pt was med compliant.  Pt attended wrap-up group. Safety checks continue.

## 2016-01-02 NOTE — Progress Notes (Signed)
[facility] MD Progress Note  01/02/2016 1:33 PM [name]  MRN:  [id number] Subjective: Patient states he feels improved and back to his baseline mood. States at this time does not feel manic, and denies racing thoughts, agitation . Reports sleep has been "OK". He denies medication side effects.  Objective : I have reviewed chart notes and have met with patient . Presents improved compared to admission, and at this time reports mood is " back to normal". No pressured speech, no psychomotor agitation, affect not overtly irritable  or expansive . Denies medication side effects. Visible on unit, going to groups, behaviors in good control .  Principal Problem: Bipolar disorder, current episode manic severe with psychotic features [facility]) Diagnosis:   Patient Active Problem List   Diagnosis Date Noted  . Bipolar disorder, current episode manic severe with psychotic features ([place]) [F31.2] 12/31/2015  . Cocaine use disorder, mild, abuse [F14.10] 12/31/2015   Total Time spent with patient: 20 minutes    Past Medical History:  Past Medical History:  Diagnosis Date  . Bipolar 1 disorder ([place])    History reviewed. No pertinent surgical history. Family History:  Family History  Problem Relation Age of Onset  . Hyperlipidemia Mother   . Alcoholism Mother   . Suicidality Cousin     Social History:  History  Alcohol Use No    Comment: occasionally     History  Drug Use No    Comment: Pt denies substance use     Social History   Social History  . Marital status: Single    Spouse name: N/A  . Number of children: N/A  . Years of education: N/A   Social History Main Topics  . Smoking status: Never Smoker  . Smokeless tobacco: Never Used  . Alcohol use No     Comment: occasionally  . Drug use: No     Comment: Pt denies substance use   . Sexual activity: Not Asked   Other Topics Concern  . None   Social History Narrative  . None   Additional Social History:   Sleep:  improving   Appetite:  Improving   Current Medications: Current Facility-Administered Medications  Medication Dose Route Frequency Provider Last Rate Last Dose  . acetaminophen (TYLENOL) tablet 650 mg  650 mg Oral Q6H PRN Ursula Alert, MD      . alum & mag hydroxide-simeth (MAALOX/MYLANTA) 200-200-20 MG/5ML suspension 30 mL  30 mL Oral Q4H PRN Saramma Eappen, MD      . feeding supplement (ENSURE ENLIVE) (ENSURE ENLIVE) liquid 237 mL  237 mL Oral BID BM Saramma Eappen, MD   237 mL at 01/02/16 0844  . hydrOXYzine (ATARAX/VISTARIL) tablet 25 mg  25 mg Oral Q6H PRN Ursula Alert, MD   25 mg at 01/01/16 0418  . lithium carbonate (ESKALITH) CR tablet 450 mg  450 mg Oral Q12H Ursula Alert, MD   450 mg at 01/02/16 0843  . LORazepam (ATIVAN) tablet 1 mg  1 mg Oral Q6H PRN Saramma Eappen, MD      . magnesium hydroxide (MILK OF MAGNESIA) suspension 30 mL  30 mL Oral Daily PRN Saramma Eappen, MD      . OLANZapine zydis (ZYPREXA) disintegrating tablet 10 mg  10 mg Oral TID PRN Ursula Alert, MD       Or  . OLANZapine (ZYPREXA) injection 10 mg  10 mg Intramuscular TID PRN Ursula Alert, MD      . QUEtiapine (SEROQUEL) tablet 25 mg  25 mg Oral [id number] Ursula Alert, MD   25 mg at 01/02/16 0843  . QUEtiapine (SEROQUEL) tablet 250 mg  250 mg Oral QHS Ursula Alert, MD   250 mg at 01/01/16 2235    Lab Results:  No results found for this or any previous visit (from the past 48 hour(s)).  Blood Alcohol level:  Lab Results  Component Value Date   ETH 9 (H) 12/29/2015   ETH <11 38/01/1750    Metabolic Disorder Labs: Lab Results  Component Value Date   HGBA1C 5.5 12/31/2015   MPG 111 12/31/2015   MPG 114 12/30/2015   Lab Results  Component Value Date   PROLACTIN 16.0 (H) 12/31/2015   PROLACTIN 5.5 12/30/2015   Lab Results  Component Value Date   CHOL 255 (H) 12/31/2015   TRIG 304 (H) 12/31/2015   HDL 50 12/31/2015   CHOLHDL 5.1 12/31/2015   VLDL 61 (H) 12/31/2015   LDLCALC 144  (H) 12/31/2015   LDLCALC 149 (H) 12/30/2015    Physical Findings: AIMS: Facial and Oral Movements Muscles of Facial Expression: None, normal Lips and Perioral Area: None, normal Jaw: None, normal Tongue: None, normal,Extremity Movements Upper (arms, wrists, hands, fingers): None, normal Lower (legs, knees, ankles, toes): None, normal, Trunk Movements Neck, shoulders, hips: None, normal, Overall Severity Severity of abnormal movements (highest score from questions above): None, normal Incapacitation due to abnormal movements: None, normal Patient's awareness of abnormal movements (rate only patient's report): No Awareness, Dental Status Current problems with teeth and/or dentures?: No Does patient usually wear dentures?: No  CIWA:  CIWA-Ar Total: 3 COWS:     Musculoskeletal: Strength & Muscle Tone: within normal limits- currently no gross tremors or diaphoresis, no psychomotor restlessness  Gait & Station: normal Patient leans: Right and N/A  Psychiatric Specialty Exam: Physical Exam  ROS denies headache, denies  chest pain or shortness of breath at this time   Blood pressure 134/82, pulse 86, temperature 98.5 F (36.9 C), temperature source Oral, resp. rate 18, height '5\' 10"'$  (1.778 m), weight 160 lb (72.6 kg), SpO2 99 %.Body mass index is 22.96 kg/m.  General Appearance: Fairly Groomed  Eye Contact:  Good  Speech:  Normal Rate  Volume:  Normal  Mood:   Reports he feels his mood is " normal" at this time. Presents improved compared to admission   Affect:  more reactive, still mildly anxious   Thought Process:  Tends to be over inclusive , vaguely disorganized at times   Orientation:  Full (Time, Place, and Person)  Thought Content:  denies hallucinations, no delusions, not internally preocupied   Suicidal Thoughts:  No- he denies any suicidal or self injurious ideations at this time   Homicidal Thoughts:  No- he denies any homicidal ideations   Memory:  recent and remote  grossly intact   Judgement:   Improving   Insight:  Improving   Psychomotor Activity:  Normal- no psychomotor agitation or restlessness at this time   Concentration:  Concentration: Good and Attention Span: Good  Recall:  Good  Fund of Knowledge:  Good  Language:  Good  Akathisia:  Negative  Handed:  Right  AIMS (if indicated):     Assets:  Desire for Improvement Resilience  ADL's:  Fair   Cognition:  WNL  Sleep:  Number of Hours: 5.75   Assessment - patient presenting with improved mood and range of affect, manic symptoms resolving and currently presenting calm, behavior in good control. Tolerating Lithium and Seroquel  well - no side effects reported .  Future oriented . Marland Kitchen Treatment Plan Summary: Daily contact with patient to assess and evaluate symptoms and progress in treatment, Medication management, Plan inpatient admission  and medications as below Continue to encourage group and milieu participation to work on coping skills and symptom reduction  Continue Seroquel 25 mgrs BID and 250 mgrs QHS for Mood Disorder Continue Lithium 450 mgrs BID for mood disorder  Continue Ativan 1 mgr Q 6 hours PRN for anxiety, agitation  Lithium serum level tomorrow in AM . Treatment team working on disposition planning  Neita Garnet, MD 01/02/2016, 1:33 PM

## 2016-01-02 NOTE — Progress Notes (Signed)
Adult Psychoeducational Group Note  Date:  01/02/2016 Time:  11:23 PM  Group Topic/Focus:  Wrap-Up Group:   The focus of this group is to help patients review their daily goal of treatment and discuss progress on daily workbooks.   Participation Level:  Active  Participation Quality:  Appropriate  Affect:  Appropriate  Cognitive:  Alert, Appropriate and Oriented  Insight: Appropriate  Engagement in Group:  Engaged  Modes of Intervention:  Discussion  Additional Comments:  Patient attended wrap-up group and he said his day was a 10.  His goal for today was to maintain his medication level.  His coping skill is being around people.Sonny Dandy.   Ramesha Poster W Lexine Jaspers 01/02/2016, 11:23 PM

## 2016-01-02 NOTE — Progress Notes (Signed)
D Marc LericheMark is quiet, does not initiate conversation with staff but is cooperative and appropriate when this writer approaches him to take his morning medications. HE completed hisd aily assessment and on it he wrote  He deneid SI today and he rated his derpession, hopelessness and axneity " 0/0/0", respectively. R DC planning is dc soon with mood stabilization apparent.

## 2016-01-02 NOTE — [facility] Group Notes (Signed)
[facility] LCSW Group Therapy  01/02/2016   11:00 AM   Type of Therapy:  Group Therapy  Participation Level:  Active  Participation Quality:  Appropriate and Attentive  Affect:  Appropriate  Cognitive:  Alert and Appropriate  Insight:  Developing/Improving and Engaged  Engagement in Therapy:  Developing/Improving and Engaged  Modes of Intervention:  Clarification, Confrontation, Discussion, Education, Exploration, Limit-setting, Orientation, Problem-solving, Rapport Building, Dance movement psychotherapisteality Testing, Socialization and Support  Summary of Progress/Problems: The main focus of today's process group was to identify the patient's current support system and decide on other supports that can be put in place.  An emphasis was placed on using counselor, doctor, therapy groups, 12-step groups, and problem-specific support groups to expand supports, as well as doing something different than has been done before. Pt shared that his supports are his family and friends.  Pt shared with peers that [facility] has also been supportive for him.  Pt was quiet but appeared to actively listen to group discussion.    Jeanelle MallingChelsea Kaydense Rizo, [place] 01/02/2016 1:38 PM

## 2016-01-02 NOTE — Progress Notes (Signed)
Nutrition Note  Consult for hyperlipidemia education received. Per protocol, RN to provide pt with copy of packet outlining general healthy eating in addition to hyperlipidemia-specific information.   No further nutrition intervention warranted at this time. Please re-consult if further nutrition-related issues arise.  Anallely Rosell, MS, RD, LDN Pager: [phone] After Hours Pager: [phone]   

## 2016-01-03 DIAGNOSIS — F312 Bipolar disorder, current episode manic severe with psychotic features: Secondary | ICD-10-CM | POA: Diagnosis not present

## 2016-01-03 LAB — LITHIUM LEVEL: Lithium Lvl: 0.49 mmol/L — ABNORMAL LOW (ref 0.60–1.20)

## 2016-01-03 MED ORDER — LITHIUM CARBONATE ER 300 MG PO TBCR
600.0000 mg | EXTENDED_RELEASE_TABLET | Freq: Every evening | ORAL | Status: DC
  Administered 2016-01-03: 600 mg via ORAL
  Filled 2016-01-03 (×2): qty 2

## 2016-01-03 MED ORDER — QUETIAPINE FUMARATE 300 MG PO TABS
300.0000 mg | ORAL_TABLET | Freq: Every day | ORAL | Status: DC
  Administered 2016-01-03: 300 mg via ORAL
  Filled 2016-01-03 (×3): qty 1

## 2016-01-03 MED ORDER — HYDROXYZINE HCL 50 MG PO TABS
50.0000 mg | ORAL_TABLET | Freq: Every evening | ORAL | Status: DC | PRN
  Administered 2016-01-03: 50 mg via ORAL
  Filled 2016-01-03: qty 1

## 2016-01-03 MED ORDER — LITHIUM CARBONATE ER 450 MG PO TBCR: 450.0000 mg | EXTENDED_RELEASE_TABLET | Freq: Two times a day (BID) | ORAL | Status: DC

## 2016-01-03 MED ORDER — LITHIUM CARBONATE ER 450 MG PO TBCR
450.0000 mg | EXTENDED_RELEASE_TABLET | Freq: Every day | ORAL | Status: DC
  Administered 2016-01-04: 450 mg via ORAL
  Filled 2016-01-03 (×2): qty 1

## 2016-01-03 NOTE — Progress Notes (Signed)
D Patient up and visible in the milieu. Spoke with patient 1:1. Rates sleep as good, appetite good, energy normal and concentration good. Patient's affect appropriate, mood anxious. Rating depression at a 0/10, hopelessness at a 0/10 and anxiety at a 1/10. States goal for today is to "work towards discharge and work with staff." Denies pain, physical problems.  A: Medicated per orders, prn meds not needed or requested. Emotional support offered and self inventory reviewed.  R: Patient denies SI/HI and remains safe on level III obs.

## 2016-01-03 NOTE — Tx Team (Signed)
Interdisciplinary Treatment and Diagnostic Plan Update  01/03/2016 Time of Session: 8:45 AM [name] MRN: [id number]  Principal Diagnosis: Bipolar disorder, current episode manic severe with psychotic features [facility])  Secondary Diagnoses: Principal Problem:   Bipolar disorder, current episode manic severe with psychotic features (HCC) Active Problems:   Cocaine use disorder, mild, abuse   Current Medications:  Current Facility-Administered Medications  Medication Dose Route Frequency Provider Last Rate Last Dose  . acetaminophen (TYLENOL) tablet 650 mg  650 mg Oral Q6H PRN Jomarie Longs, MD      . alum & mag hydroxide-simeth (MAALOX/MYLANTA) 200-200-20 MG/5ML suspension 30 mL  30 mL Oral Q4H PRN Saramma Eappen, MD      . feeding supplement (ENSURE ENLIVE) (ENSURE ENLIVE) liquid 237 mL  237 mL Oral BID BM Saramma Eappen, MD   237 mL at 01/03/16 0829  . hydrOXYzine (ATARAX/VISTARIL) tablet 25 mg  25 mg Oral Q6H PRN Jomarie Longs, MD   25 mg at 01/03/16 0400  . hydrOXYzine (ATARAX/VISTARIL) tablet 50 mg  50 mg Oral QHS PRN Jomarie Longs, MD      . Melene Muller ON 01/04/2016] lithium carbonate (ESKALITH) CR tablet 450 mg  450 mg Oral Q breakfast Saramma Eappen, MD      . lithium carbonate (LITHOBID) CR tablet 600 mg  600 mg Oral QPM Saramma Eappen, MD      . LORazepam (ATIVAN) tablet 1 mg  1 mg Oral Q6H PRN Saramma Eappen, MD      . magnesium hydroxide (MILK OF MAGNESIA) suspension 30 mL  30 mL Oral Daily PRN Saramma Eappen, MD      . OLANZapine zydis (ZYPREXA) disintegrating tablet 10 mg  10 mg Oral TID PRN Jomarie Longs, MD       Or  . OLANZapine (ZYPREXA) injection 10 mg  10 mg Intramuscular TID PRN Jomarie Longs, MD      . QUEtiapine (SEROQUEL) tablet 25 mg  25 mg Oral [id number] Jomarie Longs, MD   25 mg at 01/03/16 0828  . QUEtiapine (SEROQUEL) tablet 300 mg  300 mg Oral QHS Jomarie Longs, MD       PTA Medications: Prescriptions Prior to Admission  Medication Sig Dispense  Refill Last Dose  . buPROPion (WELLBUTRIN XL) 300 MG 24 hr tablet Take 1 tablet (300 mg total) by mouth every morning. (Patient not taking: Reported on 12/29/2015) 30 tablet 0 Not Taking at Unknown time  . cyclobenzaprine (FLEXERIL) 5 MG tablet Take 1 tablet (5 mg total) by mouth 3 (three) times daily as needed for muscle spasms. (Patient not taking: Reported on 12/29/2015) 30 tablet 0 Completed Course at Unknown time  . diclofenac (CATAFLAM) 50 MG tablet Take 1 tablet (50 mg total) by mouth 3 (three) times daily. For back pain (Patient not taking: Reported on 12/29/2015) 30 tablet 0 Not Taking at Unknown time  . lithium carbonate (ESKALITH) 450 MG CR tablet Take 450 mg by mouth every 12 (twelve) hours.   12/28/2015 at Unknown time  . lithium carbonate 150 MG capsule Take 3 capsules (450 mg total) by mouth 2 (two) times daily with a meal. (Patient not taking: Reported on 12/29/2015) 180 capsule 0 Not Taking at Unknown time  . lithium carbonate 300 MG capsule Take 600 mg by mouth at bedtime.   12/28/2015 at Unknown time  . lurasidone (LATUDA) 80 MG TABS Take 1 tablet (80 mg total) by mouth daily after supper. (Patient not taking: Reported on 12/29/2015) 30 tablet 0 Not Taking at Unknown  time  . Multiple Vitamin (MULTIVITAMIN WITH MINERALS) TABS Take 1 tablet by mouth daily. (Patient not taking: Reported on 12/29/2015) 30 tablet 0 Not Taking at Unknown time  . QUEtiapine (SEROQUEL) 100 MG tablet Take 100 mg by mouth 2 (two) times daily.    Past Week at Unknown time  . QUEtiapine (SEROQUEL) 200 MG tablet Take 200 mg by mouth at bedtime.   12/27/2015 at Unknown time  . temazepam (RESTORIL) 30 MG capsule Take 30 mg by mouth at bedtime as needed for sleep.   Past Week at Unknown time  . traZODone (DESYREL) 100 MG tablet Take 1 tablet (100 mg total) by mouth at bedtime as needed for sleep. 30 tablet 0 Past Week at Unknown time    Treatment Modalities: Medication Management, Group therapy, Case management,  1 to 1  session with clinician, Psychoeducation, Recreational therapy.   Physician Treatment Plan for Primary Diagnosis: Bipolar disorder, current episode manic severe with psychotic features (HCC) Long Term Goal(s): Improvement in symptoms so as ready for discharge   Short Term Goals: Ability to disclose and discuss suicidal ideas and Compliance with prescribed medications will improve  Medication Management: Evaluate patient's response, side effects, and tolerance of medication regimen.  Therapeutic Interventions: 1 to 1 sessions, Unit Group sessions and Medication administration.  Evaluation of Outcomes: Progressing  Physician Treatment Plan for Secondary Diagnosis: Principal Problem:   Bipolar disorder, current episode manic severe with psychotic features (HCC) Active Problems:   Cocaine use disorder, mild, abuse  Long Term Goal(s): Improvement in symptoms so as ready for discharge  Short Term Goals: Ability to disclose and discuss suicidal ideas and Compliance with prescribed medications will improve  Medication Management: Evaluate patient's response, side effects, and tolerance of medication regimen.  Therapeutic Interventions: 1 to 1 sessions, Unit Group sessions and Medication administration.  Evaluation of Outcomes: Progressing   RN Treatment Plan for Primary Diagnosis: Bipolar disorder, current episode manic severe with psychotic features (HCC) Long Term Goal(s): Knowledge of disease and therapeutic regimen to maintain health will improve  Short Term Goals: Ability to disclose and discuss suicidal ideas and Compliance with prescribed medications will improve  Medication Management: RN will administer medications as ordered by provider, will assess and evaluate patient's response and provide education to patient for prescribed medication. RN will report any adverse and/or side effects to prescribing provider.  Therapeutic Interventions: 1 on 1 counseling sessions,  Psychoeducation, Medication administration, Evaluate responses to treatment, Monitor vital signs and CBGs as ordered, Perform/monitor CIWA, COWS, AIMS and Fall Risk screenings as ordered, Perform wound care treatments as ordered.  Evaluation of Outcomes: Progressing   LCSW Treatment Plan for Primary Diagnosis: Bipolar disorder, current episode manic severe with psychotic features (HCC) Long Term Goal(s): Safe transition to appropriate next level of care at discharge, Engage patient in therapeutic group addressing interpersonal concerns.  Short Term Goals: Engage patient in aftercare planning with referrals and resources, Increase social support and Increase ability to appropriately verbalize feelings  Therapeutic Interventions: Assess for all discharge needs, 1 to 1 time with Social worker, Explore available resources and support systems, Assess for adequacy in community support network, Educate family and significant other(s) on suicide prevention, Complete Psychosocial Assessment, Interpersonal group therapy.  Evaluation of Outcomes: Progressing   Progress in Treatment: Attending groups: Yes. Participating in groups: Yes. Taking medication as prescribed: Yes. Toleration medication: Yes. Family/Significant other contact made: No, will contact:  patient declined consent Patient understands diagnosis: Yes. Discussing patient identified problems/goals with staff: Yes.  Medical problems stabilized or resolved: Yes. Denies suicidal/homicidal ideation: Yes. However, admitted w SI and limited social support Issues/concerns per patient self-inventory: No. Other: none  New problem(s) identified: No, Describe:  none at this time  New Short Term/Long Term Goal(s):  Discharge Plan or Barriers:   Reason for Continuation of Hospitalization: Depression Medication stabilization Suicidal ideation  Estimated Length of Stay: 5 - 7 days  Attendees: Patient: 01/03/2016 2:00 PM  Physician: Elvera MariaS  Eappen MD 01/03/2016 2:00 PM  Nursing: Glynn Octaveoni RN, Marian F RN 01/03/2016 2:00 PM  RN Care Manager: Sondra BargesJ Clark RN CM 01/03/2016 2:00 PM  Social Worker: A Cunjningham LCSW 01/03/2016 2:00 PM  Recreational Therapist: Huston FoleyM Lindsey LRT 01/03/2016 2:00 PM  Other:  01/03/2016 2:00 PM  Other:  01/03/2016 2:00 PM  Other: 01/03/2016 2:00 PM    Scribe for Treatment Team: Sallee LangeAnne C Erienne Spelman, LCSW 01/03/2016 2:00 PM

## 2016-01-03 NOTE — Progress Notes (Signed)
Notified by Miki KinsLinsey AC that patient's gf, Beth Redner, had been in lobby to drop patient's keys off. She states patient is making threatening phone calls to her and requests staff put a "duty to warn in place." Note left for SW. Spoke with patient who states now that she has brought his keys "they are done. I'm fine now. There won't be any issues." Patient states he feels ready and prepared for discharge tomorrow.

## 2016-01-03 NOTE — Progress Notes (Addendum)
D: [name] states he has had a great day. Anticipating discharge in the am.  He states he had not been compliant with his medications and knows now how not taking his medications can affect his mental health. Denies SI/HI/AVH at this time. Contracts for safety.  A: Encouragement and support given. Q15 minute room checks for patient safety. Medications administered as prescribed.  R: Continue to monitor for patient safety and medication effectiveness.

## 2016-01-03 NOTE — Progress Notes (Signed)
Recreation Therapy Notes  Date: 01/03/16 Time: 1000 Location: 500 Hall Dayroom  Group Topic: Team Building  Goal Area(s) Addresses:  Patient will effectively work with peers in group.  Patient will verbalize benefit of team building. Patient will verbalize positive effect of healthy team building on post d/c goals.    Behavioral Response: Engaged  Intervention: Chairs, ONEOKBeach Ball  Activity: [employer] Volleyball.  LRT arranged chairs in a circle.  Patients were to sit in the circle and toss the beach ball to each other as many times as possible without the ball coming to a stop.  The ball was allowed to bounce off of the ground but it could not roll to a stop.  It the ball came to a stop, the count would start over.  Education:Team Building, Discharge Planning  Education Outcome: Acknowledges understanding/In group clarification offered/Needs additional education.   Clinical Observations/Feedback: Pt was bright and engaged.  Pt participated throughout group.    Caroll RancherMarjette Axiel Fjeld, LRT/CTRS     Caroll RancherLindsay, Abra Lingenfelter A 01/03/2016 12:22 PM

## 2016-01-03 NOTE — Plan of Care (Signed)
Problem: Education: Goal: Mental status will improve Outcome: Progressing Pt identifies how coping skills, medications will work in the management of his bipolar disorder

## 2016-01-03 NOTE — [facility] Group Notes (Signed)
[facility] LCSW Aftercare Discharge Planning Group Note   01/03/2016 5:37 PM  Participation Quality:  Appropriate  Mood/Affect:  Appropriate  Current AVH:  No  Plan for Discharge/Comments:  Return home, [facility] and [facility]  Transportation Means: not sure, ",my girlfriend is using my car", encouraged to work on transport home at discharge  Supports: family  Sallee Langenne C Flora Parks

## 2016-01-03 NOTE — [facility] Group Notes (Signed)
[facility] LCSW Group Therapy  01/03/2016 2:50 PM  Type of Therapy:  Group Therapy  Participation Level:  Minimal  Participation Quality:  Inattentive  Affect:  Appropriate    Cognitive:  Alert and Appropriate  Insight:  Improving  Engagement in Therapy:  Limited  He was listening, however colored most of the session.  Modes of Intervention:  Exploration, Rapport Building and Support  Summary of Progress/Problems:   Group today consisted of building rapport with patients, allowing time to process experience while in hospital, and reviewing hierarchy of needs in effort to feel safe and trust staff.    Loraine LericheMark was overpowered by other members of the group. He was very calm, quiet and engaged when spoken too regarding his hospital experience. He colored his picture throughout the session. He reports he feels like his medication is helping him feel more stable and the hospital admission has been helpful.  Raye SorrowCoble, Terek Bee N 01/03/2016, 2:50 PM

## 2016-01-03 NOTE — Progress Notes (Signed)
[facility] Group Notes:  (Nursing/MHT/Case Management/Adjunct)  Date:  01/03/2016  Time:  9:29 PM  Type of Therapy:  Psychoeducational Skills  Participation Level:  Active  Participation Quality:  Appropriate  Affect:  Appropriate  Cognitive:  Appropriate  Insight:  Good  Engagement in Group:  Engaged  Modes of Intervention:  Education  Summary of Progress/Problems: The patient shared with the group that he had a good day overall and that he anticipates being discharged tomorrow. In addition, he verbalized that he is feeling better and that his Lithium level has improved since being admitted to the hospital. In terms of the theme for the day, his wellness strategy will be to [facility].   [name], Mairi Stagliano S 01/03/2016, 9:29 PM

## 2016-01-03 NOTE — Progress Notes (Signed)
[name] MD Progress Note  01/03/2016 1:58 PM [name]  MRN:  [id number] Subjective: Patient states " I am ok."  Objective : I have reviewed chart notes and have met with patient . Patient continues to be pressured , manic , restless often . Pt is tolerating medications well, denies ADRs. Visible on unit, going to groups, continues to need encouragement and support.   Principal Problem: Bipolar disorder, current episode manic severe with psychotic features [facility]) Diagnosis:   Patient Active Problem List   Diagnosis Date Noted  . Bipolar disorder, current episode manic severe with psychotic features ([place]) [F31.2] 12/31/2015  . Cocaine use disorder, mild, abuse [F14.10] 12/31/2015   Total Time spent with patient: 25 minutes    Past Medical History:  Past Medical History:  Diagnosis Date  . Bipolar 1 disorder ([place])    History reviewed. No pertinent surgical history. Family History:  Family History  Problem Relation Age of Onset  . Hyperlipidemia Mother   . Alcoholism Mother   . Suicidality Cousin     Social History:  History  Alcohol Use No    Comment: occasionally     History  Drug Use No    Comment: Pt denies substance use     Social History   Social History  . Marital status: Single    Spouse name: N/A  . Number of children: N/A  . Years of education: N/A   Social History Main Topics  . Smoking status: Never Smoker  . Smokeless tobacco: Never Used  . Alcohol use No     Comment: occasionally  . Drug use: No     Comment: Pt denies substance use   . Sexual activity: Not Asked   Other Topics Concern  . None   Social History Narrative  . None   Additional Social History:   Sleep: improving   Appetite:  Improving   Current Medications: Current Facility-Administered Medications  Medication Dose Route Frequency Provider Last Rate Last Dose  . acetaminophen (TYLENOL) tablet 650 mg  650 mg Oral Q6H PRN Ursula Alert, MD      . alum & mag  hydroxide-simeth (MAALOX/MYLANTA) 200-200-20 MG/5ML suspension 30 mL  30 mL Oral Q4H PRN Daquisha Clermont, MD      . feeding supplement (ENSURE ENLIVE) (ENSURE ENLIVE) liquid 237 mL  237 mL Oral BID BM Klyn Kroening, MD   237 mL at 01/03/16 0829  . hydrOXYzine (ATARAX/VISTARIL) tablet 25 mg  25 mg Oral Q6H PRN Ursula Alert, MD   25 mg at 01/03/16 0400  . hydrOXYzine (ATARAX/VISTARIL) tablet 50 mg  50 mg Oral QHS PRN Ursula Alert, MD      . Derrill Memo ON 01/04/2016] lithium carbonate (ESKALITH) CR tablet 450 mg  450 mg Oral Q breakfast Simcha Speir, MD      . lithium carbonate (LITHOBID) CR tablet 600 mg  600 mg Oral QPM Brenya Taulbee, MD      . LORazepam (ATIVAN) tablet 1 mg  1 mg Oral Q6H PRN Thomas Rhude, MD      . magnesium hydroxide (MILK OF MAGNESIA) suspension 30 mL  30 mL Oral Daily PRN Lucero Ide, MD      . OLANZapine zydis (ZYPREXA) disintegrating tablet 10 mg  10 mg Oral TID PRN Ursula Alert, MD       Or  . OLANZapine (ZYPREXA) injection 10 mg  10 mg Intramuscular TID PRN Ursula Alert, MD      . QUEtiapine (SEROQUEL) tablet 25 mg  25  mg Oral [id number] Ursula Alert, MD   25 mg at 01/03/16 0828  . QUEtiapine (SEROQUEL) tablet 300 mg  300 mg Oral QHS Ursula Alert, MD        Lab Results:  Results for orders placed or performed during the hospital encounter of 12/30/15 (from the past 48 hour(s))  Lithium level     Status: Abnormal   Collection Time: 01/03/16  6:09 AM  Result Value Ref Range   Lithium Lvl 0.49 (L) 0.60 - 1.20 mmol/L    Comment: Performed at [facility]    Blood Alcohol level:  Lab Results  Component Value Date   ETH 9 (H) 12/29/2015   ETH <11 94/49/6759    Metabolic Disorder Labs: Lab Results  Component Value Date   HGBA1C 5.5 12/31/2015   MPG 111 12/31/2015   MPG 114 12/30/2015   Lab Results  Component Value Date   PROLACTIN 16.0 (H) 12/31/2015   PROLACTIN 5.5 12/30/2015   Lab Results  Component Value Date   CHOL 255  (H) 12/31/2015   TRIG 304 (H) 12/31/2015   HDL 50 12/31/2015   CHOLHDL 5.1 12/31/2015   VLDL 61 (H) 12/31/2015   LDLCALC 144 (H) 12/31/2015   LDLCALC 149 (H) 12/30/2015    Physical Findings: AIMS: Facial and Oral Movements Muscles of Facial Expression: None, normal Lips and Perioral Area: None, normal Jaw: None, normal Tongue: None, normal,Extremity Movements Upper (arms, wrists, hands, fingers): None, normal Lower (legs, knees, ankles, toes): None, normal, Trunk Movements Neck, shoulders, hips: None, normal, Overall Severity Severity of abnormal movements (highest score from questions above): None, normal Incapacitation due to abnormal movements: None, normal Patient's awareness of abnormal movements (rate only patient's report): No Awareness, Dental Status Current problems with teeth and/or dentures?: No Does patient usually wear dentures?: No  CIWA:  CIWA-Ar Total: 2 COWS:     Musculoskeletal: Strength & Muscle Tone: within normal limits- currently no gross tremors or diaphoresis, no psychomotor restlessness  Gait & Station: normal Patient leans: Right and N/A  Psychiatric Specialty Exam: Physical Exam  Nursing note and vitals reviewed.   Review of Systems  Psychiatric/Behavioral: The patient is nervous/anxious and has insomnia.   All other systems reviewed and are negative.  denies headache, denies  chest pain or shortness of breath at this time   Blood pressure (!) 148/85, pulse 84, temperature 99.5 F (37.5 C), temperature source Oral, resp. rate 18, height _0  (1.778 m), weight 72.6 kg (160 lb), SpO2 99 %.Body mass index is 22.96 kg/m.  General Appearance: Fairly Groomed  Eye Contact:  Good  Speech:  Pressured  Volume:  Normal  Mood:  Fair   Affect:  more reactive, still mildly anxious   Thought Process:  Tends to be over inclusive , vaguely disorganized at times   Orientation:  Full (Time, Place, and Person)  Thought Content:  denies hallucinations, no  delusions, not internally preocupied   Suicidal Thoughts:  No- he denies any suicidal or self injurious ideations at this time   Homicidal Thoughts:  No- he denies any homicidal ideations   Memory:  recent and remote grossly intact , immediate - fair  Judgement:   Improving   Insight:  Improving   Psychomotor Activity:  Normal- no psychomotor agitation or restlessness at this time   Concentration:  Concentration: Good and Attention Span: Good  Recall:  Good  Fund of Knowledge:  Good  Language:  Good  Akathisia:  Negative  Handed:  Right  AIMS (if indicated):     Assets:  Desire for Improvement Resilience  ADL's:  Fair   Cognition:  WNL  Sleep:  Number of Hours: 4.25   Assessment - Patient presenting with improved mood and range of affect,continues to have manic sx- pressured speech and restlessness at night. Will continue treatment. . Treatment Plan Summary: Daily contact with patient to assess and evaluate symptoms and progress in treatment, Medication management, Plan inpatient admission  and medications as below Continue to encourage group and milieu participation to work on coping skills and symptom reduction  Continue Seroquel 25 mgrs po BID and increase to 300 mgrs po QHS for Mood Disorder Will increase Lithium to 450 mgrs po daily and 600 mg po qpm  for mood disorder. Li level on 01/03/16-  0.49. Will repeat in 5 days. Labs reviewed - Pl 16.1 - will continue to monitor . Continue Ativan 1 mgr po Q 6 hours PRN for anxiety, agitation  Treatment team working on disposition planning .  Ovida Delagarza, MD 01/03/2016, 1:58 PM

## 2016-01-04 DIAGNOSIS — F312 Bipolar disorder, current episode manic severe with psychotic features: Secondary | ICD-10-CM | POA: Diagnosis not present

## 2016-01-04 MED ORDER — LITHIUM CARBONATE ER 450 MG PO TBCR: 450.0000 mg | EXTENDED_RELEASE_TABLET | Freq: Every day | ORAL | 0 refills | Status: DC

## 2016-01-04 MED ORDER — LITHIUM CARBONATE ER 300 MG PO TBCR: 600.0000 mg | EXTENDED_RELEASE_TABLET | Freq: Every evening | ORAL | 0 refills | Status: DC

## 2016-01-04 MED ORDER — QUETIAPINE FUMARATE 300 MG PO TABS: 300.0000 mg | ORAL_TABLET | Freq: Every day | ORAL | 0 refills | Status: DC

## 2016-01-04 MED ORDER — QUETIAPINE FUMARATE 25 MG PO TABS: 25.0000 mg | ORAL_TABLET | ORAL | 0 refills | Status: DC

## 2016-01-04 MED ORDER — HYDROXYZINE HCL 25 MG PO TABS: 25.0000 mg | ORAL_TABLET | Freq: Four times a day (QID) | ORAL | 0 refills | Status: DC | PRN

## 2016-01-04 NOTE — [facility] Suicide Risk Assessment (Signed)
[facility]   Principal Problem: Bipolar disorder, current episode manic severe with psychotic features [facility](HCC) Discharge Diagnoses:  Patient Active Problem List   Diagnosis Date Noted  . Bipolar disorder, current episode manic severe with psychotic features (HCC) [F31.2] 12/31/2015  . Cocaine use disorder, mild, abuse [F14.10] 12/31/2015    Total Time spent with patient: 30 minutes  Musculoskeletal: Strength & Muscle Tone: within normal limits Gait & Station: normal Patient leans: N/A  Psychiatric Specialty Exam: Review of Systems  Psychiatric/Behavioral: Negative for depression, hallucinations and suicidal ideas. The patient is not nervous/anxious.   All other systems reviewed and are negative.   Blood pressure (!) 156/85, pulse 83, temperature 97.5 F (36.4 C), temperature source Oral, resp. rate 18, height 5\' 10"  (1.778 m), weight 72.6 kg (160 lb), SpO2 99 %.Body mass index is 22.96 kg/m.  General Appearance: Casual  Eye Contact::  Good  Speech:  Clear and Coherent409  Volume:  Normal  Mood:  Euthymic  Affect:  Appropriate  Thought Process:  Goal Directed and Descriptions of Associations: Intact  Orientation:  Full (Time, Place, and Person)  Thought Content:  Logical  Suicidal Thoughts:  No  Homicidal Thoughts:  No  Memory:  Immediate;   Fair Recent;   Fair Remote;   Fair  Judgement:  Fair  Insight:  Fair  Psychomotor Activity:  Normal  Concentration:  Fair  Recall:  [employer]Fair  Fund of Knowledge:Fair  Language: Fair  Akathisia:  No  Handed:  Right  AIMS (if indicated):   0  Assets:  Desire for Improvement  Sleep:  Number of Hours: 4  Cognition: WNL  ADL's:  Intact   Mental Status Per Nursing Assessment::   On Admission:  NA  Demographic Factors:  Male and Caucasian  Loss Factors: NA  Historical Factors: Impulsivity  Risk Reduction Factors:   Positive social support, Positive therapeutic relationship and Positive coping skills  or problem solving skills  Continued Clinical Symptoms:  Previous Psychiatric Diagnoses and Treatments  Cognitive Features That Contribute To Risk:  None    Suicide Risk:  Minimal: No identifiable suicidal ideation.  Patients presenting with no risk factors but with morbid ruminations; may be classified as minimal risk based on the severity of the depressive symptoms  Follow-up Information    [facility]   Specialty:  Behavioral Health Why:  Please use [facility] for medications management and referral for therapy.  Hours are 8:30 - 3 Monday - Friday.  Your next appt for meds is 10/16 at 4:40 PM. You have also been referred to the Transitional Care Team for support after discharge.   Contact information: [address] [place] [address] [phone]           Plan Of Care/Follow-up recommendations:  Activity:  no restrictions Tests:  Li level on 01/08/16.  Liesel Peckenpaugh, MD 01/04/2016, 8:43 AM

## 2016-01-04 NOTE — Plan of Care (Signed)
Problem: [facility] Participation in Recreation Therapeutic Interventions Goal: STG-Patient will demonstrate improved communication skills b STG: Communication - Patient will improve communication skills, as demonstrated by ability to actively participate in at least 2 processing discussion during recreation therapy group sessions by conclusion of recreation therapy tx  Outcome: Completed/Met Date Met: 01/04/16 Pt was able to improve communication skills by actively participating in the processing of self-esteem and stress management recreation therapy sessions.  Victorino Sparrow, LRT/CTRS

## 2016-01-04 NOTE — Progress Notes (Signed)
Patient discharged to lobby. Patient was stable and appreciative at that time. All papers and prescriptions were given and valuables returned. Verbal understanding expressed. Denies SI/HI and A/VH. Patient given opportunity to express concerns and ask questions.  

## 2016-01-04 NOTE — Discharge Summary (Signed)
Physician Discharge Summary Note  Patient:  [name] is an 55 y.o., male MRN:  [id number] DOB:  May 04, 1960 Patient phone:  [phone] (home)  Patient address:   [address] [place] [address],  Total Time spent with patient: 30 minutes  Date of Admission:  12/30/2015 Date of Discharge: 01/04/2016  Reason for Admission:  Manic symptoms  Principal Problem: Bipolar disorder, current episode manic severe with psychotic features [facility](HCC) Discharge Diagnoses: Patient Active Problem List   Diagnosis Date Noted  . Bipolar disorder, current episode manic severe with psychotic features (HCC) [F31.2] 12/31/2015  . Cocaine use disorder, mild, abuse [F14.10] 12/31/2015    Past Psychiatric History: see HPI  Past Medical History:  Past Medical History:  Diagnosis Date  . Bipolar 1 disorder (HCC)    History reviewed. No pertinent surgical history. Family History:  Family History  Problem Relation Age of Onset  . Hyperlipidemia Mother   . Alcoholism Mother   . Suicidality Cousin    Family Psychiatric  History: see HPI Social History:  History  Alcohol Use No    Comment: occasionally     History  Drug Use No    Comment: Pt denies substance use     Social History   Social History  . Marital status: Single    Spouse name: N/A  . Number of children: N/A  . Years of education: N/A   Social History Main Topics  . Smoking status: Never Smoker  . Smokeless tobacco: Never Used  . Alcohol use No     Comment: occasionally  . Drug use: No     Comment: Pt denies substance use   . Sexual activity: Not Asked   Other Topics Concern  . None   Social History Narrative  . None    Hospital Course:  [name], 55 y.o  Male with hx of Bipolar disorder had been noncompliant on his medications on and off and hence decompensated. [facility] disorganized manic symptoms.  [name] was admitted for Bipolar disorder, current episode manic severe  with psychotic features [facility](HCC) and crisis management.  Patient was treated with medications with their indications listed below in detail under Medication List.  Medical problems were identified and treated as needed.  Home medications were restarted as appropriate.  Improvement was monitored by observation and [name] daily report of symptom reduction.  Emotional and mental status was monitored by daily self inventory reports completed by [name] and clinical staff.  Patient reported continued improvement, denied any new concerns.  Patient had been compliant on medications and denied side effects.  Support and encouragement was provided.          [name] was evaluated by the treatment team for stability and plans for continued recovery upon discharge.  Patient was offered further treatment options upon discharge including Residential, Intensive Outpatient and Outpatient treatment. Patient will follow up with agency listed below for medication management and counseling.  Encouraged patient to maintain satisfactory support network and home environment.  Advised to adhere to medication compliance and outpatient treatment follow up.  Prescriptions provided.  Lithium level due to be drawn in outpatient on 9/23.  Patient made aware.       [name] motivation was an integral factor for scheduling further treatment.  Employment, transportation, bed availability, health status, family support, and any pending legal issues were also considered during patient's hospital stay.  Upon completion of this admission  the patient was both mentally and medically stable for discharge denying suicidal/homicidal ideation, auditory/visual/tactile hallucinations, delusional thoughts and paranoia.      Physical Findings: AIMS: Facial and Oral Movements Muscles of Facial Expression: None, normal Lips and Perioral Area: None, normal Jaw: None, normal Tongue: None, normal,Extremity Movements Upper  (arms, wrists, hands, fingers): None, normal Lower (legs, knees, ankles, toes): None, normal, Trunk Movements Neck, shoulders, hips: None, normal, Overall Severity Severity of abnormal movements (highest score from questions above): None, normal Incapacitation due to abnormal movements: None, normal Patient's awareness of abnormal movements (rate only patient's report): No Awareness, Dental Status Current problems with teeth and/or dentures?: No Does patient usually wear dentures?: No  CIWA:  CIWA-Ar Total: 2 COWS:     Musculoskeletal: Strength & Muscle Tone: within normal limits Gait & Station: normal Patient leans: N/A  Psychiatric Specialty Exam: Physical Exam  Nursing note and vitals reviewed. Psychiatric: He has a normal mood and affect. His behavior is normal. Judgment and thought content normal. His mood appears not anxious. His speech is not slurred. He is not agitated, not aggressive and not hyperactive. Thought content is not paranoid. Cognition and memory are normal. He expresses no homicidal and no suicidal ideation.    Review of Systems  Constitutional: Negative.  Negative for fever.  HENT: Negative.   Eyes: Negative.  Negative for blurred vision.  Respiratory: Negative.  Negative for cough.   Cardiovascular: Negative.  Negative for chest pain.  Gastrointestinal: Negative.  Negative for heartburn.  Genitourinary: Negative.  Negative for dysuria.  Musculoskeletal: Negative.  Negative for myalgias.  Skin: Negative.  Negative for rash.  Neurological: Negative.  Negative for dizziness and headaches.  Endo/Heme/Allergies: Negative.  Does not bruise/bleed easily.  Psychiatric/Behavioral: Negative.  Negative for depression, hallucinations, substance abuse and suicidal ideas. The patient is not nervous/anxious and does not have insomnia.   All other systems reviewed and are negative.   Blood pressure (!) 156/85, pulse 83, temperature 97.5 F (36.4 C), temperature source  Oral, resp. rate 18, height 5\' 10"  (1.778 m), weight 72.6 kg (160 lb), SpO2 99 %.Body mass index is 22.96 kg/m.    Have you used any form of tobacco in the last 30 days? (Cigarettes, Smokeless Tobacco, Cigars, and/or Pipes): No  Has this patient used any form of tobacco in the last 30 days? (Cigarettes, Smokeless Tobacco, Cigars, and/or Pipes) Yes, NA  Blood Alcohol level:  Lab Results  Component Value Date   ETH 9 (H) 12/29/2015   ETH <11 03/29/2012    Metabolic Disorder Labs:  Lab Results  Component Value Date   HGBA1C 5.5 12/31/2015   MPG 111 12/31/2015   MPG 114 12/30/2015   Lab Results  Component Value Date   PROLACTIN 16.0 (H) 12/31/2015   PROLACTIN 5.5 12/30/2015   Lab Results  Component Value Date   CHOL 255 (H) 12/31/2015   TRIG 304 (H) 12/31/2015   HDL 50 12/31/2015   CHOLHDL 5.1 12/31/2015   VLDL 61 (H) 12/31/2015   LDLCALC 144 (H) 12/31/2015   LDLCALC 149 (H) 12/30/2015    See Psychiatric Specialty Exam and Suicide Risk Assessment completed by Attending Physician prior to discharge.  Discharge destination:  Home  Is patient on multiple antipsychotic therapies at discharge:  No   Has Patient had three or more failed trials of antipsychotic monotherapy by history:  No  Recommended Plan for Multiple Antipsychotic Therapies: NA     Medication List    STOP taking these medications  buPROPion 300 MG 24 hr tablet Commonly known as:  WELLBUTRIN XL   cyclobenzaprine 5 MG tablet Commonly known as:  FLEXERIL   diclofenac 50 MG tablet Commonly known as:  CATAFLAM   lithium carbonate 150 MG capsule Replaced by:  lithium carbonate 300 MG CR tablet You also have another medication with the same name that you need to continue taking as instructed.   lurasidone 80 MG Tabs tablet Commonly known as:  LATUDA   multivitamin with minerals Tabs tablet   temazepam 30 MG capsule Commonly known as:  RESTORIL   traZODone 100 MG tablet Commonly known as:   DESYREL     TAKE these medications     Indication  hydrOXYzine 25 MG tablet Commonly known as:  ATARAX/VISTARIL Take 1 tablet (25 mg total) by mouth every 6 (six) hours as needed for anxiety.  Indication:  Anxiety Neurosis   lithium carbonate 300 MG CR tablet Commonly known as:  LITHOBID Take 2 tablets (600 mg total) by mouth every evening. What changed:  You were already taking a medication with the same name, and this prescription was added. Make sure you understand how and when to take each. Replaces:  lithium carbonate 150 MG capsule  Indication:  mood stabilization   lithium carbonate 450 MG CR tablet Commonly known as:  ESKALITH Take 1 tablet (450 mg total) by mouth daily with breakfast. Start taking on:  01/05/2016 What changed:  when to take this  Another medication with the same name was removed. Continue taking this medication, and follow the directions you see here.  Indication:  mood stabilization   QUEtiapine 25 MG tablet Commonly known as:  SEROQUEL Take 1 tablet (25 mg total) by mouth 2 (two) times daily at 8am and 2pm. What changed:  medication strength  how much to take  when to take this  Indication:  mood stabilization   QUEtiapine 300 MG tablet Commonly known as:  SEROQUEL Take 1 tablet (300 mg total) by mouth at bedtime. What changed:  medication strength  how much to take  Indication:  moood stabilization      Follow-up Information    [facility]. Go in 1 week(s).   Specialty:  Behavioral Health Why:  [facility] use [facility] for [facility] management and referral for therapy.  Hours are 8:30 - 3 Monday - Friday.  Your next appt for meds is 10/16 at 4:40 PM. You have also been referred to the Transitional Care Team for support after discharge.   Contact information: [address] [place] [address] [phone]        LAB. Schedule an appointment as soon as possible for a visit today.   Why:  Need Lithium level drawn 9/23           Follow-up recommendations:  Activity:  as tol Diet:  as tol  Comments:  1.  Take all your medications as prescribed.   2.  Report any adverse side effects to outpatient provider. 3.  Patient instructed to not use alcohol or illegal drugs while on prescription medicines. 4.  In the event of worsening symptoms, instructed patient to call 911, the crisis hotline or go to nearest emergency room for evaluation of symptoms.  Signed: Lindwood Qua, NP [facility] 01/04/2016, 12:15 PM

## 2016-01-04 NOTE — Tx Team (Signed)
Interdisciplinary Treatment and Diagnostic Plan Update  01/04/2016 Time of Session: 8:45 AM [name] MRN: [id number]  Principal Diagnosis: Bipolar disorder, current episode manic severe with psychotic features [facility](HCC)  Secondary Diagnoses: Principal Problem:   Bipolar disorder, current episode manic severe with psychotic features (HCC) Active Problems:   Cocaine use disorder, mild, abuse   Current Medications:  Current Facility-Administered Medications  Medication Dose Route Frequency Provider Last Rate Last Dose  . acetaminophen (TYLENOL) tablet 650 mg  650 mg Oral Q6H PRN Jomarie LongsSaramma Eappen, MD      . alum & mag hydroxide-simeth (MAALOX/MYLANTA) 200-200-20 MG/5ML suspension 30 mL  30 mL Oral Q4H PRN Saramma Eappen, MD      . feeding supplement (ENSURE ENLIVE) (ENSURE ENLIVE) liquid 237 mL  237 mL Oral BID BM Saramma Eappen, MD   237 mL at 01/03/16 1453  . hydrOXYzine (ATARAX/VISTARIL) tablet 25 mg  25 mg Oral Q6H PRN Jomarie LongsSaramma Eappen, MD   25 mg at 01/03/16 0400  . hydrOXYzine (ATARAX/VISTARIL) tablet 50 mg  50 mg Oral QHS PRN Jomarie LongsSaramma Eappen, MD   50 mg at 01/03/16 2251  . lithium carbonate (ESKALITH) CR tablet 450 mg  450 mg Oral Q breakfast Jomarie LongsSaramma Eappen, MD   450 mg at 01/04/16 0827  . lithium carbonate (LITHOBID) CR tablet 600 mg  600 mg Oral QPM Saramma Eappen, MD   600 mg at 01/03/16 1704  . LORazepam (ATIVAN) tablet 1 mg  1 mg Oral Q6H PRN Saramma Eappen, MD      . magnesium hydroxide (MILK OF MAGNESIA) suspension 30 mL  30 mL Oral Daily PRN Saramma Eappen, MD      . OLANZapine zydis (ZYPREXA) disintegrating tablet 10 mg  10 mg Oral TID PRN Jomarie LongsSaramma Eappen, MD       Or  . OLANZapine (ZYPREXA) injection 10 mg  10 mg Intramuscular TID PRN Jomarie LongsSaramma Eappen, MD      . QUEtiapine (SEROQUEL) tablet 25 mg  25 mg Oral [id number] Jomarie LongsSaramma Eappen, MD   25 mg at 01/04/16 0827  . QUEtiapine (SEROQUEL) tablet 300 mg  300 mg Oral QHS Jomarie LongsSaramma Eappen, MD   300 mg at 01/03/16 2250   PTA  Medications: Prescriptions Prior to Admission  Medication Sig Dispense Refill Last Dose  . buPROPion (WELLBUTRIN XL) 300 MG 24 hr tablet Take 1 tablet (300 mg total) by mouth every morning. (Patient not taking: Reported on 12/29/2015) 30 tablet 0 Not Taking at Unknown time  . cyclobenzaprine (FLEXERIL) 5 MG tablet Take 1 tablet (5 mg total) by mouth 3 (three) times daily as needed for muscle spasms. (Patient not taking: Reported on 12/29/2015) 30 tablet 0 Completed Course at Unknown time  . diclofenac (CATAFLAM) 50 MG tablet Take 1 tablet (50 mg total) by mouth 3 (three) times daily. For back pain (Patient not taking: Reported on 12/29/2015) 30 tablet 0 Not Taking at Unknown time  . lithium carbonate (ESKALITH) 450 MG CR tablet Take 450 mg by mouth every 12 (twelve) hours.   12/28/2015 at Unknown time  . lithium carbonate 150 MG capsule Take 3 capsules (450 mg total) by mouth 2 (two) times daily with a meal. (Patient not taking: Reported on 12/29/2015) 180 capsule 0 Not Taking at Unknown time  . lithium carbonate 300 MG capsule Take 600 mg by mouth at bedtime.   12/28/2015 at Unknown time  . lurasidone (LATUDA) 80 MG TABS Take 1 tablet (80 mg total) by mouth daily after supper. (Patient not taking: Reported  on 12/29/2015) 30 tablet 0 Not Taking at Unknown time  . Multiple Vitamin (MULTIVITAMIN WITH MINERALS) TABS Take 1 tablet by mouth daily. (Patient not taking: Reported on 12/29/2015) 30 tablet 0 Not Taking at Unknown time  . QUEtiapine (SEROQUEL) 100 MG tablet Take 100 mg by mouth 2 (two) times daily.    Past Week at Unknown time  . QUEtiapine (SEROQUEL) 200 MG tablet Take 200 mg by mouth at bedtime.   12/27/2015 at Unknown time  . temazepam (RESTORIL) 30 MG capsule Take 30 mg by mouth at bedtime as needed for sleep.   Past Week at Unknown time  . traZODone (DESYREL) 100 MG tablet Take 1 tablet (100 mg total) by mouth at bedtime as needed for sleep. 30 tablet 0 Past Week at Unknown time    Treatment  Modalities: Medication Management, Group therapy, Case management,  1 to 1 session with clinician, Psychoeducation, Recreational therapy.   Physician Treatment Plan for Primary Diagnosis: Bipolar disorder, current episode manic severe with psychotic features (HCC) Long Term Goal(s): Improvement in symptoms so as ready for discharge   Short Term Goals: Ability to disclose and discuss suicidal ideas and Compliance with prescribed medications will improve  Medication Management: Evaluate patient's response, side effects, and tolerance of medication regimen.  Therapeutic Interventions: 1 to 1 sessions, Unit Group sessions and Medication administration.  Evaluation of Outcomes: Adequate for Discharge  Physician Treatment Plan for Secondary Diagnosis: Principal Problem:   Bipolar disorder, current episode manic severe with psychotic features (HCC) Active Problems:   Cocaine use disorder, mild, abuse  Long Term Goal(s): Improvement in symptoms so as ready for discharge  Short Term Goals: Ability to disclose and discuss suicidal ideas and Compliance with prescribed medications will improve  Medication Management: Evaluate patient's response, side effects, and tolerance of medication regimen.  Therapeutic Interventions: 1 to 1 sessions, Unit Group sessions and Medication administration.  Evaluation of Outcomes: Adequate for Discharge   RN Treatment Plan for Primary Diagnosis: Bipolar disorder, current episode manic severe with psychotic features (HCC) Long Term Goal(s): Knowledge of disease and therapeutic regimen to maintain health will improve  Short Term Goals: Ability to disclose and discuss suicidal ideas and Compliance with prescribed medications will improve  Medication Management: RN will administer medications as ordered by provider, will assess and evaluate patient's response and provide education to patient for prescribed medication. RN will report any adverse and/or side  effects to prescribing provider.  Therapeutic Interventions: 1 on 1 counseling sessions, Psychoeducation, Medication administration, Evaluate responses to treatment, Monitor vital signs and CBGs as ordered, Perform/monitor CIWA, COWS, AIMS and Fall Risk screenings as ordered, Perform wound care treatments as ordered.  Evaluation of Outcomes: Adequate for Discharge   LCSW Treatment Plan for Primary Diagnosis: Bipolar disorder, current episode manic severe with psychotic features (HCC) Long Term Goal(s): Safe transition to appropriate next level of care at discharge, Engage patient in therapeutic group addressing interpersonal concerns.  Short Term Goals: Engage patient in aftercare planning with referrals and resources, Increase social support and Increase ability to appropriately verbalize feelings  Therapeutic Interventions: Assess for all discharge needs, 1 to 1 time with Social worker, Explore available resources and support systems, Assess for adequacy in community support network, Educate family and significant other(s) on suicide prevention, Complete Psychosocial Assessment, Interpersonal group therapy.  Evaluation of Outcomes: Adequate for Discharge   Progress in Treatment: Attending groups: Yes. Participating in groups: Yes. Taking medication as prescribed: Yes. Toleration medication: Yes. Family/Significant other contact made: No,  will contact:  patient declined consent Patient understands diagnosis: Yes. Discussing patient identified problems/goals with staff: Yes. Medical problems stabilized or resolved: Yes. Denies suicidal/homicidal ideation: Yes. However, admitted w SI and limited social support Issues/concerns per patient self-inventory: No. Other: none  New problem(s) identified: No, Describe:  none at this time  New Short Term/Long Term Goal(s):  Discharge Plan or Barriers:   Reason for Continuation of Hospitalization: Depression Medication  stabilization Suicidal ideation  Estimated Length of Stay: D/C today  Attendees: Patient: 01/04/2016 9:24 AM  Physician: Elvera Maria MD 01/04/2016 9:24 AM  Nursing: Glynn Octave RN 01/04/2016 9:24 AM  RN Care Manager: Sondra Barges RN CM 01/04/2016 9:24 AM  Social Worker: A Cunjningham LCSW 01/04/2016 9:24 AM  Recreational Therapist: Huston Foley LRT 01/04/2016 9:24 AM  Other:  01/04/2016 9:24 AM  Other:  01/04/2016 9:24 AM  Other: 01/04/2016 9:24 AM    Scribe for Treatment Team: Ida Rogue, LCSW 01/04/2016 9:24 AM

## 2016-01-04 NOTE — Progress Notes (Signed)
Recreation Therapy Notes  Date: 01/04/16 Time: 1000 Location: 500 Hall Dayroom  Group Topic: Self-Esteem  Goal Area(s) Addresses:  Patient will identify positive ways to increase self-esteem. Patient will verbalize benefit of increased self-esteem.  Behavioral Response: Engaged  Intervention: Theme park managerColored pencils, Holiday representativeconstruction paper  Activity: Personalized License Plate.  LRT gave each person a piece of construction paper.  Patients were to use colored pencils to draw their license plate and highlight the things that are important to them and what makes them unique.  Education:  Self-Esteem, Building control surveyorDischarge Planning.   Education Outcome: Acknowledges education/In group clarification offered/Needs additional education  Clinical Observations/Feedback: Pt stated that self-esteem was how you feel about yourself.  Pt included on his license plate his sons' birthdays, a guitar and the dates he [facility] to [employer].  Pt stated all these things have meaning in his life and remind him of what's important to him.   Marc RancherMarjette Duane [name], LRT/CTRS     Lillia AbedLindsay, Chalsea Darko A 01/04/2016 11:08 AM

## 2016-01-04 NOTE — Progress Notes (Signed)
  [facility] Dis[facility] :  Will you be returning to the same living situation after discharge:  Yes,  home At discharge, do you have transportation home?: Yes,  friend Do you have the ability to pay for your medications: Yes,  insurnace  Release of information consent forms completed and in the chart;  Patient's signature needed at discharge.  Patient to Follow up at: Follow-up Information    [facility]. Go in 1 week(s).   Specialty:  Behavioral Health Why:  [facility] use [facility] for medications management and referral for therapy.  Hours are 8:30 - 3 Monday - Friday.  Your next appt for meds is 10/16 at 4:40 PM. You have also been referred to the Transitional Care Team for support after discharge.   Contact information: [address] [place] [address] [phone]           Next level of care provider has [facility] Link:no  Safety Planning and Suicide Prevention discussed: Yes,  yes  Have you used any form of tobacco in the last 30 days? (Cigarettes, Smokeless Tobacco, Cigars, and/or Pipes): No  Has patient been referred to the Quitline?: N/A patient is not a smoker  Patient has been referred for addiction treatment: Pt. refused referral  Marc RogueRodney B Gotham [name] 01/04/2016, 9:29 AM

## 2016-01-08 ENCOUNTER — Encounter: Payer: Self-pay | Admitting: Urgent Care

## 2016-01-08 ENCOUNTER — Emergency Department
Admission: EM | Admit: 2016-01-08 | Discharge: 2016-01-08 | Disposition: A | Discharge status: Home / Self Care | Payer: BLUE CROSS/BLUE SHIELD | Attending: Emergency Medicine | Admitting: Emergency Medicine

## 2016-01-08 DIAGNOSIS — F3162 Bipolar disorder, current episode mixed, moderate: Secondary | ICD-10-CM | POA: Insufficient documentation

## 2016-01-08 DIAGNOSIS — F3160 Bipolar disorder, current episode mixed, unspecified: Secondary | ICD-10-CM

## 2016-01-08 DIAGNOSIS — Z5181 Encounter for therapeutic drug level monitoring: Secondary | ICD-10-CM | POA: Insufficient documentation

## 2016-01-08 LAB — COMPREHENSIVE METABOLIC PANEL
ALK PHOS: 80 U/L (ref 38–126)
ALT: 37 U/L (ref 17–63)
ANION GAP: 8 (ref 5–15)
AST: 62 U/L — ABNORMAL HIGH (ref 15–41)
Albumin: 4.6 g/dL (ref 3.5–5.0)
BILIRUBIN TOTAL: 1 mg/dL (ref 0.3–1.2)
BUN: 15 mg/dL (ref 6–20)
CALCIUM: 9.2 mg/dL (ref 8.9–10.3)
CO2: 24 mmol/L (ref 22–32)
Chloride: 108 mmol/L (ref 101–111)
Creatinine, Ser: 1.13 mg/dL (ref 0.61–1.24)
GFR calc Af Amer: >60 mL/min (ref >60)
Glucose, Bld: 109 mg/dL — ABNORMAL HIGH (ref 65–99)
POTASSIUM: 3.2 mmol/L — AB (ref 3.5–5.1)
Sodium: 140 mmol/L (ref 135–145)
TOTAL PROTEIN: 8 g/dL (ref 6.5–8.1)

## 2016-01-08 LAB — URINE DRUG SCREEN, QUALITATIVE (ARMC ONLY)
Amphetamines, Ur Screen: NOT DETECTED
BARBITURATES, UR SCREEN: NOT DETECTED
BENZODIAZEPINE, UR SCRN: NOT DETECTED
CANNABINOID 50 NG, UR ~~LOC~~: NOT DETECTED
Cocaine Metabolite,Ur ~~LOC~~: NOT DETECTED
MDMA (Ecstasy)Ur Screen: NOT DETECTED
Methadone Scn, Ur: NOT DETECTED
OPIATE, UR SCREEN: NOT DETECTED
PHENCYCLIDINE (PCP) UR S: NOT DETECTED
Tricyclic, Ur Screen: NOT DETECTED

## 2016-01-08 LAB — CBC
HCT: 36 % — ABNORMAL LOW (ref 40.0–52.0)
Hemoglobin: 12.8 g/dL — ABNORMAL LOW (ref 13.0–18.0)
MCH: 32.2 pg (ref 26.0–34.0)
MCHC: 35.7 g/dL (ref 32.0–36.0)
MCV: 90.2 fL (ref 80.0–100.0)
PLATELETS: 306 10*3/uL (ref 150–440)
RBC: 3.99 MIL/uL — ABNORMAL LOW (ref 4.40–5.90)
RDW: 12.9 % (ref 11.5–14.5)
WBC: 9.1 10*3/uL (ref 3.8–10.6)

## 2016-01-08 LAB — SALICYLATE LEVEL: Salicylate Lvl: <4 mg/dL (ref 2.8–30.0)

## 2016-01-08 LAB — ETHANOL: ALCOHOL ETHYL (B): 72 mg/dL — AB (ref <5)

## 2016-01-08 LAB — ACETAMINOPHEN LEVEL

## 2016-01-08 LAB — LITHIUM LEVEL

## 2016-01-08 MED ORDER — POTASSIUM CHLORIDE CRYS ER 20 MEQ PO TBCR
40.0000 meq | EXTENDED_RELEASE_TABLET | Freq: Once | ORAL | Status: AC
  Administered 2016-01-08: 40 meq via ORAL
  Filled 2016-01-08: qty 2

## 2016-01-08 NOTE — ED Notes (Signed)
Patient received supper tray. He called his girlfriend again for a ride home. States she is on the way.  Patient in no apparent distress. Calm and cooperative with all nursing interventions.Maintained on 15 minute checks and observation by security camera for safety.

## 2016-01-08 NOTE — ED Notes (Signed)
Patient received breakfast tray 

## 2016-01-08 NOTE — ED Notes (Signed)
Patient received lunch tray 

## 2016-01-08 NOTE — ED Notes (Signed)
Spoke to girlfriend who states she will not want to pick up the patient from the ED. She gave RN another friend to call [facility]- [place] - [phone] - who said he would be able to pick up the patient later this evening. This information was conveyed to patient.

## 2016-01-08 NOTE — BH Assessment (Signed)
Assessor spoke with Iris, RN who reports the TTS screen will not turn on and she is attempting to fix it. Currently on the phone with Iris, RN while she attempts to turn on the TTS machine.  Princess BruinsAquicha Duff, MSW, Theresia MajorsLCSWA

## 2016-01-08 NOTE — ED Notes (Signed)
Patient resting quietly in room. No noted distress or abnormal behaviors noted. Will continue 15 minute checks and observation by security camera for safety. 

## 2016-01-08 NOTE — ED Notes (Signed)
Disposition pending. Patient resting quietly in room. No noted distress or abnormal behaviors noted. Will continue 15 minute checks and observation by security camera for safety.
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
## 2016-01-08 NOTE — ED Provider Notes (Signed)
Patient has been cleared by Tele-psychiatry for discharge.   Marc FilbertJonathan E Brenton Joines, MD 01/08/16 1534

## 2016-01-08 NOTE — ED Notes (Signed)
Patient in common area watching television. He requested and received a snack.Maintained on 15 minute checks and observation by security camera for safety.

## 2016-01-08 NOTE — ED Notes (Signed)
Report was received from Arnette SchaumannKinesha H., RN; Pt. Verbalizes no complaints or distress; denies S.I./Hi. Continue to monitor with 15 min. Monitoring.

## 2016-01-08 NOTE — ED Notes (Signed)
Patient to be discharged home. He called his girlfriend who will be here soon. Discharge instructions reviewed with patient, he verbalizes understanding. Patient denies SI or HI.

## 2016-01-08 NOTE — ED Provider Notes (Signed)
[facility] Emergency Department Provider Note   ____________________________________________   None    (approximate)  I have reviewed the triage vital signs and the nursing notes.   HISTORY  Chief Complaint Psychiatric Evaluation    HPI RAFORD BRISSETT is a 55 y.o. male brought to the ED via police requesting behavioral health evaluation. Patient has a history of bipolar disorder, states he is supposed to go to [place] next week but "just can't wait".Reports being out of his medications for the past 5 days. Denies active SI/HI/AH/VH. Voices no medical complaints. Denies recent fever, chills, chest pain, shortness of breath, abdominal pain, nausea, vomiting, diarrhea. Denies recent travel or trauma. Nothing makes his symptoms better or worse.   Past Medical History:  Diagnosis Date  . Bipolar 1 disorder [facility])     Patient Active Problem List   Diagnosis Date Noted  . Bipolar disorder, current episode manic severe with psychotic features (HCC) 12/31/2015  . Cocaine use disorder, mild, abuse 12/31/2015    History reviewed. No pertinent surgical history.  Prior to Admission medications   Medication Sig Start Date End Date Taking? Authorizing Provider  hydrOXYzine (ATARAX/VISTARIL) 25 MG tablet Take 1 tablet (25 mg total) by mouth every 6 (six) hours as needed for anxiety. 01/04/16   Adonis Brook, NP  lithium carbonate (ESKALITH) 450 MG CR tablet Take 1 tablet (450 mg total) by mouth daily with breakfast. 01/05/16   Adonis Brook, NP  lithium carbonate (LITHOBID) 300 MG CR tablet Take 2 tablets (600 mg total) by mouth every evening. 01/04/16   Adonis Brook, NP  QUEtiapine (SEROQUEL) 25 MG tablet Take 1 tablet (25 mg total) by mouth 2 (two) times daily at 8am and 2pm. 01/04/16   Adonis Brook, NP  QUEtiapine (SEROQUEL) 300 MG tablet Take 1 tablet (300 mg total) by mouth at bedtime. 01/04/16   Adonis Brook, NP    Allergies Medrol  [methylprednisolone]  Family History  Problem Relation Age of Onset  . Hyperlipidemia Mother   . Alcoholism Mother   . Suicidality Cousin     Social History Social History  Substance Use Topics  . Smoking status: Never Smoker  . Smokeless tobacco: Never Used  . Alcohol use 0.0 oz/week     Comment: occasionally    Review of Systems  Constitutional: No fever/chills. Eyes: No visual changes. ENT: No sore throat. Cardiovascular: Denies chest pain. Respiratory: Denies shortness of breath. Gastrointestinal: No abdominal pain.  No nausea, no vomiting.  No diarrhea.  No constipation. Genitourinary: Negative for dysuria. Musculoskeletal: Negative for back pain. Skin: Negative for rash. Neurological: Negative for headaches, focal weakness or numbness. Psychiatric: Positive for bipolar disorder.  10-point ROS otherwise negative.  ____________________________________________   PHYSICAL EXAM:  VITAL SIGNS: ED Triage Vitals  Enc Vitals Group     BP 01/08/16 0014 (!) 155/78     Pulse Rate 01/08/16 0014 (!) 58     Resp 01/08/16 0014 18     Temp 01/08/16 0014 97.7 F (36.5 C)     Temp Source 01/08/16 0014 Oral     SpO2 01/08/16 0014 98 %     Weight 01/08/16 0015 150 lb (68 kg)     Height 01/08/16 0015 5\' 10"  (1.778 m)     Head Circumference --      Peak Flow --      Pain Score 01/08/16 0016 5     Pain Loc --      Pain Edu? --  Excl. in GC? --     Constitutional: Alert and oriented. Well appearing and in no acute distress. Eyes: Conjunctivae are normal. PERRL. EOMI. Head: Atraumatic. Nose: No congestion/rhinnorhea. Mouth/Throat: Mucous membranes are moist.  Oropharynx non-erythematous. Neck: No stridor.   Cardiovascular: Normal rate, regular rhythm. Grossly normal heart sounds.  Good peripheral circulation. Respiratory: Normal respiratory effort.  No retractions. Lungs CTAB. Gastrointestinal: Soft and nontender. No distention. No abdominal bruits. No CVA  tenderness. Musculoskeletal: No lower extremity tenderness nor edema.  No joint effusions. Neurologic:  Normal speech and language. No gross focal neurologic deficits are appreciated. No gait instability. Skin:  Skin is warm, dry and intact. No rash noted. Psychiatric: Mood and affect are flat. Speech and behavior are normal.  ____________________________________________   LABS (all labs ordered are listed, but only abnormal results are displayed)  Labs Reviewed  COMPREHENSIVE METABOLIC PANEL - Abnormal; Notable for the following:       Result Value   Potassium 3.2 (*)    Glucose, Bld 109 (*)    AST 62 (*)    All other components within normal limits  ETHANOL - Abnormal; Notable for the following:    Alcohol, Ethyl (B) 72 (*)    All other components within normal limits  ACETAMINOPHEN LEVEL - Abnormal; Notable for the following:    Acetaminophen (Tylenol), Serum <10 (*)    All other components within normal limits  CBC - Abnormal; Notable for the following:    RBC 3.99 (*)    Hemoglobin 12.8 (*)    HCT 36.0 (*)    All other components within normal limits  LITHIUM LEVEL - Abnormal; Notable for the following:    Lithium Lvl <0.06 (*)    All other components within normal limits  SALICYLATE LEVEL  URINE DRUG SCREEN, QUALITATIVE ([facility] ONLY)   ____________________________________________  EKG  None ____________________________________________  RADIOLOGY  None ____________________________________________   PROCEDURES  Procedure(s) performed: None  Procedures  Critical Care performed: No  ____________________________________________   INITIAL IMPRESSION / ASSESSMENT AND PLAN / ED COURSE  Pertinent labs & imaging results that were available during my care of the patient were reviewed by me and considered in my medical decision making (see chart for details).  55 year old male with a history of bipolar disorder out of medications for the past 5 days; desiring  behavioral medicine evaluation. Will replete potassium orally, consult TTS and psychiatry to evaluate patient in the emergency department.  Clinical Course  Comment By Time  No events overnight. Patient remains in the emergency department under voluntary status pending psychiatry consult. Irean HongJade J Sung, MD 09/23 [phone]     ____________________________________________   FINAL CLINICAL IMPRESSION(S) / ED DIAGNOSES  Final diagnoses:  Bipolar disorder, current episode mixed, moderate (HCC)      NEW MEDICATIONS STARTED DURING THIS VISIT:  New Prescriptions   No medications on file     Note:  This document was prepared using Dragon voice recognition software and may include unintentional dictation errors.    Irean HongJade J Sung, MD 01/08/16 [phone]

## 2016-01-08 NOTE — ED Notes (Signed)
Called quad nurse for a bed; none available at this time. Patient is not suicidal or homicidal at this time; will place patient in the subwait area to await bed. ED charge nurse made aware.

## 2016-01-08 NOTE — ED Notes (Signed)

## 2016-01-08 NOTE — ED Notes (Signed)
[facility] No complaints. Maintained on 15 minute checks.

## 2016-01-08 NOTE — ED Notes (Addendum)
FIRST NURSE NOTE: [facility] [place] Patient reporting that he needs "some help". Patient with PMH significant for Bipolar - reports that [facility] is [facility], but "just cant wait". Patient reports that he is on Seroquel, Lithium, and Trazodone - has been out of these medications for over 5 days.  Denies SI/HI and AH/VH.

## 2016-01-08 NOTE — ED Notes (Signed)
Patient awake, alert, and oriented. He denies SI or HI. Mood is euthymic. Speech is clear and coherent, thoughts are organized. Patient reports when he was [facility], he was given a prescription for his [facility], instead he came to the ED stating " I need my medications." Patient seems confused about why he is here, states his girlfriend can pick him up and he can go to the pharmacy.  Awaiting TTS and psych consult. Maintained on 15 minute checks for safety.

## 2016-01-09 ENCOUNTER — Encounter (HOSPITAL_COMMUNITY): Payer: Self-pay | Admitting: *Deleted

## 2016-01-09 ENCOUNTER — Emergency Department (HOSPITAL_COMMUNITY)
Admission: EM | Admit: 2016-01-09 | Discharge: 2016-01-09 | Disposition: A | Discharge status: Home / Self Care | Payer: BLUE CROSS/BLUE SHIELD | Attending: Emergency Medicine | Admitting: Emergency Medicine

## 2016-01-09 DIAGNOSIS — Z760 Encounter for issue of repeat prescription: Secondary | ICD-10-CM

## 2016-01-09 MED ORDER — QUETIAPINE FUMARATE 300 MG PO TABS: 300.0000 mg | ORAL_TABLET | Freq: Every day | ORAL | 0 refills | Status: DC

## 2016-01-09 MED ORDER — HYDROXYZINE HCL 25 MG PO TABS: 25.0000 mg | ORAL_TABLET | Freq: Four times a day (QID) | ORAL | 0 refills | Status: DC | PRN

## 2016-01-09 MED ORDER — LITHIUM CARBONATE ER 450 MG PO TBCR: 450.0000 mg | EXTENDED_RELEASE_TABLET | Freq: Every day | ORAL | 0 refills | Status: DC

## 2016-01-09 MED ORDER — QUETIAPINE FUMARATE 25 MG PO TABS: 25.0000 mg | ORAL_TABLET | ORAL | 0 refills | Status: DC
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
## 2016-01-09 NOTE — ED Notes (Signed)
Patient verbalized understanding of discharge instructions and denies any further needs or questions at this time. VS stable. Patient ambulatory with steady gait. RN escorted to ED entrance.   
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
## 2016-01-11 ENCOUNTER — Encounter (HOSPITAL_COMMUNITY): Payer: Self-pay | Admitting: Emergency Medicine

## 2016-01-11 ENCOUNTER — Emergency Department (HOSPITAL_COMMUNITY)
Admission: EM | Admit: 2016-01-11 | Discharge: 2016-01-11 | Disposition: A | Discharge status: Home / Self Care | Payer: BLUE CROSS/BLUE SHIELD | Attending: Emergency Medicine | Admitting: Emergency Medicine

## 2016-01-11 DIAGNOSIS — Z79899 Other long term (current) drug therapy: Secondary | ICD-10-CM | POA: Insufficient documentation

## 2016-01-11 DIAGNOSIS — M25562 Pain in left knee: Secondary | ICD-10-CM | POA: Insufficient documentation

## 2016-01-11 DIAGNOSIS — G8929 Other chronic pain: Secondary | ICD-10-CM | POA: Insufficient documentation

## 2016-01-11 MED ORDER — IBUPROFEN 800 MG PO TABS: 800.0000 mg | ORAL_TABLET | Freq: Three times a day (TID) | ORAL | 0 refills | Status: DC | PRN

## 2016-01-11 MED ORDER — TRAMADOL HCL 50 MG PO TABS
50.0000 mg | ORAL_TABLET | Freq: Once | ORAL | Status: AC
  Administered 2016-01-11: 50 mg via ORAL
  Filled 2016-01-11: qty 1

## 2016-01-11 MED ORDER — IBUPROFEN 800 MG PO TABS
800.0000 mg | ORAL_TABLET | Freq: Once | ORAL | Status: AC
  Administered 2016-01-11: 800 mg via ORAL
  Filled 2016-01-11: qty 1

## 2016-01-11 NOTE — Discharge Instructions (Signed)
Return here as needed. Follow up with [facility]. °

## 2016-01-11 NOTE — ED Triage Notes (Signed)
Pt states he is manic and [facility] but [facility] are full so the police brought him here instead  Pt states he needs to go to the walk up clinic and get his lithium  Pt states he is currently homeless  Pt is also c/o left knee pain

## 2016-01-12 NOTE — ED Provider Notes (Signed)
[facility]-EMERGENCY DEPT Provider Note   CSN: [id number] Arrival date & time: 01/11/16  0543     History   Chief Complaint Chief Complaint  Patient presents with  . Manic Behavior    HPI [name] is a 55 y.o. male.  HPI Patient presents to the emergency department with left knee pain from doing a lot of walking.  The patient states that he needs to get a [facility] to obtain his medications, but he is here for evaluation of his left knee that causes chronic pain, but is worse since he has been doing increased walking.  The patient states that movement and palpation make the pain worse.  He has not taken anything for his symptoms.  He states he would like to have something for the discomfort.  He states that he does not feel he needs an x-ray as it is an overuse injury of a chronic condition. The patient denies chest pain, shortness of breath, headache,blurred vision, neck pain, fever, cough, weakness, numbness, dizziness, anorexia, edema, abdominal pain, nausea, vomiting, diarrhea, rash, back pain, dysuria, hematemesis, bloody stool, near syncope, or syncope. Past Medical History:  Diagnosis Date  . Bipolar 1 disorder [facility](HCC)     Patient Active Problem List   Diagnosis Date Noted  . Bipolar disorder, current episode manic severe with psychotic features (HCC) 12/31/2015  . Cocaine use disorder, mild, abuse 12/31/2015    Past Surgical History:  Procedure Laterality Date  . EYE SURGERY         Home Medications    Prior to Admission medications   Medication Sig Start Date End Date Taking? Authorizing Provider  hydrOXYzine (ATARAX/VISTARIL) 25 MG tablet Take 1 tablet (25 mg total) by mouth every 6 (six) hours as needed for anxiety. 01/09/16   Roxy Horsemanobert Browning, PA-C  ibuprofen (ADVIL,MOTRIN) 800 MG tablet Take 1 tablet (800 mg total) by mouth every 8 (eight) hours as needed. 01/11/16   Charlestine Nighthristopher Derreck Wiltsey, PA-C  lithium carbonate (ESKALITH) 450 MG CR tablet Take 1 tablet (450 mg  total) by mouth daily with breakfast. 01/09/16   Roxy Horsemanobert Browning, PA-C  QUEtiapine (SEROQUEL) 25 MG tablet Take 1 tablet (25 mg total) by mouth 2 (two) times daily at 8am and 2pm. 01/09/16   Roxy Horsemanobert Browning, PA-C  QUEtiapine (SEROQUEL) 300 MG tablet Take 1 tablet (300 mg total) by mouth at bedtime. 01/09/16   Roxy Horsemanobert Browning, PA-C    Family History Family History  Problem Relation Age of Onset  . Hyperlipidemia Mother   . Alcoholism Mother   . Suicidality Cousin     Social History Social History  Substance Use Topics  . Smoking status: Never Smoker  . Smokeless tobacco: Never Used  . Alcohol use 0.0 oz/week     Comment: occasionally     Allergies   Medrol [methylprednisolone]   Review of Systems Review of Systems  All other systems negative except as documented in the HPI. All pertinent positives and negatives as reviewed in the HPI. Physical Exam Updated Vital Signs BP 146/79 (BP Location: Left Arm)   Pulse 75   Temp 98.1 F (36.7 C) (Oral)   Resp 18   SpO2 100%   Physical Exam  Constitutional: He is oriented to person, place, and time. He appears well-developed and well-nourished. No distress.  HENT:  Head: Normocephalic and atraumatic.  Eyes: EOM are normal. Pupils are equal, round, and reactive to light.  Pulmonary/Chest: Effort normal.  Musculoskeletal:       Left knee: He exhibits  normal range of motion, no swelling, no effusion, no ecchymosis, no deformity, no LCL laxity, no bony tenderness, normal meniscus and no MCL laxity. Tenderness found. Medial joint line tenderness noted. No patellar tendon tenderness noted.  Neurological: He is alert and oriented to person, place, and time.  Skin: Skin is warm and dry.     ED Treatments / Results  Labs (all labs ordered are listed, but only abnormal results are displayed) Labs Reviewed - No data to display  EKG  EKG Interpretation None       Radiology No results found.  Procedures Procedures (including  critical care time)  Medications Ordered in ED Medications  traMADol (ULTRAM) tablet 50 mg (50 mg Oral Given 01/11/16 0934)  ibuprofen (ADVIL,MOTRIN) tablet 800 mg (800 mg Oral Given 01/11/16 0934)     Initial Impression / Assessment and Plan / ED Course  I have reviewed the triage vital signs and the nursing notes.  Pertinent labs & imaging results that were available during my care of the patient were reviewed by me and considered in my medical decision making (see chart for details).  Clinical Course    Advised patient to follow up at [facility] for his psych medications of evening treatment for irritation of his knee.  The patient agrees the plan and all questions were answered Final Clinical Impressions(s) / ED Diagnoses   Final diagnoses:  Left knee pain    New Prescriptions Discharge Medication List as of 01/11/2016  9:05 AM    START taking these medications   Details  ibuprofen (ADVIL,MOTRIN) 800 MG tablet Take 1 tablet (800 mg total) by mouth every 8 (eight) hours as needed., Starting Tue 01/11/2016, Print         Charlestine Night, PA-C 01/12/16 1644    Linwood Dibbles, MD 01/14/16 [phone]

## 2016-01-15 ENCOUNTER — Encounter (HOSPITAL_COMMUNITY): Payer: Self-pay | Admitting: *Deleted

## 2016-01-15 ENCOUNTER — Emergency Department (HOSPITAL_COMMUNITY)
Admission: EM | Admit: 2016-01-15 | Discharge: 2016-01-16 | Disposition: A | Discharge status: Home / Self Care | Payer: Federal, State, Local not specified - Other | Attending: Emergency Medicine | Admitting: Emergency Medicine

## 2016-01-15 DIAGNOSIS — F309 Manic episode, unspecified: Secondary | ICD-10-CM | POA: Insufficient documentation

## 2016-01-15 DIAGNOSIS — F3010 Manic episode without psychotic symptoms, unspecified: Secondary | ICD-10-CM

## 2016-01-15 DIAGNOSIS — Z79899 Other long term (current) drug therapy: Secondary | ICD-10-CM | POA: Insufficient documentation

## 2016-01-15 DIAGNOSIS — F3170 Bipolar disorder, currently in remission, most recent episode unspecified: Secondary | ICD-10-CM | POA: Diagnosis present

## 2016-01-15 DIAGNOSIS — Z760 Encounter for issue of repeat prescription: Secondary | ICD-10-CM | POA: Insufficient documentation

## 2016-01-15 LAB — CBC
HCT: 32.3 % — ABNORMAL LOW (ref 39.0–52.0)
HEMOGLOBIN: 11.1 g/dL — AB (ref 13.0–17.0)
MCH: 30.7 pg (ref 26.0–34.0)
MCHC: 34.4 g/dL (ref 30.0–36.0)
MCV: 89.5 fL (ref 78.0–100.0)
PLATELETS: 236 10*3/uL (ref 150–400)
RBC: 3.61 MIL/uL — AB (ref 4.22–5.81)
RDW: 12.6 % (ref 11.5–15.5)
WBC: 7.8 10*3/uL (ref 4.0–10.5)

## 2016-01-15 LAB — COMPREHENSIVE METABOLIC PANEL
ALK PHOS: 73 U/L (ref 38–126)
ALT: 25 U/L (ref 17–63)
ANION GAP: 5 (ref 5–15)
AST: 33 U/L (ref 15–41)
Albumin: 3.7 g/dL (ref 3.5–5.0)
BUN: 12 mg/dL (ref 6–20)
CALCIUM: 9.1 mg/dL (ref 8.9–10.3)
CHLORIDE: 108 mmol/L (ref 101–111)
CO2: 24 mmol/L (ref 22–32)
CREATININE: 1.17 mg/dL (ref 0.61–1.24)
Glucose, Bld: 147 mg/dL — ABNORMAL HIGH (ref 65–99)
Potassium: 3.4 mmol/L — ABNORMAL LOW (ref 3.5–5.1)
SODIUM: 137 mmol/L (ref 135–145)
Total Bilirubin: 0.7 mg/dL (ref 0.3–1.2)
Total Protein: 6.6 g/dL (ref 6.5–8.1)

## 2016-01-15 LAB — RAPID URINE DRUG SCREEN, HOSP PERFORMED
Amphetamines: NOT DETECTED
Barbiturates: NOT DETECTED
Benzodiazepines: NOT DETECTED
Cocaine: NOT DETECTED
Opiates: NOT DETECTED
Tetrahydrocannabinol: NOT DETECTED

## 2016-01-15 LAB — LITHIUM LEVEL: LITHIUM LVL: 1.16 mmol/L (ref 0.60–1.20)

## 2016-01-15 LAB — ETHANOL

## 2016-01-15 MED ORDER — ONDANSETRON HCL 4 MG PO TABS: 4.0000 mg | ORAL_TABLET | Freq: Three times a day (TID) | ORAL | Status: DC | PRN

## 2016-01-15 MED ORDER — HYDROXYZINE HCL 25 MG PO TABS: 25.0000 mg | ORAL_TABLET | Freq: Four times a day (QID) | ORAL | Status: DC | PRN

## 2016-01-15 MED ORDER — IBUPROFEN 200 MG PO TABS: 600.0000 mg | ORAL_TABLET | Freq: Three times a day (TID) | ORAL | Status: DC | PRN

## 2016-01-15 MED ORDER — LITHIUM CARBONATE ER 450 MG PO TBCR
450.0000 mg | EXTENDED_RELEASE_TABLET | Freq: Two times a day (BID) | ORAL | Status: DC
  Administered 2016-01-15 – 2016-01-16 (×2): 450 mg via ORAL
  Filled 2016-01-15 (×2): qty 1

## 2016-01-15 MED ORDER — QUETIAPINE FUMARATE ER 300 MG PO TB24
300.0000 mg | ORAL_TABLET | Freq: Every day | ORAL | Status: DC
  Administered 2016-01-15: 300 mg via ORAL
  Filled 2016-01-15: qty 1

## 2016-01-15 MED ORDER — QUETIAPINE FUMARATE 25 MG PO TABS
25.0000 mg | ORAL_TABLET | Freq: Two times a day (BID) | ORAL | Status: DC
  Administered 2016-01-15 – 2016-01-16 (×2): 25 mg via ORAL
  Filled 2016-01-15 (×2): qty 1

## 2016-01-15 NOTE — BH Assessment (Signed)
[facility] Assessment Progress Note  Patient will be moved to the SAPU for observation overnight and re-eval in the morning. [facility] Has been living in his car since his discharge.
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
## 2016-01-15 NOTE — ED Notes (Signed)
Patient noted sleeping in room. No complaints, stable, in no acute distress. Q15 minute rounds and monitoring via Security Cameras to continue.  

## 2016-01-15 NOTE — ED Notes (Signed)
Pt. Transferred to SAPPU from ED to room after screening for contraband. Report to include Situation, Background, Assessment and Recommendations from RN. Pt. Oriented to unit including Q15 minute rounds as well as the security cameras for their protection. Patient is alert and oriented, warm and dry in no acute distress. Patient denies SI, HI, and AVH. Pt. Encouraged to let me know if needs arise. 
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
## 2016-01-15 NOTE — ED Notes (Signed)
Bed: [facility] Expected date:  Expected time:  Means of arrival:  Comments: 

## 2016-01-15 NOTE — ED Triage Notes (Addendum)
Pt brought in voluntarily by [employer]. Pt states he lost his psychiatric medications one week ago. Pt states he would like to get back on his regular lithium, trazodone and Seroquel. Pt states he has been unable to sleep and would like to have a place to rest. Pt is homeless.  Pt also complains of pain in his feet from walking. Pt has large blister on bilateral heels.

## 2016-01-16 DIAGNOSIS — Z811 Family history of alcohol abuse and dependence: Secondary | ICD-10-CM

## 2016-01-16 DIAGNOSIS — Z791 Long term (current) use of non-steroidal anti-inflammatories (NSAID): Secondary | ICD-10-CM

## 2016-01-16 DIAGNOSIS — Z79899 Other long term (current) drug therapy: Secondary | ICD-10-CM

## 2016-01-16 DIAGNOSIS — F3170 Bipolar disorder, currently in remission, most recent episode unspecified: Secondary | ICD-10-CM | POA: Diagnosis present

## 2016-01-16 DIAGNOSIS — Z8489 Family history of other specified conditions: Secondary | ICD-10-CM

## 2016-01-16 MED ORDER — HYDROXYZINE HCL 25 MG PO TABS: 25.0000 mg | ORAL_TABLET | Freq: Four times a day (QID) | ORAL | 0 refills | Status: DC | PRN

## 2016-01-16 MED ORDER — LITHIUM CARBONATE ER 450 MG PO TBCR: 450.0000 mg | EXTENDED_RELEASE_TABLET | Freq: Every day | ORAL | 0 refills | Status: DC

## 2016-01-16 MED ORDER — QUETIAPINE FUMARATE 25 MG PO TABS: 25.0000 mg | ORAL_TABLET | ORAL | 0 refills | Status: DC

## 2016-01-16 MED ORDER — QUETIAPINE FUMARATE 300 MG PO TABS: 300.0000 mg | ORAL_TABLET | Freq: Every day | ORAL | 0 refills | Status: DC

## 2016-01-16 NOTE — ED Notes (Signed)
Jamison DNP into see 

## 2016-01-16 NOTE — ED Notes (Signed)
Patient noted sleeping in room. No complaints, stable, in no acute distress. Q15 minute rounds and monitoring via Security Cameras to continue.  

## 2016-01-16 NOTE — ED Notes (Addendum)
Written dc instructions and prescriptions reviewed with pt.  [facility] and take his medications as directed.  Pt verbalized understanding.  Pt ambulatory w/o difficulty  to dc area w/ mHt, belongings returned after leaving the area. Bus pass given. Shower shoes and socks given for pt to alternate with his shoes until blisters have improved.

## 2016-01-16 NOTE — Consult Note (Signed)
[place] [facility]iat[facility] Consult   Reason for Consult:  Medication refills Referring Physician:  EDP Patient Identification: [name] MRN:  [id number] Principal Diagnosis: Bipolar affective disorder in remission [facility]) Diagnosis:   Patient Active Problem List   Diagnosis Date Noted  . Bipolar affective disorder in remission ([place]) [F31.70] 01/16/2016    Priority: High  . Cocaine use disorder, mild, abuse [F14.10] 12/31/2015    Total Time spent with patient: 45 minutes  Subjective:   [name] is a 55 y.o. male patient does not warrant admission.  HPI:  55 yo male who presented to the ED requesting medication refills.  He was recently at [facility] and ran out of his medications as he did not go to his follow-up appointment at [facility].  Denies suicidal/homicidal ideations, hallucinations, and alcohol/drug abuse.  Stable for discharge.   Past Psychiatric History: bipolar disorder, substance abuse  Risk to Self: Suicidal Ideation: No Suicidal Intent: No Is patient at risk for suicide?: No Suicidal Plan?: No-Not Currently/Within Last 6 Months Access to Means: No What has been your use of drugs/alcohol within the last 12 months?: drinks beer occasionally How many times?: 2 Intentional Self Injurious Behavior: None Risk to Others: Homicidal Ideation: No Thoughts of Harm to Others: No Current Homicidal Intent: No Current Homicidal Plan: No Access to Homicidal Means: No History of harm to others?: No Assessment of Violence: None Noted Does patient have access to weapons?: No Criminal Charges Pending?: No Does patient have a court date: No Prior Inpatient Therapy: Prior Inpatient Therapy: Yes Prior Therapy Dates: Multiple Prior Therapy Facilty/Provider(s): [place] Reason for Treatment: Bipolar d/o Prior Outpatient Therapy: Prior Outpatient Therapy: Yes Prior Therapy Dates: 10/3 Prior Therapy Facilty/Provider(s): [facility] Reason for  Treatment: Medication Mangement Does patient have an ACCT team?: No Does patient have Intensive In-House Services?  : No Does patient have [facility] services? : No Does patient have P4CC services?: No  Past Medical History:  Past Medical History:  Diagnosis Date  . Bipolar 1 disorder [facility])     Past Surgical History:  Procedure Laterality Date  . EYE SURGERY     Family History:  Family History  Problem Relation Age of Onset  . Hyperlipidemia Mother   . Alcoholism Mother   . Suicidality Cousin    Family Psychiatric  History: none Social History:  History  Alcohol Use  . 0.0 oz/week    Comment: occasionally     History  Drug Use  . Types: Cocaine    Comment: Pt denies substance use     Social History   Social History  . Marital status: Single    Spouse name: N/A  . Number of children: N/A  . Years of education: N/A   Social History Main Topics  . Smoking status: Never Smoker  . Smokeless tobacco: Never Used  . Alcohol use 0.0 oz/week     Comment: occasionally  . Drug use:     Types: Cocaine     Comment: Pt denies substance use   . Sexual activity: Not Asked   Other Topics Concern  . None   Social History Narrative  . None   Additional Social History:    Allergies:   Allergies  Allergen Reactions  . Medrol [Methylprednisolone] Rash    Labs:  Results for orders placed or performed during the hospital encounter of 01/15/16 (from the past 48 hour(s))  Ethanol     Status: None   Collection Time: 01/15/16  7:36 PM  Result Value Ref Range   Alcohol, Ethyl (B) <5 <5 mg/dL  Comprehensive metabolic panel     Status: Abnormal   Collection Time: 01/15/16  7:36 PM  Result Value Ref Range   Sodium 137 135 - 145 mmol/L   Potassium 3.4 (L) 3.5 - 5.1 mmol/L   Chloride 108 101 - 111 mmol/L   CO2 24 22 - 32 mmol/L   Glucose, Bld 147 (H) 65 - 99 mg/dL   BUN 12 6 - 20 mg/dL   Creatinine, Ser 1.17 0.61 - 1.24 mg/dL   Calcium 9.1 8.9 - 10.3 mg/dL   Total  Protein 6.6 6.5 - 8.1 g/dL   Albumin 3.7 3.5 - 5.0 g/dL   AST 33 15 - 41 U/L   ALT 25 17 - 63 U/L   Alkaline Phosphatase 73 38 - 126 U/L   Total Bilirubin 0.7 0.3 - 1.2 mg/dL   GFR calc non Af Amer >60 >60 mL/min   GFR calc Af Amer >60 >60 mL/min    Comment: (NOTE) The eGFR has been calculated using the CKD EPI equation. This calculation has not been validated in all clinical situations. eGFR's persistently <60 mL/min signify possible Chronic Kidney Disease.    Anion gap 5 5 - 15  CBC     Status: Abnormal   Collection Time: 01/15/16  7:36 PM  Result Value Ref Range   WBC 7.8 4.0 - 10.5 K/uL   RBC 3.61 (L) 4.22 - 5.81 MIL/uL   Hemoglobin 11.1 (L) 13.0 - 17.0 g/dL   HCT 32.3 (L) 39.0 - 52.0 %   MCV 89.5 78.0 - 100.0 fL   MCH 30.7 26.0 - 34.0 pg   MCHC 34.4 30.0 - 36.0 g/dL   RDW 12.6 11.5 - 15.5 %   Platelets 236 150 - 400 K/uL  Lithium level     Status: None   Collection Time: 01/15/16  7:36 PM  Result Value Ref Range   Lithium Lvl 1.16 0.60 - 1.20 mmol/L  Urine rapid drug screen (hosp performed)     Status: None   Collection Time: 01/15/16  9:03 PM  Result Value Ref Range   Opiates NONE DETECTED NONE DETECTED   Cocaine NONE DETECTED NONE DETECTED   Benzodiazepines NONE DETECTED NONE DETECTED   Amphetamines NONE DETECTED NONE DETECTED   Tetrahydrocannabinol NONE DETECTED NONE DETECTED   Barbiturates NONE DETECTED NONE DETECTED    Comment:        DRUG SCREEN FOR MEDICAL PURPOSES ONLY.  IF CONFIRMATION IS NEEDED FOR ANY PURPOSE, NOTIFY LAB WITHIN 5 DAYS.        LOWEST DETECTABLE LIMITS FOR URINE DRUG SCREEN Drug Class       Cutoff (ng/mL) Amphetamine      1000 Barbiturate      200 Benzodiazepine   500 Tricyclics       938 Opiates          300 Cocaine          300 THC              50     Current Facility-Administered Medications  Medication Dose Route Frequency Provider Last Rate Last Dose  . hydrOXYzine (ATARAX/VISTARIL) tablet 25 mg  25 mg Oral Q6H PRN Fredia Sorrow, MD      . ibuprofen (ADVIL,MOTRIN) tablet 600 mg  600 mg Oral Q8H PRN Fredia Sorrow, MD      . lithium carbonate (ESKALITH) CR tablet 450 mg  450 mg Oral Q12H Fredia Sorrow, MD   450 mg at 01/16/16 0937  . ondansetron (ZOFRAN) tablet 4 mg  4 mg Oral Q8H PRN Fredia Sorrow, MD      . QUEtiapine (SEROQUEL XR) 24 hr tablet 300 mg  300 mg Oral QHS Fredia Sorrow, MD   300 mg at 01/15/16 2142  . QUEtiapine (SEROQUEL) tablet 25 mg  25 mg Oral BID Fredia Sorrow, MD   25 mg at 01/16/16 8115   Current Outpatient Prescriptions  Medication Sig Dispense Refill  . hydrOXYzine (ATARAX/VISTARIL) 25 MG tablet Take 1 tablet (25 mg total) by mouth every 6 (six) hours as needed for anxiety. 28 tablet 0  . ibuprofen (ADVIL,MOTRIN) 800 MG tablet Take 1 tablet (800 mg total) by mouth every 8 (eight) hours as needed. (Patient taking differently: Take 800 mg by mouth every 8 (eight) hours as needed for moderate pain. ) 21 tablet 0  . lithium carbonate (ESKALITH) 450 MG CR tablet Take 1 tablet (450 mg total) by mouth daily with breakfast. 7 tablet 0  . QUEtiapine (SEROQUEL) 25 MG tablet Take 1 tablet (25 mg total) by mouth 2 (two) times daily at 8am and 2pm. 14 tablet 0  . QUEtiapine (SEROQUEL) 300 MG tablet Take 1 tablet (300 mg total) by mouth at bedtime. 7 tablet 0    Musculoskeletal: Strength & Muscle Tone: within normal limits Gait & Station: normal Patient leans: N/A  Psychiatric Specialty Exam: Physical Exam  Constitutional: He is oriented to person, place, and time. He appears well-developed and well-nourished.  HENT:  Head: Normocephalic.  Neck: Normal range of motion.  Respiratory: Effort normal.  Musculoskeletal: Normal range of motion.  Neurological: He is alert and oriented to person, place, and time.  Skin: Skin is warm and dry.  Psychiatric: He has a normal mood and affect. His speech is normal and behavior is normal. Judgment and thought content normal. Cognition and memory  are normal.    Review of Systems  Constitutional: Negative.   HENT: Negative.   Eyes: Negative.   Respiratory: Negative.   Cardiovascular: Negative.   Gastrointestinal: Negative.   Genitourinary: Negative.   Musculoskeletal: Negative.   Skin: Negative.   Neurological: Negative.   Endo/Heme/Allergies: Negative.   Psychiatric/Behavioral: Negative.     Blood pressure 125/73, pulse 62, temperature 97.9 F (36.6 C), temperature source Oral, resp. rate 18, SpO2 98 %.There is no height or weight on file to calculate BMI.  General Appearance: Casual  Eye Contact:  Good  Speech:  Normal Rate  Volume:  Normal  Mood:  Euthymic  Affect:  Congruent  Thought Process:  Coherent and Descriptions of Associations: Intact  Orientation:  Full (Time, Place, and Person)  Thought Content:  WDL  Suicidal Thoughts:  No  Homicidal Thoughts:  No  Memory:  Immediate;   Good Recent;   Good Remote;   Good  Judgement:  Fair  Insight:  Fair  Psychomotor Activity:  Normal  Concentration:  Concentration: Good and Attention Span: Good  Recall:  Good  Fund of Knowledge:  Fair  Language:  Good  Akathisia:  No  Handed:  Right  AIMS (if indicated):     Assets:  Leisure Time Physical Health Resilience  ADL's:  Intact  Cognition:  WNL  Sleep:        Treatment Plan Summary: Daily contact with patient to assess and evaluate symptoms and progress in treatment, Medication management and Plan bipolar affective disorder in remission:  -Crisis stabilization -Medication  management:  Restarted his Seroquel 25 mg BID and 300 mg at bedtime for mood stabilization, Lithium 450 mg BID for bipolar disorder, and Vistaril 25 mg every six hours PRN anxiety. -Individual counseling -Rx provided  Disposition: No evidence of imminent risk to self or others at present.    Patient is referred to out patient medication management in a stable condition.  Waylan Boga, NP 01/16/2016 9:51 AM   Patient is seen face to  face for this psych evaluation, case discussed with treatment team and physician extender. Formulated treatment plan and reviewed the information documented and agree with the treatment plan.  Prapti Grussing 01/17/2016 11:52 AM

## 2016-01-16 NOTE — [facility] Suicide Risk Assessment (Signed)
Suicide Risk Assessment  Discharge Assessment   [facility]   Principal Problem: Bipolar affective disorder in remission [facility](HCC) Discharge Diagnoses:  Patient Active Problem List   Diagnosis Date Noted  . Bipolar affective disorder in remission ([facility]) [F31.70] 01/16/2016    Priority: High  . Cocaine use disorder, mild, abuse [F14.10] 12/31/2015    Total Time spent with patient: 45 minutes  Musculoskeletal: Strength & Muscle Tone: within normal limits Gait & Station: normal Patient leans: N/A  Psychiatric Specialty Exam: Physical Exam  Constitutional: He is oriented to person, place, and time. He appears well-developed and well-nourished.  HENT:  Head: Normocephalic.  Neck: Normal range of motion.  Respiratory: Effort normal.  Musculoskeletal: Normal range of motion.  Neurological: He is alert and oriented to person, place, and time.  Skin: Skin is warm and dry.  Psychiatric: He has a normal mood and affect. His speech is normal and behavior is normal. Judgment and thought content normal. Cognition and memory are normal.    Review of Systems  Constitutional: Negative.   HENT: Negative.   Eyes: Negative.   Respiratory: Negative.   Cardiovascular: Negative.   Gastrointestinal: Negative.   Genitourinary: Negative.   Musculoskeletal: Negative.   Skin: Negative.   Neurological: Negative.   Endo/Heme/Allergies: Negative.   Psychiatric/Behavioral: Negative.     Blood pressure 125/73, pulse 62, temperature 97.9 F (36.6 C), temperature source Oral, resp. rate 18, SpO2 98 %.There is no height or weight on file to calculate BMI.  General Appearance: Casual  Eye Contact:  Good  Speech:  Normal Rate  Volume:  Normal  Mood:  Euthymic  Affect:  Congruent  Thought Process:  Coherent and Descriptions of Associations: Intact  Orientation:  Full (Time, Place, and Person)  Thought Content:  WDL  Suicidal Thoughts:  No  Homicidal Thoughts:  No  Memory:   Immediate;   Good Recent;   Good Remote;   Good  Judgement:  Fair  Insight:  Fair  Psychomotor Activity:  Normal  Concentration:  Concentration: Good and Attention Span: Good  Recall:  Good  Fund of Knowledge:  Fair  Language:  Good  Akathisia:  No  Handed:  Right  AIMS (if indicated):     Assets:  Leisure Time Physical Health Resilience  ADL's:  Intact  Cognition:  WNL  Sleep:       Mental Status Per Nursing Assessment::   On Admission:   medication refill  Demographic Factors:  Male and Caucasian  Loss Factors: NA  Historical Factors: NA  Risk Reduction Factors:   Sense of responsibility to family and Positive therapeutic relationship  Continued Clinical Symptoms:  None   Cognitive Features That Contribute To Risk:  None    Suicide Risk:  Minimal: No identifiable suicidal ideation.  Patients presenting with no risk factors but with morbid ruminations; may be classified as minimal risk based on the severity of the depressive symptoms    Plan Of Care/Follow-up recommendations:  Activity:  as tolerated Diet:  heart healthy diet  Yuvan Medinger, NP 01/16/2016, 9:58 AM

## 2016-01-16 NOTE — ED Notes (Signed)
Dr J into see 

## 2016-01-16 NOTE — ED Notes (Signed)
Up to the bathroom 

## 2016-01-22 ENCOUNTER — Emergency Department (HOSPITAL_COMMUNITY)
Admission: EM | Admit: 2016-01-22 | Discharge: 2016-01-22 | Disposition: A | Discharge status: Home / Self Care | Payer: Self-pay | Attending: Emergency Medicine | Admitting: Emergency Medicine

## 2016-01-22 ENCOUNTER — Encounter (HOSPITAL_COMMUNITY): Payer: Self-pay

## 2016-01-22 DIAGNOSIS — Z79899 Other long term (current) drug therapy: Secondary | ICD-10-CM | POA: Insufficient documentation

## 2016-01-22 DIAGNOSIS — Z9114 Patient's other noncompliance with medication regimen: Secondary | ICD-10-CM | POA: Insufficient documentation

## 2016-01-22 DIAGNOSIS — Z791 Long term (current) use of non-steroidal anti-inflammatories (NSAID): Secondary | ICD-10-CM | POA: Insufficient documentation

## 2016-01-22 LAB — COMPREHENSIVE METABOLIC PANEL
ALK PHOS: 81 U/L (ref 38–126)
ALT: 38 U/L (ref 17–63)
ANION GAP: 9 (ref 5–15)
AST: 73 U/L — ABNORMAL HIGH (ref 15–41)
Albumin: 4.1 g/dL (ref 3.5–5.0)
BILIRUBIN TOTAL: 0.7 mg/dL (ref 0.3–1.2)
BUN: 9 mg/dL (ref 6–20)
CALCIUM: 9.2 mg/dL (ref 8.9–10.3)
CO2: 22 mmol/L (ref 22–32)
Chloride: 110 mmol/L (ref 101–111)
Creatinine, Ser: 1.08 mg/dL (ref 0.61–1.24)
GFR calc non Af Amer: >60 mL/min (ref >60)
Glucose, Bld: 115 mg/dL — ABNORMAL HIGH (ref 65–99)
Potassium: 3.2 mmol/L — ABNORMAL LOW (ref 3.5–5.1)
Sodium: 141 mmol/L (ref 135–145)
TOTAL PROTEIN: 7.1 g/dL (ref 6.5–8.1)

## 2016-01-22 LAB — CBC WITH DIFFERENTIAL/PLATELET
BASOS PCT: 0 %
Basophils Absolute: 0 10*3/uL (ref 0.0–0.1)
EOS ABS: 0.3 10*3/uL (ref 0.0–0.7)
Eosinophils Relative: 4 %
HCT: 35.8 % — ABNORMAL LOW (ref 39.0–52.0)
HEMOGLOBIN: 12.5 g/dL — AB (ref 13.0–17.0)
Lymphocytes Relative: 25 %
Lymphs Abs: 1.9 10*3/uL (ref 0.7–4.0)
MCH: 31 pg (ref 26.0–34.0)
MCHC: 34.9 g/dL (ref 30.0–36.0)
MCV: 88.8 fL (ref 78.0–100.0)
MONOS PCT: 4 %
Monocytes Absolute: 0.3 10*3/uL (ref 0.1–1.0)
NEUTROS PCT: 67 %
Neutro Abs: 5.1 10*3/uL (ref 1.7–7.7)
PLATELETS: 294 10*3/uL (ref 150–400)
RBC: 4.03 MIL/uL — ABNORMAL LOW (ref 4.22–5.81)
RDW: 12.8 % (ref 11.5–15.5)
WBC: 7.6 10*3/uL (ref 4.0–10.5)

## 2016-01-22 LAB — ACETAMINOPHEN LEVEL

## 2016-01-22 LAB — SALICYLATE LEVEL

## 2016-01-22 LAB — ETHANOL: Alcohol, Ethyl (B): 141 mg/dL — ABNORMAL HIGH (ref <5)

## 2016-01-22 MED ORDER — POTASSIUM CHLORIDE CRYS ER 20 MEQ PO TBCR
40.0000 meq | EXTENDED_RELEASE_TABLET | Freq: Once | ORAL | Status: AC
  Administered 2016-01-22: 40 meq via ORAL
  Filled 2016-01-22: qty 2

## 2016-01-22 NOTE — ED Notes (Signed)
Bed: [facility] Expected date:  Expected time:  Means of arrival:  Comments: 

## 2016-01-22 NOTE — Discharge Instructions (Signed)
[facility] during their [facility]-in clinic to have your medications refilled.
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
## 2016-01-22 NOTE — ED Notes (Signed)
PT DISCHARGED. INSTRUCTIONS GIVEN. AAOX4. PT IN NO APPARENT DISTRESS OR PAIN. THE OPPORTUNITY TO ASK QUESTIONS WAS PROVIDED. 

## 2016-01-22 NOTE — BH Assessment (Signed)
Assessment completed. Consulted Nira ConnJason Berry, FNP who agrees that pt does not meet inpatient criteria and should follow up with [facility]. Informed Melburn HakeNicole Nadeau, PA-C of the recommendation.
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
## 2016-01-22 NOTE — ED Triage Notes (Signed)
He states he is unaware of why he is here. He is drowsy and in no distress.
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
## 2016-01-25 ENCOUNTER — Encounter (HOSPITAL_COMMUNITY): Payer: Self-pay

## 2016-01-25 DIAGNOSIS — R51 Headache: Secondary | ICD-10-CM | POA: Insufficient documentation

## 2016-01-25 DIAGNOSIS — Z79899 Other long term (current) drug therapy: Secondary | ICD-10-CM | POA: Insufficient documentation

## 2016-01-25 DIAGNOSIS — F1490 Cocaine use, unspecified, uncomplicated: Secondary | ICD-10-CM | POA: Insufficient documentation

## 2016-01-25 DIAGNOSIS — Z791 Long term (current) use of non-steroidal anti-inflammatories (NSAID): Secondary | ICD-10-CM | POA: Insufficient documentation

## 2016-01-25 DIAGNOSIS — Y939 Activity, unspecified: Secondary | ICD-10-CM | POA: Insufficient documentation

## 2016-01-25 DIAGNOSIS — S50311A Abrasion of right elbow, initial encounter: Secondary | ICD-10-CM | POA: Insufficient documentation

## 2016-01-25 DIAGNOSIS — Y929 Unspecified place or not applicable: Secondary | ICD-10-CM | POA: Insufficient documentation

## 2016-01-25 DIAGNOSIS — S50312A Abrasion of left elbow, initial encounter: Secondary | ICD-10-CM | POA: Insufficient documentation

## 2016-01-25 DIAGNOSIS — Z760 Encounter for issue of repeat prescription: Secondary | ICD-10-CM | POA: Insufficient documentation

## 2016-01-25 DIAGNOSIS — Y999 Unspecified external cause status: Secondary | ICD-10-CM | POA: Insufficient documentation
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
## 2016-01-26 ENCOUNTER — Emergency Department (HOSPITAL_COMMUNITY)
Admission: EM | Admit: 2016-01-26 | Discharge: 2016-01-26 | Disposition: A | Discharge status: Home / Self Care | Payer: Self-pay | Attending: Emergency Medicine | Admitting: Emergency Medicine

## 2016-01-26 DIAGNOSIS — Z760 Encounter for issue of repeat prescription: Secondary | ICD-10-CM

## 2016-01-26 DIAGNOSIS — R51 Headache: Secondary | ICD-10-CM

## 2016-01-26 DIAGNOSIS — R519 Headache, unspecified: Secondary | ICD-10-CM

## 2016-01-26 MED ORDER — BACITRACIN ZINC 500 UNIT/GM EX OINT
TOPICAL_OINTMENT | Freq: Two times a day (BID) | CUTANEOUS | Status: DC
  Administered 2016-01-26: 2 via TOPICAL
  Filled 2016-01-26: qty 1.8

## 2016-01-26 MED ORDER — HYDROXYZINE HCL 25 MG PO TABS: 25.0000 mg | ORAL_TABLET | Freq: Four times a day (QID) | ORAL | 0 refills | Status: DC | PRN

## 2016-01-26 MED ORDER — QUETIAPINE FUMARATE 300 MG PO TABS: 300.0000 mg | ORAL_TABLET | Freq: Every day | ORAL | 0 refills | Status: DC

## 2016-01-26 MED ORDER — LITHIUM CARBONATE ER 450 MG PO TBCR: 450.0000 mg | EXTENDED_RELEASE_TABLET | Freq: Every day | ORAL | 0 refills | Status: DC

## 2016-01-26 MED ORDER — QUETIAPINE FUMARATE 25 MG PO TABS: 25.0000 mg | ORAL_TABLET | ORAL | 0 refills | Status: DC

## 2016-01-26 NOTE — Discharge Instructions (Signed)
Please read and follow all provided instructions.  Your diagnoses today include:  1. Facial pain   2. Medication refill    Tests performed today include:  Vital signs. See below for your results today.   Home care instructions:  Follow any educational materials contained in this packet.  Follow-up with [facility] in the next 3 days for refills of your medications.  Follow-up instructions: Please follow-up with your primary care provider in the next 3 days for further evaluation of your symptoms.   Return instructions:   Please return to the Emergency Department if you experience worsening symptoms.   Please return if you have any other emergent concerns.  Additional Information:  Your vital signs today were: BP 133/78 (BP Location: Left Arm)    Pulse (!) 58    Temp 97.8 F (36.6 C) (Oral)    Resp 13    SpO2 99%  If your blood pressure (BP) was elevated above 135/85 this visit, please have this repeated by your doctor within one month. --------------
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
## 2016-02-15 ENCOUNTER — Emergency Department (HOSPITAL_COMMUNITY)
Admission: EM | Admit: 2016-02-15 | Discharge: 2016-02-15 | Disposition: A | Discharge status: Home / Self Care | Payer: Self-pay | Attending: Emergency Medicine | Admitting: Emergency Medicine

## 2016-02-15 ENCOUNTER — Encounter (HOSPITAL_COMMUNITY): Payer: Self-pay | Admitting: Emergency Medicine

## 2016-02-15 DIAGNOSIS — Y939 Activity, unspecified: Secondary | ICD-10-CM | POA: Insufficient documentation

## 2016-02-15 DIAGNOSIS — W57XXXA Bitten or stung by nonvenomous insect and other nonvenomous arthropods, initial encounter: Secondary | ICD-10-CM | POA: Insufficient documentation

## 2016-02-15 DIAGNOSIS — S80862A Insect bite (nonvenomous), left lower leg, initial encounter: Secondary | ICD-10-CM | POA: Insufficient documentation

## 2016-02-15 DIAGNOSIS — Y999 Unspecified external cause status: Secondary | ICD-10-CM | POA: Insufficient documentation

## 2016-02-15 DIAGNOSIS — Y929 Unspecified place or not applicable: Secondary | ICD-10-CM | POA: Insufficient documentation

## 2016-02-15 DIAGNOSIS — F319 Bipolar disorder, unspecified: Secondary | ICD-10-CM | POA: Insufficient documentation

## 2016-02-15 DIAGNOSIS — S80812A Abrasion, left lower leg, initial encounter: Secondary | ICD-10-CM | POA: Insufficient documentation

## 2016-02-15 DIAGNOSIS — Z79899 Other long term (current) drug therapy: Secondary | ICD-10-CM | POA: Insufficient documentation

## 2016-02-15 DIAGNOSIS — S80811A Abrasion, right lower leg, initial encounter: Secondary | ICD-10-CM | POA: Insufficient documentation

## 2016-02-15 DIAGNOSIS — T148XXA Other injury of unspecified body region, initial encounter: Secondary | ICD-10-CM

## 2016-02-15 DIAGNOSIS — S80861A Insect bite (nonvenomous), right lower leg, initial encounter: Secondary | ICD-10-CM | POA: Insufficient documentation

## 2016-02-15 MED ORDER — HYDROCORTISONE 1 % EX CREA
TOPICAL_CREAM | Freq: Two times a day (BID) | CUTANEOUS | Status: DC
  Filled 2016-02-15: qty 28
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
## 2016-02-15 NOTE — ED Notes (Signed)
Bed: [facility] Expected date:  Expected time:  Means of arrival:  Comments: 

## 2016-02-15 NOTE — ED Notes (Signed)
Pt given hydrocortisone cream with instructions to apply 2 times daily
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
## 2016-02-15 NOTE — ED Notes (Signed)
MD at bedside. 

## 2017-07-20 ENCOUNTER — Ambulatory Visit (HOSPITAL_COMMUNITY)
Admission: EM | Admit: 2017-07-20 | Discharge: 2017-07-20 | Disposition: A | Discharge status: Home / Self Care | Payer: 59 | Attending: Family Medicine | Admitting: Family Medicine

## 2017-07-20 DIAGNOSIS — K0889 Other specified disorders of teeth and supporting structures: Secondary | ICD-10-CM

## 2017-07-20 MED ORDER — AMOXICILLIN 500 MG PO CAPS: 500.0000 mg | ORAL_CAPSULE | Freq: Three times a day (TID) | ORAL | 0 refills | Status: DC

## 2017-07-20 NOTE — ED Triage Notes (Signed)
Pt here for dental pain. Reports that he has some cracked teeth and needs to see a dentist.

## 2017-07-20 NOTE — ED Provider Notes (Signed)
[facility]    CSN: [id number] Arrival date & time: 07/20/17  1652     History   Chief Complaint Chief Complaint  Patient presents with  . Dental Pain    HPI [name] is a 57 y.o. male.   57 yo male here for bilateral dental pain x 3 days. He says he know he has some cracked teeth. He just got insurance so plans to see a dentist soon. Denies fever, sweats, chills.      Past Medical History:  Diagnosis Date  . Bipolar 1 disorder [facility])     Patient Active Problem List   Diagnosis Date Noted  . Bipolar affective disorder in remission (HCC) 01/16/2016  . Cocaine use disorder, mild, abuse (HCC) 12/31/2015    Past Surgical History:  Procedure Laterality Date  . EYE SURGERY         Home Medications    Prior to Admission medications   Medication Sig Start Date End Date Taking? Authorizing Provider  hydrOXYzine (ATARAX/VISTARIL) 25 MG tablet Take 1 tablet (25 mg total) by mouth every 6 (six) hours as needed for anxiety. Patient not taking: Reported on 02/15/2016 01/26/16   Renne Crigler, PA-C  ibuprofen (ADVIL,MOTRIN) 800 MG tablet Take 1 tablet (800 mg total) by mouth every 8 (eight) hours as needed. Patient not taking: Reported on 02/15/2016 01/11/16   Charlestine Night, PA-C  lithium carbonate (ESKALITH) 450 MG CR tablet Take 1 tablet (450 mg total) by mouth daily with breakfast. 01/26/16   Renne Crigler, PA-C  QUEtiapine (SEROQUEL) 25 MG tablet Take 1 tablet (25 mg total) by mouth 2 (two) times daily at 8am and 2pm. 01/26/16   Renne Crigler, PA-C  QUEtiapine (SEROQUEL) 300 MG tablet Take 1 tablet (300 mg total) by mouth at bedtime. 01/26/16   Renne Crigler, PA-C    Family History Family History  Problem Relation Age of Onset  . Hyperlipidemia Mother   . Alcoholism Mother   . Suicidality Cousin     Social History Social History   Tobacco Use  . Smoking status: Never Smoker  . Smokeless tobacco: Never Used  Substance Use Topics  .  Alcohol use: Yes    Alcohol/week: 0.0 oz    Comment: occasionally  . Drug use: No    Types: Cocaine    Comment: Used to be a cocaine user     Allergies   Medrol [methylprednisolone]   Review of Systems Review of Systems  Constitutional: Negative for activity change and appetite change.  HENT: Positive for dental problem. Negative for congestion.   Eyes: Negative for discharge and itching.  Respiratory: Negative for apnea and chest tightness.   Cardiovascular: Negative for chest pain and leg swelling.  Gastrointestinal: Negative for abdominal distention and abdominal pain.  Endocrine: Negative for cold intolerance and heat intolerance.  Genitourinary: Negative for difficulty urinating and dysuria.  Musculoskeletal: Negative for arthralgias and back pain.  Neurological: Negative for dizziness and headaches.  Hematological: Negative for adenopathy. Does not bruise/bleed easily.     Physical Exam Triage Vital Signs ED Triage Vitals  Enc Vitals Group     BP 07/20/17 1802 (!) 162/101     Pulse Rate 07/20/17 1802 66     Resp 07/20/17 1802 18     Temp 07/20/17 1802 98.2 F (36.8 C)     Temp src --      SpO2 07/20/17 1802 97 %     Weight --      Height --  Head Circumference --      Peak Flow --      Pain Score 07/20/17 1759 10     Pain Loc --      Pain Edu? --      Excl. in GC? --    No data found.  Updated Vital Signs BP (!) 162/101   Pulse 66   Temp 98.2 F (36.8 C)   Resp 18   SpO2 97%   Visual Acuity Right Eye Distance:   Left Eye Distance:   Bilateral Distance:    Right Eye Near:   Left Eye Near:    Bilateral Near:     Physical Exam  Constitutional: He is oriented to person, place, and time. He appears well-nourished.  HENT:  Head: Normocephalic and atraumatic.  Overall poor dentition. He does have upper incisor that is cracked and surrounding gums are swollen  Eyes: Pupils are equal, round, and reactive to light. EOM are normal.  Neck:  Normal range of motion. Neck supple.  Pulmonary/Chest: Effort normal. No respiratory distress.  Musculoskeletal: Normal range of motion. He exhibits no edema.  Neurological: He is oriented to person, place, and time.  Skin: Skin is warm and dry.  Psychiatric: He has a normal mood and affect. His behavior is normal.     UC Treatments / Results  Labs (all labs ordered are listed, but only abnormal results are displayed) Labs Reviewed - No data to display  EKG None Radiology No results found.  Procedures Procedures (including critical care time)  Medications Ordered in UC Medications - No data to display   Initial Impression / Assessment and Plan / UC Course  I have reviewed the triage vital signs and the nursing notes.  Pertinent labs & imaging results that were available during my care of the patient were reviewed by me and considered in my medical decision making (see chart for details).     1. Dental pain- will treat with antibiotic at this time. Tylenol for pain. Follow up with dentist.  Final Clinical Impressions(s) / UC Diagnoses   Final diagnoses:  None    ED Discharge Orders    None       Controlled Substance Prescriptions [place] Controlled Substance Registry consulted? Not Applicable   Rolm BookbinderMoss, Karson Reede, DO 07/20/17 1818

## 2017-07-27 ENCOUNTER — Encounter (HOSPITAL_COMMUNITY): Payer: Self-pay | Admitting: Emergency Medicine

## 2017-07-27 ENCOUNTER — Ambulatory Visit (HOSPITAL_COMMUNITY)
Admission: EM | Admit: 2017-07-27 | Discharge: 2017-07-27 | Disposition: A | Discharge status: Home / Self Care | Payer: 59 | Attending: Family Medicine | Admitting: Family Medicine

## 2017-07-27 DIAGNOSIS — K0889 Other specified disorders of teeth and supporting structures: Secondary | ICD-10-CM | POA: Diagnosis not present

## 2017-07-27 MED ORDER — OXYCODONE-ACETAMINOPHEN 5-325 MG PO TABS: 1.0000 | ORAL_TABLET | ORAL | 0 refills | Status: DC | PRN

## 2017-07-27 NOTE — ED Provider Notes (Signed)
[facility]    CSN: [id number] Arrival date & time: 07/27/17  1228     History   Chief Complaint Chief Complaint  Patient presents with  . Dental Pain    HPI [name] is a 57 y.o. male.   57 yo male here for continued dental pain. Tried antibiotic but only helped a little. He is most concerned about the pain and would like pain medication. He admits to some swelling around his tooth. Denies fever, sweats, chills.      Past Medical History:  Diagnosis Date  . Bipolar 1 disorder [facility](HCC)     Patient Active Problem List   Diagnosis Date Noted  . Bipolar affective disorder in remission (HCC) 01/16/2016  . Cocaine use disorder, mild, abuse (HCC) 12/31/2015    Past Surgical History:  Procedure Laterality Date  . EYE SURGERY         Home Medications    Prior to Admission medications   Medication Sig Start Date End Date Taking? Authorizing Provider  amoxicillin (AMOXIL) 500 MG capsule Take 1 capsule (500 mg total) by mouth 3 (three) times daily. 07/20/17   Zeenat Jeanbaptiste, DO  hydrOXYzine (ATARAX/VISTARIL) 25 MG tablet Take 1 tablet (25 mg total) by mouth every 6 (six) hours as needed for anxiety. Patient not taking: Reported on 02/15/2016 01/26/16   Renne CriglerGeiple, Joshua, PA-C  ibuprofen (ADVIL,MOTRIN) 800 MG tablet Take 1 tablet (800 mg total) by mouth every 8 (eight) hours as needed. Patient not taking: Reported on 02/15/2016 01/11/16   Charlestine NightLawyer, Christopher, PA-C  lithium carbonate (ESKALITH) 450 MG CR tablet Take 1 tablet (450 mg total) by mouth daily with breakfast. 01/26/16   Renne CriglerGeiple, Joshua, PA-C  QUEtiapine (SEROQUEL) 25 MG tablet Take 1 tablet (25 mg total) by mouth 2 (two) times daily at 8am and 2pm. 01/26/16   Renne CriglerGeiple, Joshua, PA-C  QUEtiapine (SEROQUEL) 300 MG tablet Take 1 tablet (300 mg total) by mouth at bedtime. 01/26/16   Renne CriglerGeiple, Joshua, PA-C    Family History Family History  Problem Relation Age of Onset  . Hyperlipidemia Mother   . Alcoholism  Mother   . Suicidality Cousin     Social History Social History   Tobacco Use  . Smoking status: Never Smoker  . Smokeless tobacco: Never Used  Substance Use Topics  . Alcohol use: Yes    Alcohol/week: 0.0 oz    Comment: occasionally  . Drug use: No    Types: Cocaine    Comment: Used to be a cocaine user     Allergies   Medrol [methylprednisolone]   Review of Systems Review of Systems  Constitutional: Negative for activity change and appetite change.  HENT: Positive for dental problem. Negative for congestion.   Eyes: Negative for discharge and itching.  Respiratory: Negative for apnea and chest tightness.   Cardiovascular: Negative for chest pain.  Gastrointestinal: Negative for abdominal distention and abdominal pain.  Endocrine: Negative for cold intolerance and heat intolerance.  Genitourinary: Negative for difficulty urinating and dysuria.  Musculoskeletal: Negative for arthralgias and back pain.  Neurological: Negative for dizziness and headaches.     Physical Exam Triage Vital Signs ED Triage Vitals [07/27/17 1331]  Enc Vitals Group     BP (!) 154/102     Pulse Rate 64     Resp 16     Temp 98.2 F (36.8 C)     Temp Source Oral     SpO2 98 %     Weight  Height      Head Circumference      Peak Flow      Pain Score      Pain Loc      Pain Edu?      Excl. in GC?    No data found.  Updated Vital Signs BP (!) 154/102 (BP Location: Right Arm) Comment: Notified TIna G  Pulse 64   Temp 98.2 F (36.8 C) (Oral)   Resp 16   SpO2 98%   Visual Acuity Right Eye Distance:   Left Eye Distance:   Bilateral Distance:    Right Eye Near:   Left Eye Near:    Bilateral Near:     Physical Exam  Constitutional: He is oriented to person, place, and time. He appears well-developed and well-nourished.  HENT:  Head: Normocephalic and atraumatic.  Eyes: Pupils are equal, round, and reactive to light. EOM are normal.  Neck: Normal range of motion. Neck  supple.  Pulmonary/Chest: Effort normal. No respiratory distress.  Musculoskeletal: Normal range of motion. He exhibits no edema.  Neurological: He is alert and oriented to person, place, and time.  Skin: Skin is warm and dry.  Psychiatric: He has a normal mood and affect. His behavior is normal.     UC Treatments / Results  Labs (all labs ordered are listed, but only abnormal results are displayed) Labs Reviewed - No data to display  EKG None Radiology No results found.  Procedures Procedures (including critical care time)  Medications Ordered in UC Medications - No data to display   Initial Impression / Assessment and Plan / UC Course  I have reviewed the triage vital signs and the nursing notes.  Pertinent labs & imaging results that were available during my care of the patient were reviewed by me and considered in my medical decision making (see chart for details).     1. Dental pain- will give short course of percocet. He will follow up with dentist on Tuesday.  Final Clinical Impressions(s) / UC Diagnoses   Final diagnoses:  None    ED Discharge Orders    None       Controlled Substance Prescriptions [place] Controlled Substance Registry consulted? Not Applicable   Rolm Bookbinder, DO 07/27/17 1404

## 2017-07-27 NOTE — ED Triage Notes (Signed)
Pt here for dental pain, was given antibiotics from his dentist, but c/o of ongoing pain. Has appt for dentist next week

## 2017-07-29 ENCOUNTER — Encounter (HOSPITAL_COMMUNITY): Payer: Self-pay | Admitting: Emergency Medicine

## 2017-07-29 ENCOUNTER — Emergency Department (HOSPITAL_COMMUNITY): Payer: 59

## 2017-07-29 ENCOUNTER — Other Ambulatory Visit: Payer: Self-pay

## 2017-07-29 ENCOUNTER — Emergency Department (HOSPITAL_COMMUNITY)
Admission: EM | Admit: 2017-07-29 | Discharge: 2017-07-29 | Disposition: A | Discharge status: Home / Self Care | Payer: 59 | Attending: Emergency Medicine | Admitting: Emergency Medicine

## 2017-07-29 DIAGNOSIS — Z79899 Other long term (current) drug therapy: Secondary | ICD-10-CM | POA: Diagnosis not present

## 2017-07-29 DIAGNOSIS — R51 Headache: Secondary | ICD-10-CM | POA: Diagnosis present

## 2017-07-29 DIAGNOSIS — K047 Periapical abscess without sinus: Secondary | ICD-10-CM | POA: Insufficient documentation

## 2017-07-29 MED ORDER — IOPAMIDOL (ISOVUE-300) INJECTION 61%
100.0000 mL | Freq: Once | INTRAVENOUS | Status: AC | PRN
  Administered 2017-07-29: 100 mL via INTRAVENOUS

## 2017-07-29 MED ORDER — AMPICILLIN-SULBACTAM SODIUM 3 (2-1) G IJ SOLR
3.0000 g | Freq: Once | INTRAMUSCULAR | Status: AC
  Administered 2017-07-29: 3 g via INTRAVENOUS
  Filled 2017-07-29: qty 3

## 2017-07-29 MED ORDER — IOPAMIDOL (ISOVUE-300) INJECTION 61%
INTRAVENOUS | Status: AC
  Filled 2017-07-29: qty 100

## 2017-07-29 NOTE — ED Triage Notes (Signed)
Patient presents to ED for assessment of right dental pain, facial pain and headache.  Recently given abx from a [facility], then had a tooth removed Friday, and is still having pain, stating he can't sleep, worried about spreading infection.  No difficulty swallowing/eating

## 2017-07-29 NOTE — ED Provider Notes (Signed)
[facility] Provider Note   CSN: [id number] Arrival date & time: 07/29/17  1421     History   Chief Complaint Chief Complaint  Patient presents with  . 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 Patient reports facial pain and headache s/p tooth extraction 3 days ago. Patient states he can not sleep due to the pain and is worried about infection. Patient taking Amoxicillin, Flagyl, Z-pak, Tylenol #3. Patient reports that the dentist told him if his face swelling and there is redness to go to the ED.   HPI  Past Medical History:  Diagnosis Date  . Bipolar 1 disorder [facility](HCC)     Patient Active Problem List   Diagnosis Date Noted  . Bipolar affective disorder in remission (HCC) 01/16/2016  . Cocaine use disorder, mild, abuse (HCC) 12/31/2015    Past Surgical History:  Procedure Laterality Date  . EYE SURGERY          Home Medications    Prior to Admission medications   Medication Sig Start Date End Date Taking? Authorizing Provider  amoxicillin (AMOXIL) 500 MG capsule Take 1 capsule (500 mg total) by mouth 3 (three) times daily. 07/20/17   Moss, Amber, DO  lithium carbonate (ESKALITH) 450 MG CR tablet Take 1 tablet (450 mg total) by mouth daily with breakfast. 01/26/16   Renne CriglerGeiple, Joshua, PA-C  oxyCODONE-acetaminophen (PERCOCET/ROXICET) 5-325 MG tablet Take 1 tablet by mouth every 4 (four) hours as needed for severe pain. 07/27/17   Moss, Amber, DO  QUEtiapine (SEROQUEL) 25 MG tablet Take 1 tablet (25 mg total) by mouth 2 (two) times daily at 8am and 2pm. 01/26/16   Emmit AlexandersGeiple, Ivin BootyJoshua, PA-C  QUEtiapine (SEROQUEL) 300 MG tablet Take 1 tablet (300 mg total) by mouth at bedtime. 01/26/16   Renne CriglerGeiple, Joshua, PA-C    Family History Family History  Problem Relation Age of Onset  . Hyperlipidemia Mother   . Alcoholism Mother   . Suicidality Cousin     Social History Social History    Tobacco Use  . Smoking status: Never Smoker  . Smokeless tobacco: Never Used  Substance Use Topics  . Alcohol use: Yes    Alcohol/week: 0.0 oz    Comment: occasionally  . Drug use: No    Types: Cocaine    Comment: Used to be a cocaine user     Allergies   Medrol [methylprednisolone]   Review of Systems Review of Systems  Constitutional: Negative for chills, fatigue and fever.  HENT: Positive for dental problem and facial swelling.   Eyes: Positive for redness.  Respiratory: Negative for cough and shortness of breath.   Cardiovascular: Negative for chest pain.  Gastrointestinal: Negative for abdominal pain, nausea and vomiting.  Musculoskeletal: Negative for neck pain and neck stiffness.  Skin: Negative for rash.  Neurological: Positive for headaches.     Physical Exam Updated Vital Signs BP (!) 169/93   Pulse (!) 57   Temp 98.7 F (37.1 C) (Oral)   Resp 16   Ht 5\' 11"  (1.803 m)   Wt 79.4 kg (175 lb)   SpO2 100%   BMI 24.41 kg/m   Physical Exam  Constitutional: He appears well-developed and well-nourished. No distress.  HENT:  Nose: Nose normal.  Mouth/Throat: Uvula is midline. No trismus in the jaw. Abnormal dentition.  Dental extraction right upper dental area. Gum surrounding the tooth is swollen and  there is erythema. No drainage noted. There is mild swelling to the right side of face and around the right eye. Tender on exam.   Eyes: Pupils are equal, round, and reactive to light. Conjunctivae and EOM are normal.  Neck: Neck supple.  Pulmonary/Chest: Effort normal.  Abdominal: Soft. There is no tenderness.  Musculoskeletal: Normal range of motion.  Lymphadenopathy:    He has cervical adenopathy.  Neurological: He is alert.  Skin: Skin is warm and dry.  Psychiatric: He has a normal mood and affect. His behavior is normal.  Nursing note and vitals reviewed.    ED Treatments / Results  Labs (all labs ordered are listed, but only abnormal results  are displayed) Labs Reviewed - No data to display  Radiology Ct Maxillofacial W Contrast  Result Date: 07/29/2017 CLINICAL DATA:  Right dental pain.  Facial pain and headache. EXAM: CT MAXILLOFACIAL WITH CONTRAST TECHNIQUE: Multidetector CT imaging of the maxillofacial structures was performed with intravenous contrast. Multiplanar CT image reconstructions were also generated. CONTRAST:  ISOVUE-300 IOPAMIDOL (ISOVUE-300) INJECTION 61% COMPARISON:  None. FINDINGS: Osseous: There is an empty tooth 7 alveolus with mottled lucency in the soft tissues along the lingual surface, gauze versus is soft tissue emphysema. No drainable fluid collection. There is regional cellulitic changes. Swelling continues into the right lower jaw where there is platysma thickening. Prominent degenerative spurring of the facets. Orbits: Negative Sinuses: Generalized mild mucosal thickening in the paranasal sinuses. Soft tissues: As above. Limited intracranial: Negative IMPRESSION: Odontogenic infection presumably related to tooth 7 which has been recently extracted. Mottled lucency in the associated lingual soft tissues could be gauze or gingival soft tissue emphysema. No drainable fluid collection. Electronically Signed   By: Marnee Spring M.D.   On: 07/29/2017 20:50    Procedures Procedures (including critical care time)  Medications Ordered in ED Medications  iopamidol (ISOVUE-300) 61 % injection (has no administration in time range)  iopamidol (ISOVUE-300) 61 % injection 100 mL (100 mLs Intravenous Contrast Given 07/29/17 2034)  Ampicillin-Sulbactam (UNASYN) 3 g in sodium chloride 0.9 % 100 mL IVPB (0 g Intravenous Stopped 07/29/17 2241)     Initial Impression / Assessment and Plan / ED Course  I have reviewed the triage vital signs and the nursing notes. 57 y.o. male with facial pain and swelling s/p dental extraction stable for d/c without abscess noted on CT scan. Will treat for Cellulitis. Patient treated  with IV Unasyn and he is to f/u with the dentist tomorrow. I discussed this case with Dr. Particia Nearing.  Patient to continue all his antibiotics that he is currently taking.   Final Clinical Impressions(s) / ED Diagnoses   Final diagnoses:  Dental infection    ED Discharge Orders    None       Kerrie [place], [place] 07/29/17 2253    [name], MD 08/02/17 [id number]

## 2017-07-29 NOTE — Discharge Instructions (Addendum)
Continue your antibiotics as directed and follow up with your dentist tomorrow.

## 2017-08-14 ENCOUNTER — Other Ambulatory Visit (HOSPITAL_BASED_OUTPATIENT_CLINIC_OR_DEPARTMENT_OTHER): Payer: Self-pay

## 2017-08-14 DIAGNOSIS — G4733 Obstructive sleep apnea (adult) (pediatric): Secondary | ICD-10-CM

## 2017-09-10 ENCOUNTER — Emergency Department (HOSPITAL_COMMUNITY)
Admission: EM | Admit: 2017-09-10 | Discharge: 2017-09-10 | Disposition: A | Discharge status: Home / Self Care | Payer: 59 | Attending: Emergency Medicine | Admitting: Emergency Medicine

## 2017-09-10 ENCOUNTER — Encounter (HOSPITAL_COMMUNITY): Payer: Self-pay | Admitting: Emergency Medicine

## 2017-09-10 ENCOUNTER — Other Ambulatory Visit: Payer: Self-pay

## 2017-09-10 DIAGNOSIS — Z79899 Other long term (current) drug therapy: Secondary | ICD-10-CM | POA: Diagnosis not present

## 2017-09-10 DIAGNOSIS — F1410 Cocaine abuse, uncomplicated: Secondary | ICD-10-CM | POA: Insufficient documentation

## 2017-09-10 DIAGNOSIS — F3170 Bipolar disorder, currently in remission, most recent episode unspecified: Secondary | ICD-10-CM | POA: Diagnosis present

## 2017-09-10 DIAGNOSIS — F3112 Bipolar disorder, current episode manic without psychotic features, moderate: Secondary | ICD-10-CM

## 2017-09-10 DIAGNOSIS — F3110 Bipolar disorder, current episode manic without psychotic features, unspecified: Secondary | ICD-10-CM | POA: Insufficient documentation

## 2017-09-10 LAB — COMPREHENSIVE METABOLIC PANEL
ALBUMIN: 4.5 g/dL (ref 3.5–5.0)
ALK PHOS: 81 U/L (ref 38–126)
ALT: 29 U/L (ref 17–63)
ANION GAP: 13 (ref 5–15)
AST: 44 U/L — ABNORMAL HIGH (ref 15–41)
BILIRUBIN TOTAL: 0.8 mg/dL (ref 0.3–1.2)
BUN: 15 mg/dL (ref 6–20)
CALCIUM: 9.5 mg/dL (ref 8.9–10.3)
CO2: 20 mmol/L — ABNORMAL LOW (ref 22–32)
Chloride: 105 mmol/L (ref 101–111)
Creatinine, Ser: 1.25 mg/dL — ABNORMAL HIGH (ref 0.61–1.24)
GFR calc Af Amer: >60 mL/min (ref >60)
GFR calc non Af Amer: >60 mL/min (ref >60)
GLUCOSE: 117 mg/dL — AB (ref 65–99)
POTASSIUM: 3.7 mmol/L (ref 3.5–5.1)
Sodium: 138 mmol/L (ref 135–145)
TOTAL PROTEIN: 7.9 g/dL (ref 6.5–8.1)

## 2017-09-10 LAB — CBC WITH DIFFERENTIAL/PLATELET
BASOS ABS: 0 10*3/uL (ref 0.0–0.1)
BASOS PCT: 0 %
EOS ABS: 0.1 10*3/uL (ref 0.0–0.7)
EOS PCT: 0 %
HCT: 37.2 % — ABNORMAL LOW (ref 39.0–52.0)
Hemoglobin: 12.8 g/dL — ABNORMAL LOW (ref 13.0–17.0)
Lymphocytes Relative: 14 %
Lymphs Abs: 1.6 10*3/uL (ref 0.7–4.0)
MCH: 30.9 pg (ref 26.0–34.0)
MCHC: 34.4 g/dL (ref 30.0–36.0)
MCV: 89.9 fL (ref 78.0–100.0)
MONO ABS: 0.6 10*3/uL (ref 0.1–1.0)
Monocytes Relative: 6 %
Neutro Abs: 9.4 10*3/uL — ABNORMAL HIGH (ref 1.7–7.7)
Neutrophils Relative %: 80 %
PLATELETS: 295 10*3/uL (ref 150–400)
RBC: 4.14 MIL/uL — ABNORMAL LOW (ref 4.22–5.81)
RDW: 12.8 % (ref 11.5–15.5)
WBC: 11.7 10*3/uL — ABNORMAL HIGH (ref 4.0–10.5)

## 2017-09-10 LAB — RAPID URINE DRUG SCREEN, HOSP PERFORMED
Amphetamines: NOT DETECTED
BARBITURATES: NOT DETECTED
BENZODIAZEPINES: NOT DETECTED
COCAINE: NOT DETECTED
Opiates: NOT DETECTED
TETRAHYDROCANNABINOL: NOT DETECTED

## 2017-09-10 LAB — ETHANOL: Alcohol, Ethyl (B): 14 mg/dL — ABNORMAL HIGH (ref <10)

## 2017-09-10 LAB — LITHIUM LEVEL: LITHIUM LVL: 0.32 mmol/L — AB (ref 0.60–1.20)

## 2017-09-10 NOTE — BH Assessment (Addendum)
Assessment Note  [name] is an 57 y.o. male.  The pt was brought in by the  police after he reported people were stealing from his hotel room.  The pt spoke in a loud, rapid and pressured pace.  His thought process jumped from topic to topic.  The pt showed some grandiose thinking, such as saying he is running for president in 2024.  He also stated he is planning to sue a millionaire who lives in "[place]".  The pt was last hospitalized in 2017 for a similiar presentation.   The pt reported he is not seeing a counselor or psychiatrist.  He is working at a [employer].  The pt  denies SI, self harm, HI and access to guns.  The pt denies legal issues, history of abuse or hallucinations.  The pt stated he is not sleeping well and is sleeping about one hour a night.  He stated his appetite is fine.  He denies any symptoms of depression.  The pt reports he is drinking about 12 beers a day.  His blood alcohol level was 14 when he came to the hospital.  The pt stated he last did cocaine 6 months ago.  His UDS was negative for all substances.  The pt denies SI, HI, and psychosis.  Diagnosis: F31.13 Bipolar I disorder, Current or most recent episode manic, Severe F10.20 Alcohol use disorder, Severe   Past Medical History:  Past Medical History:  Diagnosis Date  . Bipolar 1 disorder [facility])     Past Surgical History:  Procedure Laterality Date  . EYE SURGERY      Family History:  Family History  Problem Relation Age of Onset  . Hyperlipidemia Mother   . Alcoholism Mother   . Suicidality Cousin     Social History:  reports that he has never smoked. He has never used smokeless tobacco. He reports that he drinks alcohol. He reports that he does not use drugs.  Additional Social History:  Alcohol / Drug Use Pain Medications: See MAR Prescriptions: See MAR Over the Counter: See MAR History of alcohol / drug use?: Yes Longest period of sobriety (when/how long):  unknown Substance #1 Name of Substance 1: alcohol 1 - Age of First Use: 20 1 - Amount (size/oz): 12 beers a day 1 - Frequency: daily 1 - Duration: 35 years 1 - Last Use / Amount: 09/09/17  CIWA: CIWA-Ar BP: (!) 143/81 Pulse Rate: 95 COWS:    Allergies:  Allergies  Allergen Reactions  . Medrol [Methylprednisolone] Rash    Home Medications:  (Not in a hospital admission)  OB/GYN Status:  No LMP for male patient.  General Assessment Data Location of Assessment: [facility] TTS Assessment: In system Is this a Tele or Face-to-Face Assessment?: Face-to-Face Is this an Initial Assessment or a Re-assessment for this encounter?: Initial Assessment Marital status: Widowed [place] name: NA Is patient pregnant?: Other (Comment)(male) Living Arrangements: Other (Comment)(in a hotel) Can pt return to current living arrangement?: Yes Admission Status: Voluntary Is patient capable of signing voluntary admission?: Yes Referral Source: Self/Family/Friend Insurance type: [place]     Crisis Care Plan Living Arrangements: Other (Comment)(in a hotel) Legal Guardian: Other:(Self) Name of Psychiatrist: none Name of Therapist: none  Education Status Is patient currently in school?: No Is the patient employed, unemployed or receiving disability?: Employed  Risk to self with the past 6 months Suicidal Ideation: No Has patient been a risk to self within the past 6 months prior to  admission? : No Suicidal Intent: No Has patient had any suicidal intent within the past 6 months prior to admission? : No Is patient at risk for suicide?: No Suicidal Plan?: No Has patient had any suicidal plan within the past 6 months prior to admission? : No Access to Means: No What has been your use of drugs/alcohol within the last 12 months?: alcohol use Previous Attempts/Gestures: No How many times?: 0 Other Self Harm Risks: none Triggers for Past Attempts: None known Intentional Self Injurious Behavior:  None Family Suicide History: No Recent stressful life event(s): Other (Comment)(none mentioned) Persecutory voices/beliefs?: No Depression: No Depression Symptoms: Insomnia Substance abuse history and/or treatment for substance abuse?: Yes Suicide prevention information given to non-admitted patients: Not applicable  Risk to Others within the past 6 months Homicidal Ideation: No Does patient have any lifetime risk of violence toward others beyond the six months prior to admission? : No Thoughts of Harm to Others: No Current Homicidal Intent: No Current Homicidal Plan: No Access to Homicidal Means: No Identified Victim: none History of harm to others?: No Assessment of Violence: None Noted Violent Behavior Description: none Does patient have access to weapons?: No Criminal Charges Pending?: No Does patient have a court date: No Is patient on probation?: No  Psychosis Hallucinations: None noted Delusions: Grandiose  Mental Status Report Appearance/Hygiene: In scrubs, Unremarkable Eye Contact: Good Motor Activity: Unremarkable, Freedom of movement Speech: Rapid, Pressured, Loud Level of Consciousness: Alert Affect: Euphoric Anxiety Level: None Thought Processes: Coherent, Relevant Judgement: Impaired Orientation: Person, Place, Time, Situation, Appropriate for developmental age Obsessive Compulsive Thoughts/Behaviors: None  Cognitive Functioning Concentration: Decreased Memory: Recent Intact, Remote Intact Is patient IDD: No Is patient DD?: No Insight: Poor Impulse Control: Poor Appetite: Good Have you had any weight changes? : No Change Sleep: Decreased Total Hours of Sleep: 1 Vegetative Symptoms: None  ADLScreening [facility] Assessment Services) Patient's cognitive ability adequate to safely complete daily activities?: Yes Patient able to express need for assistance with ADLs?: Yes Independently performs ADLs?: Yes (appropriate for developmental age)  Prior  Inpatient Therapy Prior Inpatient Therapy: Yes Prior Therapy Dates: 2017 Prior Therapy Facilty/Provider(s): [facility] Reason for Treatment: mania  Prior Outpatient Therapy Prior Outpatient Therapy: No Does patient have an ACCT team?: No Does patient have Intensive In-House Services?  : No Does patient have [facility] services? : No Does patient have P4CC services?: No  ADL Screening (condition at time of admission) Patient's cognitive ability adequate to safely complete daily activities?: Yes Patient able to express need for assistance with ADLs?: Yes Independently performs ADLs?: Yes (appropriate for developmental age)       Abuse/Neglect Assessment (Assessment to be complete while patient is alone) Abuse/Neglect Assessment Can Be Completed: Yes Physical Abuse: Denies Verbal Abuse: Denies Sexual Abuse: Denies Exploitation of patient/patient's resources: Denies Self-Neglect: Denies Values / Beliefs Cultural Requests During Hospitalization: None Spiritual Requests During Hospitalization: None Consults Spiritual Care Consult Needed: No Social Work Consult Needed: No            Disposition:  Disposition Initial Assessment Completed for this Encounter: Yes   NP Nira Conn recommends inpatient treatment.  RN Marcelino Duster was made aware of the recommendations.  On Site Evaluation by:   Reviewed with Physician:    Ottis Stain 09/10/2017 5:04 AM

## 2017-09-10 NOTE — ED Notes (Signed)
Bed: [facility] Expected date:  Expected time:  Means of arrival:  Comments: 

## 2017-09-10 NOTE — ED Triage Notes (Signed)
Pt presents with [employer] for evaluation after not taking pyschiatric medication for several weeks. Pt is anxious but cooperative at time of triage, pt reporting that people are stealing things from his hotel room. Pt denies any SI/HI. [facility] reports pt is here voluntarily.

## 2017-09-10 NOTE — BH Assessment (Signed)
[facility] Assessment Progress Note  Per Jacqueline Norman, DO, this pt does not require psychiatric hospitalization at this time.  [facility] is to be discharged from [facility] with recommendation to follow up with [facility].  This has been included in pt's discharge instructions.  Pt's nurse, Cynthia, has been notified.  Dillyn Menna, MA Triage Specialist [phone]     

## 2017-09-10 NOTE — ED Provider Notes (Signed)
[facility]-EMERGENCY DEPT Provider Note   CSN: [id number] Arrival date & time: 09/10/17  0137     History   Chief Complaint Chief Complaint  Patient presents with  . Medical Clearance    HPI [name] is a 57 y.o. male.  57 year old male with a history of cocaine abuse and bipolar affective disorder presents to the emergency department by [employer].  He is currently homeless, living in a hotel.  He has been off of his psychiatric medication for several weeks secondary to cost, though he also dislikes the effects of taking these medications.  He reports being increasingly anxious and expresses concern over people stealing things from his hotel room.  He states that someone stole his CPAP machine, so he is also not had this to use for a long time.  He has no suicidal or homicidal thoughts.  He is not currently followed by a psychiatric provider.  The history is provided by the patient. No language interpreter was used.    Past Medical History:  Diagnosis Date  . Bipolar 1 disorder [facility])     Patient Active Problem List   Diagnosis Date Noted  . Bipolar affective disorder in remission (HCC) 01/16/2016  . Cocaine use disorder, mild, abuse (HCC) 12/31/2015    Past Surgical History:  Procedure Laterality Date  . EYE SURGERY          Home Medications    Prior to Admission medications   Medication Sig Start Date End Date Taking? Authorizing Provider  hydrOXYzine (VISTARIL) 25 MG capsule Take 25 mg by mouth every 6 (six) hours as needed for anxiety.  07/24/17  Yes [provider]  lithium carbonate 300 MG capsule Take 300 mg by mouth daily. 07/24/17  Yes [provider]  QUEtiapine (SEROQUEL) 25 MG tablet Take 1 tablet (25 mg total) by mouth 2 (two) times daily at 8am and 2pm. 01/26/16  Yes Geiple, Ivin Booty, PA-C  QUEtiapine (SEROQUEL) 300 MG tablet Take 1 tablet (300 mg total) by mouth at bedtime. 01/26/16  Yes Renne Crigler, PA-C  traZODone  (DESYREL) 100 MG tablet Take 200 mg by mouth at bedtime. 08/02/17  Yes [provider]  amoxicillin (AMOXIL) 500 MG capsule Take 1 capsule (500 mg total) by mouth 3 (three) times daily. Patient not taking: Reported on 09/10/2017 07/20/17   Rolm Bookbinder, DO  lithium carbonate (ESKALITH) 450 MG CR tablet Take 1 tablet (450 mg total) by mouth daily with breakfast. Patient not taking: Reported on 09/10/2017 01/26/16   Renne Crigler, PA-C  oxyCODONE-acetaminophen (PERCOCET/ROXICET) 5-325 MG tablet Take 1 tablet by mouth every 4 (four) hours as needed for severe pain. Patient not taking: Reported on 09/10/2017 07/27/17   Rolm Bookbinder, DO    Family History Family History  Problem Relation Age of Onset  . Hyperlipidemia Mother   . Alcoholism Mother   . Suicidality Cousin     Social History Social History   Tobacco Use  . Smoking status: Never Smoker  . Smokeless tobacco: Never Used  Substance Use Topics  . Alcohol use: Yes    Alcohol/week: 0.0 oz    Comment: occasionally  . Drug use: No    Types: Cocaine    Comment: Used to be a cocaine user     Allergies   Medrol [methylprednisolone]   Review of Systems Review of Systems Ten systems reviewed and are negative for acute change, except as noted in the HPI.    Physical Exam Updated Vital Signs  BP 123/82 (BP Location: Right Arm)   Pulse 73   Temp 98.8 F (37.1 C) (Oral)   Resp 16   Ht  (1.778 m)   Wt 79.4 kg (175 lb)   SpO2 98%   BMI 25.11 kg/m   Physical Exam  Constitutional: He is oriented to person, place, and time. He appears well-developed and well-nourished. No distress.  HENT:  Head: Normocephalic and atraumatic.  Eyes: Conjunctivae and EOM are normal. No scleral icterus.  Neck: Normal range of motion.  Pulmonary/Chest: Effort normal. No respiratory distress.  Musculoskeletal: Normal range of motion.  Neurological: He is alert and oriented to person, place, and time. He exhibits normal muscle tone.  Coordination normal.  Skin: Skin is warm and dry. No rash noted. He is not diaphoretic. No erythema. No pallor.  Psychiatric: His mood appears anxious. His speech is rapid and/or pressured. He is hyperactive.  Nursing note and vitals reviewed.    ED Treatments / Results  Labs (all labs ordered are listed, but only abnormal results are displayed) Labs Reviewed  COMPREHENSIVE METABOLIC PANEL - Abnormal; Notable for the following components:      Result Value   CO2 20 (*)    Glucose, Bld 117 (*)    Creatinine, Ser 1.25 (*)    AST 44 (*)    All other components within normal limits  ETHANOL - Abnormal; Notable for the following components:   Alcohol, Ethyl (B) 14 (*)    All other components within normal limits  CBC WITH DIFFERENTIAL/PLATELET - Abnormal; Notable for the following components:   WBC 11.7 (*)    RBC 4.14 (*)    Hemoglobin 12.8 (*)    HCT 37.2 (*)    Neutro Abs 9.4 (*)    All other components within normal limits  LITHIUM LEVEL - Abnormal; Notable for the following components:   Lithium Lvl 0.32 (*)    All other components within normal limits  RAPID URINE DRUG SCREEN, HOSP PERFORMED    EKG None  Radiology No results found.  Procedures Procedures (including critical care time)  Medications Ordered in ED Medications - No data to display   Initial Impression / Assessment and Plan / ED Course  I have reviewed the triage vital signs and the nursing notes.  Pertinent labs & imaging results that were available during my care of the patient were reviewed by me and considered in my medical decision making (see chart for details).     57 year old male presents to the emergency department by [employer].  He is here voluntarily and reports being off of his psychiatric medications for the past few weeks.  He is visibly anxious with pressured speech.  No complaints of suicidal or homicidal thoughts, though he did request to be evaluated by TTS.  They have seen the patient  and recommend continued inpatient psychiatric care.  Pending bed availability.  Disposition to be determined by oncoming ED provider.   Final Clinical Impressions(s) / ED Diagnoses   Final diagnoses:  Bipolar 1 disorder, manic, moderate [facility])    ED Discharge Orders    None       Antony Madura, PA-C 09/10/17 1610    Glynn Octave, MD 09/10/17 [phone]

## 2017-09-10 NOTE — ED Notes (Signed)
Patient arrived to unit and is pleasant and cooperative with staff at this time. Patient denies any thoughts of suicide or homicide or plans to harm himself. No distress noted.   Patient presents with 5 bags of belongings which are secured on the unit.

## 2017-09-10 NOTE — B[facility] Suicide Risk Assessment (Signed)
Suicide Risk Assessment  Discharge Assessment   [facility] Discharge Suicide Risk Assessment   Principal Problem: Bipolar affective disorder in remission [facility]) Discharge Diagnoses:  Patient Active Problem List   Diagnosis Date Noted  . Bipolar affective disorder in remission (HCC) [F31.70] 01/16/2016  . Cocaine use disorder, mild, abuse (HCC) [F14.10] 12/31/2015   Pt was seen and chart reviewed with treatment team and Dr Sharma Covert. Pt denies suicidal/homicidal ideation, denies auditory/visual hallucinations and does not appear to be responding to internal stimuli. Pt stated he came to the [facility] because he was mad at people stealing from him at the hotel where he is staying. Pt is able to contract for safety upon discharge. Pt is stable and psychiatrically clear for discharge.  Total Time spent with patient: 30 minutes  Musculoskeletal: Strength & Muscle Tone: within normal limits Gait & Station: normal Patient leans: N/A  Psychiatric Specialty Exam:   Blood pressure 123/82, pulse 73, temperature 98.8 F (37.1 C), temperature source Oral, resp. rate 16, height  (1.778 m), weight 175 lb (79.4 kg), SpO2 98 %.Body mass index is 25.11 kg/m.  General Appearance: Casual  Eye Contact::  Good  Speech:  Pressured409  Volume:  Normal  Mood:  Anxious  Affect:  Congruent  Thought Process:  Coherent, Goal Directed and Linear  Orientation:  Full (Time, Place, and Person)  Thought Content:  Logical  Suicidal Thoughts:  No  Homicidal Thoughts:  No  Memory:  Immediate;   Good Recent;   Good Remote;   Fair  Judgement:  Fair  Insight:  Fair  Psychomotor Activity:  Increased  Concentration:  Good  Recall:  Good  Fund of Knowledge:Good  Language: Good  Akathisia:  No  Handed:  Right  AIMS (if indicated):     Assets:  Architect Housing Vocational/Educational  Sleep:     Cognition: WNL  ADL's:  Intact   Mental Status Per Nursing Assessment::   On  Admission:   Anxious  Demographic Factors:  Male, Caucasian and Low socioeconomic status  Loss Factors: Financial problems/change in socioeconomic status  Historical Factors: Impulsivity  Risk Reduction Factors:   Sense of responsibility to family  Continued Clinical Symptoms:  Severe Anxiety and/or Agitation Depression:   Impulsivity Alcohol/Substance Abuse/Dependencies  Cognitive Features That Contribute To Risk:  Closed-mindedness    Suicide Risk:  Minimal: No identifiable suicidal ideation.  Patients presenting with no risk factors but with morbid ruminations; may be classified as minimal risk based on the severity of the depressive symptoms    Plan Of Care/Follow-up recommendations:  Activity:  as tolerated Diet:  Heart Healthy  Laveda Abbe, NP 09/10/2017, 11:45 AM

## 2017-09-10 NOTE — Discharge Instructions (Signed)
For your mental health needs, you are advised to follow up with [facility].  New and returning patients are seen at their walk-in clinic.  Walk-in hours are Monday - Friday from 8:00 am - 3:00 pm.  Walk-in patients are seen on a first come, first served basis.  Try to arrive as early as possible for he best chance of being seen the same day: ° °     [facility] °     [address] °     , [place] [address] °     [phone] °

## 2017-09-13 ENCOUNTER — Other Ambulatory Visit: Payer: Self-pay

## 2017-09-13 ENCOUNTER — Emergency Department (HOSPITAL_COMMUNITY)
Admission: EM | Admit: 2017-09-13 | Discharge: 2017-09-14 | Disposition: A | Discharge status: Home / Self Care | Payer: 59 | Attending: Internal Medicine | Admitting: Internal Medicine

## 2017-09-13 ENCOUNTER — Encounter (HOSPITAL_COMMUNITY): Payer: Self-pay | Admitting: Emergency Medicine

## 2017-09-13 DIAGNOSIS — F309 Manic episode, unspecified: Secondary | ICD-10-CM | POA: Diagnosis not present

## 2017-09-13 DIAGNOSIS — F329 Major depressive disorder, single episode, unspecified: Secondary | ICD-10-CM | POA: Diagnosis not present

## 2017-09-13 DIAGNOSIS — Z046 Encounter for general psychiatric examination, requested by authority: Secondary | ICD-10-CM | POA: Diagnosis present

## 2017-09-13 DIAGNOSIS — Z008 Encounter for other general examination: Secondary | ICD-10-CM

## 2017-09-13 DIAGNOSIS — F419 Anxiety disorder, unspecified: Secondary | ICD-10-CM | POA: Insufficient documentation

## 2017-09-13 DIAGNOSIS — F32A Depression, unspecified: Secondary | ICD-10-CM | POA: Diagnosis present

## 2017-09-13 DIAGNOSIS — Z818 Family history of other mental and behavioral disorders: Secondary | ICD-10-CM | POA: Diagnosis not present

## 2017-09-13 LAB — COMPREHENSIVE METABOLIC PANEL
ALK PHOS: 71 U/L (ref 38–126)
ALT: 46 U/L (ref 17–63)
ANION GAP: 11 (ref 5–15)
AST: 84 U/L — ABNORMAL HIGH (ref 15–41)
Albumin: 4.5 g/dL (ref 3.5–5.0)
BILIRUBIN TOTAL: 1.5 mg/dL — AB (ref 0.3–1.2)
BUN: 21 mg/dL — ABNORMAL HIGH (ref 6–20)
CALCIUM: 9.4 mg/dL (ref 8.9–10.3)
CO2: 21 mmol/L — AB (ref 22–32)
CREATININE: 1.32 mg/dL — AB (ref 0.61–1.24)
Chloride: 109 mmol/L (ref 101–111)
GFR calc non Af Amer: 60 mL/min — ABNORMAL LOW (ref >60)
Glucose, Bld: 120 mg/dL — ABNORMAL HIGH (ref 65–99)
Potassium: 3.6 mmol/L (ref 3.5–5.1)
SODIUM: 141 mmol/L (ref 135–145)
TOTAL PROTEIN: 7.7 g/dL (ref 6.5–8.1)

## 2017-09-13 LAB — CBC
HEMATOCRIT: 35.8 % — AB (ref 39.0–52.0)
HEMOGLOBIN: 12.3 g/dL — AB (ref 13.0–17.0)
MCH: 30.9 pg (ref 26.0–34.0)
MCHC: 34.4 g/dL (ref 30.0–36.0)
MCV: 89.9 fL (ref 78.0–100.0)
Platelets: 291 10*3/uL (ref 150–400)
RBC: 3.98 MIL/uL — ABNORMAL LOW (ref 4.22–5.81)
RDW: 13.1 % (ref 11.5–15.5)
WBC: 14.6 10*3/uL — ABNORMAL HIGH (ref 4.0–10.5)

## 2017-09-13 LAB — SALICYLATE LEVEL

## 2017-09-13 LAB — RAPID URINE DRUG SCREEN, HOSP PERFORMED
Amphetamines: NOT DETECTED
BARBITURATES: NOT DETECTED
Benzodiazepines: NOT DETECTED
Cocaine: NOT DETECTED
Opiates: NOT DETECTED
TETRAHYDROCANNABINOL: NOT DETECTED

## 2017-09-13 LAB — ETHANOL: Alcohol, Ethyl (B): <10 mg/dL (ref <10)

## 2017-09-13 LAB — ACETAMINOPHEN LEVEL

## 2017-09-13 MED ORDER — ACETAMINOPHEN 325 MG PO TABS: 650.0000 mg | ORAL_TABLET | ORAL | Status: DC | PRN

## 2017-09-13 MED ORDER — ASENAPINE MALEATE 5 MG SL SUBL
10.0000 mg | SUBLINGUAL_TABLET | Freq: Two times a day (BID) | SUBLINGUAL | Status: DC
  Administered 2017-09-13 – 2017-09-14 (×2): 10 mg via SUBLINGUAL
  Filled 2017-09-13 (×3): qty 2

## 2017-09-13 MED ORDER — CEPHALEXIN 500 MG PO CAPS
500.0000 mg | ORAL_CAPSULE | Freq: Four times a day (QID) | ORAL | Status: DC
  Administered 2017-09-13 – 2017-09-14 (×4): 500 mg via ORAL
  Filled 2017-09-13 (×4): qty 1

## 2017-09-13 MED ORDER — HYDROXYZINE HCL 25 MG PO TABS: 25.0000 mg | ORAL_TABLET | Freq: Four times a day (QID) | ORAL | Status: DC | PRN

## 2017-09-13 NOTE — ED Triage Notes (Signed)
Pt brought in voluntary by [employer]; pt flight of ideas with triage; "mom passed away last night; I need a list of my medicine; I need the room dark and the tv on; let me know the angers I will have with new medicine." Pt rapid speech.

## 2017-09-13 NOTE — ED Notes (Signed)
Patient noted to have thrown trash and food in his floor and on his bed. Patient asked by writer to clean up all trash and dispose of it properly and to keep his room tidy. Patient did as instructed and removed his dirty linens. Pt with no signs of distress at this time.

## 2017-09-13 NOTE — ED Notes (Signed)
Bed: [facility] Expected date:  Expected time:  Means of arrival:  Comments: 

## 2017-09-13 NOTE — ED Notes (Signed)
Law PA at bedside  

## 2017-09-13 NOTE — ED Provider Notes (Signed)
[facility]-EMERGENCY DEPT Provider Note   CSN: [id number] Arrival date & time: 09/13/17  4098     History   Chief Complaint Chief Complaint  Patient presents with  . Medical Clearance    HPI [name] is a 57 y.o. male with history of bipolar 1 disorder, cocaine use who presents with CPD voluntarily for psychiatric evaluation.  Patient reports she has just been frustrated.  He reports his mother died yesterday.  He denies suicidal or homicidal thoughts.  He denies hallucinations.  He has blisters on his feet, however denies pain elsewhere.  He requests to speak with a counselor today.  He has not been taking his medications as prescribed for 3 years.  He denies any chest pain, shortness of breath, abdominal pain, nausea, vomiting.  He reports he occasionally drinks beer, but denies drug use in several months.  HPI  Past Medical History:  Diagnosis Date  . Bipolar 1 disorder [facility])     Patient Active Problem List   Diagnosis Date Noted  . Bipolar affective disorder in remission (HCC) 01/16/2016  . Cocaine use disorder, mild, abuse (HCC) 12/31/2015    Past Surgical History:  Procedure Laterality Date  . EYE SURGERY          Home Medications    Prior to Admission medications   Medication Sig Start Date End Date Taking? Authorizing Provider  hydrOXYzine (VISTARIL) 25 MG capsule Take 25 mg by mouth every 6 (six) hours as needed for anxiety.  07/24/17  Yes [provider]  lithium carbonate 300 MG capsule Take 300 mg by mouth daily. 07/24/17  Yes [provider]  QUEtiapine (SEROQUEL) 25 MG tablet Take 1 tablet (25 mg total) by mouth 2 (two) times daily at 8am and 2pm. 01/26/16  Yes Geiple, Ivin Booty, PA-C  QUEtiapine (SEROQUEL) 300 MG tablet Take 1 tablet (300 mg total) by mouth at bedtime. 01/26/16  Yes Renne Crigler, PA-C  traZODone (DESYREL) 100 MG tablet Take 200 mg by mouth at bedtime. 08/02/17  Yes [provider]    amoxicillin (AMOXIL) 500 MG capsule Take 1 capsule (500 mg total) by mouth 3 (three) times daily. Patient not taking: Reported on 09/10/2017 07/20/17   Rolm Bookbinder, DO  lithium carbonate (ESKALITH) 450 MG CR tablet Take 1 tablet (450 mg total) by mouth daily with breakfast. Patient not taking: Reported on 09/10/2017 01/26/16   Renne Crigler, PA-C  oxyCODONE-acetaminophen (PERCOCET/ROXICET) 5-325 MG tablet Take 1 tablet by mouth every 4 (four) hours as needed for severe pain. Patient not taking: Reported on 09/10/2017 07/27/17   Rolm Bookbinder, DO    Family History Family History  Problem Relation Age of Onset  . Hyperlipidemia Mother   . Alcoholism Mother   . Suicidality Cousin     Social History Social History   Tobacco Use  . Smoking status: Never Smoker  . Smokeless tobacco: Never Used  Substance Use Topics  . Alcohol use: Yes    Alcohol/week: 0.0 oz    Comment: occasionally  . Drug use: No    Types: Cocaine    Comment: Used to be a cocaine user     Allergies   Medrol [methylprednisolone]   Review of Systems Review of Systems  Constitutional: Negative for chills and fever.  HENT: Negative for facial swelling and sore throat.   Respiratory: Negative for shortness of breath.   Cardiovascular: Negative for chest pain.  Gastrointestinal: Negative for abdominal pain, nausea and vomiting.  Genitourinary: Negative for  dysuria.  Musculoskeletal: Negative for back pain.  Skin: Positive for wound (blisters on feet). Negative for rash.  Neurological: Negative for headaches.  Psychiatric/Behavioral: Negative for hallucinations and suicidal ideas. The patient is not nervous/anxious.      Physical Exam Updated Vital Signs BP (!) 161/97 (BP Location: Right Arm)   Pulse 82   Temp 98.1 F (36.7 C) (Oral)   Resp 20   Ht  (1.803 m)   Wt 79.4 kg (175 lb)   SpO2 97%   BMI 24.41 kg/m   Physical Exam  Constitutional: He appears well-developed and well-nourished. No  distress.  HENT:  Head: Normocephalic and atraumatic.  Mouth/Throat: Oropharynx is clear and moist. No oropharyngeal exudate.  Eyes: Pupils are equal, round, and reactive to light. Conjunctivae are normal. Right eye exhibits no discharge. Left eye exhibits no discharge. No scleral icterus.  Neck: Normal range of motion. Neck supple. No thyromegaly present.  Cardiovascular: Normal rate, regular rhythm, normal heart sounds and intact distal pulses. Exam reveals no gallop and no friction rub.  No murmur heard. Pulmonary/Chest: Effort normal and breath sounds normal. No stridor. No respiratory distress. He has no wheezes. He has no rales.  Abdominal: Soft. Bowel sounds are normal. He exhibits no distension. There is no tenderness. There is no rebound and no guarding.  Musculoskeletal: He exhibits no edema.  Lymphadenopathy:    He has no cervical adenopathy.  Neurological: He is alert. Coordination normal.  Skin: Skin is warm and dry. No rash noted. He is not diaphoretic. No pallor.  ~4 cm diameter clear fluid filled blister on the R plantar aspect at metatarsal of great toe  Psychiatric: His mood appears anxious. His speech is rapid and/or pressured and tangential. He is not actively hallucinating. He expresses no homicidal and no suicidal ideation. He expresses no suicidal plans and no homicidal plans.  Flight of ideas, rapid speech  Nursing note and vitals reviewed.    ED Treatments / Results  Labs (all labs ordered are listed, but only abnormal results are displayed) Labs Reviewed  COMPREHENSIVE METABOLIC PANEL - Abnormal; Notable for the following components:      Result Value   CO2 21 (*)    Glucose, Bld 120 (*)    BUN 21 (*)    Creatinine, Ser 1.32 (*)    AST 84 (*)    Total Bilirubin 1.5 (*)    GFR calc non Af Amer 60 (*)    All other components within normal limits  ACETAMINOPHEN LEVEL - Abnormal; Notable for the following components:   Acetaminophen (Tylenol), Serum <10 (*)     All other components within normal limits  CBC - Abnormal; Notable for the following components:   WBC 14.6 (*)    RBC 3.98 (*)    Hemoglobin 12.3 (*)    HCT 35.8 (*)    All other components within normal limits  ETHANOL  SALICYLATE LEVEL  RAPID URINE DRUG SCREEN, HOSP PERFORMED  LITHIUM LEVEL    EKG None  Radiology No results found.  Procedures Procedures (including critical care time)  Medications Ordered in ED Medications  acetaminophen (TYLENOL) tablet 650 mg (has no administration in time range)  hydrOXYzine (ATARAX/VISTARIL) tablet 25 mg (has no administration in time range)  asenapine (SAPHRIS) sublingual tablet 10 mg (has no administration in time range)  cephALEXin (KEFLEX) capsule 500 mg (has no administration in time range)     Initial Impression / Assessment and Plan / ED Course  I have  reviewed the triage vital signs and the nursing notes.  Pertinent labs & imaging results that were available during my care of the patient were reviewed by me and considered in my medical decision making (see chart for details).     Patient is appearing to be manic, flight of ideas rapid and pressured speech present.  Patient denies suicidal homicidal ideations, however he requests to speak with TTS today.  Patient with mild elevation in white blood cell count. Could be non-specific or related to patient's reported EtOH use, but considering blisters on feet, will cover with Keflex. Blisters left intact. No other signs of infection.  CMP shows AST, total bilirubin 1.5.  Most likely related to patient's reported alcohol use.  Ethanol, salicylate, acetaminophen level negative.  Patient is medically cleared.  Monitor blisters.  AM psych eval recommended by TTS.  Final Clinical Impressions(s) / ED Diagnoses   Final diagnoses:  Medical clearance for psychiatric admission  [facility])    ED Discharge Orders    None       Emi Holes, PA-C 09/13/17 1515    Lorre Nick, MD 09/14/17 1732
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
## 2017-09-13 NOTE — BH Assessment (Signed)
Assessment Note  [name] is an 57 y.o. male. Pt reports SI and HI with a plan to blow up the world. Pt is delusional. Pt states he has been smoking crack and using methamphetamine with [employer] of the [place]. The Pt states he is angry with [place] because they are holding his sons hostage. Pt was poor historian. Pt had flight of ideas and had tangential speech. Pt states "I have not had my medication." Pt could not recall the name of his medications.  Pt was yelling and screaming. Pt appeared to be manic.   Dr. Sharma Covert recommends am psych evaluation to assess safety and stabilization.   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 Bipolar 1 disorder [facility])     Past Surgical History:  Procedure Laterality Date  . EYE SURGERY      Family History:  Family History  Problem Relation Age of Onset  . Hyperlipidemia Mother   . Alcoholism Mother   . Suicidality Cousin     Social History:  reports that he has never smoked. He has never used smokeless tobacco. He reports that he drinks alcohol. He reports that he does not use drugs.  Additional Social History:  Alcohol / Drug Use Pain Medications: please see mar Prescriptions: please see mar Over the Counter: please see mar History of alcohol / drug use?: No history of alcohol / drug abuse Longest period of sobriety (when/how long): NA  CIWA: CIWA-Ar BP: (!) 161/97 Pulse Rate: 82 COWS:    Allergies:  Allergies  Allergen Reactions  . Medrol [Methylprednisolone] Rash    Home Medications:  (Not in a hospital admission)  OB/GYN Status:  No LMP for male patient.  General Assessment Data Location of Assessment: [facility] ED TTS Assessment: In system Is this a Tele or Face-to-Face Assessment?: Tele Assessment Is this an Initial Assessment or a Re-assessment for this encounter?: Initial Assessment Marital status: Married [place] name: NA Is  patient pregnant?: No Pregnancy Status: No Living Arrangements: (hotel) Can pt return to current living arrangement?: Yes Admission Status: Voluntary Is patient capable of signing voluntary admission?: Yes Referral Source: Self/Family/Friend Insurance type: [place]     Crisis Care Plan Living Arrangements: (hotel) Legal Guardian: Other:(self) Name of Psychiatrist: NA Name of Therapist: NA  Education Status Is patient currently in school?: No Is the patient employed, unemployed or receiving disability?: Employed  Risk to self with the past 6 months Suicidal Ideation: Yes-Currently Present Has patient been a risk to self within the past 6 months prior to admission? : No Suicidal Intent: Yes-Currently Present Has patient had any suicidal intent within the past 6 months prior to admission? : No Is patient at risk for suicide?: Yes Suicidal Plan?: Yes-Currently Present Has patient had any suicidal plan within the past 6 months prior to admission? : No Specify Current Suicidal Plan: to blow up the world Access to Means: No What has been your use of drugs/alcohol within the last 12 months?: Pt denies Previous Attempts/Gestures: No How many times?: 0 Other Self Harm Risks: NA Triggers for Past Attempts: None known Intentional Self Injurious Behavior: None Family Suicide History: No Recent stressful life event(s): Other (Comment)(children will not talk to him) Persecutory voices/beliefs?: No Depression: No Depression Symptoms: Insomnia, Feeling angry/irritable, Feeling worthless/self pity Substance abuse history and/or treatment for substance abuse?: No Suicide prevention information given to non-admitted patients: Not applicable  Risk to Others within  the past 6 months Homicidal Ideation: Yes-Currently Present Does patient have any lifetime risk of violence toward others beyond the six months prior to admission? : No Thoughts of Harm to Others: No Current Homicidal Intent:  No Current Homicidal Plan: Yes-Currently Present Describe Current Homicidal Plan: to blow up the workd Access to Homicidal Means: No Identified Victim: NA History of harm to others?: No Assessment of Violence: None Noted Violent Behavior Description: NA Does patient have access to weapons?: No Criminal Charges Pending?: No Does patient have a court date: No Is patient on probation?: No  Psychosis Hallucinations: None noted Delusions: None noted  Mental Status Report Appearance/Hygiene: In scrubs, Unremarkable Eye Contact: Good Motor Activity: Freedom of movement Speech: Rapid, Pressured, Loud Level of Consciousness: Alert Mood: Euphoric Affect: Appropriate to circumstance Anxiety Level: Minimal Thought Processes: Tangential, Flight of Ideas Judgement: Impaired Orientation: Situation, Time Obsessive Compulsive Thoughts/Behaviors: None  Cognitive Functioning Concentration: Decreased Memory: Recent Impaired, Remote Impaired Is patient IDD: No Is patient DD?: No Insight: Poor Impulse Control: Poor Appetite: Fair Have you had any weight changes? : No Change Sleep: Decreased Total Hours of Sleep: 6 Vegetative Symptoms: None  ADLScreening [facility] Assessment Services) Patient's cognitive ability adequate to safely complete daily activities?: Yes Patient able to express need for assistance with ADLs?: Yes Independently performs ADLs?: Yes (appropriate for developmental age)  Prior Inpatient Therapy Prior Inpatient Therapy: Yes Prior Therapy Dates: 2017 Prior Therapy Facilty/Provider(s): [facility] Reason for Treatment: mania  Prior Outpatient Therapy Prior Outpatient Therapy: No Does patient have an ACCT team?: No Does patient have Intensive In-House Services?  : No Does patient have [facility] services? : No Does patient have P4CC services?: No  ADL Screening (condition at time of admission) Patient's cognitive ability adequate to safely complete daily activities?:  Yes Is the patient deaf or have difficulty hearing?: No Does the patient have difficulty seeing, even when wearing glasses/contacts?: No Does the patient have difficulty concentrating, remembering, or making decisions?: No Patient able to express need for assistance with ADLs?: Yes Does the patient have difficulty dressing or bathing?: No Independently performs ADLs?: Yes (appropriate for developmental age)       Abuse/Neglect Assessment (Assessment to be complete while patient is alone) Abuse/Neglect Assessment Can Be Completed: Yes Physical Abuse: Denies Verbal Abuse: Denies Sexual Abuse: Denies Exploitation of patient/patient's resources: Denies     Merchant navy officer (For Healthcare) Does Patient Have a Medical Advance Directive?: No Would patient like information on creating a medical advance directive?: No - Patient declined    Additional Information 1:1 In Past 12 Months?: No CIRT Risk: No Elopement Risk: No Does patient have medical clearance?: Yes     Disposition:  Disposition Initial Assessment Completed for this Encounter: Yes  On Site Evaluation by:   Reviewed with Physician:    Wolfgang Phoenix D 09/13/2017 11:11 AM

## 2017-09-14 DIAGNOSIS — F329 Major depressive disorder, single episode, unspecified: Secondary | ICD-10-CM | POA: Diagnosis present

## 2017-09-14 DIAGNOSIS — F419 Anxiety disorder, unspecified: Secondary | ICD-10-CM | POA: Diagnosis not present

## 2017-09-14 DIAGNOSIS — Z818 Family history of other mental and behavioral disorders: Secondary | ICD-10-CM

## 2017-09-14 MED ORDER — QUETIAPINE FUMARATE 100 MG PO TABS: 100.0000 mg | ORAL_TABLET | Freq: Every day | ORAL | 0 refills | Status: DC

## 2017-09-14 MED ORDER — LITHIUM CARBONATE 300 MG PO TABS: 300.0000 mg | ORAL_TABLET | Freq: Two times a day (BID) | ORAL | 0 refills | Status: DC

## 2017-09-14 MED ORDER — CEPHALEXIN 500 MG PO CAPS: 500.0000 mg | ORAL_CAPSULE | Freq: Four times a day (QID) | ORAL | 0 refills | Status: DC

## 2017-09-14 NOTE — ED Notes (Signed)
Up to the bathroom 

## 2017-09-14 NOTE — Consult Note (Signed)
[facility]  09/14/2017 1:26 PM [name]  MRN:  [id number] Principal Problem: Anxiety and depression Discharge Diagnoses:  Patient Active Problem List   Diagnosis Date Noted  . Bipolar affective disorder in remission (HCC) [F31.70] 01/16/2016  . Cocaine use disorder, mild, abuse (HCC) [F14.10] 12/31/2015    Subjective: [name] was admitted with concern for manic symptoms. He has rapid speech on interview but he is not pressured. He is relatively organized in thought process although appears anxious in the setting of multiple stressors including the loss of his mother two days ago. He was fired from his job. He has not seen his children in 10 years since they moved after his wife committed suicide. He denies SI, HI or AVH. He denies problems with sleeping. He has not been taking his medications although he is agreeable to restarting them.   Total Time spent with patient: 45 minutes  Past Psychiatric History: Bipolar 1 disorder   Past Medical History:  Past Medical History:  Diagnosis Date  . Bipolar 1 disorder [facility](HCC)    Past Surgical History:  Procedure Laterality Date  . EYE SURGERY     Family History:  Family History  Problem Relation Age of Onset  . Hyperlipidemia Mother   . Alcoholism Mother   . Suicidality Cousin    Family Psychiatric  History: Mother-alcoholism. Social History:  Social History   Substance and Sexual Activity  Alcohol Use Yes  . Alcohol/week: 0.0 oz   Comment: occasionally    Social History   Substance and Sexual Activity  Drug Use No  . Types: Cocaine   Comment: Used to be a cocaine user   Social History   Socioeconomic History  . Marital status: Single    Spouse name: Not on file  . Number of children: Not on file  . Years of education: Not on file  . Highest education level: Not on file  Occupational History  . Not on file  Social Needs  . Financial resource strain: Not on file  . Food insecurity:    Worry: Not  on file    Inability: Not on file  . Transportation needs:    Medical: Not on file    Non-medical: Not on file  Tobacco Use  . Smoking status: Never Smoker  . Smokeless tobacco: Never Used  Substance and Sexual Activity  . Alcohol use: Yes    Alcohol/week: 0.0 oz    Comment: occasionally  . Drug use: No    Types: Cocaine    Comment: Used to be a cocaine user  . Sexual activity: Not on file  Lifestyle  . Physical activity:    Days per week: Not on file    Minutes per session: Not on file  . Stress: Not on file  Relationships  . Social connections:    Talks on phone: Not on file    Gets together: Not on file    Attends religious service: Not on file    Active member of club or organization: Not on file    Attends meetings of clubs or organizations: Not on file    Relationship status: Not on file  Other Topics Concern  . Not on file  Social History Narrative  . Not on file    Has this patient used any form of tobacco in the last 30 days? (Cigarettes, Smokeless Tobacco, Cigars, and/or Pipes) Prescription not provided because: Patient does not use tobacco.  Current Medications: Current Facility-Administered Medications  Medication Dose Route Frequency Provider Last Rate Last Dose  . acetaminophen (TYLENOL) tablet 650 mg  650 mg Oral Q4H PRN Law, Alexandra M, PA-C      . asenapine (SAPHRIS) sublingual tablet 10 mg  10 mg Sublingual BID Charm Rings, NP   10 mg at 09/14/17 0957  . cephALEXin (KEFLEX) capsule 500 mg  500 mg Oral Q6H Law, Alexandra M, PA-C   500 mg at 09/14/17 1254  . hydrOXYzine (ATARAX/VISTARIL) tablet 25 mg  25 mg Oral Q6H PRN Emi Holes, PA-C       Current Outpatient Medications  Medication Sig Dispense Refill  . hydrOXYzine (VISTARIL) 25 MG capsule Take 25 mg by mouth every 6 (six) hours as needed for anxiety.     Marland Kitchen lithium carbonate 300 MG capsule Take 300 mg by mouth daily.    . QUEtiapine (SEROQUEL) 25 MG tablet Take 1 tablet (25 mg total) by  mouth 2 (two) times daily at 8am and 2pm. 6 tablet 0  . QUEtiapine (SEROQUEL) 300 MG tablet Take 1 tablet (300 mg total) by mouth at bedtime. 3 tablet 0  . traZODone (DESYREL) 100 MG tablet Take 200 mg by mouth at bedtime.    Marland Kitchen amoxicillin (AMOXIL) 500 MG capsule Take 1 capsule (500 mg total) by mouth 3 (three) times daily. (Patient not taking: Reported on 09/10/2017) 21 capsule 0  . lithium carbonate (ESKALITH) 450 MG CR tablet Take 1 tablet (450 mg total) by mouth daily with breakfast. (Patient not taking: Reported on 09/10/2017) 3 tablet 0  . oxyCODONE-acetaminophen (PERCOCET/ROXICET) 5-325 MG tablet Take 1 tablet by mouth every 4 (four) hours as needed for severe pain. (Patient not taking: Reported on 09/10/2017) 5 tablet 0   PTA Medications:  (Not in a hospital admission)  Musculoskeletal: Strength & Muscle Tone: within normal limits Gait & Station: normal Patient leans: N/A  Psychiatric Specialty Exam: Physical Exam  Nursing note and vitals reviewed. Constitutional: He is oriented to person, place, and time. He appears well-developed and well-nourished.  HENT:  Head: Normocephalic and atraumatic.  Neck: Normal range of motion.  Respiratory: Effort normal.  Musculoskeletal: Normal range of motion.  Neurological: He is alert and oriented to person, place, and time.  Skin: No rash noted.  Psychiatric: He has a normal mood and affect. His speech is normal and behavior is normal. Judgment and thought content normal. Cognition and memory are normal.    Review of Systems  Psychiatric/Behavioral: Positive for depression. Negative for hallucinations, substance abuse and suicidal ideas. The patient is nervous/anxious.   All other systems reviewed and are negative.   Blood pressure (!) 149/91, pulse (!) 58, temperature 98.1 F (36.7 C), resp. rate 20, height 5\' 11"  (1.803 m), weight 79.4 kg (175 lb), SpO2 99 %.Body mass index is 24.41 kg/m.  General Appearance: Fairly Groomed, middle  aged, Caucasian male, wearing paper hospital scrubs and lying in bed. NAD.   Eye Contact:  Good  Speech:  Clear and Coherent and increased rate but not pressured.  Volume:  Normal  Mood:  Anxious  Affect:  Congruent  Thought Process:  Goal Directed, Linear and Descriptions of Associations: Intact  Orientation:  Full (Time, Place, and Person)  Thought Content:  Logical  Suicidal Thoughts:  No  Homicidal Thoughts:  No  Memory:  Immediate;   Good Recent;   Good Remote;   Good  Judgement:  Fair  Insight:  Fair  Psychomotor Activity:  Normal  Concentration:  Concentration: Good and  Attention Span: Good  Recall:  Good  Fund of Knowledge:  Good  Language:  Good  Akathisia:  No  Handed:  Right  AIMS (if indicated):   N/A  Assets:  Communication Skills Housing  ADL's:  Intact  Cognition:  WNL  Sleep:   Okay     Demographic Factors:  Male and Living alone  Loss Factors: Loss of significant relationship  Historical Factors: Family history of suicide  Risk Reduction Factors:   Positive coping skills or problem solving skills  Continued Clinical Symptoms:  Bipolar Disorder:   Depressive phase  Cognitive Features That Contribute To Risk:  Thought constriction (tunnel vision)    Suicide Risk:  Minimal: No identifiable suicidal ideation.  Patients presenting with no risk factors but with morbid ruminations; may be classified as minimal risk based on the severity of the depressive symptoms  Assessment:  [name] is a 57 y.o. male who was admitted with concern for manic symptoms. He is relatively organized in thought process with rapid but non pressured speech today. He slept well overnight. He denies SI, HI or AVH. He does not warrant inpatient psychiatric hospitalization and will follow up with his outpatient provider.   Plan Of Care/Follow-up recommendations:  -Restart home medications: Lithium 300 mg BID for mood stabilization and Seroquel 100 mg qhs for mood  augmentation and sleep.  -Patient educated to drink water given elevated creatinine at this visit. -Patient will follow up with his outpatient provider.   Disposition: Discharge to home.  Cherly Beach, DO 09/14/2017, 1:26 PM

## 2017-09-14 NOTE — ED Notes (Addendum)
Patient with multiple blisters on feet.  Blister rt great toe intact, blisters x2 lt foot resolving not redness/swellibng drainage noted.

## 2017-09-14 NOTE — ED Notes (Addendum)
Written dc instructions and prescriptions reviewed with pt.  Pt encouraged to take medications as directed, drink plenty of fluids and follow up with [facility] as soon as possible, pt encouraged to seek treatment for any changes.  Pt also encouraged to keep his feet clean and dry and monitor for signs of infection (redness/swelling/pus/drainage).  Pt verbalized understanding.

## 2017-09-14 NOTE — ED Notes (Addendum)
Pt ambulatory w/o difficulty to dc area, belongings returned after leaving the area, bus pass given.

## 2017-09-14 NOTE — Discharge Instructions (Signed)
For your mental health needs, you are advised to continue treatment with [facility]: ° °     [facility] °     [address] °     [place], [place] [address] °     [phone]

## 2017-09-14 NOTE — BH Assessment (Signed)
[facility] Assessment Progress Note  Per Juanetta Beets, DO, this pt does not require psychiatric hospitalization at this time.  Pt is to be discharged from [facility] with recommendation to continue treatment with [facility].  This has been included in pt's discharge instructions.  Pt's nurse, Wille Celeste, has been notified.  Doylene Canning, MA Triage Specialist [phone]

## 2017-09-14 NOTE — ED Notes (Signed)
Dr Sharma Covert into see. Pt  Denies current si/hi/avh and reports that he feels that he is safe to go home.  Pt reports that his wife completed suicide in 2005 and he has not been able to see his children in over 10 yrs.  Pt reports that he had been living with his mother and she died 2 days ago.  Pt reports that he is dealing well with her death, but needs to go back the the home to get his belongings.  Pt reports that he has been in and out of the hospital since he was diagnosed at 57yrs old.  PT reports that he goes to [facility] and was there to see the psych w/o the last several days, but has not been able to pick up his medications.  Pt reports that he has been off of his meds for months.

## 2017-09-17 ENCOUNTER — Emergency Department (HOSPITAL_COMMUNITY)
Admission: EM | Admit: 2017-09-17 | Discharge: 2017-09-17 | Disposition: A | Discharge status: Home / Self Care | Payer: 59 | Attending: Emergency Medicine | Admitting: Emergency Medicine

## 2017-09-17 ENCOUNTER — Other Ambulatory Visit: Payer: Self-pay

## 2017-09-17 ENCOUNTER — Encounter (HOSPITAL_COMMUNITY): Payer: Self-pay | Admitting: *Deleted

## 2017-09-17 DIAGNOSIS — F3110 Bipolar disorder, current episode manic without psychotic features, unspecified: Secondary | ICD-10-CM | POA: Insufficient documentation

## 2017-09-17 DIAGNOSIS — F1010 Alcohol abuse, uncomplicated: Secondary | ICD-10-CM

## 2017-09-17 DIAGNOSIS — F319 Bipolar disorder, unspecified: Secondary | ICD-10-CM

## 2017-09-17 LAB — CBC
HCT: 36.6 % — ABNORMAL LOW (ref 39.0–52.0)
Hemoglobin: 12.3 g/dL — ABNORMAL LOW (ref 13.0–17.0)
MCH: 30.4 pg (ref 26.0–34.0)
MCHC: 33.6 g/dL (ref 30.0–36.0)
MCV: 90.6 fL (ref 78.0–100.0)
PLATELETS: 312 10*3/uL (ref 150–400)
RBC: 4.04 MIL/uL — AB (ref 4.22–5.81)
RDW: 13 % (ref 11.5–15.5)
WBC: 8.9 10*3/uL (ref 4.0–10.5)

## 2017-09-17 LAB — COMPREHENSIVE METABOLIC PANEL
ALBUMIN: 4.4 g/dL (ref 3.5–5.0)
ALT: 41 U/L (ref 17–63)
AST: 55 U/L — AB (ref 15–41)
Alkaline Phosphatase: 79 U/L (ref 38–126)
Anion gap: 10 (ref 5–15)
BUN: 13 mg/dL (ref 6–20)
CHLORIDE: 108 mmol/L (ref 101–111)
CO2: 22 mmol/L (ref 22–32)
Calcium: 9.1 mg/dL (ref 8.9–10.3)
Creatinine, Ser: 0.98 mg/dL (ref 0.61–1.24)
GFR calc Af Amer: >60 mL/min (ref >60)
GFR calc non Af Amer: >60 mL/min (ref >60)
Glucose, Bld: 107 mg/dL — ABNORMAL HIGH (ref 65–99)
POTASSIUM: 3.7 mmol/L (ref 3.5–5.1)
Sodium: 140 mmol/L (ref 135–145)
Total Bilirubin: 0.5 mg/dL (ref 0.3–1.2)
Total Protein: 7.9 g/dL (ref 6.5–8.1)

## 2017-09-17 LAB — RAPID URINE DRUG SCREEN, HOSP PERFORMED
AMPHETAMINES: NOT DETECTED
BENZODIAZEPINES: NOT DETECTED
Barbiturates: NOT DETECTED
COCAINE: NOT DETECTED
OPIATES: NOT DETECTED
Tetrahydrocannabinol: NOT DETECTED

## 2017-09-17 LAB — ETHANOL: ALCOHOL ETHYL (B): 163 mg/dL — AB (ref <10)

## 2017-09-17 LAB — SALICYLATE LEVEL: Salicylate Lvl: <7 mg/dL (ref 2.8–30.0)

## 2017-09-17 LAB — ACETAMINOPHEN LEVEL: Acetaminophen (Tylenol), Serum: <10 ug/mL — ABNORMAL LOW (ref 10–30)

## 2017-09-17 LAB — LITHIUM LEVEL

## 2017-09-17 MED ORDER — QUETIAPINE FUMARATE 100 MG PO TABS: 100.0000 mg | ORAL_TABLET | Freq: Every day | ORAL | 0 refills | Status: DC

## 2017-09-17 MED ORDER — CEPHALEXIN 500 MG PO CAPS
500.0000 mg | ORAL_CAPSULE | Freq: Four times a day (QID) | ORAL | Status: DC
  Administered 2017-09-17: 500 mg via ORAL
  Filled 2017-09-17: qty 1

## 2017-09-17 MED ORDER — ZIPRASIDONE MESYLATE 20 MG IM SOLR: 20.0000 mg | INTRAMUSCULAR | Status: DC | PRN

## 2017-09-17 MED ORDER — LITHIUM CARBONATE 300 MG PO CAPS: 300.0000 mg | ORAL_CAPSULE | Freq: Two times a day (BID) | ORAL | Status: DC

## 2017-09-17 MED ORDER — QUETIAPINE FUMARATE 100 MG PO TABS: 100.0000 mg | ORAL_TABLET | Freq: Every day | ORAL | Status: DC

## 2017-09-17 MED ORDER — LORAZEPAM 1 MG PO TABS: 1.0000 mg | ORAL_TABLET | ORAL | Status: DC | PRN

## 2017-09-17 MED ORDER — OLANZAPINE 10 MG PO TBDP: 10.0000 mg | ORAL_TABLET | Freq: Three times a day (TID) | ORAL | Status: DC | PRN

## 2017-09-17 MED ORDER — LITHIUM CARBONATE 300 MG PO TABS: 300.0000 mg | ORAL_TABLET | Freq: Two times a day (BID) | ORAL | 0 refills | Status: DC

## 2017-09-17 NOTE — Progress Notes (Signed)
TTS consulted with Donell SievertSpencer Simon, PA who recommends continued observation for safety and stabilization and to be reassessed by psych. EDP Pollina, Canary Brimhristopher J, MD and pt;s nurse Olena LeatherwoodJaneen, RN have been advised of the disposition.  [name], MSW, LCSW Therapeutic Triage Specialist  [phone]
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
## 2017-09-17 NOTE — ED Provider Notes (Signed)
Patient is calm and cooperative at this time. He would like to go home. He does not appear to be a threat to himself or others at this time. He requests refills of his medications. He is going to go to his mothers house now.  He does not want any assistance with ETOH abuse   [name], Marc Gisler, MD 09/17/17 [phone]

## 2017-09-17 NOTE — ED Notes (Signed)
Pt given sandwich and sprite.  
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
## 2017-09-17 NOTE — ED Notes (Signed)
Pt's belongings placed in cabinets marked "Margo AyeHall B" behind nurses station.
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
## 2017-09-17 NOTE — ED Notes (Addendum)
Pt has gold tops in lab if needed.

## 2017-09-19 ENCOUNTER — Encounter (HOSPITAL_BASED_OUTPATIENT_CLINIC_OR_DEPARTMENT_OTHER): Payer: Self-pay

## 2017-09-21 ENCOUNTER — Encounter (HOSPITAL_COMMUNITY): Payer: Self-pay | Admitting: Emergency Medicine

## 2017-09-21 ENCOUNTER — Emergency Department (HOSPITAL_COMMUNITY)
Admission: EM | Admit: 2017-09-21 | Discharge: 2017-09-21 | Disposition: A | Discharge status: Home / Self Care | Payer: 59 | Attending: Emergency Medicine | Admitting: Emergency Medicine

## 2017-09-21 ENCOUNTER — Other Ambulatory Visit: Payer: Self-pay

## 2017-09-21 DIAGNOSIS — R45851 Suicidal ideations: Secondary | ICD-10-CM | POA: Insufficient documentation

## 2017-09-21 DIAGNOSIS — Z590 Homelessness: Secondary | ICD-10-CM

## 2017-09-21 DIAGNOSIS — F329 Major depressive disorder, single episode, unspecified: Secondary | ICD-10-CM | POA: Diagnosis present

## 2017-09-21 DIAGNOSIS — Z818 Family history of other mental and behavioral disorders: Secondary | ICD-10-CM

## 2017-09-21 DIAGNOSIS — F1410 Cocaine abuse, uncomplicated: Secondary | ICD-10-CM | POA: Insufficient documentation

## 2017-09-21 DIAGNOSIS — F32A Depression, unspecified: Secondary | ICD-10-CM | POA: Diagnosis present

## 2017-09-21 DIAGNOSIS — F419 Anxiety disorder, unspecified: Secondary | ICD-10-CM | POA: Diagnosis present

## 2017-09-21 DIAGNOSIS — Z811 Family history of alcohol abuse and dependence: Secondary | ICD-10-CM

## 2017-09-21 LAB — CBC
HEMATOCRIT: 36.3 % — AB (ref 39.0–52.0)
HEMOGLOBIN: 12.3 g/dL — AB (ref 13.0–17.0)
MCH: 31.1 pg (ref 26.0–34.0)
MCHC: 33.9 g/dL (ref 30.0–36.0)
MCV: 91.7 fL (ref 78.0–100.0)
Platelets: 313 10*3/uL (ref 150–400)
RBC: 3.96 MIL/uL — ABNORMAL LOW (ref 4.22–5.81)
RDW: 13 % (ref 11.5–15.5)
WBC: 7.9 10*3/uL (ref 4.0–10.5)

## 2017-09-21 LAB — COMPREHENSIVE METABOLIC PANEL
ALBUMIN: 4.1 g/dL (ref 3.5–5.0)
ALK PHOS: 78 U/L (ref 38–126)
ALT: 30 U/L (ref 17–63)
ANION GAP: 9 (ref 5–15)
AST: 28 U/L (ref 15–41)
BILIRUBIN TOTAL: 0.5 mg/dL (ref 0.3–1.2)
BUN: 13 mg/dL (ref 6–20)
CO2: 23 mmol/L (ref 22–32)
Calcium: 9.4 mg/dL (ref 8.9–10.3)
Chloride: 107 mmol/L (ref 101–111)
Creatinine, Ser: 1.08 mg/dL (ref 0.61–1.24)
GFR calc Af Amer: >60 mL/min (ref >60)
GFR calc non Af Amer: >60 mL/min (ref >60)
GLUCOSE: 127 mg/dL — AB (ref 65–99)
POTASSIUM: 3.9 mmol/L (ref 3.5–5.1)
SODIUM: 139 mmol/L (ref 135–145)
TOTAL PROTEIN: 7.2 g/dL (ref 6.5–8.1)

## 2017-09-21 LAB — ACETAMINOPHEN LEVEL: Acetaminophen (Tylenol), Serum: <10 ug/mL — ABNORMAL LOW (ref 10–30)

## 2017-09-21 LAB — RAPID URINE DRUG SCREEN, HOSP PERFORMED
AMPHETAMINES: NOT DETECTED
BARBITURATES: NOT DETECTED
BENZODIAZEPINES: NOT DETECTED
COCAINE: POSITIVE — AB
Opiates: NOT DETECTED
TETRAHYDROCANNABINOL: NOT DETECTED

## 2017-09-21 LAB — SALICYLATE LEVEL: Salicylate Lvl: <7 mg/dL (ref 2.8–30.0)

## 2017-09-21 LAB — ETHANOL: Alcohol, Ethyl (B): <10 mg/dL (ref <10)

## 2017-09-21 MED ORDER — LITHIUM CARBONATE ER 300 MG PO TBCR
300.0000 mg | EXTENDED_RELEASE_TABLET | Freq: Two times a day (BID) | ORAL | Status: DC
  Administered 2017-09-21: 300 mg via ORAL
  Filled 2017-09-21: qty 1

## 2017-09-21 MED ORDER — QUETIAPINE FUMARATE ER 50 MG PO TB24: 100.0000 mg | ORAL_TABLET | Freq: Every day | ORAL | Status: DC

## 2017-09-21 NOTE — ED Triage Notes (Signed)
Patient states he wants to hurt himself. Patient is intoxicated.

## 2017-09-21 NOTE — BH Assessment (Addendum)
Assessment Note  [name] is an 57 y.o. male that presents this date voluntary with a history of Bipolar 1 disorder, anxiety, depression, alcohol and cocaine abuse. Patient admits to active S/I but denies any plan or intent. Patient is observed to be impaired at the time of assessment and renders limited history. Patient is displaying some active thought blocking and finds it difficult to render history due to being impaired. Per staff patient told triage nurse he wanted to hurt himself but did not disclose a specific plan. He also reported he was intoxicated. BAL and UDS pending. Per notes staff attempted to interact with patient, he does awake to voice and reports "yes" to thoughts of wanting to hurt himself but will not answer any further question and falls back asleep. Patient denies any previous attempts/gestures at self harm. Patient denies any H/I or AVH. Patient will not render his SA history or current use although per notes has a history of cocaine and alcohol use. BAL and UDS is pending. Per history review patient was last seen on 09/13/17 presenting that date with similar symptoms to include substance induced mood disorder and passive S/I. Patient was advised at that time to follow up with [facility] to assist with aftercare although failed to do so per patient. Patient stated "he didn't have a ride." Patient denies any previous OP care or assistance with medication interventions. Patient cannot recall the last time he was on any medication/s to assist with symptoms management. Patient denies any current symptoms associated with his MH issues. Patient is oriented to time/place although renders limited history. Sharma Covert DO also evaluated patient who recommended patient be discharged later this and will be provided with follow up resources.        Diagnosis: Bipolar 1 (per notes)   Past Medical History:  Past Medical History:  Diagnosis Date  . Bipolar 1 disorder [facility])     Past Surgical  History:  Procedure Laterality Date  . EYE SURGERY      Family History:  Family History  Problem Relation Age of Onset  . Hyperlipidemia Mother   . Alcoholism Mother   . Suicidality Cousin     Social History:  reports that he has never smoked. He has never used smokeless tobacco. He reports that he drinks alcohol. He reports that he does not use drugs.  Additional Social History:  Alcohol / Drug Use Pain Medications: See MAR Prescriptions: See MAR Over the Counter: See MAR History of alcohol / drug use?: Yes Longest period of sobriety (when/how long): Unknown Negative Consequences of Use: Personal relationships Withdrawal Symptoms: (Denies) Substance #1 Name of Substance 1: alcohol 1 - Age of First Use: 20 1 - Amount (size/oz): Pt states" amounts vary" 1 - Frequency: daily 1 - Duration: ongoing 1 - Last Use / Amount: 09/20/17 Pt stated "a couple beers" BAL pending  CIWA: CIWA-Ar BP: 137/80 Pulse Rate: 73 COWS:    Allergies:  Allergies  Allergen Reactions  . Medrol [Methylprednisolone] Rash    Home Medications:  (Not in a hospital admission)  OB/GYN Status:  No LMP for male patient.  General Assessment Data Assessment unable to be completed: Yes Reason for not completing assessment: TTS attempted to assess the pt. Pt does not respond. TTS asked the pt is he experiencing any SI, HI, or AVH and pt begins to grunt making noises but does not speak. Pt reported to triage that he is intoxicated. Labs show only positive for cocaine, no other drug or  alcohol use. TTS to complete assessment once the pt is able to participate. Location of Assessment: [facility] ED TTS Assessment: In system Is this a Tele or Face-to-Face Assessment?: Face-to-Face Is this an Initial Assessment or a Re-assessment for this encounter?: Initial Assessment Marital status: Married Gattman name: NA Is patient pregnant?: No Pregnancy Status: No Living Arrangements: Other (Comment)(Homeless) Can pt return  to current living arrangement?: Yes Admission Status: Voluntary Is patient capable of signing voluntary admission?: Yes Referral Source: Self/Family/Friend Insurance type: [facility] MCR  Medical Screening Exam [facility] Walk-in ONLY) Medical Exam completed: Yes  Crisis Care Plan Living Arrangements: Other (Comment)(Homeless) Legal Guardian: Other:(NA) Name of Psychiatrist: None Name of Therapist: None  Education Status Is patient currently in school?: No Is the patient employed, unemployed or receiving disability?: Unemployed  Risk to self with the past 6 months Suicidal Ideation: Yes-Currently Present Has patient been a risk to self within the past 6 months prior to admission? : No Suicidal Intent: No Has patient had any suicidal intent within the past 6 months prior to admission? : No Is patient at risk for suicide?: Yes Suicidal Plan?: Yes-Currently Present Has patient had any suicidal plan within the past 6 months prior to admission? : No Specify Current Suicidal Plan: NA Access to Means: No What has been your use of drugs/alcohol within the last 12 months?: ETOH use Previous Attempts/Gestures: No How many times?: 0 Other Self Harm Risks: NA Triggers for Past Attempts: Unknown Intentional Self Injurious Behavior: None Family Suicide History: No Recent stressful life event(s): Other (Comment)(Homeless) Persecutory voices/beliefs?: No Depression: Yes Depression Symptoms: Feeling worthless/self pity Substance abuse history and/or treatment for substance abuse?: Yes Suicide prevention information given to non-admitted patients: Not applicable  Risk to Others within the past 6 months Homicidal Ideation: No Does patient have any lifetime risk of violence toward others beyond the six months prior to admission? : No Thoughts of Harm to Others: No Current Homicidal Intent: No Current Homicidal Plan: No Describe Current Homicidal Plan: NA Access to Homicidal Means: No Identified  Victim: NA History of harm to others?: No Assessment of Violence: None Noted Violent Behavior Description: NA Does patient have access to weapons?: No Criminal Charges Pending?: No Does patient have a court date: No Is patient on probation?: No  Psychosis Hallucinations: None noted Delusions: None noted  Mental Status Report Appearance/Hygiene: In scrubs Eye Contact: Poor Motor Activity: Unsteady Speech: Slow, Slurred Level of Consciousness: Drowsy Mood: Depressed Affect: Flat Anxiety Level: Minimal Thought Processes: Thought Blocking Judgement: Impaired Orientation: Person, Place, Time Obsessive Compulsive Thoughts/Behaviors: None  Cognitive Functioning Concentration: Decreased Memory: Recent Intact Is patient IDD: No Is patient DD?: No Insight: Poor Impulse Control: Poor Appetite: Fair Have you had any weight changes? : No Change Sleep: Decreased Total Hours of Sleep: 5  ADLScreening [facility] Assessment Services) Patient's cognitive ability adequate to safely complete daily activities?: Yes Patient able to express need for assistance with ADLs?: Yes Independently performs ADLs?: Yes (appropriate for developmental age)  Prior Inpatient Therapy Prior Inpatient Therapy: Yes Prior Therapy Dates: 2017 Prior Therapy Facilty/Provider(s): [facility] Reason for Treatment: MH issues  Prior Outpatient Therapy Prior Outpatient Therapy: No Does patient have an ACCT team?: No Does patient have Intensive In-House Services?  : No Does patient have [facility] services? : No Does patient have P4CC services?: No  ADL Screening (condition at time of admission) Patient's cognitive ability adequate to safely complete daily activities?: Yes Is the patient deaf or have difficulty hearing?: No Does the  patient have difficulty seeing, even when wearing glasses/contacts?: No Does the patient have difficulty concentrating, remembering, or making decisions?: Yes Patient able to express need  for assistance with ADLs?: Yes Does the patient have difficulty dressing or bathing?: No Independently performs ADLs?: Yes (appropriate for developmental age) Does the patient have difficulty walking or climbing stairs?: No Weakness of Legs: None Weakness of Arms/Hands: None  Home Assistive Devices/Equipment Home Assistive Devices/Equipment: None  Therapy Consults (therapy consults require a physician order) PT Evaluation Needed: No OT Evalulation Needed: No SLP Evaluation Needed: No Abuse/Neglect Assessment (Assessment to be complete while patient is alone) Physical Abuse: Denies Verbal Abuse: Denies Sexual Abuse: Denies Exploitation of patient/patient's resources: Denies Self-Neglect: Denies Values / Beliefs Cultural Requests During Hospitalization: None Spiritual Requests During Hospitalization: None Consults Spiritual Care Consult Needed: No Social Work Consult Needed: No Merchant navy officerAdvance Directives (For Healthcare) Does Patient Have a Medical Advance Directive?: No Would patient like information on creating a medical advance directive?: No - Patient declined    Additional Information 1:1 In Past 12 Months?: No CIRT Risk: No Elopement Risk: No Does patient have medical clearance?: Yes     Disposition: Sharma Covertorman DO also evaluated patient who recommended patient be discharged later this and will be provided with follow up resources.        Disposition Initial Assessment Completed for this Encounter: Yes Disposition of Patient: (Observe and monitor for safety ) Patient refused recommended treatment: No Mode of transportation if patient is discharged?: Loreli Slot(UNK)  On Site Evaluation by:   Reviewed with Physician:    Alfredia Fergusonavid L Elona Yinger 09/21/2017 10:10 AM

## 2017-09-21 NOTE — BH Assessment (Signed)
Patient evaluated by Dr. Sharma [name] and Marc Plumakia Jeneen Doutt, NP.Patient does not meet criteria for inpatient treatment. No Patient ok to discharge. [facility]

## 2017-09-21 NOTE — ED Notes (Signed)
Call Theodoro GristDave once patient is up and moving around [phone].

## 2017-09-21 NOTE — Consult Note (Addendum)
[facility] Psych ED Discharge  09/21/2017 1:00 PM [name]  MRN:  [id number] Principal Problem: Anxiety and depression Discharge Diagnoses:  Patient Active Problem List   Diagnosis Date Noted  . Anxiety and depression [F41.9, F32.9]   . Bipolar affective disorder in remission (HCC) [F31.70] 01/16/2016  . Cocaine use disorder, mild, abuse (HCC) [F14.10] 12/31/2015    Subjective:  Per chart review, patient was admitted with thoughts to harm self in the setting of alcohol intoxication. On interview, reports, "I am basically homeless, and I need help to get me a place.  I still have my prescriptions from the other day.  I am getting assistance with the place." He denies current SI, HI or AVH and feels safe for discharge.    Total Time spent with patient: 45 minutes  Past Psychiatric History: Bipolar 1 disorder   Past Medical History:  Past Medical History:  Diagnosis Date  . Bipolar 1 disorder [facility])     Past Surgical History:  Procedure Laterality Date  . EYE SURGERY     Family History:  Family History  Problem Relation Age of Onset  . Hyperlipidemia Mother   . Alcoholism Mother   . Suicidality Cousin    Family Psychiatric  History: Mother-alcoholism. Social History:  Social History   Substance and Sexual Activity  Alcohol Use Yes  . Alcohol/week: 0.0 oz   Comment: occasionally     Social History   Substance and Sexual Activity  Drug Use No  . Types: Cocaine   Comment: Used to be a cocaine user    Social History   Socioeconomic History  . Marital status: Single    Spouse name: Not on file  . Number of children: Not on file  . Years of education: Not on file  . Highest education level: Not on file  Occupational History  . Not on file  Social Needs  . Financial resource strain: Not on file  . Food insecurity:    Worry: Not on file    Inability: Not on file  . Transportation needs:    Medical: Not on file    Non-medical: Not on file  Tobacco Use  .  Smoking status: Never Smoker  . Smokeless tobacco: Never Used  Substance and Sexual Activity  . Alcohol use: Yes    Alcohol/week: 0.0 oz    Comment: occasionally  . Drug use: No    Types: Cocaine    Comment: Used to be a cocaine user  . Sexual activity: Not on file  Lifestyle  . Physical activity:    Days per week: Not on file    Minutes per session: Not on file  . Stress: Not on file  Relationships  . Social connections:    Talks on phone: Not on file    Gets together: Not on file    Attends religious service: Not on file    Active member of club or organization: Not on file    Attends meetings of clubs or organizations: Not on file    Relationship status: Not on file  Other Topics Concern  . Not on file  Social History Narrative  . Not on file    Has this patient used any form of tobacco in the last 30 days? (Cigarettes, Smokeless Tobacco, Cigars, and/or Pipes) Prescription not provided because: Patient does not use tobacco.  Current Medications: Current Facility-Administered Medications  Medication Dose Route Frequency Provider Last Rate Last Dose  . lithium carbonate (LITHOBID) CR tablet 300  mg  300 mg Oral Q12H Cherly Beachorman, Srija Southard J, DO      . QUEtiapine (SEROQUEL XR) 24 hr tablet 100 mg  100 mg Oral QHS Cherly BeachNorman, Chistina Roston J, DO       Current Outpatient Medications  Medication Sig Dispense Refill  . lithium 300 MG tablet Take 1 tablet (300 mg total) by mouth 2 (two) times daily for 28 doses. 28 tablet 0  . QUEtiapine (SEROQUEL) 100 MG tablet Take 1 tablet (100 mg total) by mouth at bedtime. 15 tablet 0   PTA Medications:  (Not in a hospital admission)  Musculoskeletal: Strength & Muscle Tone: within normal limits Gait & Station: normal Patient leans: N/A  Psychiatric Specialty Exam: Physical Exam  Nursing note and vitals reviewed. Constitutional: He is oriented to person, place, and time. He appears well-developed and well-nourished.  HENT:  Head:  Normocephalic and atraumatic.  Neck: Normal range of motion.  Respiratory: Effort normal.  Musculoskeletal: Normal range of motion.  Neurological: He is alert and oriented to person, place, and time.  Skin: No rash noted.  Psychiatric: He has a normal mood and affect. His speech is normal and behavior is normal. Judgment and thought content normal. Cognition and memory are normal.    Review of Systems  Psychiatric/Behavioral: Positive for depression. Negative for hallucinations, substance abuse and suicidal ideas. The patient is nervous/anxious.   All other systems reviewed and are negative.   Blood pressure 137/80, pulse 73, temperature 97.8 F (36.6 C), temperature source Oral, resp. rate 18, height 5\' 10"  (1.778 m), weight 74.8 kg (165 lb), SpO2 100 %.Body mass index is 23.68 kg/m.  General Appearance: Fairly Groomed, middle aged, Caucasian male, wearing paper hospital scrubs and lying in bed. NAD.   Eye Contact:  Good  Speech:  Clear and Coherent and increased rate but not pressured.  Volume:  Normal  Mood:  Anxious  Affect:  Congruent  Thought Process:  Goal Directed, Linear and Descriptions of Associations: Intact  Orientation:  Full (Time, Place, and Person)  Thought Content:  Logical  Suicidal Thoughts:  No  Homicidal Thoughts:  No  Memory:  Immediate;   Good Recent;   Good Remote;   Good  Judgement:  Fair  Insight:  Fair  Psychomotor Activity:  Normal  Concentration:  Concentration: Good and Attention Span: Good  Recall:  Good  Fund of Knowledge:  Good  Language:  Good  Akathisia:  No  Handed:  Right  AIMS (if indicated):   N/A  Assets:  Communication Skills Housing  ADL's:  Intact  Cognition:  WNL  Sleep:   Okay     Demographic Factors:  Male and Living alone  Loss Factors: Loss of significant relationship  Historical Factors: Family history of suicide  Risk Reduction Factors:   Positive coping skills or problem solving skills  Continued Clinical  Symptoms:  Bipolar Disorder:   Depressive phase  Cognitive Features That Contribute To Risk:  Thought constriction (tunnel vision)    Suicide Risk:  Minimal: No identifiable suicidal ideation.  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 He is relatively organized in thought process with rapid but non pressured speech today. He slept well overnight. He denies SI, HI or AVH. He does not warrant inpatient psychiatric hospitalization and will follow up with  his outpatient provider.   Plan Of Care/Follow-up recommendations:  -Restart home medications: Lithium 300 mg BID for mood stabilization and Seroquel 100 mg qhs for mood augmentation and sleep.  -Patient will follow up with his outpatient provider.   Disposition: Discharge to home.  Truman Hayward, FNP 09/21/2017, 1:00 PM   Patient seen face-to-face for psychiatric evaluation, chart reviewed and case discussed with the physician extender and developed treatment plan. Reviewed the information documented and agree with the treatment plan.  Juanetta Beets, DO 09/21/17 7:09 PM

## 2017-09-21 NOTE — BH Assessment (Signed)
[facility] Assessment Progress Note  Sharma Covertorman DO also evaluated patient who recommended patient be discharged later this and will be provided with follow up resources.

## 2017-09-21 NOTE — ED Provider Notes (Signed)
[facility]-EMERGENCY DEPT Provider Note   CSN: [id number] Arrival date & time: 09/21/17  0331     History   Chief Complaint Chief Complaint  Patient presents with  . 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  The history is provided by the patient and medical records.    57 year old male with history of bipolar 1 disorder, anxiety, depression, cocaine abuse, presenting to the ED with reported suicidal ideation.  Patient told triage nurse he wanted to hurt himself but did not disclose a specific plan.  He also reported he was intoxicated.  I have tried to talk with patient, he does awake to voice and reports "yes" to thoughts of wanting to hurt himself but will not answer any further question and falls back asleep.  Past Medical History:  Diagnosis Date  . Bipolar 1 disorder [facility](HCC)     Patient Active Problem List   Diagnosis Date Noted  . Anxiety and depression   . Bipolar affective disorder in remission (HCC) 01/16/2016  . Cocaine use disorder, mild, abuse (HCC) 12/31/2015    Past Surgical History:  Procedure Laterality Date  . EYE SURGERY          Home Medications    Prior to Admission medications   Medication Sig Start Date End Date Taking? Authorizing Provider  lithium 300 MG tablet Take 1 tablet (300 mg total) by mouth 2 (two) times daily for 28 doses. 09/17/17 10/01/17 Yes Azalia Bilisampos, Kevin, MD  QUEtiapine (SEROQUEL) 100 MG tablet Take 1 tablet (100 mg total) by mouth at bedtime. 09/17/17  Yes Azalia Bilisampos, Kevin, MD    Family History Family History  Problem Relation Age of Onset  . Hyperlipidemia Mother   . Alcoholism Mother   . Suicidality Cousin     Social History Social History   Tobacco Use  . Smoking status: Never Smoker  . Smokeless tobacco: Never Used  Substance Use Topics  . Alcohol use: Yes    Alcohol/week: 0.0 oz    Comment: occasionally  . Drug use: No    Types: Cocaine    Comment: Used to be a cocaine user      Allergies   Medrol [methylprednisolone]   Review of Systems Review of Systems  Psychiatric/Behavioral: Positive for suicidal ideas.  All other systems reviewed and are negative.    Physical Exam Updated Vital Signs BP 116/67 (BP Location: Left Arm)   Pulse 86   Temp 98.4 F (36.9 C) (Oral)   Resp 16   Ht 5\' 10"  (1.778 m)   Wt 74.8 kg (165 lb)   SpO2 97%   BMI 23.68 kg/m   Physical Exam  Constitutional: He is oriented to person, place, and time. He appears well-developed and well-nourished.  Snoring, sleeping soundly  HENT:  Head: Normocephalic and atraumatic.  Mouth/Throat: Oropharynx is clear and moist.  Eyes: Pupils are equal, round, and reactive to light. Conjunctivae and EOM are normal.  Neck: Normal range of motion.  Cardiovascular: Normal rate, regular rhythm and normal heart sounds.  Pulmonary/Chest: Effort normal and breath sounds normal. No stridor. No respiratory distress.  Abdominal: Soft. Bowel sounds are normal. There is no tenderness. There is no rebound.  Musculoskeletal: Normal range of motion.  Neurological: He is alert and oriented to person, place, and time.  Skin: Skin is warm and dry.  Psychiatric: He has a normal mood and affect.  Nursing note and vitals reviewed.    ED Treatments / Results  Labs (all labs ordered are listed, but only abnormal results are displayed) Labs Reviewed  COMPREHENSIVE METABOLIC PANEL - Abnormal; Notable for the following components:      Result Value   Glucose, Bld 127 (*)    All other components within normal limits  ACETAMINOPHEN LEVEL - Abnormal; Notable for the following components:   Acetaminophen (Tylenol), Serum <10 (*)    All other components within normal limits  CBC - Abnormal; Notable for the following components:   RBC 3.96 (*)    Hemoglobin 12.3 (*)    HCT 36.3 (*)    All other components within normal limits  RAPID URINE DRUG SCREEN, HOSP PERFORMED - Abnormal; Notable for the following  components:   Cocaine POSITIVE (*)    All other components within normal limits  ETHANOL  SALICYLATE LEVEL    EKG None  Radiology No results found.  Procedures Procedures (including critical care time)  Medications Ordered in ED Medications - No data to display   Initial Impression / Assessment and Plan / ED Course  I have reviewed the triage vital signs and the nursing notes.  Pertinent labs & imaging results that were available during my care of the patient were reviewed by me and considered in my medical decision making (see chart for details).  57 year old male here with reported suicidal ideation.  Told triage nurse he wanted to hurt himself.  He does state "yes" when asked this during exam, however falls back asleep and will not answer further questions.  His labs are all reassuring.  Ethanol was negative.  UDS is positive for cocaine.  TTS attempted to evaluate patient with similar result.  He continues to appear very sleepy but arouses appropriately to verbal and tactile stimuli.  His vitals are stable.  Will attempt TTS at a later time.  Final Clinical Impressions(s) / ED Diagnoses   Final diagnoses:  Suicidal ideation    ED Discharge Orders    None       Garlon Hatchet, PA-C 09/21/17 0625    Paula Libra, MD 09/21/17 [phone]

## 2017-09-21 NOTE — Progress Notes (Signed)
TTS attempted to assess the pt. Pt does not respond. TTS asked the pt is he experiencing any SI, HI, or AVH and pt begins to grunt making noises but does not speak. Pt reported to triage that he is intoxicated. Labs show only positive for cocaine, no other drug or alcohol use. TTS to complete assessment once the pt is able to participate.  Princess BruinsAquicha Authur Cubit, MSW, LCSW Therapeutic Triage Specialist  [phone]

## 2019-07-05 ENCOUNTER — Encounter (HOSPITAL_BASED_OUTPATIENT_CLINIC_OR_DEPARTMENT_OTHER): Payer: Self-pay

## 2019-07-05 ENCOUNTER — Emergency Department (HOSPITAL_BASED_OUTPATIENT_CLINIC_OR_DEPARTMENT_OTHER)
Admission: EM | Admit: 2019-07-05 | Discharge: 2019-07-05 | Disposition: A | Discharge status: Home / Self Care | Payer: Self-pay | Attending: Emergency Medicine | Admitting: Emergency Medicine

## 2019-07-05 ENCOUNTER — Other Ambulatory Visit: Payer: Self-pay

## 2019-07-05 DIAGNOSIS — Z79899 Other long term (current) drug therapy: Secondary | ICD-10-CM | POA: Insufficient documentation

## 2019-07-05 DIAGNOSIS — H6121 Impacted cerumen, right ear: Secondary | ICD-10-CM | POA: Insufficient documentation

## 2019-07-05 MED ORDER — CARBAMIDE PEROXIDE 6.5 % OT SOLN: 5.0000 [drp] | Freq: Two times a day (BID) | OTIC | 0 refills | Status: DC | PRN

## 2019-07-05 NOTE — ED Provider Notes (Signed)
[facility] EMERGENCY DEPARTMENT Provider Note   CSN: [id number] Arrival date & time: 07/05/19  1801     History Chief Complaint  Patient presents with  . Otalgia    [name] is a 59 y.o. male.  Pt presents to the ED today with right ear pain. It's been going on for about 1 week and is not going away.  No dental pain.  No cp.        Past Medical History:  Diagnosis Date  . Bipolar 1 disorder [facility])     Patient Active Problem List   Diagnosis Date Noted  . Anxiety and depression   . Bipolar affective disorder in remission (HCC) 01/16/2016  . Cocaine use disorder, mild, abuse (HCC) 12/31/2015    Past Surgical History:  Procedure Laterality Date  . EYE SURGERY         Family History  Problem Relation Age of Onset  . Hyperlipidemia Mother   . Alcoholism Mother   . Suicidality Cousin     Social History   Tobacco Use  . Smoking status: Never Smoker  . Smokeless tobacco: Never Used  Substance Use Topics  . Alcohol use: Yes    Comment: occasionally  . Drug use: No    Types: Cocaine    Comment: Used to be a cocaine user    Home Medications Prior to Admission medications   Medication Sig Start Date End Date Taking? Authorizing Provider  carbamide peroxide (DEBROX) 6.5 % OTIC solution Place 5 drops into both ears 2 (two) times daily as needed (ear wax buildup). 07/05/19   [name], MD  lithium 300 MG tablet Take 1 tablet (300 mg total) by mouth 2 (two) times daily for 28 doses. 09/17/17 10/01/17  [name], MD  QUEtiapine (SEROQUEL) 100 MG tablet Take 1 tablet (100 mg total) by mouth at bedtime. 09/17/17   [name], MD    Allergies    Medrol [methylprednisolone]  Review of Systems   Review of Systems  HENT: Positive for ear pain.   All other systems reviewed and are negative.   Physical Exam Updated Vital Signs BP (!) 153/90 (BP Location: Right Arm)   Pulse 73   Temp 99.5 F (37.5 C) (Oral)   Resp 18   Ht 5\' 10"   (1.778 m)   Wt 74.8 kg   SpO2 98%   BMI 23.68 kg/m   Physical Exam Vitals and nursing note reviewed.  Constitutional:      Appearance: Normal appearance.  HENT:     Head: Normocephalic and atraumatic.     Right Ear: There is impacted cerumen.     Left Ear: Tympanic membrane, ear canal and external ear normal.     Nose: Nose normal.     Mouth/Throat:     Mouth: Mucous membranes are moist.     Pharynx: Oropharynx is clear.  Eyes:     Extraocular Movements: Extraocular movements intact.     Conjunctiva/sclera: Conjunctivae normal.     Pupils: Pupils are equal, round, and reactive to light.  Cardiovascular:     Rate and Rhythm: Normal rate and regular rhythm.     Pulses: Normal pulses.     Heart sounds: Normal heart sounds.  Pulmonary:     Effort: Pulmonary effort is normal.     Breath sounds: Normal breath sounds.  Abdominal:     General: Abdomen is flat. Bowel sounds are normal.     Palpations: Abdomen is soft.  Musculoskeletal:  General: Normal range of motion.     Cervical back: Normal range of motion and neck supple.  Skin:    General: Skin is warm.     Capillary Refill: Capillary refill takes less than 2 seconds.  Neurological:     General: No focal deficit present.     Mental Status: He is alert and oriented to person, place, and time.  Psychiatric:        Mood and Affect: Mood normal.        Behavior: Behavior normal.     ED Results / Procedures / Treatments   Labs (all labs ordered are listed, but only abnormal results are displayed) Labs Reviewed - No data to display  EKG None  Radiology No results found.  Procedures Procedures (including critical care time)  Medications Ordered in ED Medications - No data to display  ED Course  I have reviewed the triage vital signs and the nursing notes.  Pertinent labs & imaging results that were available during my care of the patient were reviewed by me and considered in my medical decision making  (see chart for details).    MDM Rules/Calculators/A&P                      Pt's right ear irrigated by his nurse.  She was able to remove the ear wax.  Pt feels much better.  He is stable for d/c.  Return if worse.  Final Clinical Impression(s) / ED Diagnoses Final diagnoses:  Impacted cerumen of right ear    Rx / DC Orders ED Discharge Orders         Ordered    carbamide peroxide (DEBROX) 6.5 % OTIC solution  2 times daily PRN     07/05/19 1930           Isla Pence, MD 07/05/19 1930

## 2019-07-05 NOTE — ED Triage Notes (Signed)
Pt c/o right ear pain radiating into right jaw states that the pain is worse over night.

## 2019-09-30 IMAGING — CT CT MAXILLOFACIAL W/ CM
3 series · 14 of 47 positions shown, 16 images · IV contrast (Omni 300)
Comparison: None.

CLINICAL DATA: Right dental pain.  Facial pain and headache.

EXAM:
CT MAXILLOFACIAL WITH CONTRAST
TECHNIQUE: Multidetector CT imaging of the maxillofacial structures was
performed with intravenous contrast. Multiplanar CT image
reconstructions were also generated.
CONTRAST:  100mL T0ILKF-RKK IOPAMIDOL (T0ILKF-RKK) INJECTION 61%

[Series 3: facialbone 2.0 (person_name) (person_name) · axial · 0.39mm/px · z∈[-194,-64]mm · 8 of 77 slices shown, 10 images]
[im 6/77  brain]
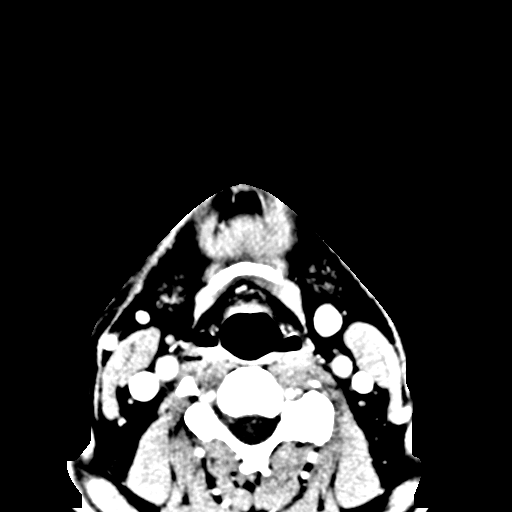
[im 6/77  bone]
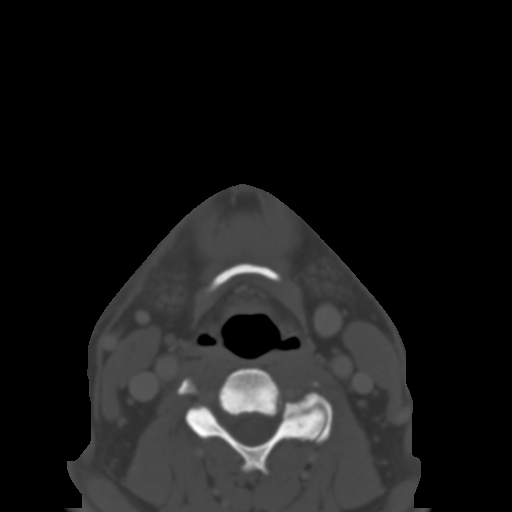
[im 16/77  bone]
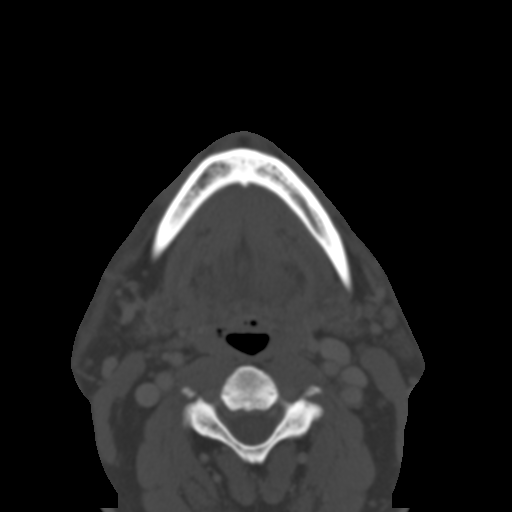
[im 24/77  bone]
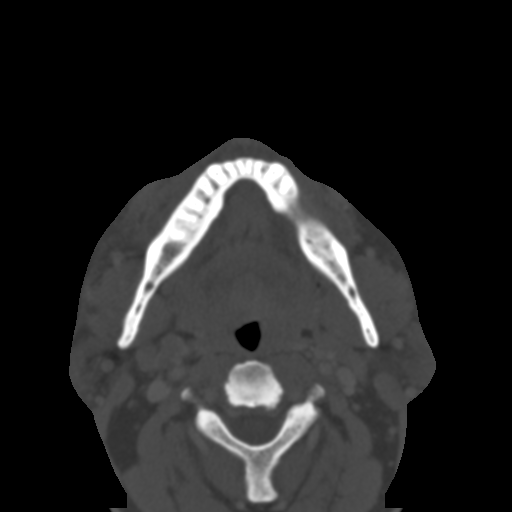
[im 35/77  bone]
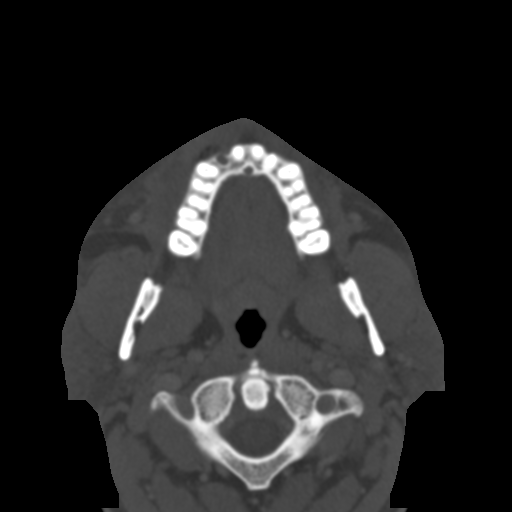
[im 42/77  brain]
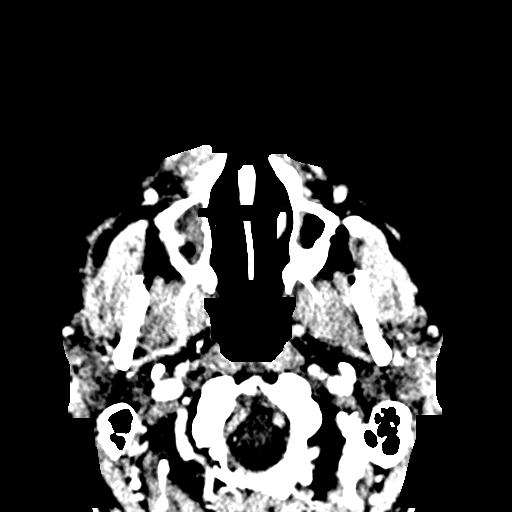
[im 42/77  bone]
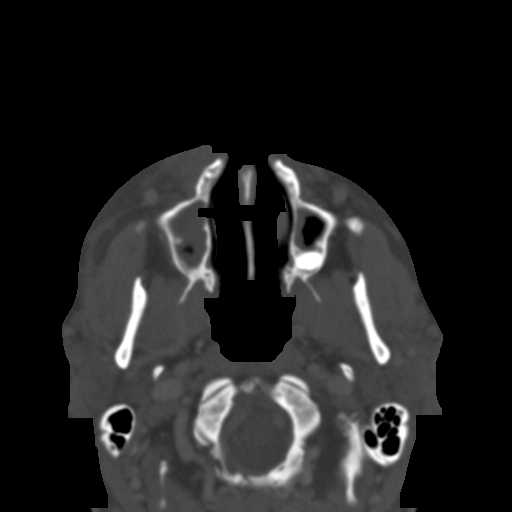
[im 53/77  bone]
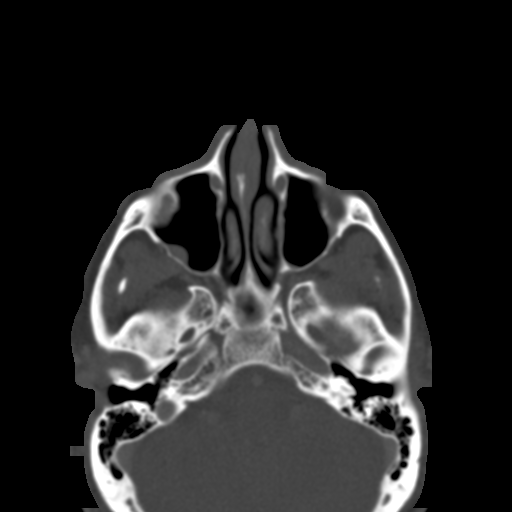
[im 61/77  bone]
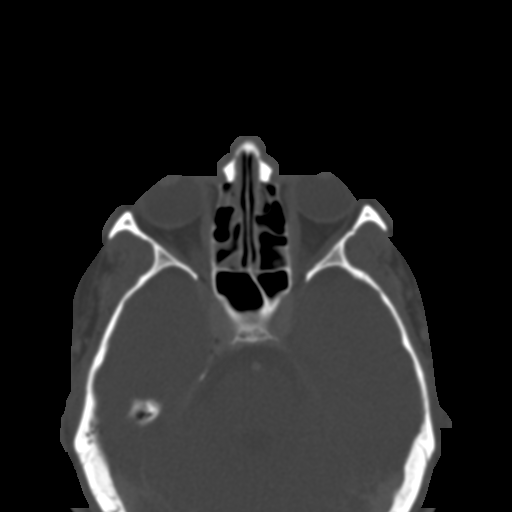
[im 71/77  bone]
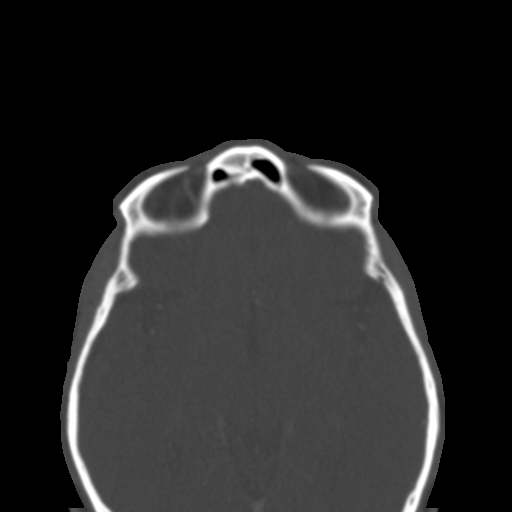

[Series 7: facialbone 2.0 cor st · coronal · 0.30mm/px · 3 of 85 slices shown]
[im 29/85  bone]
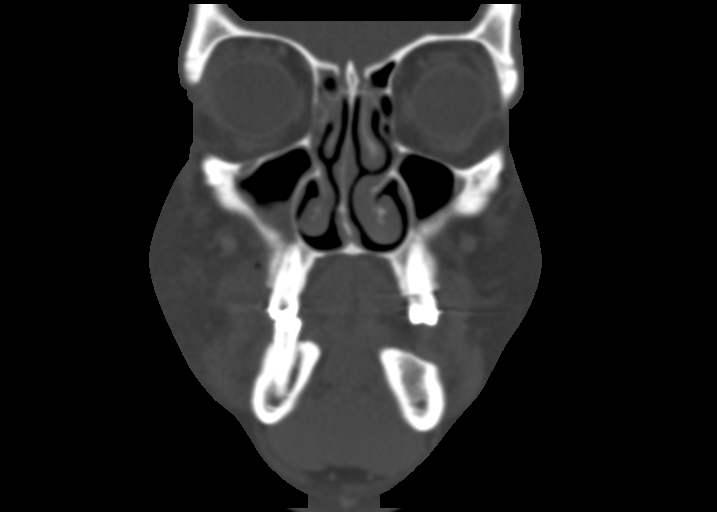
[im 38/85  bone]
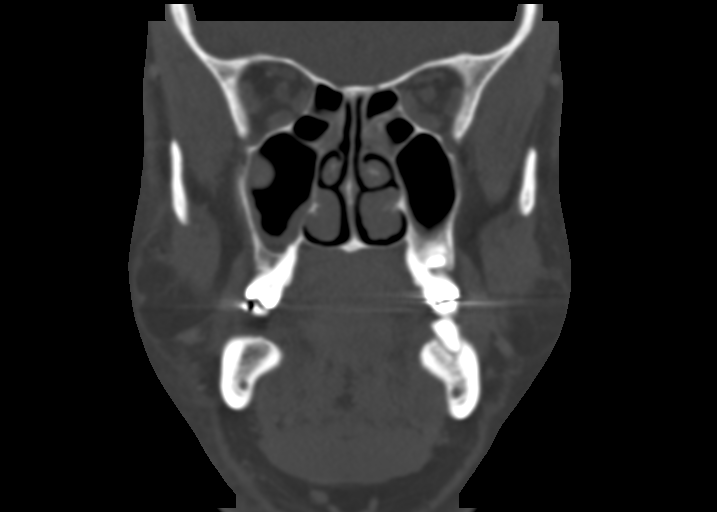
[im 47/85  bone]
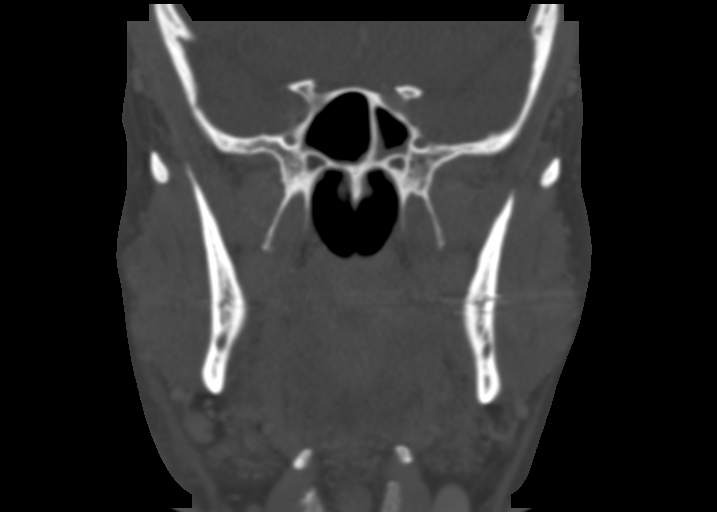

[Series 8: facialbone 2.0 sag st · sagittal · 0.30mm/px · 3 of 89 slices shown]
[im 30/89  bone]
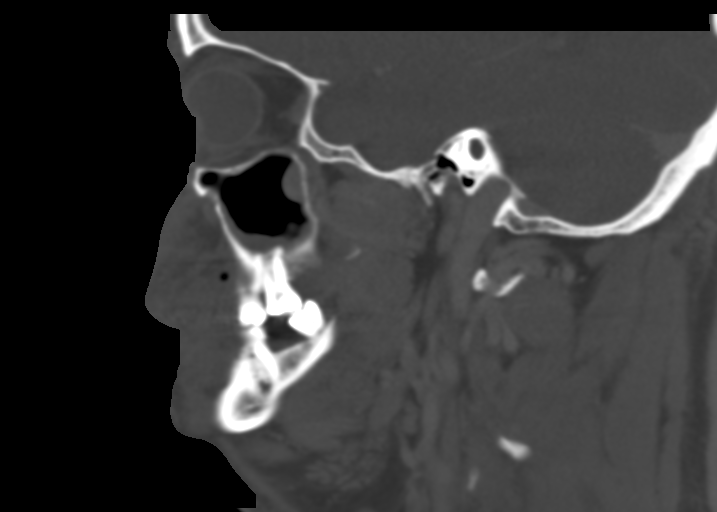
[im 45/89  bone]
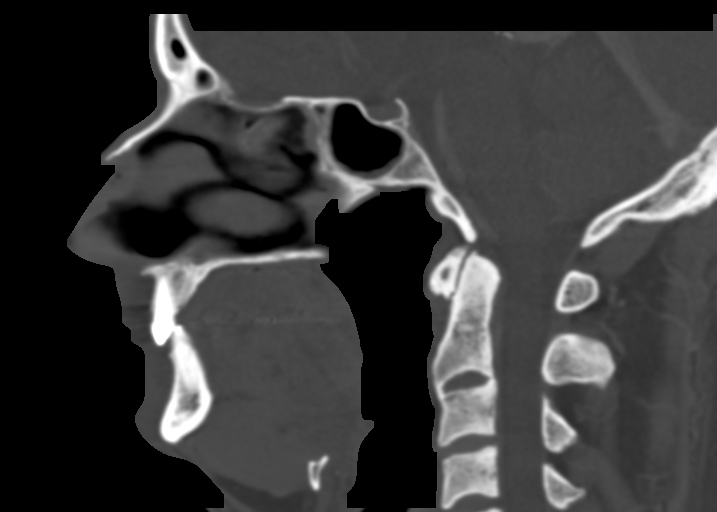
[im 59/89  bone]
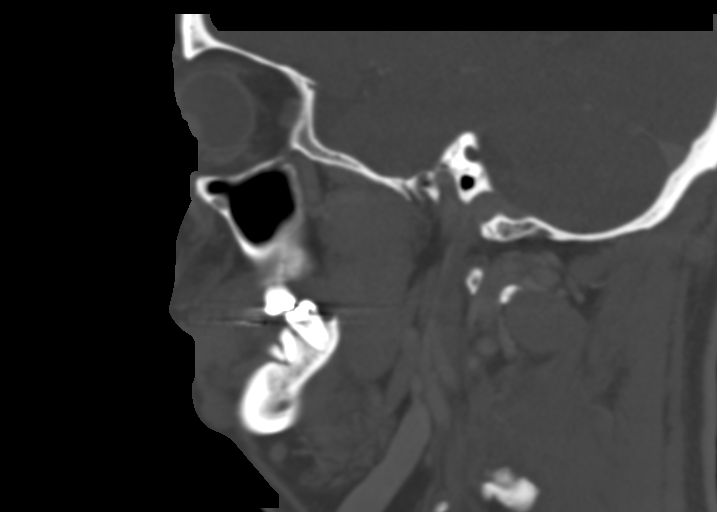

[14 of 47 positions shown; findings below may reference images not displayed]

FINDINGS: Osseous: There is an empty tooth 7 alveolus with mottled lucency in
the soft tissues along the lingual surface, gauze versus is soft
tissue emphysema. No drainable fluid collection. There is regional
cellulitic changes. Swelling continues into the right lower jaw
where there is platysma thickening.

Prominent degenerative spurring of the facets.

Orbits: Negative

Sinuses: Generalized mild mucosal thickening in the paranasal
sinuses.

Soft tissues: As above.

Limited intracranial: Negative
IMPRESSION: Odontogenic infection presumably related to tooth 7 which has been
recently extracted. Mottled lucency in the associated lingual soft
tissues could be gauze or gingival soft tissue emphysema. No
drainable fluid collection.

## 2019-12-31 ENCOUNTER — Other Ambulatory Visit: Payer: Self-pay

## 2019-12-31 ENCOUNTER — Encounter (HOSPITAL_BASED_OUTPATIENT_CLINIC_OR_DEPARTMENT_OTHER): Payer: Self-pay

## 2019-12-31 ENCOUNTER — Emergency Department (HOSPITAL_BASED_OUTPATIENT_CLINIC_OR_DEPARTMENT_OTHER)
Admission: EM | Admit: 2019-12-31 | Discharge: 2019-12-31 | Disposition: A | Discharge status: Home / Self Care | Payer: Self-pay | Attending: Emergency Medicine | Admitting: Emergency Medicine

## 2019-12-31 DIAGNOSIS — Z79899 Other long term (current) drug therapy: Secondary | ICD-10-CM | POA: Insufficient documentation

## 2019-12-31 DIAGNOSIS — M5412 Radiculopathy, cervical region: Secondary | ICD-10-CM | POA: Insufficient documentation

## 2019-12-31 MED ORDER — KETOROLAC TROMETHAMINE 60 MG/2ML IM SOLN
60.0000 mg | Freq: Once | INTRAMUSCULAR | Status: AC
  Administered 2019-12-31: 60 mg via INTRAMUSCULAR
  Filled 2019-12-31: qty 2

## 2019-12-31 MED ORDER — IBUPROFEN 600 MG PO TABS: 600.0000 mg | ORAL_TABLET | Freq: Four times a day (QID) | ORAL | 0 refills | Status: DC | PRN

## 2019-12-31 MED ORDER — PREDNISONE 50 MG PO TABS: 50.0000 mg | ORAL_TABLET | Freq: Every day | ORAL | 0 refills | Status: DC

## 2019-12-31 MED ORDER — METHOCARBAMOL 500 MG PO TABS: 500.0000 mg | ORAL_TABLET | Freq: Two times a day (BID) | ORAL | 0 refills | Status: DC

## 2019-12-31 NOTE — Discharge Instructions (Signed)
Please take your medications, as directed. You were given a prescription for Robaxin which is a muscle relaxer.  You should not drive, work, consume alcohol, or operate machinery while taking this medication as it can make you very drowsy.    Your history and physical exam is suggestive of cervical radiculopathy.  Please read the attachment.  I would like for you to follow-up with your primary care provider regarding today's encounter and for ongoing evaluation and management.  If your symptoms fail to improve with conservative therapy, you may ultimately benefit from neurosurgical evaluation.  I have also placed an ambulatory referral to physical therapy and they should be calling you soon.

## 2019-12-31 NOTE — ED Triage Notes (Signed)
Pt c/o "pinched nerve" with pain to posterior neck down right arm x 4 months-NAD-steady gait

## 2019-12-31 NOTE — ED Provider Notes (Signed)
[facility] EMERGENCY DEPARTMENT Provider Note   CSN: [id number] Arrival date & time: 12/31/19  1621     History Chief Complaint  Patient presents with  . Neck Pain    [name] is a 59 y.o. male with no relevant past medical history who presents to the ED with a 60-month history of right-sided neck discomfort with reproducible sharp "lightning" pain that radiates down his right arm.  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  It spares his other digits.  He denies any precipitating trauma or injury.  He states that he never had the symptoms prior to starting his new job as a Estate agent.  He states that he has applied warm compresses, with little relief.  An RN at his facility recommended that he get an x-ray and prednisone.  Patient reports that he does have a primary care provider, but has not seen them in a long time because he typically does not like doctors.  He denies any fevers or chills, blurred vision, weakness, diminished grip strength, headache or dizziness, syncopal episodes, history of precipitating trauma, bilateral symptoms, history of malignancy, or other symptoms.  HPI     Past Medical History:  Diagnosis Date  . Bipolar 1 disorder [facility])     Patient Active Problem List   Diagnosis Date Noted  . Anxiety and depression   . Bipolar affective disorder in remission (HCC) 01/16/2016  . Cocaine use disorder, mild, abuse (HCC) 12/31/2015    Past Surgical History:  Procedure Laterality Date  . EYE SURGERY         Family History  Problem Relation Age of Onset  . Hyperlipidemia Mother   . Alcoholism Mother   . Suicidality Cousin     Social History   Tobacco Use  . Smoking status: Never Smoker  . Smokeless tobacco: Never Used  Vaping Use  . Vaping Use: Never used  Substance Use Topics  . Alcohol  use: Yes    Comment: occasionally  . Drug use: No    Types: Cocaine    Home Medications Prior to Admission medications   Medication Sig Start Date End Date Taking? Authorizing Provider  carbamide peroxide (DEBROX) 6.5 % OTIC solution Place 5 drops into both ears 2 (two) times daily as needed (ear wax buildup). 07/05/19   [name], MD  ibuprofen (ADVIL) 600 MG tablet Take 1 tablet (600 mg total) by mouth every 6 (six) hours as needed. 12/31/19   [name], PA-C  lithium 300 MG tablet Take 1 tablet (300 mg total) by mouth 2 (two) times daily for 28 doses. 09/17/17 10/01/17  [name], MD  methocarbamol (ROBAXIN) 500 MG tablet Take 1 tablet (500 mg total) by mouth 2 (two) times daily. 12/31/19   [name], PA-C  predniSONE (DELTASONE) 50 MG tablet Take 1 tablet (50 mg total) by mouth daily with breakfast. 12/31/19   [name], PA-C  QUEtiapine (SEROQUEL) 100 MG tablet Take 1 tablet (100 mg total) by mouth at bedtime. 09/17/17   [name], MD    Allergies    Patient has no active allergies.  Review of Systems   Review of Systems  All other systems reviewed and are negative.   Physical Exam Updated Vital Signs BP (!) 155/92 (BP Location: Right Arm)   Pulse 66  Temp 98.2 F (36.8 C) (Oral)   Resp 14   Ht 5\' 10"  (1.778 m)   Wt 77.1 kg   SpO2 100%   BMI 24.39 kg/m   Physical Exam Vitals and nursing note reviewed. Exam conducted with a chaperone present.  Constitutional:      Appearance: Normal appearance.  HENT:     Head: Normocephalic and atraumatic.  Eyes:     General: No scleral icterus.    Conjunctiva/sclera: Conjunctivae normal.  Neck:     Comments: Patient is able to demonstrate full ROM.  No meningismus.  Reproducible radicular symptoms with tilting head upward.  No midline cervical TTP.  Tenderness noted over right-sided paraspinous and trapezial muscle.  No overlying skin changes. Cardiovascular:     Rate and Rhythm: Normal rate.      Pulses: Normal pulses.     Comments: Peripheral pulses intact and symmetric. Pulmonary:     Effort: Pulmonary effort is normal.  Musculoskeletal:     Cervical back: Normal range of motion.     Comments: Strength of upper extremities intact and symmetric bilaterally.  ROM fully intact.  No swelling or asymmetries noted.  Grip strength symmetric.  Peripheral pulses and sensation intact throughout.  Skin:    General: Skin is dry.     Capillary Refill: Capillary refill takes less than 2 seconds.  Neurological:     Mental Status: He is alert.     GCS: GCS eye subscore is 4. GCS verbal subscore is 5. GCS motor subscore is 6.  Psychiatric:        Mood and Affect: Mood normal.        Behavior: Behavior normal.        Thought Content: Thought content normal.     ED Results / Procedures / Treatments   Labs (all labs ordered are listed, but only abnormal results are displayed) Labs Reviewed - No data to display  EKG None  Radiology No results found.  Procedures Procedures (including critical care time)  Medications Ordered in ED Medications  ketorolac (TORADOL) injection 60 mg (has no administration in time range)    ED Course  I have reviewed the triage vital signs and the nursing notes.  Pertinent labs & imaging results that were available during my care of the patient were reviewed by me and considered in my medical decision making (see chart for details).    MDM Rules/Calculators/A&P                          Patient's history and physical exam is suggestive of cervical radiculopathy, likely due to degenerative disc disease.  Patient does not have any midline cervical tenderness to palpation and denies any precipitating trauma or injury.  Plain films would not give a clear picture and I do not feel as though CT is warranted at this time.  Patient's cervical radiculopathy is unilateral.  He denies any history of renal impairment or diabetes.  We will treat with IM Toradol  here in the ED and discharged home with prednisone burst and NSAIDs.  I will also prescribe patient Robaxin, muscle relaxant.  Discussed adverse effects.  Will encourage him to follow-up with his primary care provider regarding today's encounter.  Patient may benefit from physical therapy and possibly neurosurgical evaluation of his symptoms have improved with conservative therapy.  Strict ED return precautions discussed.  Patient voices understanding and is agreeable to the plan.  Final Clinical Impression(s) / ED  Diagnoses Final diagnoses:  Cervical radiculopathy    Rx / DC Orders ED Discharge Orders         Ordered    methocarbamol (ROBAXIN) 500 MG tablet  2 times daily        12/31/19 1959    ibuprofen (ADVIL) 600 MG tablet  Every 6 hours PRN        12/31/19 1959    Ambulatory referral to Physical Therapy        12/31/19 1959    predniSONE (DELTASONE) 50 MG tablet  Daily with breakfast        12/31/19 1959           [name], PA-C 12/31/19 2017    Cathren Laine, MD 01/01/20 1706

## 2020-04-20 ENCOUNTER — Other Ambulatory Visit: Payer: Self-pay

## 2020-04-20 ENCOUNTER — Emergency Department (HOSPITAL_BASED_OUTPATIENT_CLINIC_OR_DEPARTMENT_OTHER)
Admission: EM | Admit: 2020-04-20 | Discharge: 2020-04-20 | Disposition: A | Discharge status: Home / Self Care | Payer: Self-pay | Attending: Emergency Medicine | Admitting: Emergency Medicine

## 2020-04-20 ENCOUNTER — Encounter (HOSPITAL_BASED_OUTPATIENT_CLINIC_OR_DEPARTMENT_OTHER): Payer: Self-pay

## 2020-04-20 DIAGNOSIS — K1370 Unspecified lesions of oral mucosa: Secondary | ICD-10-CM | POA: Insufficient documentation

## 2020-04-20 DIAGNOSIS — Z79899 Other long term (current) drug therapy: Secondary | ICD-10-CM | POA: Insufficient documentation

## 2020-04-20 NOTE — Discharge Instructions (Signed)
Please follow-up with your primary care doctor and an ear nose and throat doctor regarding your concerns for the spot on your tongue and lips.  If these areas worsen, you develop fevers, swelling or other new concerning symptom, return to ER for reassessment.

## 2020-04-20 NOTE — ED Triage Notes (Addendum)
Pt c/o lesion to tongue x 4 months-sores to bottom lip x 3-4 days-NAD-steady gait

## 2020-04-21 NOTE — ED Provider Notes (Signed)
[facility] EMERGENCY DEPARTMENT Provider Note   CSN: [id number] Arrival date & time: 04/20/20  1605     History Chief Complaint  Patient presents with  . Mouth Lesions    [name] is a 60 y.o. male.  Presents to the emergency room with concern for lip lesion.  Patient states that over the last couple years he has noted occasional spots on his tongue or lips that seem to come and go over weeks to months.  States that over the last few days he noted a spot on his lower lip and tongue that he would like evaluated.  They are somewhat painful.  No drainage.  No generalized swelling.  He denies any chronic medical conditions.  Per chart review see history of bipolar, cocaine.  HPI     Past Medical History:  Diagnosis Date  . Bipolar 1 disorder [facility])     Patient Active Problem List   Diagnosis Date Noted  . Anxiety and depression   . Bipolar affective disorder in remission (HCC) 01/16/2016  . Cocaine use disorder, mild, abuse (HCC) 12/31/2015    Past Surgical History:  Procedure Laterality Date  . EYE SURGERY         Family History  Problem Relation Age of Onset  . Hyperlipidemia Mother   . Alcoholism Mother   . Suicidality Cousin     Social History   Tobacco Use  . Smoking status: Never Smoker  . Smokeless tobacco: Never Used  Vaping Use  . Vaping Use: Never used  Substance Use Topics  . Alcohol use: Yes    Comment: weekly  . Drug use: Not Currently    Types: Cocaine    Home Medications Prior to Admission medications   Medication Sig Start Date End Date Taking? Authorizing Provider  carbamide peroxide (DEBROX) 6.5 % OTIC solution Place 5 drops into both ears 2 (two) times daily as needed (ear wax buildup). 07/05/19   [name], MD  ibuprofen (ADVIL) 600 MG tablet Take 1 tablet (600 mg total) by mouth every 6 (six) hours as needed. 12/31/19   [name], PA-C  lithium 300 MG tablet Take 1 tablet (300 mg total) by mouth 2 (two)  times daily for 28 doses. 09/17/17 10/01/17  [name], MD  methocarbamol (ROBAXIN) 500 MG tablet Take 1 tablet (500 mg total) by mouth 2 (two) times daily. 12/31/19   [name], PA-C  predniSONE (DELTASONE) 50 MG tablet Take 1 tablet (50 mg total) by mouth daily with breakfast. 12/31/19   [name], PA-C  QUEtiapine (SEROQUEL) 100 MG tablet Take 1 tablet (100 mg total) by mouth at bedtime. 09/17/17   [name], MD    Allergies    Patient has no known allergies.  Review of Systems   Review of Systems  Constitutional: Negative for chills and fever.  HENT: Positive for mouth sores. Negative for ear pain and sore throat.   Eyes: Negative for pain and visual disturbance.  Respiratory: Negative for cough and shortness of breath.   Cardiovascular: Negative for chest pain and palpitations.  Gastrointestinal: Negative for abdominal pain and vomiting.  Genitourinary: Negative for dysuria and hematuria.  Musculoskeletal: Negative for arthralgias and back pain.  Skin: Negative for color change and rash.  Neurological: Negative for seizures and syncope.  All other systems reviewed and are negative.   Physical Exam Updated Vital Signs BP (!) 182/102 (BP Location: Right Arm)   Pulse 72   Temp 98.7  F (37.1 C) (Oral)   Resp 20   Ht 5\' 10"  (1.778 m)   Wt 81.2 kg   SpO2 99%   BMI 25.68 kg/m   Physical Exam Vitals and nursing note reviewed.  Constitutional:      Appearance: He is well-developed and well-nourished.  HENT:     Head: Normocephalic and atraumatic.     Mouth/Throat:     Comments: Upon careful inspection of his mouth, buccal mucosa and tongue, these areas all appear grossly normal, except noted very small 3 mm diameter white lesion at base of right tongue and left lower lip.  Not painful, not indurated, no drainage.  No generalized white lesions throughout. Eyes:     Conjunctiva/sclera: Conjunctivae normal.  Cardiovascular:     Rate and Rhythm: Normal rate and  regular rhythm.     Heart sounds: No murmur heard.   Pulmonary:     Effort: Pulmonary effort is normal. No respiratory distress.     Breath sounds: Normal breath sounds.  Abdominal:     Palpations: Abdomen is soft.     Tenderness: There is no abdominal tenderness.  Musculoskeletal:        General: No edema.     Cervical back: Neck supple.  Skin:    General: Skin is warm and dry.     Capillary Refill: Capillary refill takes less than 2 seconds.  Neurological:     General: No focal deficit present.     Mental Status: He is alert.  Psychiatric:        Mood and Affect: Mood and affect normal.     ED Results / Procedures / Treatments   Labs (all labs ordered are listed, but only abnormal results are displayed) Labs Reviewed - No data to display  EKG None  Radiology No results found.  Procedures Procedures (including critical care time)  Medications Ordered in ED Medications - No data to display  ED Course  I have reviewed the triage vital signs and the nursing notes.  Pertinent labs & imaging results that were available during my care of the patient were reviewed by me and considered in my medical decision making (see chart for details).    MDM Rules/Calculators/A&P                           60 year old male presents to ER with concern for mouth lesion.  I did appreciate 2 very small white spots on his tongue and lip.  Based on exam, do not feel this is consistent with anything like leukoplakia or oral candidiasis.  These do not appear to be infectious, there is no generalized swelling and induration or drainage.  Believe these are relatively benign and can be followed up in the outpatient setting.  Recommended he follow-up with a primary care doctor as well as ENT.  Counseled on return precautions and discharged home.  After the discussed management above, the patient was determined to be safe for discharge.  The patient was in agreement with this plan and all  questions regarding their care were answered.  ED return precautions were discussed and the patient will return to the ED with any significant worsening of condition.   Final Clinical Impression(s) / ED Diagnoses Final diagnoses:  Mouth lesion    Rx / DC Orders ED Discharge Orders    None       60, MD 04/21/20 1100

## 2021-04-04 ENCOUNTER — Other Ambulatory Visit: Payer: Self-pay

## 2021-04-04 ENCOUNTER — Emergency Department (HOSPITAL_BASED_OUTPATIENT_CLINIC_OR_DEPARTMENT_OTHER)
Admission: EM | Admit: 2021-04-04 | Discharge: 2021-04-04 | Disposition: A | Discharge status: Home / Self Care | Payer: Self-pay | Attending: Emergency Medicine | Admitting: Emergency Medicine

## 2021-04-04 ENCOUNTER — Encounter (HOSPITAL_BASED_OUTPATIENT_CLINIC_OR_DEPARTMENT_OTHER): Payer: Self-pay

## 2021-04-04 DIAGNOSIS — H6122 Impacted cerumen, left ear: Secondary | ICD-10-CM | POA: Insufficient documentation

## 2021-04-04 NOTE — ED Triage Notes (Signed)
Pt states he feel left ear is stopped up x 1 week-NAD-steady gait

## 2021-04-04 NOTE — Discharge Instructions (Signed)
Please mix 1:1 water and hydrogen peroxide as ear wax removal once daily for this week and then continue maintaining this for once a week.

## 2021-04-04 NOTE — ED Provider Notes (Signed)
[facility] EMERGENCY DEPARTMENT Provider Note   CSN: [id number] Arrival date & time: 04/04/21  1352     History Chief Complaint  Patient presents with   Ear Fullness    [name] is a 60 y.o. male presenting to the ED complaining of left "ear fullness" He reports that he has a hx of cerebrum impaction requiring ED irrigation in the past. He states that he notices hearing difficulty and tinnitus in left ear x 1 week. He denies using earplugs, and reports that he occasionally uses Q tips. He denies headache, sinus pressure, confusion, lethargy, nasal congestion, rhinorrhea, SHOB, abd pain, leg swelling and CP.    Ear Fullness      Past Medical History:  Diagnosis Date   Bipolar 1 disorder [facility])     Patient Active Problem List   Diagnosis Date Noted   Anxiety and depression    Bipolar affective disorder in remission (HCC) 01/16/2016   Cocaine use disorder, mild, abuse (HCC) 12/31/2015    Past Surgical History:  Procedure Laterality Date   EYE SURGERY         Family History  Problem Relation Age of Onset   Hyperlipidemia Mother    Alcoholism Mother    Suicidality Cousin     Social History   Tobacco Use   Smoking status: Never   Smokeless tobacco: Never  Vaping Use   Vaping Use: Never used  Substance Use Topics   Alcohol use: Yes    Comment: weekly   Drug use: Not Currently    Home Medications Prior to Admission medications   Medication Sig Start Date End Date Taking? Authorizing Provider  carbamide peroxide (DEBROX) 6.5 % OTIC solution Place 5 drops into both ears 2 (two) times daily as needed (ear wax buildup). 07/05/19   [name], MD  ibuprofen (ADVIL) 600 MG tablet Take 1 tablet (600 mg total) by mouth every 6 (six) hours as needed. 12/31/19   [name], PA-C  lithium 300 MG tablet Take 1 tablet (300 mg total) by mouth 2 (two) times daily for 28 doses. 09/17/17 10/01/17  [name], MD  methocarbamol (ROBAXIN) 500 MG  tablet Take 1 tablet (500 mg total) by mouth 2 (two) times daily. 12/31/19   [name], PA-C  predniSONE (DELTASONE) 50 MG tablet Take 1 tablet (50 mg total) by mouth daily with breakfast. 12/31/19   [name], PA-C  QUEtiapine (SEROQUEL) 100 MG tablet Take 1 tablet (100 mg total) by mouth at bedtime. 09/17/17   [name], MD    Allergies    Patient has no known allergies.  Review of Systems   Review of Systems  Constitutional: Negative.   HENT:  Positive for hearing loss and tinnitus. Negative for congestion, dental problem, ear discharge, ear pain, facial swelling, nosebleeds, rhinorrhea, sinus pressure, sinus pain and sneezing.        Left ear hearing difficulty and tinnitus   Eyes: Negative.   Respiratory: Negative.    Cardiovascular: Negative.   Gastrointestinal: Negative.   Genitourinary: Negative.   Neurological: Negative.   Psychiatric/Behavioral: Negative.     Physical Exam Updated Vital Signs BP (!) 160/90 (BP Location: Left Arm)    Pulse 73    Temp 98.4 F (36.9 C) (Oral)    Resp 18    Ht 5\' 10"  (1.778 m)    Wt 78 kg    SpO2 100%    BMI 24.68 kg/m   Physical Exam Constitutional:  Appearance: Normal appearance.  HENT:     Head: Normocephalic and atraumatic.     Right Ear: Tympanic membrane normal. There is no impacted cerumen.     Left Ear: There is impacted cerumen.     Ears:     Comments: Left ear impacted with wax resulting in difficulty visualizing the TM. Able to visualize right TM, but minimal wax present.     Mouth/Throat:     Pharynx: Oropharynx is clear.  Eyes:     Extraocular Movements: Extraocular movements intact.     Pupils: Pupils are equal, round, and reactive to light.  Cardiovascular:     Rate and Rhythm: Normal rate and regular rhythm.     Pulses: Normal pulses.     Heart sounds: Normal heart sounds.  Pulmonary:     Effort: Pulmonary effort is normal.     Breath sounds: Normal breath sounds.  Abdominal:     General:  Abdomen is flat. Bowel sounds are normal.     Palpations: Abdomen is soft.  Musculoskeletal:     Cervical back: Normal range of motion.  Skin:    General: Skin is warm and dry.  Neurological:     General: No focal deficit present.     Mental Status: He is alert and oriented to person, place, and time. Mental status is at baseline.  Psychiatric:        Mood and Affect: Mood normal.        Behavior: Behavior normal.    ED Results / Procedures / Treatments   Labs (all labs ordered are listed, but only abnormal results are displayed) Labs Reviewed - No data to display  EKG None  Radiology No results found.  Procedures .Ear Cerumen Removal  Date/Time: 04/04/2021 4:29 PM Performed by: Estrella Deeds, OD Authorized by: Charlynne Pander, MD   Consent:    Consent obtained:  Verbal   Consent given by:  Patient   Risks, benefits, and alternatives were discussed: yes     Risks discussed:  Pain, incomplete removal and bleeding   Alternatives discussed:  No treatment Universal protocol:    Procedure explained and questions answered to patient or proxy's satisfaction: yes     Relevant documents present and verified: yes     Test results available: no     Imaging studies available: no     Required blood products, implants, devices, and special equipment available: no     Site/side marked: no     Immediately prior to procedure, a time out was called: no     Patient identity confirmed:  Verbally with patient Procedure details:    Location:  L ear   Procedure type: curette     Procedure outcomes: cerumen removed   Post-procedure details:    Inspection:  Ear canal clear and no bleeding   Hearing quality:  Improved   Procedure completion:  Tolerated   Medications Ordered in ED Medications - No data to display  ED Course  I have reviewed the triage vital signs and the nursing notes.  Pertinent labs & imaging results that were available during my care of the patient were  reviewed by me and considered in my medical decision making (see chart for details).    MDM Rules/Calculators/A&P                         Vitals with BP 160/90. Pt reports he recently started taking BP med (metoprolol) x 1 month  ago. Denies SHOB, CP, headache, and confusion. Exam consistent with cerumen impaction of left ear. Left ear was disimpacted with curette and pt reports immediate resolution of hearing difficulty and tinnitus. Pt is uninsured and requesting affordable medication. He is advised to use peroxide and water mix several times a week. ENT referral not beneficial in this pt. He is educated on proper ear cleaning techniques. He is advised to establish care with a PCP for better BP control. He expresses understanding.        Final Clinical Impression(s) / ED Diagnoses Final diagnoses:  None    Rx / DC Orders ED Discharge Orders     None        Carmel Sacramento, MD 04/04/21 1635    Charlynne Pander, MD 04/08/21 1919

## 2021-12-22 ENCOUNTER — Other Ambulatory Visit: Payer: Self-pay

## 2021-12-22 ENCOUNTER — Emergency Department (HOSPITAL_BASED_OUTPATIENT_CLINIC_OR_DEPARTMENT_OTHER)
Admission: EM | Admit: 2021-12-22 | Discharge: 2021-12-22 | Disposition: A | Discharge status: Home / Self Care | Payer: Self-pay | Attending: Emergency Medicine | Admitting: Emergency Medicine

## 2021-12-22 ENCOUNTER — Encounter (HOSPITAL_BASED_OUTPATIENT_CLINIC_OR_DEPARTMENT_OTHER): Payer: Self-pay | Admitting: Emergency Medicine

## 2021-12-22 DIAGNOSIS — K1370 Unspecified lesions of oral mucosa: Secondary | ICD-10-CM | POA: Insufficient documentation

## 2021-12-22 MED ORDER — LIDOCAINE VISCOUS HCL 2 % MT SOLN: 15.0000 mL | OROMUCOSAL | 0 refills | Status: DC | PRN

## 2021-12-22 NOTE — ED Notes (Signed)
Mouth ulcer x 1 month, has been swishing out mouth with  H2O2   has been putting off  getting it looked out

## 2021-12-22 NOTE — ED Provider Notes (Signed)
[facility] EMERGENCY DEPARTMENT Provider Note   CSN: [id number] Arrival date & time: 12/22/21  1651    History  Chief Complaint  Patient presents with   Mouth Lesions    [name] is a 61 y.o. male  Mouth ulcer x 5-6 weeks, right posterior lip. Unsure if bite lip. Painful to touch. Growing in size. No drainage. Overall poor dentition with multiple missing teeth. No fever, facial swelling, rash to palms or soles. No meds at home on a regular basis.  No hx of similar No hx of tobacco    HPI     Home Medications Prior to Admission medications   Medication Sig Start Date End Date Taking? Authorizing Provider  lidocaine (XYLOCAINE) 2 % solution Use as directed 15 mLs in the mouth or throat as needed for mouth pain. 12/22/21  Yes [name] A, PA-C  carbamide peroxide (DEBROX) 6.5 % OTIC solution Place 5 drops into both ears 2 (two) times daily as needed (ear wax buildup). 07/05/19   [name], MD  ibuprofen (ADVIL) 600 MG tablet Take 1 tablet (600 mg total) by mouth every 6 (six) hours as needed. 12/31/19   [name], PA-C  lithium 300 MG tablet Take 1 tablet (300 mg total) by mouth 2 (two) times daily for 28 doses. 09/17/17 10/01/17  [name], MD  methocarbamol (ROBAXIN) 500 MG tablet Take 1 tablet (500 mg total) by mouth 2 (two) times daily. 12/31/19   [name], PA-C  predniSONE (DELTASONE) 50 MG tablet Take 1 tablet (50 mg total) by mouth daily with breakfast. 12/31/19   [name], PA-C  QUEtiapine (SEROQUEL) 100 MG tablet Take 1 tablet (100 mg total) by mouth at bedtime. 09/17/17   [name], MD      Allergies    Patient has no known allergies.    Review of Systems   Review of Systems  Constitutional: Negative.   HENT:         Inner lip wound  Respiratory: Negative.    Cardiovascular: Negative.   Gastrointestinal: Negative.   Genitourinary: Negative.   Musculoskeletal: Negative.   Skin: Negative.   Neurological:  Negative.   All other systems reviewed and are negative.   Physical Exam Updated Vital Signs BP (!) 147/85 (BP Location: Right Arm)   Pulse 69   Temp 98.7 F (37.1 C) (Oral)   Resp 20   Ht 5\' 10"  (1.778 m)   Wt 74.8 kg   SpO2 100%   BMI 23.68 kg/m  Physical Exam Vitals and nursing note reviewed.  Constitutional:      General: He is not in acute distress.    Appearance: He is well-developed. He is not ill-appearing, toxic-appearing or diaphoretic.  HENT:     Head: Normocephalic and atraumatic.     Mouth/Throat:     Comments: Poor dentition, multiple missing teeth.  Right upper inner lip with 5 mm rounded lesion, posterior aspect appears to have a bite wound however somewhat of a cauliflower type appearance to central wound.  No erythema, drainage. Eyes:     Pupils: Pupils are equal, round, and reactive to light.  Cardiovascular:     Rate and Rhythm: Normal rate and regular rhythm.  Pulmonary:     Effort: Pulmonary effort is normal. No respiratory distress.  Abdominal:     General: There is no distension.     Palpations: Abdomen is soft.  Musculoskeletal:        General: Normal  range of motion.     Cervical back: Normal range of motion and neck supple.  Skin:    General: Skin is warm and dry.  Neurological:     General: No focal deficit present.     Mental Status: He is alert and oriented to person, place, and time.     ED Results / Procedures / Treatments   Labs (all labs ordered are listed, but only abnormal results are displayed) Labs Reviewed - No data to display  EKG None  Radiology No results found.  Procedures Procedures    Medications Ordered in ED Medications - No data to display  ED Course/ Medical Decision Making/ A&P    61 year old here for evaluation of right upper inner lip mass which has been present for greater than 1 month.  He is a nontobacco user.  No history of similar.  He is overall poor dentition, multiple missing teeth.  He  denies any known trauma, lip biting.  No meds at home.  Wound is painful.  No facial swelling.  No tobacco use.  Has a 5 mm rounded lesion to inner upper right lip.  Part of it appears to have possible trauma, bite wound to however also has somewhat of a cauliflower type appearance.  Denies any tobacco use.   Possibly could be traumatic injury however due to appearance of the distal aspect feel he would likely benefit from biopsy to rule out malignancy given length of time of wound.  Does not appear actively infected do not think he needs antibiotics.  Will DC home with some topical medication to help with pain.  He is encouraged to follow-up outpatient for his biopsy.  The patient has been appropriately medically screened and/or stabilized in the ED. I have low suspicion for any other emergent medical condition which would require further screening, evaluation or treatment in the ED or require inpatient management.  Patient is hemodynamically stable and in no acute distress.  Patient able to ambulate in department prior to ED.  Evaluation does not show acute pathology that would require ongoing or additional emergent interventions while in the emergency department or further inpatient treatment.  I have discussed the diagnosis with the patient and answered all questions.  Pain is been managed while in the emergency department and patient has no further complaints prior to discharge.  Patient is comfortable with plan discussed in room and is stable for discharge at this time.  I have discussed strict return precautions for returning to the emergency department.  Patient was encouraged to follow-up with PCP/specialist refer to at discharge.                           Medical Decision Making Amount and/or Complexity of Data Reviewed Independent Historian: spouse External Data Reviewed: notes.           Final Clinical Impression(s) / ED Diagnoses Final diagnoses:  Mouth lesion    Rx / DC  Orders ED Discharge Orders          Ordered    lidocaine (XYLOCAINE) 2 % solution  As needed        12/22/21 1907              Miray Mancino A, PA-C 12/22/21 1907    Melene Plan, DO 12/22/21 2229

## 2021-12-22 NOTE — Discharge Instructions (Signed)
Dentistry would probably be the likely person to biopsy this lesion.  I have given you resources.  May also do: Call other dentist office to see if they biopsy mouth lesions.  If not I have placed a number to an ear nose and throat provider.  You may call to schedule appointment

## 2021-12-22 NOTE — ED Triage Notes (Signed)
Pt arrive pov, steady gait, c/o mouth sore upper RT x 1 month. Denies fever

## 2022-04-13 ENCOUNTER — Ambulatory Visit: Payer: Self-pay | Admitting: Internal Medicine

## 2022-08-19 ENCOUNTER — Encounter (HOSPITAL_BASED_OUTPATIENT_CLINIC_OR_DEPARTMENT_OTHER): Payer: Self-pay | Admitting: Emergency Medicine

## 2022-08-19 ENCOUNTER — Other Ambulatory Visit: Payer: Self-pay

## 2022-08-19 ENCOUNTER — Emergency Department (HOSPITAL_BASED_OUTPATIENT_CLINIC_OR_DEPARTMENT_OTHER)
Admission: EM | Admit: 2022-08-19 | Discharge: 2022-08-19 | Disposition: A | Discharge status: Home / Self Care | Payer: 59 | Attending: Emergency Medicine | Admitting: Emergency Medicine

## 2022-08-19 DIAGNOSIS — X58XXXA Exposure to other specified factors, initial encounter: Secondary | ICD-10-CM | POA: Diagnosis not present

## 2022-08-19 DIAGNOSIS — K029 Dental caries, unspecified: Secondary | ICD-10-CM | POA: Diagnosis not present

## 2022-08-19 DIAGNOSIS — I10 Essential (primary) hypertension: Secondary | ICD-10-CM | POA: Diagnosis not present

## 2022-08-19 DIAGNOSIS — K0889 Other specified disorders of teeth and supporting structures: Secondary | ICD-10-CM

## 2022-08-19 DIAGNOSIS — S025XXA Fracture of tooth (traumatic), initial encounter for closed fracture: Secondary | ICD-10-CM | POA: Diagnosis not present

## 2022-08-19 MED ORDER — HYDROCODONE-ACETAMINOPHEN 5-325 MG PO TABS
1.0000 | ORAL_TABLET | Freq: Once | ORAL | Status: AC
  Administered 2022-08-19: 1 via ORAL
  Filled 2022-08-19: qty 1

## 2022-08-19 MED ORDER — AMOXICILLIN-POT CLAVULANATE 875-125 MG PO TABS: 1.0000 | ORAL_TABLET | Freq: Two times a day (BID) | ORAL | 0 refills | Status: DC

## 2022-08-19 MED ORDER — AMOXICILLIN-POT CLAVULANATE 875-125 MG PO TABS
1.0000 | ORAL_TABLET | Freq: Once | ORAL | Status: AC
  Administered 2022-08-19: 1 via ORAL
  Filled 2022-08-19: qty 1

## 2022-08-19 NOTE — ED Provider Notes (Signed)
[name] EMERGENCY DEPARTMENT AT [facility] Provider Note   CSN: [id number] Arrival date & time: 08/19/22  1433     History  Chief Complaint  Patient presents with   Dental Pain    [name] is a 62 y.o. male.   Dental Pain   62 year old male presents emergency department complaints of right lower dental pain.  Patient states he has known history of fractured tooth in affected area.  He states his symptoms began 4 to 5 days ago.  Has been taking at home Motrin as well as Tylenol which has helped with symptoms.  States he has been able to tolerate p.o. without difficulty.  States he has not seen dentistry for said symptoms.  Denies difficulty breathing/swallowing/feelings of throat closing in on him.  Denies fever, chills, night sweats, chest pain, shortness of breath, vomiting, nausea, vomiting, urinary symptoms, change in bowel habits.  Past medical history significant for bipolar 1 disorder, cocaine use disorder, anxiety/depression  Home Medications Prior to Admission medications   Medication Sig Start Date End Date Taking? Authorizing Provider  amoxicillin-clavulanate (AUGMENTIN) 875-125 MG tablet Take 1 tablet by mouth every 12 (twelve) hours. 08/20/22  Yes Sherian Maroon A, PA  carbamide peroxide (DEBROX) 6.5 % OTIC solution Place 5 drops into both ears 2 (two) times daily as needed (ear wax buildup). 07/05/19   [name], MD  ibuprofen (ADVIL) 600 MG tablet Take 1 tablet (600 mg total) by mouth every 6 (six) hours as needed. 12/31/19   [name], PA-C  lidocaine (XYLOCAINE) 2 % solution Use as directed 15 mLs in the mouth or throat as needed for mouth pain. 12/22/21   Henderly, Britni A, PA-C  lithium 300 MG tablet Take 1 tablet (300 mg total) by mouth 2 (two) times daily for 28 doses. 09/17/17 10/01/17  [name], MD  methocarbamol (ROBAXIN) 500 MG tablet Take 1 tablet (500 mg total) by mouth 2 (two) times daily. 12/31/19   [name], PA-C   predniSONE (DELTASONE) 50 MG tablet Take 1 tablet (50 mg total) by mouth daily with breakfast. 12/31/19   [name], PA-C  QUEtiapine (SEROQUEL) 100 MG tablet Take 1 tablet (100 mg total) by mouth at bedtime. 09/17/17   [name], MD      Allergies    Patient has no known allergies.    Review of Systems   Review of Systems  All other systems reviewed and are negative.   Physical Exam Updated Vital Signs BP (!) 154/87 (BP Location: Right Arm)   Pulse 96   Temp 98.2 F (36.8 C)   Resp 18   Ht 5\' 10"  (1.778 m)   Wt 77.1 kg   SpO2 100%   BMI 24.39 kg/m  Physical Exam Vitals and nursing note reviewed.  Constitutional:      General: He is not in acute distress.    Appearance: He is well-developed.  HENT:     Head: Normocephalic and atraumatic.     Mouth/Throat:      Comments: Patient with fractured tooth in area indicated above.  Multiple caries and fractured teeth appreciated throughout dentition.  Gingival tenderness to palpation.  No obvious or palpable fluctuance along gingiva.  Uvula midline rise symmetric with phonation.  No sublingual or submandibular swelling appreciated.  Patient able to open mandible completely but with some pain on the right side.  Eyes:     Conjunctiva/sclera: Conjunctivae normal.  Cardiovascular:     Rate and  Rhythm: Normal rate and regular rhythm.     Heart sounds: No murmur heard. Pulmonary:     Effort: Pulmonary effort is normal. No respiratory distress.     Breath sounds: Normal breath sounds. No wheezing, rhonchi or rales.  Abdominal:     Palpations: Abdomen is soft.     Tenderness: There is no abdominal tenderness.  Musculoskeletal:        General: No swelling.     Cervical back: Neck supple.  Skin:    General: Skin is warm and dry.     Capillary Refill: Capillary refill takes less than 2 seconds.  Neurological:     Mental Status: He is alert.  Psychiatric:        Mood and Affect: Mood normal.     ED Results /  Procedures / Treatments   Labs (all labs ordered are listed, but only abnormal results are displayed) Labs Reviewed - No data to display  EKG None  Radiology No results found.  Procedures Procedures    Medications Ordered in ED Medications  HYDROcodone-acetaminophen (NORCO/VICODIN) 5-325 MG per tablet 1 tablet (1 tablet Oral Given 08/19/22 1453)  amoxicillin-clavulanate (AUGMENTIN) 875-125 MG per tablet 1 tablet (1 tablet Oral Given 08/19/22 1453)    ED Course/ Medical Decision Making/ A&P                             Medical Decision Making Risk Prescription drug management.   This patient presents to the ED for concern of dental pain, this involves an extensive number of treatment options, and is a complaint that carries with it a high risk of complications and morbidity.  The differential diagnosis includes Ludwig angina, periapical abscess, peritonsillar abscess, Mnire's disease, necrotizing ulcerative gingivitis   Co morbidities that complicate the patient evaluation  See HPI   Additional history obtained:  Additional history obtained from EMR External records from outside source obtained and reviewed including hospital records   Lab Tests:  N/a   Imaging Studies ordered:  N/a   Cardiac Monitoring: / EKG:  The patient was maintained on a cardiac monitor.  I personally viewed and interpreted the cardiac monitored which showed an underlying rhythm of: Sinus rhythm   Consultations Obtained:  N/a   Problem List / ED Course / Critical interventions / Medication management  Dental pain I ordered medication including Augmentin, Norco   Reevaluation of the patient after these medicines showed that the patient improved I have reviewed the patients home medicines and have made adjustments as needed   Social Determinants of Health:  Denies tobacco use.  History of cocaine use   Test / Admission - Considered:  Dental pain Vitals signs significant  for mild hypertension with blood pressure 154/87. Otherwise within normal range and stable throughout visit. 62 year old male presents emergency department with complaints of dental pain.  Patient with evidence of swelling and tenderness along gingiva of affected fractured tooth.  No evidence of Ludwig angina, periapical abscess, peritonsillar abscess.  Patient tolerating oral secretions without difficulty with no evidence of trismus, auscultatory stridor.  Patient was originally eating cookout milkshake when first evaluated.  Will place patient on oral antibiotics outpatient and recommend pain control with Tylenol/Motrin.  Dental referral sent as well as patient given outpatient dentistry information for follow-up.  Treatment plan discussed at length with patient and he acknowledged understanding was agreeable to said plan. Worrisome signs and symptoms were discussed with the patient, and the  patient acknowledged understanding to return to the ED if noticed. Patient was stable upon discharge.          Final Clinical Impression(s) / ED Diagnoses Final diagnoses:  Pain, dental    Rx / DC Orders ED Discharge Orders          Ordered    amoxicillin-clavulanate (AUGMENTIN) 875-125 MG tablet  Every 12 hours        08/19/22 1449    Ambulatory referral to Dentistry       Comments: Dental pain   08/19/22 1449              Peter Garter, [place] 08/19/22 1503    Terald Sleeper, MD 08/19/22 1510

## 2022-08-19 NOTE — Discharge Instructions (Signed)
Note the workup today was overall reassuring.  As discussed, we will send antibiotic to use twice daily as directed.  Recommend continued use of ibuprofen/Tylenol at home for pain.  I have sent in a dental referral as well as attached resource guide for dentistry in the area.  Please call as likelihood of recurrence is high given status of your teeth.  Please do not hesitate to return to emergency department for worrisome signs and symptoms we discussed become apparent.

## 2022-08-19 NOTE — ED Triage Notes (Signed)
Pt c/o dental pain (RT lower) since Tues

## 2022-08-24 ENCOUNTER — Emergency Department (HOSPITAL_BASED_OUTPATIENT_CLINIC_OR_DEPARTMENT_OTHER)
Admission: EM | Admit: 2022-08-24 | Discharge: 2022-08-24 | Disposition: A | Discharge status: Home / Self Care | Payer: 59 | Attending: Emergency Medicine | Admitting: Emergency Medicine

## 2022-08-24 ENCOUNTER — Other Ambulatory Visit: Payer: Self-pay

## 2022-08-24 ENCOUNTER — Encounter (HOSPITAL_BASED_OUTPATIENT_CLINIC_OR_DEPARTMENT_OTHER): Payer: Self-pay | Admitting: Emergency Medicine

## 2022-08-24 DIAGNOSIS — Z203 Contact with and (suspected) exposure to rabies: Secondary | ICD-10-CM | POA: Diagnosis not present

## 2022-08-24 DIAGNOSIS — Z2914 Encounter for prophylactic rabies immune globin: Secondary | ICD-10-CM | POA: Insufficient documentation

## 2022-08-24 DIAGNOSIS — S71131A Puncture wound without foreign body, right thigh, initial encounter: Secondary | ICD-10-CM | POA: Insufficient documentation

## 2022-08-24 DIAGNOSIS — S51832A Puncture wound without foreign body of left forearm, initial encounter: Secondary | ICD-10-CM | POA: Diagnosis present

## 2022-08-24 DIAGNOSIS — Z23 Encounter for immunization: Secondary | ICD-10-CM | POA: Insufficient documentation

## 2022-08-24 DIAGNOSIS — W540XXA Bitten by dog, initial encounter: Secondary | ICD-10-CM | POA: Diagnosis not present

## 2022-08-24 MED ORDER — AMOXICILLIN-POT CLAVULANATE 875-125 MG PO TABS: 1.0000 | ORAL_TABLET | Freq: Two times a day (BID) | ORAL | 0 refills | Status: AC

## 2022-08-24 MED ORDER — HYDROCODONE-ACETAMINOPHEN 5-325 MG PO TABS
1.0000 | ORAL_TABLET | Freq: Once | ORAL | Status: AC
  Administered 2022-08-24: 1 via ORAL
  Filled 2022-08-24: qty 1

## 2022-08-24 MED ORDER — RABIES IMMUNE GLOBULIN 150 UNIT/ML IM INJ
20.0000 [IU]/kg | INJECTION | Freq: Once | INTRAMUSCULAR | Status: AC
  Administered 2022-08-24: 1500 [IU]
  Filled 2022-08-24: qty 10

## 2022-08-24 MED ORDER — RABIES VACCINE, PCEC IM SUSR
1.0000 mL | Freq: Once | INTRAMUSCULAR | Status: AC
  Administered 2022-08-24: 1 mL via INTRAMUSCULAR
  Filled 2022-08-24: qty 1

## 2022-08-24 NOTE — Discharge Instructions (Addendum)
Thank you for allowing me to be a part of your care today.    Postexposure prophylaxis (PEP) consists of a dose of human rabies immune globulin (HRIG) and rabies vaccine given on the day of the rabies exposure, and then a dose of vaccine given again on days 3, 7, and 14.  Day 3: 08/27/22 Day 7: 08/31/22 Day 14: 09/07/22 You may return to this ED to receive the remaining vaccines, or you may go to the [facility] that is on hospital property.    I have sent over more Augmentin to your pharmacy to cover for dog bite.  Continue taking your current prescription as prescribed, and then begin taking the new prescription when you have completed the first.  This should cover your for the entire dog bite infection prophylaxis time frame.   Monitor your wounds for signs of infection including redness, swelling, increased warmth, drainage, or increased pain.  If infection occurs, please have your bites re-evaluated.  I recommend taking 800 mg of ibuprofen every 6-8 hours as needed for pain.  DO NOT exceed 3200 mg of ibuprofen in a 24-hour period.   Return to the ED if you develop sudden worsening of your symptoms or if you have any new concerns.

## 2022-08-24 NOTE — ED Provider Notes (Signed)
[place] [facility] Provider Note   CSN: [id number] Arrival date & time: 08/24/22  1904     History  Chief Complaint  Patient presents with   Animal Bite    [name] is a 62 y.o. male presents to the ED complaining of dog bite that occurred approximately 90 minutes prior to arrival.  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  Patient with puncture wounds to the left forearm and anterior right thigh.  Animal control was contacted prior to arrival.  Patient does not know this dog and does not know if he is updated on vaccinations.  Patient reports he is up-to-date on his tetanus.       Home Medications Prior to Admission medications   Medication Sig Start Date End Date Taking? Authorizing Provider  amoxicillin-clavulanate (AUGMENTIN) 875-125 MG tablet Take 1 tablet by mouth every 12 (twelve) hours for 4 days. 08/24/22 08/28/22  Miray Mancino R, PA-C  carbamide peroxide (DEBROX) 6.5 % OTIC solution Place 5 drops into both ears 2 (two) times daily as needed (ear wax buildup). 07/05/19   [name], MD  ibuprofen (ADVIL) 600 MG tablet Take 1 tablet (600 mg total) by mouth every 6 (six) hours as needed. 12/31/19   [name], PA-C  lidocaine (XYLOCAINE) 2 % solution Use as directed 15 mLs in the mouth or throat as needed for mouth pain. 12/22/21   Henderly, Britni A, PA-C  lithium 300 MG tablet Take 1 tablet (300 mg total) by mouth 2 (two) times daily for 28 doses. 09/17/17 10/01/17  [name], MD  methocarbamol (ROBAXIN) 500 MG tablet Take 1 tablet (500 mg total) by mouth 2 (two) times daily. 12/31/19   [name], PA-C  predniSONE (DELTASONE) 50 MG tablet Take 1 tablet (50 mg total) by mouth daily with breakfast. 12/31/19   [name], PA-C  QUEtiapine (SEROQUEL) 100 MG tablet Take 1 tablet (100 mg total) by mouth at bedtime. 09/17/17   [name], MD      Allergies    Patient has no known allergies.    Review of Systems   Review of Systems  Musculoskeletal:  Negative for arthralgias and joint swelling.  Skin:  Positive for wound.    Physical Exam Updated Vital Signs BP (!) 155/95 (BP Location: Right Arm)   Pulse 79   Temp 98.6 F (37 C) (Oral)   Resp (!) 24   Ht 5\' 10"  (1.778 m)   Wt 73.9 kg   SpO2 97%   BMI 23.39 kg/m  Physical Exam Vitals and nursing note reviewed.  Constitutional:      General: He is not in acute distress.    Appearance: Normal appearance. He is not ill-appearing or diaphoretic.  Cardiovascular:     Rate and Rhythm: Normal rate and regular rhythm.  Pulmonary:     Effort: Pulmonary effort is normal.  Musculoskeletal:     Left forearm: Tenderness present. No swelling, edema, lacerations or bony tenderness.     Right upper leg: Swelling and tenderness present. No lacerations or bony tenderness.     Comments: Two small puncture wounds to the forearm.  Able to visualize entire wound bed, they do not appear to be deep punctures.  No deformity to the forearm, no swelling or ecchymosis.  There are 2 puncture wounds to the medial right  thigh with swelling.  No ecchymosis, erythema, or bleeding.  Area is tender to palpation.  Patient is able to walk with steady gait.  Neurological:     Mental Status: He is alert. Mental status is at baseline.  Psychiatric:        Mood and Affect: Mood normal.        Behavior: Behavior normal.     ED Results / Procedures / Treatments   Labs (all labs ordered are listed, but only abnormal results are displayed) Labs Reviewed - No data to display  EKG None  Radiology No results found.  Procedures Procedures    Medications Ordered in ED Medications  rabies immune globulin (HYPERRAB/KEDRAB) injection 1,500 Units (has no administration in time range)  rabies vaccine (RABAVERT) injection 1 mL (has no administration in time range)   HYDROcodone-acetaminophen (NORCO/VICODIN) 5-325 MG per tablet 1 tablet (1 tablet Oral Given 08/24/22 2050)    ED Course/ Medical Decision Making/ A&P                             Medical Decision Making Risk Prescription drug management.   This patient presents to the ED with chief complaint(s) of dog bite. The complaint involves an extensive differential diagnosis and also carries with it a high risk of complications and morbidity.    Initial Assessment:   Exam significant for 2 small puncture wounds to the left forearm, able to visualize the entire wound bed, they do not appear to be deep punctures.  No deformity to the left forearm, no swelling, or ecchymosis.  There are also 2 puncture wounds to the medial right thigh with swelling.  No ecchymosis, erythema, or bleeding.  Area is tender to palpation.  Patient is able to walk with a steady gait.  Treatment and Reassessment: Discussed with patient that because he does not know the status of the dog's vaccinations, rabies vaccination is recommended.  After a shared decision-making conversation, patient has opted for rabies vaccination series.  Discussed with patient the need to return on specific dates to receive the rest of the vaccine series.  Patient verbalizes his understanding.  Disposition:   Patient is on Augmentin for dental infection and is on day 5 of 10.  Will prescribe more Augmentin to cover for the period of dog bite prophylaxis.  Discussed wound care with patient and signs of infection to monitor for.  The patient has been appropriately medically screened and/or stabilized in the ED. I have low suspicion for any other emergent medical condition which would require further screening, evaluation or treatment in the ED or require inpatient management. At time of discharge the patient is hemodynamically stable and in no acute distress. I have discussed work-up results and diagnosis with patient and answered all questions. Patient is  agreeable with discharge plan. We discussed strict return precautions for returning to the emergency department and they verbalized understanding.            Final Clinical Impression(s) / ED Diagnoses Final diagnoses:  Dog bite, initial encounter    Rx / DC Orders ED Discharge Orders          Ordered    amoxicillin-clavulanate (AUGMENTIN) 875-125 MG tablet  Every 12 hours        08/24/22 2100              Lenard Simmer, PA-C 08/24/22 2342    Rondel Baton, MD 08/25/22 1321

## 2022-08-24 NOTE — ED Triage Notes (Signed)
Patient arrived via POV c/o animal bite x 90 min pta. Patient states being bit in right thigh and left arm by pitbull. Patient bleeding well controlled. Patient has puncture wounds to interior right thigh. Animal control called previous to arrival. Patient is AO x 4, VS w/ elevated bp, normal gait.

## 2022-10-04 ENCOUNTER — Encounter (HOSPITAL_BASED_OUTPATIENT_CLINIC_OR_DEPARTMENT_OTHER): Payer: Self-pay

## 2022-10-04 ENCOUNTER — Other Ambulatory Visit: Payer: Self-pay

## 2022-10-04 ENCOUNTER — Emergency Department (HOSPITAL_BASED_OUTPATIENT_CLINIC_OR_DEPARTMENT_OTHER)
Admission: EM | Admit: 2022-10-04 | Discharge: 2022-10-04 | Disposition: A | Discharge status: Home / Self Care | Payer: 59 | Attending: Emergency Medicine | Admitting: Emergency Medicine

## 2022-10-04 DIAGNOSIS — R200 Anesthesia of skin: Secondary | ICD-10-CM | POA: Diagnosis not present

## 2022-10-04 DIAGNOSIS — Z4800 Encounter for change or removal of nonsurgical wound dressing: Secondary | ICD-10-CM | POA: Insufficient documentation

## 2022-10-04 DIAGNOSIS — Z5189 Encounter for other specified aftercare: Secondary | ICD-10-CM

## 2022-10-04 NOTE — ED Provider Notes (Signed)
[place] [facility]  Provider Note  CSN: [id number] Arrival date & time: 10/04/22 0031  History Chief Complaint  Patient presents with   Leg Problem    [name] is a 62 y.o. male reports he was bitten by a dog on May 9, seen here and given Augmentin and initial Rabies vaccine. Apparently animal control was able to confirm the dog was vaccinated and he did not need any further vaccines in that series. He presents tonight because he states he still has some numbness around a persistent wound on his R medial thigh. No drainage or fevers.    Home Medications Prior to Admission medications   Medication Sig Start Date End Date Taking? Authorizing Provider  carbamide peroxide (DEBROX) 6.5 % OTIC solution Place 5 drops into both ears 2 (two) times daily as needed (ear wax buildup). 07/05/19   [name], MD  ibuprofen (ADVIL) 600 MG tablet Take 1 tablet (600 mg total) by mouth every 6 (six) hours as needed. 12/31/19   [name], PA-C  lidocaine (XYLOCAINE) 2 % solution Use as directed 15 mLs in the mouth or throat as needed for mouth pain. 12/22/21   Henderly, Britni A, PA-C  lithium 300 MG tablet Take 1 tablet (300 mg total) by mouth 2 (two) times daily for 28 doses. 09/17/17 10/01/17  [name], MD  methocarbamol (ROBAXIN) 500 MG tablet Take 1 tablet (500 mg total) by mouth 2 (two) times daily. 12/31/19   [name], PA-C  predniSONE (DELTASONE) 50 MG tablet Take 1 tablet (50 mg total) by mouth daily with breakfast. 12/31/19   [name], PA-C  QUEtiapine (SEROQUEL) 100 MG tablet Take 1 tablet (100 mg total) by mouth at bedtime. 09/17/17   [name], MD     Allergies    Patient has no known allergies.   Review of Systems   Review of Systems Please see HPI for pertinent positives and negatives  Physical Exam BP (!) 173/102 (BP Location: Left Arm)   Pulse (!) 57   Temp 98 F (36.7 C)   Resp 18   Ht 5\' 10"  (1.778 m)    Wt 74 kg   SpO2 100%   BMI 23.41 kg/m   Physical Exam Vitals and nursing note reviewed.  HENT:     Head: Normocephalic.     Nose: Nose normal.  Eyes:     Extraocular Movements: Extraocular movements intact.  Pulmonary:     Effort: Pulmonary effort is normal.  Musculoskeletal:        General: Normal range of motion.     Cervical back: Neck supple.  Skin:    Findings: No rash (on exposed skin).     Comments: Healing wound on R medial thigh, no signs of infection  Neurological:     Mental Status: He is alert and oriented to person, place, and time.  Psychiatric:        Mood and Affect: Mood normal.     ED Results / Procedures / Treatments   EKG None  Procedures Procedures  Medications Ordered in the ED Medications - No data to display  Initial Impression and Plan  Patient reassured that his wound appears to be healing as expected and that it may take some more time for the wound to close by secondary intention. No signs of infection or other acute complication. Patient advised to continue with local wound care. PCP follow up, RTED for any other  concerns.    ED Course       MDM Rules/Calculators/A&P Medical Decision Making Problems Addressed: Visit for wound check: acute illness or injury     Final Clinical Impression(s) / ED Diagnoses Final diagnoses:  Visit for wound check    Rx / DC Orders ED Discharge Orders     None        Pollyann Savoy, MD 10/04/22 [id number]

## 2022-10-04 NOTE — ED Triage Notes (Signed)
Pt reports he was seen for an animal bite in May and is now here because his RT thigh is numb around the area where he got bit and he also reports that it is not healing up the way "they said it would". Pt ambulatory with even and steady gait. Pt reports discoloration to his RT thigh.

## 2022-10-28 ENCOUNTER — Emergency Department (HOSPITAL_COMMUNITY)
Admission: EM | Admit: 2022-10-28 | Discharge: 2022-10-29 | Disposition: A | Discharge status: Other Acute Inpatient Hospital | Payer: 59 | Attending: Emergency Medicine | Admitting: Emergency Medicine

## 2022-10-28 ENCOUNTER — Encounter (HOSPITAL_COMMUNITY): Payer: Self-pay

## 2022-10-28 ENCOUNTER — Other Ambulatory Visit: Payer: Self-pay

## 2022-10-28 DIAGNOSIS — F319 Bipolar disorder, unspecified: Secondary | ICD-10-CM | POA: Insufficient documentation

## 2022-10-28 DIAGNOSIS — Z79899 Other long term (current) drug therapy: Secondary | ICD-10-CM | POA: Insufficient documentation

## 2022-10-28 DIAGNOSIS — F309 Manic episode, unspecified: Secondary | ICD-10-CM | POA: Diagnosis present

## 2022-10-28 DIAGNOSIS — F29 Unspecified psychosis not due to a substance or known physiological condition: Secondary | ICD-10-CM | POA: Insufficient documentation

## 2022-10-28 DIAGNOSIS — G4700 Insomnia, unspecified: Secondary | ICD-10-CM | POA: Diagnosis not present

## 2022-10-28 DIAGNOSIS — R45851 Suicidal ideations: Secondary | ICD-10-CM | POA: Insufficient documentation

## 2022-10-28 LAB — COMPREHENSIVE METABOLIC PANEL
ALT: 31 U/L (ref 0–44)
AST: 38 U/L (ref 15–41)
Albumin: 4 g/dL (ref 3.5–5.0)
Alkaline Phosphatase: 75 U/L (ref 38–126)
Anion gap: 9 (ref 5–15)
BUN: 15 mg/dL (ref 6–20)
CO2: 23 mmol/L (ref 22–32)
Calcium: 9.1 mg/dL (ref 8.9–10.3)
Chloride: 105 mmol/L (ref 98–111)
Creatinine, Ser: 1.31 mg/dL — ABNORMAL HIGH (ref 0.61–1.24)
GFR, Estimated: >60 mL/min (ref >60)
Glucose, Bld: 112 mg/dL — ABNORMAL HIGH (ref 70–99)
Potassium: 3 mmol/L — ABNORMAL LOW (ref 3.5–5.1)
Sodium: 137 mmol/L (ref 135–145)
Total Bilirubin: 0.6 mg/dL (ref 0.3–1.2)
Total Protein: 7.2 g/dL (ref 6.5–8.1)

## 2022-10-28 LAB — ETHANOL: Alcohol, Ethyl (B): <10 mg/dL (ref <10)

## 2022-10-28 LAB — CBC WITH DIFFERENTIAL/PLATELET
Abs Immature Granulocytes: 0.03 10*3/uL (ref 0.00–0.07)
Basophils Absolute: 0 10*3/uL (ref 0.0–0.1)
Basophils Relative: 0 %
Eosinophils Absolute: 0.1 10*3/uL (ref 0.0–0.5)
Eosinophils Relative: 1 %
HCT: 35.8 % — ABNORMAL LOW (ref 39.0–52.0)
Hemoglobin: 12 g/dL — ABNORMAL LOW (ref 13.0–17.0)
Immature Granulocytes: 0 %
Lymphocytes Relative: 19 %
Lymphs Abs: 1.6 10*3/uL (ref 0.7–4.0)
MCH: 30.5 pg (ref 26.0–34.0)
MCHC: 33.5 g/dL (ref 30.0–36.0)
MCV: 91.1 fL (ref 80.0–100.0)
Monocytes Absolute: 0.6 10*3/uL (ref 0.1–1.0)
Monocytes Relative: 7 %
Neutro Abs: 6 10*3/uL (ref 1.7–7.7)
Neutrophils Relative %: 73 %
Platelets: 273 10*3/uL (ref 150–400)
RBC: 3.93 MIL/uL — ABNORMAL LOW (ref 4.22–5.81)
RDW: 13.2 % (ref 11.5–15.5)
WBC: 8.3 10*3/uL (ref 4.0–10.5)
nRBC: 0 % (ref 0.0–0.2)

## 2022-10-28 LAB — RAPID URINE DRUG SCREEN, HOSP PERFORMED
Amphetamines: NOT DETECTED
Barbiturates: NOT DETECTED
Benzodiazepines: NOT DETECTED
Cocaine: NOT DETECTED
Opiates: NOT DETECTED
Tetrahydrocannabinol: POSITIVE — AB

## 2022-10-28 LAB — SALICYLATE LEVEL: Salicylate Lvl: <7 mg/dL — ABNORMAL LOW (ref 7.0–30.0)

## 2022-10-28 LAB — ACETAMINOPHEN LEVEL: Acetaminophen (Tylenol), Serum: <10 ug/mL — ABNORMAL LOW (ref 10–30)

## 2022-10-28 MED ORDER — POTASSIUM CHLORIDE 20 MEQ PO PACK
40.0000 meq | PACK | Freq: Once | ORAL | Status: AC
  Administered 2022-10-28: 40 meq via ORAL
  Filled 2022-10-28: qty 2

## 2022-10-28 MED ORDER — LORAZEPAM 2 MG/ML IJ SOLN: 0.0000 mg | Freq: Four times a day (QID) | INTRAMUSCULAR | Status: DC

## 2022-10-28 MED ORDER — LORAZEPAM 1 MG PO TABS: 0.0000 mg | ORAL_TABLET | Freq: Two times a day (BID) | ORAL | Status: DC

## 2022-10-28 MED ORDER — ACETAMINOPHEN 325 MG PO TABS: 650.0000 mg | ORAL_TABLET | Freq: Four times a day (QID) | ORAL | Status: DC | PRN

## 2022-10-28 MED ORDER — THIAMINE MONONITRATE 100 MG PO TABS
100.0000 mg | ORAL_TABLET | Freq: Every day | ORAL | Status: DC
  Administered 2022-10-28: 100 mg via ORAL
  Filled 2022-10-28: qty 1

## 2022-10-28 MED ORDER — THIAMINE HCL 100 MG/ML IJ SOLN: 100.0000 mg | Freq: Every day | INTRAMUSCULAR | Status: DC

## 2022-10-28 MED ORDER — LORAZEPAM 2 MG/ML IJ SOLN: 0.0000 mg | Freq: Two times a day (BID) | INTRAMUSCULAR | Status: DC

## 2022-10-28 MED ORDER — LORAZEPAM 1 MG PO TABS
0.0000 mg | ORAL_TABLET | Freq: Four times a day (QID) | ORAL | Status: DC
  Filled 2022-10-28: qty 2

## 2022-10-28 NOTE — ED Notes (Signed)
Also sent red, gold, and pink top down to the lab

## 2022-10-28 NOTE — BH Assessment (Signed)
Comprehensive Clinical Assessment (CCA) Note  10/29/2022 [name] [id number]  Disposition: Clinical report given to Roselyn Bering, NP who recommends inpatient admission. RN Belenda Cruise and Fulton Reek notified of recommendation.  The patient demonstrates the following risk factors for suicide: Chronic risk factors for suicide include: psychiatric disorder of bipolar 1 and substance use disorder. Acute risk factors for suicide include: N/A. Protective factors for this patient include: positive social support and responsibility to others (children, family). Considering these factors, the overall suicide risk at this point appears to be moderate. Patient is not appropriate for outpatient follow up.  [name] is a 62 year old widowed male who presents voluntarily to [facility] ED, due to being manic, per EDP note. Patient has a history of bipolar I. When clinician asks what brings him in, patient says he was cutting grass and drinking beer when he became overheated. When asked what makes him believe he is manic, patient says he has been trying to buy cars and motorcycles. Patient says he believes he has been manic for six months. Patient currently denies being suicidal and states he said he was for attention. Patient denies recent depressive symptoms. Patient reports he cannot recall the last time he went to sleep and that he has a good appetite. Patient reports auditory hallucinations of "talking" and visual hallucinations of faces. Patient denies HI. Patient states he drinks beer daily and says he drank a 12-pack of beer today. Patient initially denied additional substance use, however, when informed his UDS is positive for [facility], patient says he smoked "a little" today.  Patient reports no stressors currently. Patient states he lives with a roommate and works full-time with [employer]. Patient identifies his friends and family as supports. Patient denies a history of abuse or trauma. Patient  denies current legal problems.   Patient states he receives counseling with [facility] [place] and says he had a counseling session today. Patient initially reports he is prescribed Seroquel, trazadone, and lithium. Patient says he takes the medication daily. When informed he told the EDP provider that he has not taken the medication in months, patient confirms he does not know the last time he took medication.   Patient is dressed in scrubs, alert and oriented x4 with normal speech. Patient has a flat affect and appears somewhat anxious. Patient is guarded and initially denies any mental health concerns. Patient does not acknowledge information reported in EDP note, until it is addressed by clinician. Patient provides answers to some question and then retracts his initial statement with another answer. Patient's speech is coherent and there is no indication he is responding to internal stimuli. Patient is cooperative throughout the assessment.    Chief Complaint:  Chief Complaint  Patient presents with   Psychiatric Evaluation   Visit Diagnosis: Suicidal Bipolar 1    CCA Screening, Triage and Referral (STR)  Patient Reported Information How did you hear about [place]? Family/Friend  What Is the Reason for Your Visit/Call Today? [name] is a 62 year old widowed male who presents voluntarily to [facility] ED, due to being manic. Patient IVC'd while in the ED. Patient also reports he has been off his medications for months. Patient endorses SI with a plan. Patient reports AH and VH of faces. Patient states he drinks a 12-pack of beer daily. Patient also smoked THC today. Patient denies access to guns or weapons.  How Long Has This Been Causing You Problems? 1-6 months  What Do You Feel Would Help  You the Most Today? Treatment for Depression or other mood problem   Have You Recently Had Any Thoughts About Hurting Yourself? Yes  Are You Planning to Commit Suicide/Harm  Yourself At This time? No   Flowsheet Row ED from 10/28/2022 in [facility] Emergency Department at [facility] ED from 10/04/2022 in [facility] Emergency Department at [facility] ED from 08/24/2022 in [facility] Emergency Department at [facility]  C-SSRS RISK CATEGORY Moderate Risk No Risk No Risk       Have you Recently Had Thoughts About Hurting Someone [name]? No  Are You Planning to Harm Someone at This Time? No  Explanation: N/A   Have You Used Any Alcohol or Drugs in the Past 24 Hours? Yes  What Did You Use and How Much? 12-pack of beer and "a little" THC   Do You Currently Have a Therapist/Psychiatrist? Yes  Name of Therapist/Psychiatrist: Name of Therapist/Psychiatrist: Patient reports [facility] [place]   Have You Been Recently Discharged From Any Public relations account executive or Programs? No  Explanation of Discharge From Practice/Program: N/A     CCA Screening Triage Referral Assessment Type of Contact: Tele-Assessment  Telemedicine Service Delivery:   Is this Initial or Reassessment? Is this Initial or Reassessment?: Initial Assessment  Date Telepsych consult ordered in [facility]:  Date Telepsych consult ordered in [facility]: 10/28/22  Time Telepsych consult ordered in [facility]:  Time Telepsych consult ordered in [facility]: 2141  Location of Assessment: [facility] ED  Provider Location: [facility] Assessment Services   Collateral Involvement: None   Does Patient Have a Automotive engineer Guardian? No  Legal Guardian Contact Information: N/A  Copy of Legal Guardianship Form: -- (N/A)  Legal Guardian Notified of Arrival: -- (N/A)  Legal Guardian Notified of Pending Discharge: -- (N/A)  If Minor and Not Living with Parent(s), Who has Custody? N/A  Is CPS involved or ever been involved? Never  Is APS involved or ever been involved? Never   Patient Determined To Be At Risk for Harm To Self or Others Based on Review of Patient Reported Information or  Presenting Complaint? Yes, for Self-Harm (Denies HI.)  Method: Plan without intent (N/A)  Availability of Means: No access or NA (N/A)  Intent: Vague intent or NA (N/A)  Notification Required: No need or identified person (N/A)  Additional Information for Danger to Others Potential: -- (N/A)  Additional Comments for Danger to Others Potential: N/A  Are There Guns or Other Weapons in Your Home? No  Types of Guns/Weapons: N/A  Are These Weapons Safely Secured?                            -- (N/A)  Who Could Verify You Are Able To Have These Secured: N/A  Do You Have any Outstanding Charges, Pending Court Dates, Parole/Probation? Patient denies.  Contacted To Inform of Risk of Harm To Self or Others: -- (N/A)    Does Patient Present under Involuntary Commitment? Yes    [place] of Residence: [place]   Patient Currently Receiving the Following Services: Not Receiving Services   Determination of Need: Emergent (2 hours)   Options For Referral: Inpatient Hospitalization     CCA Biopsychosocial Patient Reported Schizophrenia/Schizoaffective Diagnosis in Past: No   Strengths: Patient has supportive family and friends.   Mental Health Symptoms Depression:   None   Duration of Depressive symptoms:    Mania:   Euphoria  Anxiety:    None   Psychosis:   Hallucinations   Duration of Psychotic symptoms:  Duration of Psychotic Symptoms: Less than six months   Trauma:   None   Obsessions:   None   Compulsions:   None   Inattention:   None   Hyperactivity/Impulsivity:   None   Oppositional/Defiant Behaviors:   None   Emotional Irregularity:   None   Other Mood/Personality Symptoms:   N/A    Mental Status Exam Appearance and self-care  Stature:   Tall   Weight:   Average weight   Clothing:   -- (Scrubs.)   Grooming:   Normal   Cosmetic use:   None   Posture/gait:   Normal   Motor activity:   Not Remarkable   Sensorium   Attention:   Distractible   Concentration:   Normal   Orientation:   X5   Recall/memory:   Normal   Affect and Mood  Affect:   Anxious; Appropriate   Mood:   Anxious   Relating  Eye contact:   Normal   Facial expression:   Tense   Attitude toward examiner:   Cooperative   Thought and Language  Speech flow:  Normal   Thought content:   Appropriate to Mood and Circumstances   Preoccupation:   None   Hallucinations:   Auditory; Visual   Organization:   Patent examiner of Knowledge:   Average   Intelligence:   Average   Abstraction:   Normal   Judgement:   Fair   Dance movement psychotherapist:   Adequate   Insight:   Poor   Decision Making:   Impulsive   Social Functioning  Social Maturity:   Impulsive   Social Judgement:   Normal   Stress  Stressors:   Work   Coping Ability:   Human resources officer Deficits:   None   Supports:   Family; Friends/Service system     Religion: Religion/Spirituality Are You A Religious Person?: Yes What is Your Religious Affiliation?: Christian How Might This Affect Treatment?: N/A  Leisure/Recreation: Leisure / Recreation Do You Have Hobbies?: Yes Leisure and Hobbies: Guitar and fishing.  Exercise/Diet: Exercise/Diet Do You Exercise?: No Have You Gained or Lost A Significant Amount of Weight in the Past Six Months?: No Do You Follow a Special Diet?: No Do You Have Any Trouble Sleeping?: Yes Explanation of Sleeping Difficulties: Pt reports he can not recall the last time he slept.   CCA Employment/Education Employment/Work Situation: Employment / Work Situation Employment Situation: Employed Work Stressors: Pt does not specify. Patient's Job has Been Impacted by Current Illness: No Has Patient ever Been in the [employer]?: No  Education: Education Is Patient Currently Attending School?: No Last Grade Completed: 12 Did You Attend College?: Yes What Type of College  Degree Do you Have?: Two years. Did You Have An Individualized Education Program (IIEP): No Did You Have Any Difficulty At School?: No Patient's Education Has Been Impacted by Current Illness: No   CCA Family/Childhood History Family and Relationship History: Family history Marital status: Widowed Widowed, when?: 2004 Does patient have children?: Yes How many children?: 2 How is patient's relationship with their children?: Pt reports a good relationship/  Childhood History:  Childhood History By whom was/is the patient raised?: Both parents Did patient suffer any verbal/emotional/physical/sexual abuse as a child?: No Did patient suffer from severe childhood neglect?: No Has patient ever been sexually abused/assaulted/raped as an adolescent or  adult?: No Was the patient ever a victim of a crime or a disaster?: No Witnessed domestic violence?: No Has patient been affected by domestic violence as an adult?: No       CCA Substance Use Alcohol/Drug Use: Alcohol / Drug Use Pain Medications: N/A Prescriptions: N/A Over the Counter: N/A History of alcohol / drug use?: Yes Longest period of sobriety (when/how long): 6 months. Negative Consequences of Use:  (N/A) Withdrawal Symptoms: None Substance #1 Name of Substance 1: Alcohol 1 - Age of First Use: 32 1 - Amount (size/oz): 12-pack 1 - Frequency: Daily 1 - Duration: Ongoing 1 - Last Use / Amount: Today 1 - Method of Aquiring: Purchase 1- Route of Use: Orally                       ASAM's:  Six Dimensions of Multidimensional Assessment  Dimension 1:  Acute Intoxication and/or Withdrawal Potential:   Dimension 1:  Description of individual's past and current experiences of substance use and withdrawal: Patient denies withdrawal symptoms.  Dimension 2:  Biomedical Conditions and Complications:   Dimension 2:  Description of patient's biomedical conditions and  complications: Patient reprots no health concerns.   Dimension 3:  Emotional, Behavioral, or Cognitive Conditions and Complications:  Dimension 3:  Description of emotional, behavioral, or cognitive conditions and complications: Patient reports AH and VH.  Dimension 4:  Readiness to Change:  Dimension 4:  Description of Readiness to Change criteria: Pt reports no desire to seek subtance treatment.  Dimension 5:  Relapse, Continued use, or Continued Problem Potential:  Dimension 5:  Relapse, continued use, or continued problem potential critiera description: Moderate  Dimension 6:  Recovery/Living Environment:  Dimension 6:  Recovery/Iiving environment criteria description: Patient reports a supportive enviornment.  ASAM Severity Score: ASAM's Severity Rating Score: 6  ASAM Recommended Level of Treatment: ASAM Recommended Level of Treatment: Level I Outpatient Treatment   Substance use Disorder (SUD) Substance Use Disorder (SUD)  Checklist Symptoms of Substance Use: Persistent desire or unsuccessful efforts to cut down or control use, Presence of craving or strong urge to use  Recommendations for Services/Supports/Treatments: Recommendations for Services/Supports/Treatments Recommendations For Services/Supports/Treatments: Inpatient Hospitalization  Discharge Disposition:    DSM5 Diagnoses: Patient Active Problem List   Diagnosis Date Noted   Anxiety and depression    Bipolar affective disorder in remission (HCC) 01/16/2016   Cocaine use disorder, mild, abuse (HCC) 12/31/2015     Referrals to Alternative Service(s): Referred to Alternative Service(s):   Place:   Date:   Time:    Referred to Alternative Service(s):   Place:   Date:   Time:    Referred to Alternative Service(s):   Place:   Date:   Time:    Referred to Alternative Service(s):   Place:   Date:   Time:     Cleda Clarks, LCSW

## 2022-10-28 NOTE — ED Provider Notes (Signed)
[place] EMERGENCY DEPARTMENT AT [facility] Provider Note   CSN: [id number] Arrival date & time: 10/28/22  2103     History  Chief Complaint  Patient presents with   Psychiatric Evaluation    [name] is a 62 y.o. male.  HPI 62 year old male history of bipolar 1 presenting for concern for mania.  Patient states he has not taken his medications in months, used to be on lithium and trazodone.  He states recently he has had insomnia, increased risky behavior, and suicidal ideation.  When asked about the plan he states he has a plan but then does not state what it is.  Denies HI.  He is here with his friend.  He states is worse today and his mom wanted him to be evaluated.  He is concerned that he is manic.  He denies any drug use but does drink about a 12 pack of beer daily.  He drank today and denies any history of withdrawal.     Home Medications Prior to Admission medications   Medication Sig Start Date End Date Taking? Authorizing Provider  carbamide peroxide (DEBROX) 6.5 % OTIC solution Place 5 drops into both ears 2 (two) times daily as needed (ear wax buildup). 07/05/19   [name], MD  ibuprofen (ADVIL) 600 MG tablet Take 1 tablet (600 mg total) by mouth every 6 (six) hours as needed. 12/31/19   [name], PA-C  lidocaine (XYLOCAINE) 2 % solution Use as directed 15 mLs in the mouth or throat as needed for mouth pain. 12/22/21   Henderly, Britni A, PA-C  lithium 300 MG tablet Take 1 tablet (300 mg total) by mouth 2 (two) times daily for 28 doses. 09/17/17 10/01/17  [name], MD  methocarbamol (ROBAXIN) 500 MG tablet Take 1 tablet (500 mg total) by mouth 2 (two) times daily. 12/31/19   [name], PA-C  predniSONE (DELTASONE) 50 MG tablet Take 1 tablet (50 mg total) by mouth daily with breakfast. 12/31/19   [name], PA-C  QUEtiapine (SEROQUEL) 100 MG tablet Take 1 tablet (100 mg total) by mouth at bedtime. 09/17/17   [name], MD       Allergies    Patient has no known allergies.    Review of Systems   Review of Systems Review of systems completed and notable as per HPI.  ROS otherwise negative.  Physical Exam Updated Vital Signs BP (!) 151/85 (BP Location: Left Arm)   Pulse (!) 46   Temp (!) 97.4 F (36.3 C) (Oral)   Resp 16   Ht 6' (1.829 m)   Wt 77.1 kg   SpO2 100%   BMI 23.06 kg/m  Physical Exam Vitals and nursing note reviewed.  Constitutional:      General: He is not in acute distress.    Appearance: He is well-developed.  HENT:     Head: Normocephalic and atraumatic.  Eyes:     Extraocular Movements: Extraocular movements intact.     Conjunctiva/sclera: Conjunctivae normal.     Pupils: Pupils are equal, round, and reactive to light.  Cardiovascular:     Rate and Rhythm: Normal rate and regular rhythm.     Heart sounds: No murmur heard. Pulmonary:     Effort: Pulmonary effort is normal. No respiratory distress.     Breath sounds: Normal breath sounds.  Abdominal:     Palpations: Abdomen is soft.  Musculoskeletal:        General: No swelling.  Cervical back: Neck supple.  Skin:    General: Skin is warm and dry.     Capillary Refill: Capillary refill takes less than 2 seconds.  Neurological:     General: No focal deficit present.     Mental Status: He is alert and oriented to person, place, and time.  Psychiatric:        Attention and Perception: He does not perceive auditory or visual hallucinations.        Mood and Affect: Mood is anxious.        Speech: Speech is tangential.        Behavior: Behavior is cooperative.        Thought Content: Thought content is not paranoid or delusional. Thought content includes suicidal ideation. Thought content does not include homicidal ideation. Thought content does not include homicidal plan.        Judgment: Judgment is impulsive.     ED Results / Procedures / Treatments   Labs (all labs ordered are listed, but only abnormal results are  displayed) Labs Reviewed  COMPREHENSIVE METABOLIC PANEL - Abnormal; Notable for the following components:      Result Value   Potassium 3.0 (*)    Glucose, Bld 112 (*)    Creatinine, Ser 1.31 (*)    All other components within normal limits  RAPID URINE DRUG SCREEN, HOSP PERFORMED - Abnormal; Notable for the following components:   Tetrahydrocannabinol POSITIVE (*)    All other components within normal limits  CBC WITH DIFFERENTIAL/PLATELET - Abnormal; Notable for the following components:   RBC 3.93 (*)    Hemoglobin 12.0 (*)    HCT 35.8 (*)    All other components within normal limits  SALICYLATE LEVEL - Abnormal; Notable for the following components:   Salicylate Lvl <7.0 (*)    All other components within normal limits  ACETAMINOPHEN LEVEL - Abnormal; Notable for the following components:   Acetaminophen (Tylenol), Serum <10 (*)    All other components within normal limits  ETHANOL    EKG EKG Interpretation Date/Time:  Saturday October 28 2022 23:17:43 EDT Ventricular Rate:  55 PR Interval:  184 QRS Duration:  92 QT Interval:  456 QTC Calculation: 436 R Axis:   -4  Text Interpretation: Sinus bradycardia No significant change was found Confirmed by Fulton Reek [id number]) on 10/28/2022 11:21:44 PM  Radiology No results found.  Procedures Procedures    Medications Ordered in ED Medications  LORazepam (ATIVAN) injection 0-4 mg (2 mg Intravenous Not Given 10/28/22 2240)    Or  LORazepam (ATIVAN) tablet 0-4 mg ( Oral See Alternative 10/28/22 2240)  LORazepam (ATIVAN) injection 0-4 mg (has no administration in time range)    Or  LORazepam (ATIVAN) tablet 0-4 mg (has no administration in time range)  thiamine (VITAMIN B1) tablet 100 mg (100 mg Oral Given 10/28/22 2159)    Or  thiamine (VITAMIN B1) injection 100 mg ( Intravenous See Alternative 10/28/22 2159)  potassium chloride (KLOR-CON) packet 40 mEq (40 mEq Oral Given 10/28/22 2324)    ED Course/ Medical Decision  Making/ A&P                             Medical Decision Making Amount and/or Complexity of Data Reviewed Labs: ordered.  Risk OTC drugs. Prescription drug management.   Medical Decision Making:   [name] is a 62 y.o. male who presented to the ED today with mania and  suicidality.  Vital signs reviewed.  On exam he is tangential and endorses recent increased risky behavior as well as insomnia concerning for mania secondary to bipolar 1.  Denies any drug use although he does drink alcohol regularly.  Is not acutely withdrawing, but CIWA was ordered given history of heavy alcohol use.  No physical concerns.  Also endorses SI and initially states he has a plan although will not state what it is.  I am concerned that he is potentially an acute threat to the safety of himself.  I think he needs evaluation by psychiatry.  IVC paperwork filled out.  He was updated on plan.  Will obtain screening labs and then plan for psychiatric evaluation.  Reviewed and confirmed nursing documentation for past medical history, family history, social history.  Initial Study Results:   Laboratory  All laboratory results reviewed.  Labs notable for potassium 3.0.  Creatinine is 1.3, most recent was 1 several years ago but he also has another at 1.3 as well.  CBC with mild anemia.  EKG EKG was reviewed independently.  He has possible partial right bundle branch block which has been seen prior, as well as some nonspecific ST changes in the septal leads.  Reassessment and Plan:   Reassessment patient remained stable.  His EKG has some nonspecific ST abnormalities, however no chest pain, shortness of breath or concerning findings for ACS at this time.  Screening lab work is pending.  Repeat EKG improved and similar to most recent EKG prior to presentation today, low concern for ACS or other acute arrhythmia.  Potassium repletion ordered.  CIWA ordered as well given history of alcohol use.  Pending psychiatric  evaluation.  Patient's presentation is most consistent with acute presentation with potential threat to life or bodily function.          Final Clinical Impression(s) / ED Diagnoses Final diagnoses:  Suicidal ideation    Rx / DC Orders ED Discharge Orders     None         Laurence Spates, MD 10/28/22 2328
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
## 2022-10-28 NOTE — ED Notes (Signed)
Pt has been dressed out and pt belongings have been given to friend to take home. Security has wanded patient

## 2022-10-29 ENCOUNTER — Inpatient Hospital Stay (HOSPITAL_COMMUNITY)
Admission: RE | Admit: 2022-10-29 | Discharge: 2022-11-02 | DRG: 641 | Disposition: A | Discharge status: Home / Self Care | Payer: 59 | Attending: Psychiatry | Admitting: Psychiatry

## 2022-10-29 ENCOUNTER — Encounter (HOSPITAL_COMMUNITY): Payer: Self-pay | Admitting: Psychiatry

## 2022-10-29 DIAGNOSIS — Z5982 Transportation insecurity: Secondary | ICD-10-CM | POA: Diagnosis not present

## 2022-10-29 DIAGNOSIS — F1090 Alcohol use, unspecified, uncomplicated: Secondary | ICD-10-CM

## 2022-10-29 DIAGNOSIS — E876 Hypokalemia: Secondary | ICD-10-CM | POA: Diagnosis present

## 2022-10-29 DIAGNOSIS — F10139 Alcohol abuse with withdrawal, unspecified: Secondary | ICD-10-CM | POA: Diagnosis present

## 2022-10-29 DIAGNOSIS — F319 Bipolar disorder, unspecified: Secondary | ICD-10-CM | POA: Diagnosis present

## 2022-10-29 DIAGNOSIS — Z79899 Other long term (current) drug therapy: Secondary | ICD-10-CM | POA: Diagnosis not present

## 2022-10-29 DIAGNOSIS — I10 Essential (primary) hypertension: Secondary | ICD-10-CM | POA: Diagnosis present

## 2022-10-29 DIAGNOSIS — E512 Wernicke's encephalopathy: Secondary | ICD-10-CM

## 2022-10-29 DIAGNOSIS — Z91148 Patient's other noncompliance with medication regimen for other reason: Secondary | ICD-10-CM | POA: Diagnosis not present

## 2022-10-29 HISTORY — DX: Wernicke's encephalopathy: E51.2

## 2022-10-29 HISTORY — DX: Alcohol use, unspecified, uncomplicated: F10.90

## 2022-10-29 LAB — LIPID PANEL
Cholesterol: 148 mg/dL (ref 0–200)
HDL: 43 mg/dL (ref >40)
LDL Cholesterol: 89 mg/dL (ref 0–99)
Total CHOL/HDL Ratio: 3.4 RATIO
Triglycerides: 80 mg/dL (ref <150)
VLDL: 16 mg/dL (ref 0–40)

## 2022-10-29 LAB — TSH: TSH: 0.374 u[IU]/mL (ref 0.350–4.500)

## 2022-10-29 MED ORDER — LORAZEPAM 1 MG PO TABS: 2.0000 mg | ORAL_TABLET | Freq: Once | ORAL | Status: DC

## 2022-10-29 MED ORDER — LORAZEPAM 1 MG PO TABS: 2.0000 mg | ORAL_TABLET | Freq: Three times a day (TID) | ORAL | Status: DC | PRN

## 2022-10-29 MED ORDER — LOPERAMIDE HCL 2 MG PO CAPS: 2.0000 mg | ORAL_CAPSULE | ORAL | Status: AC | PRN

## 2022-10-29 MED ORDER — DIPHENHYDRAMINE HCL 25 MG PO CAPS: 50.0000 mg | ORAL_CAPSULE | Freq: Three times a day (TID) | ORAL | Status: DC | PRN

## 2022-10-29 MED ORDER — ADULT MULTIVITAMIN W/MINERALS CH
1.0000 | ORAL_TABLET | Freq: Every day | ORAL | Status: DC
  Administered 2022-10-29 – 2022-11-02 (×5): 1 via ORAL
  Filled 2022-10-29 (×8): qty 1

## 2022-10-29 MED ORDER — TRAZODONE HCL 50 MG PO TABS
50.0000 mg | ORAL_TABLET | Freq: Every evening | ORAL | Status: DC | PRN
  Administered 2022-10-29 – 2022-11-01 (×3): 50 mg via ORAL
  Filled 2022-10-29 (×4): qty 1

## 2022-10-29 MED ORDER — THIAMINE HCL 100 MG/ML IJ SOLN
100.0000 mg | Freq: Once | INTRAMUSCULAR | Status: AC
  Administered 2022-10-29: 100 mg via INTRAMUSCULAR

## 2022-10-29 MED ORDER — LORAZEPAM 1 MG PO TABS
1.0000 mg | ORAL_TABLET | Freq: Three times a day (TID) | ORAL | Status: AC
  Administered 2022-10-30 (×3): 1 mg via ORAL
  Filled 2022-10-29 (×3): qty 1

## 2022-10-29 MED ORDER — DIPHENHYDRAMINE HCL 50 MG/ML IJ SOLN: 50.0000 mg | Freq: Three times a day (TID) | INTRAMUSCULAR | Status: DC | PRN

## 2022-10-29 MED ORDER — HALOPERIDOL 5 MG PO TABS: 5.0000 mg | ORAL_TABLET | Freq: Three times a day (TID) | ORAL | Status: DC | PRN

## 2022-10-29 MED ORDER — LORAZEPAM 1 MG PO TABS
1.0000 mg | ORAL_TABLET | Freq: Every day | ORAL | Status: AC
  Administered 2022-11-01: 1 mg via ORAL
  Filled 2022-10-29: qty 1

## 2022-10-29 MED ORDER — ALUM & MAG HYDROXIDE-SIMETH 200-200-20 MG/5ML PO SUSP: 30.0000 mL | ORAL | Status: DC | PRN

## 2022-10-29 MED ORDER — HALOPERIDOL LACTATE 5 MG/ML IJ SOLN: 5.0000 mg | Freq: Three times a day (TID) | INTRAMUSCULAR | Status: DC | PRN

## 2022-10-29 MED ORDER — HYDROXYZINE HCL 25 MG PO TABS
25.0000 mg | ORAL_TABLET | Freq: Four times a day (QID) | ORAL | Status: AC | PRN
  Administered 2022-10-31: 25 mg via ORAL

## 2022-10-29 MED ORDER — MAGNESIUM HYDROXIDE 400 MG/5ML PO SUSP: 30.0000 mL | Freq: Every day | ORAL | Status: DC | PRN

## 2022-10-29 MED ORDER — THIAMINE HCL 100 MG/ML IJ SOLN: 100.0000 mg | Freq: Once | INTRAMUSCULAR | Status: AC

## 2022-10-29 MED ORDER — HYDROXYZINE HCL 25 MG PO TABS
25.0000 mg | ORAL_TABLET | Freq: Three times a day (TID) | ORAL | Status: DC | PRN
  Administered 2022-10-29 – 2022-11-01 (×4): 25 mg via ORAL
  Filled 2022-10-29 (×6): qty 1

## 2022-10-29 MED ORDER — LORAZEPAM 2 MG/ML IJ SOLN
INTRAMUSCULAR | Status: AC
  Administered 2022-10-29: 2 mg via INTRAMUSCULAR
  Filled 2022-10-29: qty 1

## 2022-10-29 MED ORDER — LORAZEPAM 2 MG/ML IJ SOLN: 2.0000 mg | Freq: Three times a day (TID) | INTRAMUSCULAR | Status: DC | PRN

## 2022-10-29 MED ORDER — LORAZEPAM 1 MG PO TABS
1.0000 mg | ORAL_TABLET | Freq: Two times a day (BID) | ORAL | Status: AC
  Administered 2022-10-31 (×2): 1 mg via ORAL
  Filled 2022-10-29 (×2): qty 1

## 2022-10-29 MED ORDER — LORAZEPAM 1 MG PO TABS
1.0000 mg | ORAL_TABLET | Freq: Four times a day (QID) | ORAL | Status: AC
  Administered 2022-10-29 (×2): 1 mg via ORAL
  Filled 2022-10-29 (×2): qty 1

## 2022-10-29 MED ORDER — POTASSIUM CHLORIDE CRYS ER 20 MEQ PO TBCR
40.0000 meq | EXTENDED_RELEASE_TABLET | Freq: Once | ORAL | Status: AC
  Administered 2022-10-29: 40 meq via ORAL
  Filled 2022-10-29 (×2): qty 2

## 2022-10-29 MED ORDER — ENSURE ENLIVE PO LIQD
237.0000 mL | Freq: Two times a day (BID) | ORAL | Status: DC
  Administered 2022-10-30 – 2022-11-02 (×6): 237 mL via ORAL
  Filled 2022-10-29 (×11): qty 237

## 2022-10-29 MED ORDER — ACETAMINOPHEN 325 MG PO TABS
650.0000 mg | ORAL_TABLET | Freq: Four times a day (QID) | ORAL | Status: DC | PRN
  Administered 2022-10-30 – 2022-11-02 (×3): 650 mg via ORAL
  Filled 2022-10-29 (×3): qty 2

## 2022-10-29 MED ORDER — POTASSIUM CHLORIDE CRYS ER 20 MEQ PO TBCR: 40.0000 meq | EXTENDED_RELEASE_TABLET | Freq: Two times a day (BID) | ORAL | Status: DC

## 2022-10-29 MED ORDER — ONDANSETRON 4 MG PO TBDP: 4.0000 mg | ORAL_TABLET | Freq: Four times a day (QID) | ORAL | Status: AC | PRN

## 2022-10-29 MED ORDER — LORAZEPAM 1 MG PO TABS: 1.0000 mg | ORAL_TABLET | Freq: Four times a day (QID) | ORAL | Status: AC | PRN

## 2022-10-29 MED ORDER — VITAMIN B-1 100 MG PO TABS
100.0000 mg | ORAL_TABLET | Freq: Every day | ORAL | Status: DC
  Administered 2022-10-30: 100 mg via ORAL
  Filled 2022-10-29 (×2): qty 1

## 2022-10-29 NOTE — Tx Team (Signed)
Initial Treatment Plan 10/29/2022 6:36 AM [name] ZOX:[id number]    PATIENT STRESSORS: Substance abuse     PATIENT STRENGTHS: Ability for insight  Average or above average intelligence  Supportive family/friends    PATIENT IDENTIFIED PROBLEMS: Substance abuse                     DISCHARGE CRITERIA:  Improved stabilization in mood, thinking, and/or behavior Need for constant or close observation no longer present  PRELIMINARY DISCHARGE PLAN: Return to previous living arrangement  PATIENT/FAMILY INVOLVEMENT: This treatment plan has been presented to and reviewed with the patient, [name],   The patient has been given the opportunity to ask questions and make suggestions.  Lahoma Crocker, RN 10/29/2022, 6:36 AM

## 2022-10-29 NOTE — Progress Notes (Signed)
   10/29/22 2145  Psych Admission Type (Psych Patients Only)  Admission Status Voluntary  Psychosocial Assessment  Patient Complaints Anxiety  Eye Contact Fair  Facial Expression Flat  Affect Appropriate to circumstance  Speech Logical/coherent  Interaction Assertive  Motor Activity Other (Comment)  Appearance/Hygiene Improved  Behavior Characteristics Cooperative;Appropriate to situation  Mood Pleasant  Thought Process  Coherency Unable to assess  Content UTA  Delusions None reported or observed  Perception UTA  Hallucination None reported or observed  Judgment Limited  Confusion WDL  Danger to Self  Current suicidal ideation? Denies  Agreement Not to Harm Self Yes  Description of Agreement Verbal  Danger to Others  Danger to Others None reported or observed
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
## 2022-10-29 NOTE — Plan of Care (Signed)
  Problem: Education: Goal: Knowledge of [facility] Education information/materials will improve Outcome: Progressing Goal: Emotional status will improve Outcome: Progressing Goal: Mental status will improve Outcome: Progressing Goal: Verbalization of understanding the information provided will improve Outcome: Progressing   Problem: Health Behavior/Discharge Planning: Goal: Identification of resources available to assist in meeting health care needs will improve Outcome: Progressing   Problem: Safety: Goal: Periods of time without injury will increase 10/29/2022 0704 by Lahoma Crocker, RN Outcome: Progressing 10/29/2022 0703 by Lahoma Crocker, RN Outcome: Progressing

## 2022-10-29 NOTE — Progress Notes (Signed)
   10/29/22 0400  Psych Admission Type (Psych Patients Only)  Admission Status Voluntary  Psychosocial Assessment  Patient Complaints None  Eye Contact Fair  Facial Expression Flat  Affect Appropriate to circumstance  Speech Logical/coherent  Interaction Assertive  Motor Activity Other (Comment) (WDL)  Appearance/Hygiene In scrubs  Behavior Characteristics Cooperative;Appropriate to situation  Mood Preoccupied  Thought Process  Coherency Unable to assess  Content UTA  Delusions None reported or observed  Perception UTA  Hallucination None reported or observed  Judgment Impaired  Confusion WDL  Danger to Self  Current suicidal ideation? Denies  Agreement Not to Harm Self Yes  Description of Agreement verbal  Danger to Others  Danger to Others None reported or observed

## 2022-10-29 NOTE — [facility] Group Notes (Signed)
Adult Psychoeducational Group Note  Date:  10/29/2022 Time:  8:48 PM  Group Topic/Focus:  Wrap-Up Group:   The focus of this group is to help patients review their daily goal of treatment and discuss progress on daily workbooks.  Participation Level:  Active  Participation Quality:  Appropriate  Affect:  Appropriate  Cognitive:  Appropriate  Insight: Appropriate  Engagement in Group:  Engaged  Modes of Intervention:  Discussion  Additional Comments:  the color describe his day purple his overall day 8. Goal for today make ti to all groups achieved goal  [name] 10/29/2022, 8:48 PM

## 2022-10-29 NOTE — [facility] Counselor (Signed)
Adult Comprehensive Assessment  Patient ID: [name], male   DOB: 1960/05/11, 62 y.o.   MRN: [id number]  Information Source: Information source: Patient  Current Stressors:  Patient states their primary concerns and needs for treatment are:: Per patien to get back on his medication Patient states their goals for this hospitilization and ongoing recovery are:: Per patient to listen to staff and what they have to offer Educational / Learning stressors: none reported Employment / Job issues: none reported Family Relationships: none reported Surveyor, quantity / Lack of resources (include bankruptcy): none reported Housing / Lack of housing: none reported Physical health (include injuries & life threatening diseases): none reported Social relationships: none reported Substance abuse: none reported Bereavement / Loss: none reported  Living/Environment/Situation:  Living Arrangements: Non-relatives/Friends Living conditions (as described by patient or guardian): "pretty good" Who else lives in the home?: [name] How long has patient lived in current situation?: 3 months What is atmosphere in current home: Comfortable  Family History:  Marital status: Widowed Widowed, when?: 2004 Are you sexually active?: No What is your sexual orientation?: heterosexual Does patient have children?: Yes How many children?: 2 How is patient's relationship with their children?: Pt reports a good relationship/  Childhood History:  By whom was/is the patient raised?: Both parents Additional childhood history information: "Very good" Description of patient's relationship with caregiver when they were a child: Per patient, he was raised well by both parents, alot of moving around as a child. Parents divorced when he was 17 Patient's description of current relationship with people who raised him/her: Father passed away about 7 years ago, mother lives locally relationship very close How were you  disciplined when you got in trouble as a child/adolescent?: Per patient, "I never got hit one time, just sent to my room" Does patient have siblings?: Yes Number of Siblings: 1 Description of patient's current relationship with siblings: brother - good relationship with him Did patient suffer from severe childhood neglect?: No Has patient ever been sexually abused/assaulted/raped as an adolescent or adult?: No Was the patient ever a victim of a crime or a disaster?: No Witnessed domestic violence?: No Has patient been affected by domestic violence as an adult?: No  Education:  Highest grade of school patient has completed: Financial planner, 3 years at [employer] business administration Currently a Consulting civil engineer?: No Learning disability?: No  Employment/Work Situation:   Employment Situation: Employed Where is Patient Currently Employed?: [employer] Long has Patient Been Employed?: 4 years Are You Satisfied With Your Job?: Yes Do You Work More Than One Job?: No Work Stressors: None reported Patient's Job has Been Impacted by Current Illness: Yes Describe how Patient's Job has Been Impacted: Per patient, he currently in  law suit after being bit by a pit bull in May, initally missed alot of work What is the Longest Time Patient has Held a Job?: 15 years 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?: No  Financial Resources:   Financial resources: Income from employment Does patient have a representative payee or guardian?: No  Alcohol/Substance Abuse:   What has been your use of drugs/alcohol within the last 12 months?: None reported Alcohol/Substance Abuse Treatment Hx: Denies past history Has alcohol/substance abuse ever caused legal problems?: No  Social Support System:   Conservation officer, nature Support System: Good Describe Community Support System: Receives [facility] NP Darcel Smalling, counselor Type  of faith/religion: Ephriam Knuckles How does patient's faith help to cope with current illness?: "I read scriptures, helps me get through my day"  Leisure/Recreation:   Do You Have Hobbies?: Yes Leisure and Hobbies: Gold, fishing, playing guitar  Strengths/Needs:   What is the patient's perception of their strengths?: Per patient, "at work I am a leader" "I train the employees" Patient states they can use these personal strengths during their treatment to contribute to their recovery: Per patient, relaxing, writing and reading will be used to contribute to his recovery Patient states these barriers may affect/interfere with their treatment: none reported Patient states these barriers may affect their return to the community: none reported Other important information patient would like considered in planning for their treatment: Patient plans to return home with his roommate and ongoing mental health resources through [facility] the [employer]  Discharge Plan:   Currently receiving community mental health services: Yes (From Whom) Patient states concerns and preferences for aftercare planning are: Patient receivies mental health services through the [facility] the [place] Patient states they will know when they are safe and ready for discharge when: Per patient, he will be ready to return home when he is stable on his medications Does patient have access to transportation?: Yes Does patient have financial barriers related to discharge medications?: No Patient description of barriers related to discharge medications: none reported Will patient be returning to same living situation after discharge?: Yes  Summary/Recommendations:   Summary and Recommendations (to be completed by the evaluator): [name] is a 62 year old male who states that he was admitted due to being manic. Per patient, his mother became  concerned when he took too many walks outside in the heat. Patient admits to not taking some of his medications and would like to get stable on his medications before discharge home. 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 He will return home with is roommate.Patient would benefit from group therapy, medication management, psychoeducation, crisis stabilization, peer support and discharge planning.  At discharge it is recommended that the patient adhere to the established aftercare plan.  Tonnie Stillman, [place]. 10/29/2022

## 2022-10-29 NOTE — Progress Notes (Signed)
Patient denies SI, HI and AVH and any depression or anxiety. Patient provides short answers to questions and it is difficult to assess accuracy of his responses. Patient asks about books on the book cart and states he plans to start reading while here. Patient does not present with withdrawal symptoms with the exception of confusion and elevated BP at 158/77. Patient was administered IM lorazepam 2mg  and has been asleep for large part of shift. Patient remains safe on the unit Q 15 min safety checks ongoing.   10/29/22 0800  Psych Admission Type (Psych Patients Only)  Admission Status Voluntary  Psychosocial Assessment  Patient Complaints None  Eye Contact Fair  Facial Expression Flat  Affect Appropriate to circumstance  Speech Logical/coherent  Interaction Assertive  Motor Activity Other (Comment)  Appearance/Hygiene Unremarkable  Behavior Characteristics Cooperative;Appropriate to situation  Mood Pleasant  Thought Process  Coherency Unable to assess  Content UTA  Delusions None reported or observed  Perception UTA  Hallucination None reported or observed  Judgment Limited  Confusion Mild  Danger to Self  Current suicidal ideation? Denies  Agreement Not to Harm Self Yes  Description of Agreement verbal  Danger to Others  Danger to Others None reported or observed

## 2022-10-29 NOTE — H&P (Signed)
Psychiatric Admission Assessment Adult  Patient Identification: [name] MRN:  [id number] Date of Evaluation:  10/29/2022 Chief Complaint:  Bipolar 1 disorder (HCC) [F31.9] Principal Diagnosis: <principal problem not specified> Diagnosis:  Active Problems:   Bipolar 1 disorder (HCC)  History of Present Illness:  [name] is a 62 year old male with a past psychiatric history of bipolar 1 disorder, alcohol use disorder; numerous hospitalizations in the past (unclear reasons); no self-harm or suicide attempts.  He presents from the ED to the behavioral [facility] for concern for mania.  Due to confusion and memory issues, the patient is unable to give a reliable or accurate history.  When asked why the patient is here, he says "playing guitar," and does not describe anything described in the emergency department notes.  He says he has not been adhered to his medications due to transportation issues.  He reports no concerns for why he is here, despite being prompted with information.  He is able to provide past psychiatric history and substance use history but no history of presenting illness.  Collateral (mother, [phone]): She reports that the patient has been doing with the past 5 years without any manic episodes or other mental concerns.  For a long time he had been lithium but was recently switched to Abilify by his outpatient psychiatrist and was stable on this medication.  Apparently [name] told her that he had not been taking his medications recently and for no clear reason.  She reports that he uses alcohol and "smells like a brewery" but uses no other substances to her knowledge.  She accompanied him to see his outpatient psychiatrist this past Friday.  She confirms that he has been having memory loss recently and furthermore that he believes he had received a large sum of money from a lawsuit he is currently involved in.  She did not notice any of the mouth movements that were  noted on exam today.  She reports that he has never had an episode the past with this sort of memory loss.   Associated Signs/Symptoms: Depression Symptoms:  anhedonia, (Hypo) Manic Symptoms:  Distractibility, Anxiety Symptoms:  Excessive Worry, Psychotic Symptoms:   Denies AVH, paranoia PTSD Symptoms: Negative for past trauma. Total Time spent with patient: 45 minutes  Past Psychiatric History: bipolar 1 disorder, alcohol use disorder; numerous hospitalizations in the past (unclear reasons); no self-harm or suicide attempts  Is the patient at risk to self? Yes.    Has the patient been a risk to self in the past 6 months? No.  Has the patient been a risk to self within the distant past? No.  Is the patient a risk to others? No.  Has the patient been a risk to others in the past 6 months? No.  Has the patient been a risk to others within the distant past? No.   [place] Scale:  Flowsheet Row Ad[facility] (Current) [facility] from 10/28/2022 in [facility] ED from 10/04/2022 in [facility] Emergency Department at [facility]  C-SSRS RISK CATEGORY No Risk Moderate Risk No Risk        Prior Inpatient Therapy: No.  Prior Outpatient Therapy: No.   Alcohol Screening: 1. How often do you have a drink containing alcohol?: 4 or more times a week 2. How many drinks containing alcohol do you have on a typical day when you are drinking?: 3 or 4 3. How often  do you have six or more drinks on one occasion?: Monthly AUDIT-C Score: 7 4. How often during the last year have you found that you were not able to stop drinking once you had started?: Less than monthly 5. How often during the last year have you failed to do what was normally expected from you because of drinking?: Monthly 6. How often during the last year have you needed a first drink in the morning to get yourself going after a heavy  drinking session?: Monthly 7. How often during the last year have you had a feeling of guilt of remorse after drinking?: Monthly 8. How often during the last year have you been unable to remember what happened the night before because you had been drinking?: Weekly 9. Have you or someone else been injured as a result of your drinking?: Yes, during the last year 10. Has a relative or friend or a doctor or another health worker been concerned about your drinking or suggested you cut down?: Yes, but not in the last year Alcohol Use Disorder Identification Test Final Score (AUDIT): 23 Alcohol Brief Interventions/Follow-up: Alcohol education/Brief advice Substance Abuse History in the last 12 months:  Yes.  Reports 12 beers daily. Consequences of Substance Abuse: Negative.  Unable assess due to patient's mental status. Previous Psychotropic Medications: Yes .  Lithium, Abilify, trazodone Psychological Evaluations: No  Past Medical History:  Past Medical History:  Diagnosis Date   Bipolar 1 disorder (HCC)     Past Surgical History:  Procedure Laterality Date   EYE SURGERY     Family History:  Family History  Problem Relation Age of Onset   Hyperlipidemia Mother    Alcoholism Mother    Suicidality Cousin    Family Psychiatric  History: Patient reports no family history. Tobacco Screening:  Social History   Tobacco Use  Smoking Status Never  Smokeless Tobacco Never    BH Tobacco Counseling     Are you interested in Tobacco Cessation Medications?  No value filed. Counseled patient on smoking cessation:  No value filed. Reason Tobacco Screening Not Completed: No value filed.       Social History:  Social History   Substance and Sexual Activity  Alcohol Use Yes   Comment: weekly     Social History   Substance and Sexual Activity  Drug Use Not Currently    Additional Social History:  Lives with his mother and has a good relationship with her.  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    Allergies:  No Known Allergies Lab Results:  Results for orders placed or performed during the hospital encounter of 10/29/22 (from the past 48 hour(s))  TSH     Status: None   Collection Time: 10/29/22  6:18 AM  Result Value Ref Range   TSH 0.374 0.350 - 4.500 uIU/mL    Comment: Performed by a 3rd Generation assay with a functional sensitivity of <=0.01 uIU/mL. Performed at [facility], [address] [place], [place] [address]   Lipid panel     Status: None   Collection Time: 10/29/22  6:18 AM  Result Value Ref Range   Cholesterol 148 0 - 200 mg/dL   Triglycerides 80 <604 mg/dL   HDL 43 >54 mg/dL   Total CHOL/HDL Ratio 3.4 RATIO   VLDL 16 0 - 40 mg/dL   LDL Cholesterol 89 0 - 99 mg/dL    Comment:  Total Cholesterol/HDL:CHD Risk Coronary Heart Disease Risk Table                     Men   Women  1/2 Average Risk   3.4   3.3  Average Risk       5.0   4.4  2 X Average Risk   9.6   7.1  3 X Average Risk  23.4   11.0        Use the calculated Patient Ratio above and the CHD Risk Table to determine the patient's CHD Risk.        ATP III CLASSIFICATION (LDL):  <100     mg/dL   Optimal  811-914  mg/dL   Near or Above                    Optimal  130-159  mg/dL   Borderline  782-956  mg/dL   High  >213     mg/dL   Very High Performed at [facility], [address], [place], [place] [address]    BMP (7/13): Unremarkable except potassium of 3.0   Blood Alcohol level:  Lab Results  Component Value Date   ETH <10 10/28/2022   ETH <10 09/21/2017    Metabolic Disorder Labs:  Lab Results  Component Value Date   HGBA1C 5.5 12/31/2015   MPG 111 12/31/2015   MPG 114 12/30/2015   Lab Results  Component Value Date   PROLACTIN 16.0 (H) 12/31/2015   PROLACTIN 5.5 12/30/2015   Lab Results  Component Value Date   CHOL 148 10/29/2022   TRIG 80 10/29/2022   HDL 43 10/29/2022    CHOLHDL 3.4 10/29/2022   VLDL 16 10/29/2022   LDLCALC 89 10/29/2022   LDLCALC 144 (H) 12/31/2015    Current Medications: Current Facility-Administered Medications  Medication Dose Route Frequency Provider Last Rate Last Admin   acetaminophen (TYLENOL) tablet 650 mg  650 mg Oral Q6H PRN Bobbitt, Shalon E, NP       alum & mag hydroxide-simeth (MAALOX/MYLANTA) 200-200-20 MG/5ML suspension 30 mL  30 mL Oral Q4H PRN Bobbitt, Shalon E, NP       diphenhydrAMINE (BENADRYL) capsule 50 mg  50 mg Oral TID PRN Bobbitt, Shalon E, NP       Or   diphenhydrAMINE (BENADRYL) injection 50 mg  50 mg Intramuscular TID PRN Bobbitt, Shalon E, NP       haloperidol (HALDOL) tablet 5 mg  5 mg Oral TID PRN Bobbitt, Shalon E, NP       Or   haloperidol lactate (HALDOL) injection 5 mg  5 mg Intramuscular TID PRN Bobbitt, Shalon E, NP       hydrOXYzine (ATARAX) tablet 25 mg  25 mg Oral TID PRN Bobbitt, Shalon E, NP       hydrOXYzine (ATARAX) tablet 25 mg  25 mg Oral Q6H PRN Meryl Dare, MD       loperamide (IMODIUM) capsule 2-4 mg  2-4 mg Oral PRN Meryl Dare, MD       LORazepam (ATIVAN) tablet 2 mg  2 mg Oral TID PRN Bobbitt, Shalon E, NP       Or   LORazepam (ATIVAN) injection 2 mg  2 mg Intramuscular TID PRN Bobbitt, Shalon E, NP   2 mg at 10/29/22 1140   LORazepam (ATIVAN) tablet 1 mg  1 mg Oral Q6H PRN Meryl Dare, MD       LORazepam (ATIVAN)  tablet 1 mg  1 mg Oral QID Meryl Dare, MD       Followed by   Melene Muller ON 10/30/2022] LORazepam (ATIVAN) tablet 1 mg  1 mg Oral TID Meryl Dare, MD       Followed by   Melene Muller ON 10/31/2022] LORazepam (ATIVAN) tablet 1 mg  1 mg Oral BID Meryl Dare, MD       Followed by   Melene Muller ON 11/01/2022] LORazepam (ATIVAN) tablet 1 mg  1 mg Oral Daily Meryl Dare, MD       LORazepam (ATIVAN) tablet 2 mg  2 mg Oral Once Abbott Pao, Nadir, MD       magnesium hydroxide (MILK OF MAGNESIA) suspension 30 mL  30 mL Oral Daily PRN Bobbitt, Shalon E, NP       multivitamin with  minerals tablet 1 tablet  1 tablet Oral Daily Meryl Dare, MD   1 tablet at 10/29/22 1445   ondansetron (ZOFRAN-ODT) disintegrating tablet 4 mg  4 mg Oral Q6H PRN Meryl Dare, MD       potassium chloride SA (KLOR-CON M) CR tablet 40 mEq  40 mEq Oral Once Meryl Dare, MD       Melene Muller ON 10/30/2022] thiamine (Vitamin B-1) tablet 100 mg  100 mg Oral Daily Meryl Dare, MD       traZODone (DESYREL) tablet 50 mg  50 mg Oral QHS PRN Bobbitt, Shalon E, NP       PTA Medications: Medications Prior to Admission  Medication Sig Dispense Refill Last Dose   hydrOXYzine (VISTARIL) 50 MG capsule Take 50 mg by mouth 2 (two) times daily as needed.      losartan (COZAAR) 25 MG tablet Take 25 mg by mouth daily.      mirtazapine (REMERON) 30 MG tablet Take 30 mg by mouth at bedtime.      traZODone (DESYREL) 100 MG tablet Take 200 mg by mouth at bedtime.      QUEtiapine (SEROQUEL) 300 MG tablet Take 300 mg by mouth at bedtime.       Musculoskeletal: Strength & Muscle Tone: within normal limits Gait & Station: normal Patient leans: N/A    Psychiatric Specialty Exam:  Presentation  General Appearance: Appropriate for Environment  Eye Contact:Fair  Speech:Slow  Speech Volume:Decreased  Handedness:No data recorded  Mood and Affect  Mood:Euthymic  Affect:Flat (Indifferent)   Thought Process  Thought Processes:Irrevelant  Duration of Psychotic Symptoms:N/A Past Diagnosis of Schizophrenia or Psychoactive disorder: No  Descriptions of Associations:Intact  Orientation:Partial (Place and person.  Not time.  Does not know why he is here)  Thought Content:Other (comment) (Confused.  Irrelevant statements.)  Hallucinations:Hallucinations: None  Ideas of Reference:None  Suicidal Thoughts:Suicidal Thoughts: No  Homicidal Thoughts:Homicidal Thoughts: No   Sensorium  Memory:Immediate Poor; Recent Poor; Remote Poor (Profound memory  issues)  Judgment:Impaired  Insight:Poor   Executive Functions  Concentration:Poor  Attention Span:Poor  Recall:Poor  Fund of Knowledge:Poor  Language:Fair   Psychomotor Activity  Psychomotor Activity:Psychomotor Activity: Decreased   Assets  Assets:Social Support; Housing; Financial Resources/Insurance   Sleep  Sleep:Sleep: Fair Number of Hours of Sleep: 8    Physical Exam: Physical Exam Vitals and nursing note reviewed.  HENT:     Head: Normocephalic and atraumatic.  Pulmonary:     Effort: Pulmonary effort is normal.  Neurological:     General: No focal deficit present.     Mental Status: He is alert. He is disoriented and confused.     Motor: No weakness or  tremor.     Gait: Gait is intact.     Comments: Oriented to person and place but not time.  Intermittent repetitive mouth movements.  Attention not intact as evident by reciting 0 serial numbers after 1 minute  Gait intact although patient reports subjective balance issues over the last day or so and 1 fall.  No nystagmus.  Psychiatric:        Attention and Perception: He is inattentive. He does not perceive auditory or visual hallucinations.        Mood and Affect: Mood normal.        Speech: He is noncommunicative.        Thought Content: Thought content does not include homicidal or suicidal ideation.        Cognition and Memory: Memory is impaired.    Review of Systems  Constitutional:  Negative for fever.  Cardiovascular:  Negative for chest pain and palpitations.  Gastrointestinal:  Negative for constipation, diarrhea, nausea and vomiting.  Neurological:  Negative for dizziness, tremors, weakness and headaches.  Psychiatric/Behavioral:  Negative for hallucinations and suicidal ideas.    Blood pressure (!) 158/77, pulse (!) 50, temperature 98.2 F (36.8 C), temperature source Oral, resp. rate 17, height 5\' 10"  (1.778 m), weight 77.1 kg, SpO2 99%. Body mass index is 24.39  kg/m.    ASSESSMENT & PLAN  ASSESSMENT:   Diagnoses / Active Problems: Wernicke's encephalopathy versus alcohol withdrawal with delirium tremens Alcohol use disorder Bipolar 1 disorder Rule out major neurocognitive disorder  Patient presents with problems of 1 week history of memory loss, confusion, inattention, subjective balance issues per patient; on exam, intermittent repetitive mouth movements; 12 beers a day alcohol use;  medication non adherence.  These problems raise concern for Warnecke's encephalopathy but may also be related to alcohol withdrawal, although alcohol withdrawal signs/symptoms are not present (although he is on Ativan taper).  Unlikely Korsakoff syndrome due to timeline of 1 week, although retrograde amnesia is present.  Given the concern for Wernicke's, the patient has received thiamine 100 mg IM twice in [facility] (also received a dose in ED).  He is on CIWA protocol with Ativan taper.  He currently does not appear to have any signs or symptoms of mania, but we will follow as his encephalopathy improves.  Additionally he has hypokalemia at 3.0.  He received 1 dose of 40 mEq oral in the ED and we will give him another 40 today and recheck his BMP in the morning.  We will reassess restarting medications for bipolar 1 disorder once the patient is stable with regard to Wernicke encephalopathy.     PLAN: Safety and Monitoring:             -- Involuntary admission to inpatient psychiatric unit for safety, stabilization and treatment             -- Daily contact with patient to assess and evaluate symptoms and progress in treatment             -- Patient's case to be discussed in multi-disciplinary team meeting             -- Observation Level : q15 minute checks             -- Vital signs:  q12 hours             -- Precautions: suicide, elopement, and assault   2. Psychiatric Diagnoses and Treatment:  -- Bipolar 1 disorder: Reassess and discuss restarting mood stabilizer  once patient has improvement in mentation. --  The risks/benefits/side-effects/alternatives to this medication were discussed in detail with the patient and time was given for questions. The patient consents to medication trial.              -- Metabolic profile and EKG monitoring obtained while on an atypical antipsychotic. See #4 below for values.              -- Encouraged patient to participate in unit milieu and in scheduled group therapies              -- Short Term Goals: Ability to identify changes in lifestyle to reduce recurrence of condition will improve, Ability to verbalize feelings will improve, Ability to disclose and discuss suicidal ideas, Ability to demonstrate self-control will improve, Ability to identify and develop effective coping behaviors will improve, Ability to maintain clinical measurements within normal limits will improve, Compliance with prescribed medications will improve, and Ability to identify triggers associated with substance abuse/mental health issues will improve             -- Long Term Goals: Improvement in symptoms so as ready for discharge                3. Medical Issues Being Addressed:              -- Wernicke's encephalopathy: Received 3 IM doses of thiamine (1 in ED, 2 in [facility], 300 mg total).  Continue thiamine 100 mg oral once daily.  Continue multivitamin once daily.  -- Alcohol withdrawal: Started CIWA protocol with Ativan taper (will complete on Wednesday)  -- Hypokalemia: 1 more dose of KCl 40 mL equivalents, repeat BMP in a.m.    4. Routine and other pertinent labs reviewed: EKG monitoring: QTc: 436  Metabolism / endocrine: BMI: Body mass index is 24.39 kg/m. Prolactin: Lab Results  Component Value Date   PROLACTIN 16.0 (H) 12/31/2015   PROLACTIN 5.5 12/30/2015   Lipid Panel: Lab Results  Component Value Date   CHOL 148 10/29/2022   TRIG 80 10/29/2022   HDL 43 10/29/2022   CHOLHDL 3.4 10/29/2022   VLDL 16 10/29/2022   LDLCALC 89  10/29/2022   LDLCALC 144 (H) 12/31/2015   HbgA1c: Hgb A1c MFr Bld (%)  Date Value  12/31/2015 5.5   TSH: TSH (uIU/mL)  Date Value  10/29/2022 0.374  04/15/2012 0.750    Labs to order: Repeat BMP in a.m.  5. Discharge Planning:              -- Social work and case management to assist with discharge planning and identification of hospital follow-up needs prior to discharge             -- Estimated LOS: 7-10 days              -- Discharge Concerns: Need to establish a safety plan; Medication compliance and effectiveness             -- Discharge Goals: Return home with outpatient referrals for mental health follow-up including medication management/psychotherapy   I certify that inpatient services furnished can reasonably be expected to improve the patient's condition.    Meryl Dare, MD 7/14/20244:09 PM

## 2022-10-29 NOTE — [facility] Group Notes (Signed)
[facility] Group Notes:  (Nursing/MHT/Case Management/Adjunct)  Date:  10/29/2022  Time:  11:04 AM  Type of Therapy:  Psychoeducational Skills  Participation Level:  Active  Participation Quality:  Appropriate and Attentive  Affect:  Appropriate  Cognitive:  Alert and Appropriate  Insight:  Appropriate  Engagement in Group:  Engaged  Modes of Intervention:  Discussion, Education, and Exploration  Summary of Progress/Problems: Patients were given education on identifying warning signs of acute mental health decompensation. They were asked to reflect on recognizing their own signs before hospitalization and identify healthy coping skills to integrate to promote mental wellbeing. Themes of symbolism used to help patients identify how mental wellbeing is integral to daily life, and no one piece can operate alone with car analogy. Patient attended and participated.   [name] 10/29/2022, 11:04 AM

## 2022-10-29 NOTE — [facility] Suicide Risk Assessment (Signed)
Suicide Risk Assessment  Admission Assessment    [facility] Admission Suicide Risk Assessment   Nursing information obtained from:  Patient Demographic factors:  Male, Caucasian, Divorced or widowed Current Mental Status:  Suicidal ideation indicated by patient Loss Factors:  Decline in physical health, Financial problems / change in socioeconomic status Historical Factors:  Impulsivity Risk Reduction Factors:  Living with another person, especially a relative, Positive coping skills or problem solving skills  Total Time spent with patient: 45 minutes Principal Problem: <principal problem not specified> Diagnosis:  Active Problems:   Bipolar 1 disorder (HCC)  Subjective Data:  [name] is a 62 year old male with a past psychiatric history of bipolar 1 disorder, alcohol use disorder; numerous hospitalizations in the past (unclear reasons); no self-harm or suicide attempts.  He presents from the ED to the behavioral [facility] for concern for mania.   Due to confusion and memory issues, the patient is unable to give a reliable or accurate history.  When asked why the patient is here, he says "playing guitar," and does not describe anything described in the emergency department notes.  He says he has not been adhered to his medications due to transportation issues.  He reports no concerns for why he is here, despite being prompted with information.  He is able to provide past psychiatric history and substance use history but no history of presenting illness.   Collateral (mother, [phone]): She reports that the patient has been doing with the past 5 years without any manic episodes or other mental concerns.  For a long time he had been lithium but was recently switched to Abilify by his outpatient psychiatrist and was stable on this medication.  Apparently [name] told her that he had not been taking his medications recently and for no clear reason.  She reports that he uses alcohol and  "smells like a brewery" but uses no other substances to her knowledge.  She accompanied him to see his outpatient psychiatrist this past Friday.  She confirms that he has been having memory loss recently and furthermore that he believes he had received a large sum of money from a lawsuit he is currently involved in.  She did not notice any of the mouth movements that were noted on exam today.  She reports that he has never had an episode the past with this sort of memory loss.   Continued Clinical Symptoms:  Alcohol Use Disorder Identification Test Final Score (AUDIT): 23 The "Alcohol Use Disorders Identification Test", Guidelines for Use in Primary Care, Second Edition.  World Science writer [facility]). Score between 0-7:  no or low risk or alcohol related problems. Score between 8-15:  moderate risk of alcohol related problems. Score between 16-19:  high risk of alcohol related problems. Score 20 or above:  warrants further diagnostic evaluation for alcohol dependence and treatment.   CLINICAL FACTORS:   Bipolar 1 disorder   Musculoskeletal: Strength & Muscle Tone: within normal limits Gait & Station: normal Patient leans: N/A  Psychiatric Specialty Exam:  Presentation  General Appearance:  Appropriate for Environment  Eye Contact: Fair  Speech: Slow  Speech Volume: Decreased  Handedness:No data recorded  Mood and Affect  Mood: Euthymic  Affect: Flat (Indifferent)   Thought Process  Thought Processes: Irrevelant  Descriptions of Associations:Intact  Orientation:Partial (Place and person.  Not time.  Does not know why he is here)  Thought Content:Other (comment) (Confused.  Irrelevant statements.)  History of Schizophrenia/Schizoaffective disorder:No  Duration of Psychotic Symptoms:N/A  Hallucinations:Hallucinations: None  Ideas of Reference:None  Suicidal Thoughts:Suicidal Thoughts: No  Homicidal Thoughts:Homicidal Thoughts: No   Sensorium   Memory: Immediate Poor; Recent Poor; Remote Poor (Profound memory issues)  Judgment: Impaired  Insight: Poor   Executive Functions  Concentration: Poor  Attention Span: Poor  Recall: Poor  Fund of Knowledge: Poor  Language: Fair   Lexicographer Activity: Psychomotor Activity: Decreased   Assets  Assets: Social Support; Housing; Financial Resources/Insurance   Sleep  Sleep: Sleep: Fair Number of Hours of Sleep: 8    Physical Exam Vitals and nursing note reviewed.  HENT:     Head: Normocephalic and atraumatic.  Pulmonary:     Effort: Pulmonary effort is normal.  Neurological:     General: No focal deficit present.     Mental Status: He is alert. He is disoriented and confused.     Motor: No weakness or tremor.     Gait: Gait is intact.     Comments: Oriented to person and place but not time.   Intermittent repetitive mouth movements.   Attention not intact as evident by reciting 0 serial numbers after 1 minute   Gait intact although patient reports subjective balance issues over the last day or so and 1 fall.   No nystagmus.  Psychiatric:        Attention and Perception: He is inattentive. He does not perceive auditory or visual hallucinations.        Mood and Affect: Mood normal.        Speech: He is noncommunicative.        Thought Content: Thought content does not include homicidal or suicidal ideation.        Cognition and Memory: Memory is impaired.      Review of Systems  Constitutional:  Negative for fever.  Cardiovascular:  Negative for chest pain and palpitations.  Gastrointestinal:  Negative for constipation, diarrhea, nausea and vomiting.  Neurological:  Negative for dizziness, tremors, weakness and headaches.  Psychiatric/Behavioral:  Negative for hallucinations and suicidal ideas.      Blood pressure (!) 158/77, pulse (!) 50, temperature 98.2 F (36.8 C), temperature source Oral, resp. rate 17, height 5'  10" (1.778 m), weight 77.1 kg, SpO2 99%. Body mass index is 24.39 kg/m.   COGNITIVE FEATURES THAT CONTRIBUTE TO RISK:  None    SUICIDE RISK:   Mild:  Suicidal ideation of limited frequency, intensity, duration, and specificity.  There are no identifiable plans, no associated intent, mild dysphoria and related symptoms, good self-control (both objective and subjective assessment), few other risk factors, and identifiable protective factors, including available and accessible social support.  PLAN OF CARE:  ASSESSMENT & PLAN   ASSESSMENT:   Diagnoses / Active Problems: Wernicke's encephalopathy versus alcohol withdrawal with delirium tremens Alcohol use disorder Bipolar 1 disorder Rule out major neurocognitive disorder   Patient presents with problems of 1 week history of memory loss, confusion, inattention, subjective balance issues per patient; on exam, intermittent repetitive mouth movements; 12 beers a day alcohol use;  medication non adherence.  These problems raise concern for Warnecke's encephalopathy but may also be related to alcohol withdrawal, although alcohol withdrawal signs/symptoms are not present (although he is on Ativan taper).  Unlikely Korsakoff syndrome due to timeline of 1 week, although retrograde amnesia is present.  Given the concern for Wernicke's, the patient has received thiamine 100 mg IM twice in [facility] (also received a dose in ED).  He is on CIWA protocol with  Ativan taper.  He currently does not appear to have any signs or symptoms of mania, but we will follow as his encephalopathy improves.  Additionally he has hypokalemia at 3.0.  He received 1 dose of 40 mEq oral in the ED and we will give him another 40 today and recheck his BMP in the morning.  We will reassess restarting medications for bipolar 1 disorder once the patient is stable with regard to Wernicke encephalopathy.     PLAN: Safety and Monitoring:             -- Involuntary admission to inpatient  psychiatric unit for safety, stabilization and treatment             -- Daily contact with patient to assess and evaluate symptoms and progress in treatment             -- Patient's case to be discussed in multi-disciplinary team meeting             -- Observation Level : q15 minute checks             -- Vital signs:  q12 hours             -- Precautions: suicide, elopement, and assault   2. Psychiatric Diagnoses and Treatment:  -- Bipolar 1 disorder: Reassess and discuss restarting mood stabilizer once patient has improvement in mentation. --  The risks/benefits/side-effects/alternatives to this medication were discussed in detail with the patient and time was given for questions. The patient consents to medication trial.              -- Metabolic profile and EKG monitoring obtained while on an atypical antipsychotic. See #4 below for values.              -- Encouraged patient to participate in unit milieu and in scheduled group therapies              -- Short Term Goals: Ability to identify changes in lifestyle to reduce recurrence of condition will improve, Ability to verbalize feelings will improve, Ability to disclose and discuss suicidal ideas, Ability to demonstrate self-control will improve, Ability to identify and develop effective coping behaviors will improve, Ability to maintain clinical measurements within normal limits will improve, Compliance with prescribed medications will improve, and Ability to identify triggers associated with substance abuse/mental health issues will improve             -- Long Term Goals: Improvement in symptoms so as ready for discharge                3. Medical Issues Being Addressed:              -- Wernicke's encephalopathy: Received 3 IM doses of thiamine (1 in ED, 2 in [facility], 300 mg total).  Continue thiamine 100 mg oral once daily.  Continue multivitamin once daily.             -- Alcohol withdrawal: Started CIWA protocol with Ativan taper (will complete  on Wednesday)             -- Hypokalemia: 1 more dose of KCl 40 mL equivalents, repeat BMP in a.m.     4. Routine and other pertinent labs reviewed: EKG monitoring: QTc: 436   Metabolism / endocrine: BMI: Body mass index is 24.39 kg/m. Prolactin: Recent Labs       Lab Results  Component Value Date    PROLACTIN 16.0 (H) 12/31/2015  PROLACTIN 5.5 12/30/2015      Lipid Panel: Recent Labs       Lab Results  Component Value Date    CHOL 148 10/29/2022    TRIG 80 10/29/2022    HDL 43 10/29/2022    CHOLHDL 3.4 10/29/2022    VLDL 16 10/29/2022    LDLCALC 89 10/29/2022    LDLCALC 144 (H) 12/31/2015      HbgA1c: Last Labs     Hgb A1c MFr Bld (%)  Date Value  12/31/2015 5.5      TSH: Last Labs     TSH (uIU/mL)  Date Value  10/29/2022 0.374  04/15/2012 0.750        Labs to order: Repeat BMP in a.m.   5. Discharge Planning:              -- Social work and case management to assist with discharge planning and identification of hospital follow-up needs prior to discharge             -- Estimated LOS: 7-10 days              -- Discharge Concerns: Need to establish a safety plan; Medication compliance and effectiveness             -- Discharge Goals: Return home with outpatient referrals for mental health follow-up including medication management/psychotherapy     I certify that inpatient services furnished can reasonably be expected to improve the patient's condition.    I certify that inpatient services furnished can reasonably be expected to improve the patient's condition.   Meryl Dare, MD 10/29/2022, 4:13 PM

## 2022-10-29 NOTE — Group Note (Signed)
Date:  10/29/2022 Time:  9:40 AM  Group Topic/Focus:  Goals Group:   The focus of this group is to help patients establish daily goals to achieve during treatment and discuss how the patient can incorporate goal setting into their daily lives to aide in recovery.    Participation Level:  Active  Participation Quality:  Appropriate  Affect:  Appropriate  Cognitive:  Appropriate  Insight: Appropriate  Engagement in Group:  Engaged  Modes of Intervention:  Education  Additional Comments:  PT participated in positive affirmation   Gwinda Maine 10/29/2022, 9:40 AM

## 2022-10-29 NOTE — ED Notes (Signed)
Voluntary consent to go to [facility] signed by patient d/t IVC paperwork not complete. Patient will be going to room 405-2 at [facility]. Called report to [place], Charity fundraiser at [facility]. Called [facility] to transport to [facility].

## 2022-10-29 NOTE — Plan of Care (Signed)
  Problem: Education: Goal: Knowledge of [facility] Education information/materials will improve 10/29/2022 0705 by Lahoma Crocker, RN Outcome: Progressing 10/29/2022 0704 by Lahoma Crocker, RN Outcome: Progressing Goal: Emotional status will improve 10/29/2022 0705 by Lahoma Crocker, RN Outcome: Progressing 10/29/2022 0704 by Lahoma Crocker, RN Outcome: Progressing Goal: Mental status will improve 10/29/2022 0705 by Lahoma Crocker, RN Outcome: Progressing 10/29/2022 0704 by Lahoma Crocker, RN Outcome: Progressing Goal: Verbalization of understanding the information provided will improve 10/29/2022 0705 by Lahoma Crocker, RN Outcome: Progressing 10/29/2022 0704 by Lahoma Crocker, RN Outcome: Progressing   Problem: Activity: Goal: Interest or engagement in activities will improve Outcome: Progressing Goal: Sleeping patterns will improve Outcome: Progressing   Problem: Coping: Goal: Ability to verbalize frustrations and anger appropriately will improve Outcome: Progressing Goal: Ability to demonstrate self-control will improve Outcome: Progressing   Problem: Health Behavior/Discharge Planning: Goal: Identification of resources available to assist in meeting health care needs will improve 10/29/2022 0705 by Lahoma Crocker, RN Outcome: Progressing 10/29/2022 0704 by Lahoma Crocker, RN Outcome: Progressing Goal: Compliance with treatment plan for underlying cause of condition will improve Outcome: Progressing   Problem: Physical Regulation: Goal: Ability to maintain clinical measurements within normal limits will improve Outcome: Progressing   Problem: Safety: Goal: Periods of time without injury will increase 10/29/2022 0705 by Lahoma Crocker, RN Outcome: Progressing 10/29/2022 0704 by Lahoma Crocker, RN Outcome: Progressing 10/29/2022 0703 by Lahoma Crocker, RN Outcome: Progressing

## 2022-10-29 NOTE — [facility] Group Notes (Signed)
Adult Psychoeducational Group Note  Date:  10/29/2022 Time:  4:25 PM  Group Topic/Focus:  Building Self Esteem:   The Focus of this group is helping patients become aware of the effects of self-esteem on their lives, the things they and others do that enhance or undermine their self-esteem, seeing the relationship between their level of self-esteem and the choices they make and learning ways to enhance self-esteem.  Participation Level:  Did Not Attend  Participation Quality:   PT did not attend   Affect:   PT did not attend  Cognitive:   PT did not attend  Insight: None  Engagement in Group:   PT did not attend  Modes of Intervention:   PT did not attend  Additional Comments:  PT did not attend group  Gwinda Maine 10/29/2022, 4:25 PM

## 2022-10-30 ENCOUNTER — Encounter (HOSPITAL_COMMUNITY): Payer: Self-pay

## 2022-10-30 ENCOUNTER — Other Ambulatory Visit (HOSPITAL_COMMUNITY): Payer: Self-pay

## 2022-10-30 DIAGNOSIS — F319 Bipolar disorder, unspecified: Secondary | ICD-10-CM | POA: Diagnosis not present

## 2022-10-30 LAB — BASIC METABOLIC PANEL
Anion gap: 9 (ref 5–15)
BUN: 16 mg/dL (ref 6–20)
CO2: 20 mmol/L — ABNORMAL LOW (ref 22–32)
Calcium: 9.6 mg/dL (ref 8.9–10.3)
Chloride: 108 mmol/L (ref 98–111)
Creatinine, Ser: 1.26 mg/dL — ABNORMAL HIGH (ref 0.61–1.24)
GFR, Estimated: >60 mL/min (ref >60)
Glucose, Bld: 136 mg/dL — ABNORMAL HIGH (ref 70–99)
Potassium: 4 mmol/L (ref 3.5–5.1)
Sodium: 137 mmol/L (ref 135–145)

## 2022-10-30 MED ORDER — VITAMIN B-1 100 MG PO TABS
100.0000 mg | ORAL_TABLET | Freq: Every day | ORAL | Status: AC
  Administered 2022-10-30: 100 mg via ORAL
  Filled 2022-10-30: qty 1

## 2022-10-30 MED ORDER — THIAMINE HCL 100 MG/ML IJ SOLN
100.0000 mg | Freq: Three times a day (TID) | INTRAMUSCULAR | Status: DC
  Filled 2022-10-30: qty 2

## 2022-10-30 MED ORDER — THIAMINE HCL 100 MG/ML IJ SOLN
100.0000 mg | Freq: Three times a day (TID) | INTRAMUSCULAR | Status: DC
  Administered 2022-10-30 – 2022-11-01 (×6): 100 mg via INTRAMUSCULAR
  Filled 2022-10-30 (×5): qty 2

## 2022-10-30 MED ORDER — ARIPIPRAZOLE 10 MG PO TABS
10.0000 mg | ORAL_TABLET | Freq: Every day | ORAL | Status: DC
  Administered 2022-10-30 – 2022-11-02 (×4): 10 mg via ORAL
  Filled 2022-10-30 (×7): qty 1

## 2022-10-30 MED ORDER — THIAMINE HCL 100 MG/ML IJ SOLN
100.0000 mg | Freq: Three times a day (TID) | INTRAMUSCULAR | Status: DC
  Administered 2022-10-30: 100 mg via INTRAMUSCULAR

## 2022-10-30 MED ORDER — VITAMIN B-1 100 MG PO TABS: 100.0000 mg | ORAL_TABLET | Freq: Every day | ORAL | Status: DC

## 2022-10-30 NOTE — Plan of Care (Signed)
  Problem: Education: Goal: Knowledge of [facility] Education information/materials will improve Outcome: Progressing   Problem: Physical Regulation: Goal: Ability to maintain clinical measurements within normal limits will improve Outcome: Progressing   Problem: Safety: Goal: Periods of time without injury will increase Outcome: Progressing   

## 2022-10-30 NOTE — Progress Notes (Signed)
[facility] MD Progress Note  10/30/2022 2:08 PM [name]  MRN:  [id number] Subjective:   [name] is a 62 year old male with a past psychiatric history of bipolar 1 disorder, alcohol use disorder; numerous hospitalizations in the past (unclear reasons); no self-harm or suicide attempts. He presents from the ED to the behavioral health [facility] for concern for mania.    Case was discussed in the multidisciplinary team. MAR was reviewed and patient is compliant with medications.   Psychiatric Team made the following recommendations yesterday: Gave 2 doses of thiamine 100 mg intramuscular. Started CIWA protocol with Ativan taper  On interview today the patient reports subjective improvement since yesterday.  He said that when he came the Emergency Department that he was "feeling manic."  When asked to collaborate, he said he was having impulses to spend after seeing nice cars pass by.  He also reports subtle changes that he has difficulty described.  However is not experiencing the symptoms but still feels "different."  He reported that he was on lithium for a long time but recently got switched to Abilify 10 mg once daily and that he would like to be on a long-acting injectable.  Also clarified that he does not drink 12 beers daily and that he only drinks on the weekends and very rare later in the week.  Collateral [name], mother, [phone]): Mother spoke with the patient yesterday and reports that he sounds much more like himself.  She is not able to come in person but she agreed to talk to the patient nightly and give [place] updates on how his mentation is doing.  She reports no other concerns.   Principal Problem: Wernicke encephalopathy Diagnosis: Principal Problem:   Wernicke encephalopathy Active Problems:   Bipolar 1 disorder (HCC)   Alcohol use disorder  Total Time spent with patient: 15 minutes  Past Psychiatric History: bipolar 1 disorder, alcohol use disorder; numerous  hospitalizations in the past (unclear reasons); no self-harm or suicide attempts   Past Medical History:  Past Medical History:  Diagnosis Date   Bipolar 1 disorder (HCC)     Past Surgical History:  Procedure Laterality Date   EYE SURGERY     Family History:  Family History  Problem Relation Age of Onset   Hyperlipidemia Mother    Alcoholism Mother    Suicidality Cousin    Family Psychiatric  History: Patient reports no family history. Social History:  Social History   Substance and Sexual Activity  Alcohol Use Yes   Comment: weekly     Social History   Substance and Sexual Activity  Drug Use Not Currently    Social History   Socioeconomic History   Marital status: Single    Spouse name: Not on file   Number of children: Not on file   Years of education: Not on file   Highest education level: Not on file  Occupational History   Not on file  Tobacco Use   Smoking status: Never   Smokeless tobacco: Never  Vaping Use   Vaping status: Never Used  Substance and Sexual Activity   Alcohol use: Yes    Comment: weekly   Drug use: Not Currently   Sexual activity: Not on file  Other Topics Concern   Not on file  Social History Narrative   Not on file   Social Determinants of Health   Financial Resource Strain: Not on file  Food Insecurity: No Food Insecurity (10/29/2022)   Hunger Vital Sign  Worried About Programme researcher, broadcasting/film/video in the Last Year: Never true    Ran Out of Food in the Last Year: Never true  Transportation Needs: No Transportation Needs (10/29/2022)   PRAPARE - Administrator, Civil Service (Medical): No    Lack of Transportation (Non-Medical): No  Physical Activity: Not on file  Stress: Not on file  Social Connections: Not on file   Additional Social History:   Lives with his mother and has a good relationship with her.  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     Sleep: Good slept from 11-8 totaling 9  hours  Appetite:  Good  Current Medications: Current Facility-Administered Medications  Medication Dose Route Frequency Provider Last Rate Last Admin   acetaminophen (TYLENOL) tablet 650 mg  650 mg Oral Q6H PRN Bobbitt, Shalon E, NP   650 mg at 10/30/22 0624   alum & mag hydroxide-simeth (MAALOX/MYLANTA) 200-200-20 MG/5ML suspension 30 mL  30 mL Oral Q4H PRN Bobbitt, Shalon E, NP       ARIPiprazole (ABILIFY) tablet 10 mg  10 mg Oral Daily Meryl Dare, MD       diphenhydrAMINE (BENADRYL) capsule 50 mg  50 mg Oral TID PRN Bobbitt, Shalon E, NP       Or   diphenhydrAMINE (BENADRYL) injection 50 mg  50 mg Intramuscular TID PRN Bobbitt, Shalon E, NP       feeding supplement (ENSURE ENLIVE / ENSURE PLUS) liquid 237 mL  237 mL Oral BID BM Nwoko, Agnes I, NP   237 mL at 10/30/22 1403   haloperidol (HALDOL) tablet 5 mg  5 mg Oral TID PRN Bobbitt, Shalon E, NP       Or   haloperidol lactate (HALDOL) injection 5 mg  5 mg Intramuscular TID PRN Bobbitt, Shalon E, NP       hydrOXYzine (ATARAX) tablet 25 mg  25 mg Oral TID PRN Bobbitt, Shalon E, NP   25 mg at 10/29/22 2056   hydrOXYzine (ATARAX) tablet 25 mg  25 mg Oral Q6H PRN Meryl Dare, MD       loperamide (IMODIUM) capsule 2-4 mg  2-4 mg Oral PRN Meryl Dare, MD       LORazepam (ATIVAN) tablet 2 mg  2 mg Oral TID PRN Bobbitt, Shalon E, NP       Or   LORazepam (ATIVAN) injection 2 mg  2 mg Intramuscular TID PRN Bobbitt, Shalon E, NP   2 mg at 10/29/22 1140   LORazepam (ATIVAN) tablet 1 mg  1 mg Oral Q6H PRN Meryl Dare, MD       LORazepam (ATIVAN) tablet 1 mg  1 mg Oral TID Meryl Dare, MD   1 mg at 10/30/22 1114   Followed by   Melene Muller ON 10/31/2022] LORazepam (ATIVAN) tablet 1 mg  1 mg Oral BID Meryl Dare, MD       Followed by   Melene Muller ON 11/01/2022] LORazepam (ATIVAN) tablet 1 mg  1 mg Oral Daily Meryl Dare, MD       LORazepam (ATIVAN) tablet 2 mg  2 mg Oral Once Attiah, Nadir, MD       magnesium hydroxide (MILK OF MAGNESIA)  suspension 30 mL  30 mL Oral Daily PRN Bobbitt, Shalon E, NP       multivitamin with minerals tablet 1 tablet  1 tablet Oral Daily Meryl Dare, MD   1 tablet at 10/30/22 0824   ondansetron (ZOFRAN-ODT) disintegrating tablet 4  mg  4 mg Oral Q6H PRN Meryl Dare, MD       thiamine (VITAMIN B1) injection 100 mg  100 mg Intramuscular TID Abbott Pao, Nadir, MD   100 mg at 10/30/22 1116   Followed by   Melene Muller ON 11/03/2022] thiamine (Vitamin B-1) tablet 100 mg  100 mg Oral Daily Attiah, Nadir, MD       traZODone (DESYREL) tablet 50 mg  50 mg Oral QHS PRN Bobbitt, Shalon E, NP   50 mg at 10/29/22 2056    Lab Results:  Results for orders placed or performed during the hospital encounter of 10/29/22 (from the past 48 hour(s))  TSH     Status: None   Collection Time: 10/29/22  6:18 AM  Result Value Ref Range   TSH 0.374 0.350 - 4.500 uIU/mL    Comment: Performed by a 3rd Generation assay with a functional sensitivity of <=0.01 uIU/mL. Performed at [facility], [address], [place], [place] [address]   Lipid panel     Status: None   Collection Time: 10/29/22  6:18 AM  Result Value Ref Range   Cholesterol 148 0 - 200 mg/dL   Triglycerides 80 <130 mg/dL   HDL 43 >86 mg/dL   Total CHOL/HDL Ratio 3.4 RATIO   VLDL 16 0 - 40 mg/dL   LDL Cholesterol 89 0 - 99 mg/dL    Comment:        Total Cholesterol/HDL:CHD Risk Coronary Heart Disease Risk Table                     Men   Women  1/2 Average Risk   3.4   3.3  Average Risk       5.0   4.4  2 X Average Risk   9.6   7.1  3 X Average Risk  23.4   11.0        Use the calculated Patient Ratio above and the CHD Risk Table to determine the patient's CHD Risk.        ATP III CLASSIFICATION (LDL):  <100     mg/dL   Optimal  578-469  mg/dL   Near or Above                    Optimal  130-159  mg/dL   Borderline  629-528  mg/dL   High  >413     mg/dL   Very High Performed at [facility], [address] [place], [place] [address]   Basic metabolic panel     Status: Abnormal   Collection Time: 10/30/22  6:26 AM  Result Value Ref Range   Sodium 137 135 - 145 mmol/L   Potassium 4.0 3.5 - 5.1 mmol/L   Chloride 108 98 - 111 mmol/L   CO2 20 (L) 22 - 32 mmol/L   Glucose, Bld 136 (H) 70 - 99 mg/dL    Comment: Glucose reference range applies only to samples taken after fasting for at least 8 hours.   BUN 16 6 - 20 mg/dL   Creatinine, Ser 0.27 (H) 0.61 - 1.24 mg/dL   Calcium 9.6 8.9 - 25.3 mg/dL   GFR, Estimated >66 >44 mL/min    Comment: (NOTE) Calculated using the CKD-EPI Creatinine Equation (2021)    Anion gap 9 5 - 15    Comment: Performed at [facility], [address] [place], [place] [address]    Blood Alcohol level:  Lab Results  Component Value Date   ETH <10 10/28/2022   ETH <10 09/21/2017    Metabolic Disorder Labs: Lab Results  Component Value Date   HGBA1C 5.5 12/31/2015   MPG 111 12/31/2015   MPG 114 12/30/2015   Lab Results  Component Value Date   PROLACTIN 16.0 (H) 12/31/2015   PROLACTIN 5.5 12/30/2015   Lab Results  Component Value Date   CHOL 148 10/29/2022   TRIG 80 10/29/2022   HDL 43 10/29/2022   CHOLHDL 3.4 10/29/2022   VLDL 16 10/29/2022   LDLCALC 89 10/29/2022   LDLCALC 144 (H) 12/31/2015    Physical Findings: CIWA:  CIWA-Ar Total: 0   Musculoskeletal: Strength & Muscle Tone: within normal limits Gait & Station: normal Patient leans: N/A  Psychiatric Specialty Exam:  Presentation  General Appearance:  Appropriate for Environment  Eye Contact: Good  Speech: -- (Rate increased otherwise normal)  Speech Volume: Normal  Handedness:No data recorded  Mood and Affect  Mood: Euthymic  Affect: Flat   Thought Process  Thought Processes: Linear  Descriptions of Associations:Intact  Orientation:Full (Time, Place and Person)  Thought Content:Logical  History of Schizophrenia/Schizoaffective  disorder:No  Duration of Psychotic Symptoms:N/A  Hallucinations:Hallucinations: None  Ideas of Reference:None  Suicidal Thoughts:Suicidal Thoughts: No  Homicidal Thoughts:Homicidal Thoughts: No   Sensorium  Memory: Immediate Fair; Recent Poor; Remote Fair  Judgment: Poor  Insight: Poor   Executive Functions  Concentration: Fair  Attention Span: Fair (Recited 5 serial numbers in 1 minute)  Recall: [employer] of Knowledge: Fair  Language: Fair   Psychomotor Activity  Psychomotor Activity: Psychomotor Activity: Normal   Assets  Assets: Social Support; Housing; Financial Resources/Insurance   Sleep  Sleep: Sleep: Good Number of Hours of Sleep: 9    Physical Exam: Physical Exam Vitals and nursing note reviewed.  HENT:     Head: Normocephalic and atraumatic.  Pulmonary:     Effort: Pulmonary effort is normal.  Neurological:     General: No focal deficit present.     Mental Status: He is alert.  Psychiatric:        Behavior: Behavior is cooperative.        Thought Content: Thought content normal.     Comments: Continued mild confusion.  Improved attention compared to yesterday as evidenced by remembering 5 serial numbers in 1 minute versus 0 numbers yesterday.    Review of Systems  Constitutional:  Negative for fever.  Cardiovascular:  Negative for chest pain and palpitations.  Gastrointestinal:  Negative for constipation, diarrhea, nausea and vomiting.  Neurological:  Negative for dizziness, weakness and headaches.  Psychiatric/Behavioral:  Negative for depression, hallucinations, memory loss and suicidal ideas.    Blood pressure (!) 141/87, pulse (!) 58, temperature 98.2 F (36.8 C), temperature source Oral, resp. rate 16, height 5\' 10"  (1.778 m), weight 77.1 kg, SpO2 100%. Body mass index is 24.39 kg/m.   ASSESSMENT & PLAN   ASSESSMENT:   Diagnoses / Active Problems: Wernicke's encephalopathy versus alcohol withdrawal with  delirium tremens Alcohol use disorder Bipolar 1 disorder Rule out major neurocognitive disorder   Patient presents with problems of 1 week history of memory loss, confusion, inattention, subjective balance issues per patient; on exam, intermittent repetitive mouth movements; 12 beers a day alcohol use;  medication non adherence.  These problems raise concern for Warnecke's encephalopathy but may also be related to alcohol withdrawal, although alcohol withdrawal signs/symptoms are not present (although he is on Ativan taper).  Unlikely Korsakoff  syndrome due to timeline of 1 week, although retrograde amnesia is present.  Given the concern for Wernicke's, the patient has received thiamine 100 mg IM twice in [facility] (also received a dose in ED).  He is on CIWA protocol with Ativan taper.  He currently does not appear to have any signs or symptoms of mania, but we will follow as his encephalopathy improves.  Additionally he has hypokalemia at 3.0.  He received 1 dose of 40 mEq oral in the ED and we will give him another 40 today and recheck his BMP in the morning.  We will reassess restarting medications for bipolar 1 disorder once the patient is stable with regard to Wernicke encephalopathy.  7/15: Per our assessment today and the patient's mother, the patient continues to have some confusion but has improved attention and short-term memory.  Given this improvement, we will assume that he has Wernicke encephalopathy and thiamine is beneficial and will continue dosing thiamine 100 mg intramuscular 3 times daily (IV thiamine not available at this facility) until he is no longer symptomatic and then switched to p.o thiamine.  Although the patient reports that he uses less alcohol than he previously stated, we will continue him on the Ativan taper.  Furthermore we will restarted him on his home mood stabilizer, abilify 10 once daily.  He expressed interest in being on Abilify Maintena long-acting injectable but our  clinical pharmacist reported that his insurance would pays $0 so it would be very expensive.  Furthermore his hypokalemia has resolved.  No evidence that he is manic (the documented reason he came into the ED).     PLAN: Safety and Monitoring:             -- Involuntary admission to inpatient psychiatric unit for safety, stabilization and treatment             -- Daily contact with patient to assess and evaluate symptoms and progress in treatment             -- Patient's case to be discussed in multi-disciplinary team meeting             -- Observation Level : q15 minute checks             -- Vital signs:  q12 hours             -- Precautions: suicide, elopement, and assault   2. Psychiatric Diagnoses and Treatment:  -- Restart Abilify 10 mg once daily for mood stabilization. --  The risks/benefits/side-effects/alternatives to this medication were discussed in detail with the patient and time was given for questions. The patient consents to medication trial.              -- Metabolic profile and EKG monitoring obtained while on an atypical antipsychotic. See #4 below for values.              -- Encouraged patient to participate in unit milieu and in scheduled group therapies              -- Short Term Goals: Ability to identify changes in lifestyle to reduce recurrence of condition will improve, Ability to verbalize feelings will improve, Ability to disclose and discuss suicidal ideas, Ability to demonstrate self-control will improve, Ability to identify and develop effective coping behaviors will improve, Ability to maintain clinical measurements within normal limits will improve, Compliance with prescribed medications will improve, and Ability to identify triggers associated with substance abuse/mental health issues will improve             --  Long Term Goals: Improvement in symptoms so as ready for discharge                3. Medical Issues Being Addressed:              -- Wernicke's  encephalopathy: Scheduled thiamine 100 mg intramuscular 3 times daily for Warnecke encephalopathy.  Stopped thiamine p.o. for now.             -- Alcohol withdrawal: Continue CIWA protocol with Ativan taper (will complete on Wednesday)                4. Routine and other pertinent labs reviewed:  Potassium 7/15: 4.0  EKG monitoring: QTc: 436  Metabolism / endocrine: BMI: Body mass index is 24.39 kg/m. Prolactin: Recent Labs       Lab Results  Component Value Date    PROLACTIN 16.0 (H) 12/31/2015    PROLACTIN 5.5 12/30/2015      Lipid Panel: Recent Labs       Lab Results  Component Value Date    CHOL 148 10/29/2022    TRIG 80 10/29/2022    HDL 43 10/29/2022    CHOLHDL 3.4 10/29/2022    VLDL 16 10/29/2022    LDLCALC 89 10/29/2022    LDLCALC 144 (H) 12/31/2015      HbgA1c: Last Labs     Hgb A1c MFr Bld (%)  Date Value  12/31/2015 5.5      TSH: Last Labs     TSH (uIU/mL)  Date Value  10/29/2022 0.374  04/15/2012 0.750        Labs to order: Repeat BMP in a.m.   5. Discharge Planning:              -- Social work and case management to assist with discharge planning and identification of hospital follow-up needs prior to discharge             -- Estimated LOS: 7-10 days              -- Discharge Concerns: Need to establish a safety plan; Medication compliance and effectiveness             -- Discharge Goals: Return home with outpatient referrals for mental health follow-up including medication management/psychotherapy     I certify that inpatient services furnished can reasonably be expected to improve the patient's condition.      Meryl Dare, MD 10/30/2022, 2:08 PM

## 2022-10-30 NOTE — Progress Notes (Signed)
   10/30/22 0615  15 Minute Checks  Location Dayroom  Visual Appearance Calm  Behavior Composed  Sleep (Behavioral Health Patients Only)  Calculate sleep? (Click Yes once per 24 hr at 0600 safety check) Yes  Documented sleep last 24 hours 7.75

## 2022-10-30 NOTE — Plan of Care (Signed)
  Problem: Education: Goal: Emotional status will improve Outcome: Progressing Goal: Mental status will improve Outcome: Progressing   Problem: Activity: Goal: Interest or engagement in activities will improve Outcome: Progressing Goal: Sleeping patterns will improve Outcome: Progressing

## 2022-10-30 NOTE — [facility] Group Notes (Signed)
Spiritual care group on grief and loss facilitated by Chaplain Dyanne Carrel, [facility]  Group Goal: Support / Education around grief and loss  Members engage in facilitated group support and psycho-social education.  Group Description:  Following introductions and group rules, group members engaged in facilitated group dialogue and support around topic of loss, with particular support around experiences of loss in their lives. Group Identified types of loss (relationships / self / things) and identified patterns, circumstances, and changes that precipitate losses. Reflected on thoughts / feelings around loss, normalized grief responses, and recognized variety in grief experience. Group encouraged individual reflection on safe space and on the coping skills that they are already utilizing.  Group drew on Adlerian / Rogerian and narrative framework  Patient Progress: [name] attended group and actively engaged and participated in group conversation and activities.  He shared about the loss of his wife.  His comments demonstrated good insight into the topic.

## 2022-10-30 NOTE — [facility] Suicide Risk Assessment (Signed)
[facility] INPATIENT:  Family/Significant Other Suicide Prevention Education  Suicide Prevention Education:  Education Completed; roommate [name] [phone],  (name of family member/significant other) has been identified by the patient as the family member/significant other with whom the patient will be residing, and identified as the person(s) who will aid the patient in the event of a mental health crisis (suicidal ideations/suicide attempt).  With written consent from the patient, the family member/significant other has been provided the following suicide prevention education, prior to the and/or following the discharge of the patient.  Safety planning was completed with roommate. Roommate stated that patient does not listen when he tries to help him out because he does not comprehend certain task. Roommate states that he is handicap and had to clean the mess up that patient made at the house and his landlord is on his back about it. Roommate said that patient can return back home and he will pick him up but will need to know 3-4 hours in advance about pick up time. No guns or weapons are in the home.   The suicide prevention education provided includes the following: Suicide risk factors Suicide prevention and interventions National Suicide Hotline telephone number [facility] assessment telephone number [facility] Emergency Assistance 911 [facility] and/or Residential Mobile Crisis Unit telephone number  Request made of family/significant other to: Remove weapons (e.g., guns, rifles, knives), all items previously/currently identified as safety concern.   Remove drugs/medications (over-the-counter, prescriptions, illicit drugs), all items previously/currently identified as a safety concern.  The family member/significant other verbalizes understanding of the suicide prevention education information provided.  The family member/significant other agrees to remove the items of  safety concern listed above.  [name] 10/30/2022, 3:07 PM

## 2022-10-30 NOTE — BH IP Treatment Plan (Signed)
Interdisciplinary Treatment and Diagnostic Plan Update  10/30/2022 Time of Session: 10:20 AM  [name] MRN: [id number]  Principal Diagnosis: Wernicke encephalopathy  Secondary Diagnoses: Principal Problem:   Wernicke encephalopathy Active Problems:   Bipolar 1 disorder (HCC)   Alcohol use disorder   Current Medications:  Current Facility-Administered Medications  Medication Dose Route Frequency Provider Last Rate Last Admin   acetaminophen (TYLENOL) tablet 650 mg  650 mg Oral Q6H PRN Bobbitt, Shalon E, NP   650 mg at 10/30/22 0624   alum & mag hydroxide-simeth (MAALOX/MYLANTA) 200-200-20 MG/5ML suspension 30 mL  30 mL Oral Q4H PRN Bobbitt, Shalon E, NP       diphenhydrAMINE (BENADRYL) capsule 50 mg  50 mg Oral TID PRN Bobbitt, Shalon E, NP       Or   diphenhydrAMINE (BENADRYL) injection 50 mg  50 mg Intramuscular TID PRN Bobbitt, Shalon E, NP       feeding supplement (ENSURE ENLIVE / ENSURE PLUS) liquid 237 mL  237 mL Oral BID BM Nwoko, Agnes I, NP   237 mL at 10/30/22 1018   haloperidol (HALDOL) tablet 5 mg  5 mg Oral TID PRN Bobbitt, Shalon E, NP       Or   haloperidol lactate (HALDOL) injection 5 mg  5 mg Intramuscular TID PRN Bobbitt, Shalon E, NP       hydrOXYzine (ATARAX) tablet 25 mg  25 mg Oral TID PRN Bobbitt, Shalon E, NP   25 mg at 10/29/22 2056   hydrOXYzine (ATARAX) tablet 25 mg  25 mg Oral Q6H PRN Meryl Dare, MD       loperamide (IMODIUM) capsule 2-4 mg  2-4 mg Oral PRN Meryl Dare, MD       LORazepam (ATIVAN) tablet 2 mg  2 mg Oral TID PRN Bobbitt, Shalon E, NP       Or   LORazepam (ATIVAN) injection 2 mg  2 mg Intramuscular TID PRN Bobbitt, Shalon E, NP   2 mg at 10/29/22 1140   LORazepam (ATIVAN) tablet 1 mg  1 mg Oral Q6H PRN Meryl Dare, MD       LORazepam (ATIVAN) tablet 1 mg  1 mg Oral TID Meryl Dare, MD   1 mg at 10/30/22 1114   Followed by   Melene Muller ON 10/31/2022] LORazepam (ATIVAN) tablet 1 mg  1 mg Oral BID Meryl Dare, MD        Followed by   Melene Muller ON 11/01/2022] LORazepam (ATIVAN) tablet 1 mg  1 mg Oral Daily Meryl Dare, MD       LORazepam (ATIVAN) tablet 2 mg  2 mg Oral Once Attiah, Nadir, MD       magnesium hydroxide (MILK OF MAGNESIA) suspension 30 mL  30 mL Oral Daily PRN Bobbitt, Shalon E, NP       multivitamin with minerals tablet 1 tablet  1 tablet Oral Daily Meryl Dare, MD   1 tablet at 10/30/22 0824   ondansetron (ZOFRAN-ODT) disintegrating tablet 4 mg  4 mg Oral Q6H PRN Meryl Dare, MD       thiamine (VITAMIN B1) injection 100 mg  100 mg Intramuscular TID Abbott Pao, Nadir, MD   100 mg at 10/30/22 1116   Followed by   Melene Muller ON 11/03/2022] thiamine (Vitamin B-1) tablet 100 mg  100 mg Oral Daily Attiah, Nadir, MD       traZODone (DESYREL) tablet 50 mg  50 mg Oral QHS PRN Bobbitt, Shalon E, NP   50 mg at 10/29/22  2056   PTA Medications: Medications Prior to Admission  Medication Sig Dispense Refill Last Dose   hydrOXYzine (VISTARIL) 50 MG capsule Take 50 mg by mouth 2 (two) times daily as needed.      losartan (COZAAR) 25 MG tablet Take 25 mg by mouth daily.      mirtazapine (REMERON) 30 MG tablet Take 30 mg by mouth at bedtime.      traZODone (DESYREL) 100 MG tablet Take 200 mg by mouth at bedtime.      QUEtiapine (SEROQUEL) 300 MG tablet Take 300 mg by mouth at bedtime.       Patient Stressors: Substance abuse    Patient Strengths: Ability for insight  Average or above average intelligence  Supportive family/friends   Treatment Modalities: Medication Management, Group therapy, Case management,  1 to 1 session with clinician, Psychoeducation, Recreational therapy.   Physician Treatment Plan for Primary Diagnosis: Wernicke encephalopathy Long Term Goal(s):     Short Term Goals:    Medication Management: Evaluate patient's response, side effects, and tolerance of medication regimen.  Therapeutic Interventions: 1 to 1 sessions, Unit Group sessions and Medication  administration.  Evaluation of Outcomes: Not Progressing  Physician Treatment Plan for Secondary Diagnosis: Principal Problem:   Wernicke encephalopathy Active Problems:   Bipolar 1 disorder (HCC)   Alcohol use disorder  Long Term Goal(s):     Short Term Goals:       Medication Management: Evaluate patient's response, side effects, and tolerance of medication regimen.  Therapeutic Interventions: 1 to 1 sessions, Unit Group sessions and Medication administration.  Evaluation of Outcomes: Not Progressing   RN Treatment Plan for Primary Diagnosis: Wernicke encephalopathy Long Term Goal(s): Knowledge of disease and therapeutic regimen to maintain health will improve  Short Term Goals: Ability to remain free from injury will improve, Ability to verbalize frustration and anger appropriately will improve, Ability to demonstrate self-control, Ability to participate in decision making will improve, Ability to verbalize feelings will improve, Ability to disclose and discuss suicidal ideas, Ability to identify and develop effective coping behaviors will improve, and Compliance with prescribed medications will improve  Medication Management: RN will administer medications as ordered by provider, will assess and evaluate patient's response and provide education to patient for prescribed medication. RN will report any adverse and/or side effects to prescribing provider.  Therapeutic Interventions: 1 on 1 counseling sessions, Psychoeducation, Medication administration, Evaluate responses to treatment, Monitor vital signs and CBGs as ordered, Perform/monitor CIWA, COWS, AIMS and Fall Risk screenings as ordered, Perform wound care treatments as ordered.  Evaluation of Outcomes: Not Progressing   LCSW Treatment Plan for Primary Diagnosis: Wernicke encephalopathy Long Term Goal(s): Safe transition to appropriate next level of care at discharge, Engage patient in therapeutic group addressing  interpersonal concerns.  Short Term Goals: Engage patient in aftercare planning with referrals and resources, Increase social support, Increase ability to appropriately verbalize feelings, Increase emotional regulation, Facilitate acceptance of mental health diagnosis and concerns, Facilitate patient progression through stages of change regarding substance use diagnoses and concerns, Identify triggers associated with mental health/substance abuse issues, and Increase skills for wellness and recovery  Therapeutic Interventions: Assess for all discharge needs, 1 to 1 time with Social worker, Explore available resources and support systems, Assess for adequacy in community support network, Educate family and significant other(s) on suicide prevention, Complete Psychosocial Assessment, Interpersonal group therapy.  Evaluation of Outcomes: Not Progressing   Progress in Treatment: Attending groups: Yes. Participating in groups: Yes. Taking medication as  prescribed: Yes. Toleration medication: Yes. Family/Significant other contact made: No, will contact:  mother Taedyn Glasscock [phone] and roommate Dian Queen [phone] Patient understands diagnosis: Yes. Discussing patient identified problems/goals with staff: Yes. Medical problems stabilized or resolved: Yes. Denies suicidal/homicidal ideation: Yes. Issues/concerns per patient self-inventory: No.   New problem(s) identified: No, Describe:  None reported   New Short Term/Long Term Goal(s):  medication stabilization, elimination of SI thoughts, development of comprehensive mental wellness plan.    Patient Goals:  " go home , go to groups and get my meals "   Discharge Plan or Barriers: Patient recently admitted. CSW will continue to follow and assess for appropriate referrals and possible discharge planning.    Reason for Continuation of Hospitalization: Anxiety Mania Medication stabilization Suicidal ideation  Estimated Length of  Stay: 4-6 days   Last 3 [place] Suicide Severity Risk Score: Flowsheet Row Ad[facility] (Current) [facility]28/2022 in [facility] at [facility] [place] ED from 10/04/2022 in [facility] Department at [facility]  C-SSRS RISK CATEGORY No Risk Moderate Risk No Risk       Last PHQ 2/9 Scores:     No data to display          Scribe for Treatment Team: Beather Arbour 10/30/2022 11:51 AM

## 2022-10-30 NOTE — Progress Notes (Signed)
Adult Psychoeducational Group Note  Date:  10/30/2022 Time:  10:06 PM  Group Topic/Focus:  Wrap-Up Group:   The focus of this group is to help patients review their daily goal of treatment and discuss progress on daily workbooks.  Participation Level:  Active  Participation Quality:  Appropriate  Affect:  Appropriate  Cognitive:  Appropriate  Insight: Appropriate  Engagement in Group:  Engaged  Modes of Intervention:  Discussion  Additional Comments:  Pt stated his goal for the day was to attend all groups. Pt met goal.  Lucilla Lame 10/30/2022, 10:06 PM

## 2022-10-30 NOTE — Group Note (Signed)
Date:  10/30/2022 Time:  9:47 AM  Group Topic/Focus:  Emotional Education:   The focus of this group is to discuss what feelings/emotions are, and how they are experienced. Goals Group:   The focus of this group is to help patients establish daily goals to achieve during treatment and discuss how the patient can incorporate goal setting into their daily lives to aide in recovery.    Participation Level:  Active  Participation Quality:  Attentive  Affect:  Appropriate  Cognitive:  Appropriate  Insight: Appropriate  Engagement in Group:  Improving  Modes of Intervention:  Discussion  Additional Comments:     Reymundo Poll 10/30/2022, 9:47 AM

## 2022-10-30 NOTE — Progress Notes (Signed)
   10/30/22 2030  Psych Admission Type (Psych Patients Only)  Admission Status Voluntary  Psychosocial Assessment  Patient Complaints Anxiety  Eye Contact Fair  Facial Expression Flat  Affect Appropriate to circumstance  Speech Logical/coherent  Interaction Assertive  Motor Activity Other (Comment)  Appearance/Hygiene In scrubs  Behavior Characteristics Cooperative;Appropriate to situation  Mood Pleasant  Thought Process  Coherency WDL  Content WDL  Delusions None reported or observed  Perception WDL  Hallucination None reported or observed  Judgment Limited  Confusion None  Danger to Self  Current suicidal ideation? Denies  Agreement Not to Harm Self Yes  Description of Agreement Verbal contract  Danger to Others  Danger to Others None reported or observed

## 2022-10-30 NOTE — TOC Benefit Eligibility Note (Addendum)
Pharmacy Patient Advocate Encounter  Insurance verification completed.    The patient is insured through [employer]  Ran test claim for [employer] 400 mg prefilled syringe and Product Not on Formulary  Ran test claim for Aristada 441 mg/1.6 ml prefilled syringe and Product Not on Formulary   This test claim was processed through [employer]- copay amounts may vary at other pharmacies due to [employer], or as the patient moves through the different stages of their insurance plan.    Roland Earl, CPHT Pharmacy Patient Advocate Specialist [facility] Patient Advocate Team Direct Number: [phone]  Fax: [phone]

## 2022-10-30 NOTE — Group Note (Signed)
Recreation Therapy Group Note   Group Topic:Health and Wellness  Group Date: 10/30/2022 Start Time: 0935 End Time: 1016 Facilitators: Tammara Massing-McCall, LRT,CTRS Location: [address] Dayroom   Goal Area(s) Addresses:  Patient will define components of whole wellness. Patient will verbalize benefit of whole wellness.  Group Description: Exercise. LRT and patients discussed the importance of good physical health. LRT explained the activity to patients. Patients were informed they were going to take turns leading the group in the exercises of their choosing. Patients were to complete at least 20-30 minutes of exercise. Patients were encouraged to take breaks and get water as needed.   Affect/Mood: Appropriate   Participation Level: Engaged   Participation Quality: Independent   Behavior: Appropriate   Speech/Thought Process: Focused   Insight: Good   Judgement: Good   Modes of Intervention: Music and Exercise   Patient Response to Interventions:  Engaged   Education Outcome:  Acknowledges education   Clinical Observations/Individualized Feedback: Pt was appropriate and attentive to the exercises. Pt was social and engaged during group session.    Plan: Continue to engage patient in RT group sessions 2-3x/week.   Jarnell Cordaro-McCall, LRT,CTRS 10/30/2022 1:06 PM

## 2022-10-30 NOTE — Progress Notes (Addendum)
Patient denies SI, HI and AVH. Denies any anxiety or depression and CIWA has been 0 this shift. Patient is visible in the milieu and interacting appropriately. Patient's thought is logical and coherent. Patient is pleasant and cooperative. Patient's vital signs have been consistently hypertensive and slightly bradycardic. Patient remains safe with Q 15 min safety checks.   10/30/22 0900  Psych Admission Type (Psych Patients Only)  Admission Status Voluntary  Psychosocial Assessment  Patient Complaints None  Eye Contact Fair  Facial Expression Flat  Affect Appropriate to circumstance  Speech Logical/coherent  Interaction Assertive  Motor Activity Other (Comment) (wnl)  Appearance/Hygiene Unremarkable  Behavior Characteristics Cooperative;Appropriate to situation  Mood Pleasant  Thought Process  Coherency WDL  Content WDL  Delusions None reported or observed  Perception WDL  Hallucination None reported or observed  Judgment Limited  Confusion None  Danger to Self  Current suicidal ideation? Denies  Agreement Not to Harm Self Yes  Description of Agreement verbal  Danger to Others  Danger to Others None reported or observed

## 2022-10-31 NOTE — Plan of Care (Signed)
  Problem: Education: Goal: Mental status will improve Outcome: Progressing   Problem: Health Behavior/Discharge Planning: Goal: Identification of resources available to assist in meeting health care needs will improve Outcome: Progressing   Problem: Physical Regulation: Goal: Ability to maintain clinical measurements within normal limits will improve Outcome: Progressing

## 2022-10-31 NOTE — Plan of Care (Signed)

## 2022-10-31 NOTE — Progress Notes (Signed)
   10/31/22 0549  15 Minute Checks  Location Hallway  Visual Appearance Calm  Behavior Composed  Sleep (Behavioral Health Patients Only)  Calculate sleep? (Click Yes once per 24 hr at 0600 safety check) Yes  Documented sleep last 24 hours 6

## 2022-10-31 NOTE — Progress Notes (Signed)
[facility] MD Progress Note  10/31/2022 1:19 PM [name]  MRN:  [id number] Subjective:   [name] is a 62 year old male with a past psychiatric history of bipolar 1 disorder, alcohol use disorder; numerous hospitalizations in the past (unclear reasons); no self-harm or suicide attempts. He presents from the ED to the behavioral health [facility] for concern for mania.    Case was discussed in the multidisciplinary team. MAR was reviewed and patient is compliant with medications.   Psychiatric Team made the following recommendations yesterday: Scheduled thiamine 100 mg intramuscular three times daily (continued through Wednesday) Continue CIWA protocol with Ativan taper  Patient reports continued improvement including more energy and improved concentration.  He has been coloring pictures for stress reduction while being here.  He reports that his memory is improving and he was able to discuss some events yesterday accurately.  He reports no symptoms of alcohol withdrawal including nausea, vomiting, tremors, diaphoresis.  He reports no issues with his balance.  He reports no increased activity or elevated mood or irritability.  He reported that he slept well from 11 PM to 8 AM.  He reports good appetite and that they provide a lot of food here.  He reported that he spoke to his mom yesterday and his friend came to see him during visitation hours.  Collateral [name], mother, [phone]):  Collateral Luther Parody, manager at work, [phone]): I spoke to the patient supervisor and let her know that the patient is currently hospitalized (without providing any other information).  I notified her that the patient would be back to work on Monday.  Collateral Onalee Hua, friend, [phone]): He reports that he visit the patient patient briefly last night.  He was unable to assess where the patient was at his baseline and.  He said that he would visit the patient again tonight and give a report on the  patient's mental status tomorrow.  Furthermore he said he would be the one to pick the patient up at discharge.   Principal Problem: Wernicke encephalopathy Diagnosis: Principal Problem:   Wernicke encephalopathy Active Problems:   Bipolar 1 disorder (HCC)   Alcohol use disorder  Total Time spent with patient: 15 minutes  Past Psychiatric History: bipolar 1 disorder, alcohol use disorder; numerous hospitalizations in the past (unclear reasons); no self-harm or suicide attempts   Past Medical History:  Past Medical History:  Diagnosis Date   Bipolar 1 disorder (HCC)     Past Surgical History:  Procedure Laterality Date   EYE SURGERY     Family History:  Family History  Problem Relation Age of Onset   Hyperlipidemia Mother    Alcoholism Mother    Suicidality Cousin    Family Psychiatric  History: Patient reports no family history. Social History:  Social History   Substance and Sexual Activity  Alcohol Use Yes   Comment: weekly     Social History   Substance and Sexual Activity  Drug Use Not Currently    Social History   Socioeconomic History   Marital status: Single    Spouse name: Not on file   Number of children: Not on file   Years of education: Not on file   Highest education level: Not on file  Occupational History   Not on file  Tobacco Use   Smoking status: Never   Smokeless tobacco: Never  Vaping Use   Vaping status: Never Used  Substance and Sexual Activity   Alcohol use: Yes    Comment: weekly  Drug use: Not Currently   Sexual activity: Not on file  Other Topics Concern   Not on file  Social History Narrative   Not on file   Social Determinants of Health   Financial Resource Strain: Not on file  Food Insecurity: No Food Insecurity (10/29/2022)   Hunger Vital Sign    Worried About Running Out of Food in the Last Year: Never true    Ran Out of Food in the Last Year: Never true  Transportation Needs: No Transportation Needs (10/29/2022)    PRAPARE - Administrator, Civil Service (Medical): No    Lack of Transportation (Non-Medical): No  Physical Activity: Not on file  Stress: Not on file  Social Connections: Not on file   Additional Social History:   Lives with his mother and has a good relationship with her.  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     Sleep: Good slept from 11-8 totaling 9 hours  Appetite:  Good  Current Medications: Current Facility-Administered Medications  Medication Dose Route Frequency Provider Last Rate Last Admin   acetaminophen (TYLENOL) tablet 650 mg  650 mg Oral Q6H PRN Bobbitt, Shalon E, NP   650 mg at 10/31/22 0743   alum & mag hydroxide-simeth (MAALOX/MYLANTA) 200-200-20 MG/5ML suspension 30 mL  30 mL Oral Q4H PRN Bobbitt, Shalon E, NP       ARIPiprazole (ABILIFY) tablet 10 mg  10 mg Oral Daily Meryl Dare, MD   10 mg at 10/31/22 0741   diphenhydrAMINE (BENADRYL) capsule 50 mg  50 mg Oral TID PRN Bobbitt, Shalon E, NP       Or   diphenhydrAMINE (BENADRYL) injection 50 mg  50 mg Intramuscular TID PRN Bobbitt, Shalon E, NP       feeding supplement (ENSURE ENLIVE / ENSURE PLUS) liquid 237 mL  237 mL Oral BID BM Nwoko, Agnes I, NP   237 mL at 10/30/22 1403   haloperidol (HALDOL) tablet 5 mg  5 mg Oral TID PRN Bobbitt, Shalon E, NP       Or   haloperidol lactate (HALDOL) injection 5 mg  5 mg Intramuscular TID PRN Bobbitt, Shalon E, NP       hydrOXYzine (ATARAX) tablet 25 mg  25 mg Oral TID PRN Bobbitt, Shalon E, NP   25 mg at 10/30/22 2058   hydrOXYzine (ATARAX) tablet 25 mg  25 mg Oral Q6H PRN Meryl Dare, MD       loperamide (IMODIUM) capsule 2-4 mg  2-4 mg Oral PRN Meryl Dare, MD       LORazepam (ATIVAN) tablet 2 mg  2 mg Oral TID PRN Bobbitt, Shalon E, NP       Or   LORazepam (ATIVAN) injection 2 mg  2 mg Intramuscular TID PRN Bobbitt, Shalon E, NP   2 mg at 10/29/22 1140   LORazepam (ATIVAN) tablet 1 mg  1 mg Oral Q6H PRN Meryl Dare, MD       LORazepam (ATIVAN) tablet 1 mg  1 mg Oral BID Meryl Dare, MD   1 mg at 10/31/22 0741   Followed by   Melene Muller ON 11/01/2022] LORazepam (ATIVAN) tablet 1 mg  1 mg Oral Daily Meryl Dare, MD       magnesium hydroxide (MILK OF MAGNESIA) suspension 30 mL  30 mL Oral Daily PRN Bobbitt, Shalon E, NP       multivitamin with minerals tablet 1 tablet  1 tablet Oral Daily Gilman Buttner,  Lizania Bouchard, MD   1 tablet at 10/31/22 0741   ondansetron (ZOFRAN-ODT) disintegrating tablet 4 mg  4 mg Oral Q6H PRN Meryl Dare, MD       thiamine (VITAMIN B1) injection 100 mg  100 mg Intramuscular TID Starleen Blue, NP   100 mg at 10/31/22 0742   traZODone (DESYREL) tablet 50 mg  50 mg Oral QHS PRN Bobbitt, Shalon E, NP   50 mg at 10/30/22 2058    Lab Results:  Results for orders placed or performed during the hospital encounter of 10/29/22 (from the past 48 hour(s))  Basic metabolic panel     Status: Abnormal   Collection Time: 10/30/22  6:26 AM  Result Value Ref Range   Sodium 137 135 - 145 mmol/L   Potassium 4.0 3.5 - 5.1 mmol/L   Chloride 108 98 - 111 mmol/L   CO2 20 (L) 22 - 32 mmol/L   Glucose, Bld 136 (H) 70 - 99 mg/dL    Comment: Glucose reference range applies only to samples taken after fasting for at least 8 hours.   BUN 16 6 - 20 mg/dL   Creatinine, Ser 9.52 (H) 0.61 - 1.24 mg/dL   Calcium 9.6 8.9 - 84.1 mg/dL   GFR, Estimated >32 >44 mL/min    Comment: (NOTE) Calculated using the CKD-EPI Creatinine Equation (2021)    Anion gap 9 5 - 15    Comment: Performed at [facility], [address], [place], [place] [address]    Blood Alcohol level:  Lab Results  Component Value Date   ETH <10 10/28/2022   ETH <10 09/21/2017    Metabolic Disorder Labs: Lab Results  Component Value Date   HGBA1C 5.5 12/31/2015   MPG 111 12/31/2015   MPG 114 12/30/2015   Lab Results  Component Value Date   PROLACTIN 16.0 (H) 12/31/2015   PROLACTIN 5.5 12/30/2015   Lab  Results  Component Value Date   CHOL 148 10/29/2022   TRIG 80 10/29/2022   HDL 43 10/29/2022   CHOLHDL 3.4 10/29/2022   VLDL 16 10/29/2022   LDLCALC 89 10/29/2022   LDLCALC 144 (H) 12/31/2015    Physical Findings: CIWA:  CIWA-Ar Total: 1   Musculoskeletal: Strength & Muscle Tone: within normal limits Gait & Station: normal Patient leans: N/A  Psychiatric Specialty Exam:  Presentation  General Appearance:  Appropriate for Environment  Eye Contact: Good  Speech: -- (increased rate)  Speech Volume: Normal  Handedness:No data recorded  Mood and Affect  Mood: Euthymic  Affect: Congruent   Thought Process  Thought Processes: Linear  Descriptions of Associations:Intact  Orientation:Full (Time, Place and Person)  Thought Content:Logical  History of Schizophrenia/Schizoaffective disorder:No  Duration of Psychotic Symptoms:N/A  Hallucinations:Hallucinations: None  Ideas of Reference:None  Suicidal Thoughts:Suicidal Thoughts: No  Homicidal Thoughts:Homicidal Thoughts: No   Sensorium  Memory: Immediate Good; Recent Good; Remote Good  Judgment: Fair  Insight: Fair   Art therapist  Concentration: Good  Attention Span: Good (5 serial numbers in 1 min)  Recall: Good  Fund of Knowledge: Good  Language: Good   Psychomotor Activity  Psychomotor Activity: Psychomotor Activity: Normal   Assets  Assets: Communication Skills; Housing; Health and safety inspector; Social Support; Vocational/Educational   Sleep  Sleep: Sleep: Good Number of Hours of Sleep: 9    Physical Exam: Physical Exam Vitals and nursing note reviewed.  HENT:     Head: Normocephalic and atraumatic.  Pulmonary:     Effort: Pulmonary effort is normal.  Neurological:     General: No focal deficit present.     Mental Status: He is alert.  Psychiatric:        Behavior: Behavior is cooperative.        Thought Content: Thought content normal.      Comments: Continued mild confusion.  Improved attention compared to yesterday as evidenced by remembering 5 serial numbers in 1 minute versus 0 numbers yesterday.    Review of Systems  Constitutional:  Negative for fever.  Cardiovascular:  Negative for chest pain and palpitations.  Gastrointestinal:  Negative for constipation, diarrhea, nausea and vomiting.  Neurological:  Negative for dizziness, weakness and headaches.  Psychiatric/Behavioral:  Negative for depression, hallucinations, memory loss and suicidal ideas. The patient does not have insomnia.    Blood pressure 118/84, pulse 85, temperature 98.2 F (36.8 C), temperature source Oral, resp. rate 16, height 5\' 10"  (1.778 m), weight 77.1 kg, SpO2 97%. Body mass index is 24.39 kg/m.   ASSESSMENT & PLAN   ASSESSMENT:   Diagnoses / Active Problems: Wernicke's encephalopathy versus alcohol withdrawal with delirium tremens Alcohol use disorder Bipolar 1 disorder Rule out major neurocognitive disorder   Patient presents with problems of 1 week history of memory loss, confusion, inattention, subjective balance issues per patient; on exam, intermittent repetitive mouth movements; 12 beers a day alcohol use;  medication non adherence.  These problems raise concern for Warnecke's encephalopathy but may also be related to alcohol withdrawal, although alcohol withdrawal signs/symptoms are not present (although he is on Ativan taper).  Unlikely Korsakoff syndrome due to timeline of 1 week, although retrograde amnesia is present.  Given the concern for Wernicke's, the patient has received thiamine 100 mg IM twice in [facility] (also received a dose in ED).  He is on CIWA protocol with Ativan taper.  He currently does not appear to have any signs or symptoms of mania, but we will follow as his encephalopathy improves.  Additionally he has hypokalemia at 3.0.  He received 1 dose of 40 mEq oral in the ED and we will give him another 40 today and recheck his  BMP in the morning.  We will reassess restarting medications for bipolar 1 disorder once the patient is stable with regard to Wernicke encephalopathy.  7/15: Per our assessment today and the patient's mother, the patient continues to have some confusion but has improved attention and short-term memory.  Given this improvement, we will assume that he has Wernicke encephalopathy and thiamine is beneficial and will continue dosing thiamine 100 mg intramuscular 3 times daily (IV thiamine not available at this facility) until he is no longer symptomatic and then switched to p.o thiamine.  Although the patient reports that he uses less alcohol than he previously stated, we will continue him on the Ativan taper.  Furthermore we will restarted him on his home mood stabilizer, abilify 10 once daily.  He expressed interest in being on Abilify Maintena long-acting injectable but our clinical pharmacist reported that his insurance would pays $0 so it would be very expensive.  Furthermore his hypokalemia has resolved.  No evidence that he is manic (the documented reason he came into the ED).  7/16: Patient appears to be back to his baseline mental status.  This is confirmed by his mother and will be confirmed by his friend (who is visiting him tonight) tomorrow.  He will finish his high dose thiamine tomorrow and if nothing changes can be discharged on Thursday.  Continues to have low CIWA  scores.  Ativan taper finishes tomorrow.  No signs or symptoms of mania and is adherent with his Abilify 10 mg once daily without side effects.     PLAN: Safety and Monitoring:             -- Involuntary admission to inpatient psychiatric unit for safety, stabilization and treatment             -- Daily contact with patient to assess and evaluate symptoms and progress in treatment             -- Patient's case to be discussed in multi-disciplinary team meeting             -- Observation Level : q15 minute checks             --  Vital signs:  q12 hours             -- Precautions: suicide, elopement, and assault   2. Psychiatric Diagnoses and Treatment:  --Continue Abilify 10 mg once daily for mood stabilization. --  The risks/benefits/side-effects/alternatives to this medication were discussed in detail with the patient and time was given for questions. The patient consents to medication trial.              -- Metabolic profile and EKG monitoring obtained while on an atypical antipsychotic. See #4 below for values.              -- Encouraged patient to participate in unit milieu and in scheduled group therapies              -- Short Term Goals: Ability to identify changes in lifestyle to reduce recurrence of condition will improve, Ability to verbalize feelings will improve, Ability to disclose and discuss suicidal ideas, Ability to demonstrate self-control will improve, Ability to identify and develop effective coping behaviors will improve, Ability to maintain clinical measurements within normal limits will improve, Compliance with prescribed medications will improve, and Ability to identify triggers associated with substance abuse/mental health issues will improve             -- Long Term Goals: Improvement in symptoms so as ready for discharge                3. Medical Issues Being Addressed:              -- Wernicke's encephalopathy: Continue thiamine 100 mg intramuscular 3 times daily for Warnecke encephalopathy.  Restart thiamine p.o. on Thursday following high-dose IM thiamine.             -- Alcohol withdrawal: Continue CIWA protocol with Ativan taper (will complete tomorrow).                 4. Routine and other pertinent labs reviewed:  Potassium 7/15: 4.0  EKG monitoring: QTc: 436  Metabolism / endocrine: BMI: Body mass index is 24.39 kg/m. Prolactin: Recent Labs       Lab Results  Component Value Date    PROLACTIN 16.0 (H) 12/31/2015    PROLACTIN 5.5 12/30/2015      Lipid Panel: Recent Labs        Lab Results  Component Value Date    CHOL 148 10/29/2022    TRIG 80 10/29/2022    HDL 43 10/29/2022    CHOLHDL 3.4 10/29/2022    VLDL 16 10/29/2022    LDLCALC 89 10/29/2022    LDLCALC 144 (H) 12/31/2015      HbgA1c: Last Labs  Hgb A1c MFr Bld (%)  Date Value  12/31/2015 5.5      TSH: Last Labs     TSH (uIU/mL)  Date Value  10/29/2022 0.374  04/15/2012 0.750        Labs to order: None  5. Discharge Planning:              -- Social work and case management to assist with discharge planning and identification of hospital follow-up needs prior to discharge             -- Estimated LOS: 7-10 days              -- Discharge Concerns: Need to establish a safety plan; Medication compliance and effectiveness             -- Discharge Goals: Return home with outpatient referrals for mental health follow-up including medication management/psychotherapy     I certify that inpatient services furnished can reasonably be expected to improve the patient's condition.      Meryl Dare, MD 10/31/2022, 1:19 PM

## 2022-10-31 NOTE — Progress Notes (Signed)
D: Patient alert and oriented, able to make needs known. Denies SI/HI, AVH at present. Denies pain at present. Patient goal today "go to all groups, meet with mom and friend at visitation." Rates depression 0/10, hopelessness 0/10, and anxiety 1/10. Patient reports energy level as normal. He reports he slept good last night. Patient does not request any PRN medication at this time.   A: Scheduled medications administered to patient per MD order. Support and encouragement provided. Routine safety checks conducted every fifteen minutes. Patient informed to notify staff with problems or concerns. Frequent verbal contact made.   R: No adverse drug reactions noted. Patient contracts for safety at this time. Patient is compliant with medications and treatment plan. Patient receptive, calm and cooperative. Patient interacts with others appropriately on unit at present. Patient remains safe at present.

## 2022-10-31 NOTE — Group Note (Signed)
Date:  10/31/2022 Time:  4:34 PM  Group Topic/Focus:  Wellness Toolbox:   The focus of this group is to discuss various aspects of wellness, balancing those aspects and exploring ways to increase the ability to experience wellness.  Patients will create a wellness toolbox for use upon discharge.    Participation Level:  Minimal  Participation Quality:  Attentive  Affect:  Appropriate  Cognitive:  Lacking  Insight: Limited  Engagement in Group:  Limited  Modes of Intervention:  Education  Additional Comments:     Reymundo Poll 10/31/2022, 4:34 PM

## 2022-10-31 NOTE — [facility] Group Notes (Signed)
Adult Psychoeducational Group Note  Date:  10/31/2022 Time:  11:40 PM  Group Topic/Focus:  Wrap-Up Group:   The focus of this group is to help patients review their daily goal of treatment and discuss progress on daily workbooks.  Participation Level:  Active  Participation Quality:  Appropriate  Affect:  Appropriate  Cognitive:  Appropriate  Insight: Appropriate  Engagement in Group:  Engaged  Modes of Intervention:  Discussion  Additional Comments:  Pt stated day was 10 out of 10, also stated today goal was to attend all groups and it was achieved.  Joselyn Arrow 10/31/2022, 11:40 PM

## 2022-10-31 NOTE — Group Note (Signed)
Date:  10/31/2022 Time:  12:23 PM  LCSW Group Therapy Note   Group Date: @GROUPDATE @ Start Time: @GROUPSTARTTIME @ End Time: @GROUPENDTIME @   Type of Therapy and Topic:  Group Therapy: Boundaries  Participation Level:    Description of Group: This group will address the use of boundaries in their personal lives. Patients will explore why boundaries are important, the difference between healthy and unhealthy boundaries, and negative and postive outcomes of different boundaries and will look at how boundaries can be crossed.  Patients will be encouraged to identify current boundaries in their own lives and identify what kind of boundary is being set. Facilitators will guide patients in utilizing problem-solving interventions to address and correct types boundaries being used and to address when no boundary is being used. Understanding and applying boundaries will be explored and addressed for obtaining and maintaining a balanced life. Patients will be encouraged to explore ways to assertively make their boundaries and needs known to significant others in their lives, using other group members and facilitator for role play, support, and feedback.  Therapeutic Goals:  1.  Patient will identify areas in their life where setting clear boundaries could be  used to improve their life.  2.  Patient will identify signs/triggers that a boundary is not being respected. 3.  Patient will identify two ways to set boundaries in order to achieve balance in  their lives: 4.  Patient will demonstrate ability to communicate their needs and set boundaries  through discussion and/or role plays  Summary of Patient Progress:  Patient was active throughout the session and proved open to feedback from CSW and peers. Patient demonstrated insight into the subject matter, was respectful of peers, and was present throughout the entire session.  Therapeutic Modalities:   Cognitive Behavioral Therapy Solution-Focused  Therapy  Memory Dance Hydeia Mcatee, LCSWA 10/31/2022  12:02 PM   Group Topic/Focus:  Emotional Education:   The focus of this group is to discuss what feelings/emotions are, and how they are experienced. Self Care:   The focus of this group is to help patients understand the importance of self-care in order to improve or restore emotional, physical, spiritual, interpersonal, and financial health.    Participation Level:  Minimal  Participation Quality:  Appropriate  Affect:  Appropriate  Cognitive:  Appropriate  Insight: Appropriate  Engagement in Group:  Limited  Modes of Intervention:  Discussion, Rapport Building, Socialization, and Support  Additional Comments:    Memory Dance Zelene Barga 10/31/2022, 12:23 PM

## 2022-10-31 NOTE — Group Note (Signed)
Recreation Therapy Group Note   Group Topic:Animal Assisted Therapy   Group Date: 10/31/2022 Start Time: 1008 End Time: 1042 Facilitators: Jelene Albano-McCall, LRT,CTRS Location: [address] Dayroom   Animal-Assisted Activity (AAA) Program Checklist/Progress Notes Patient Eligibility Criteria Checklist & Daily Group note for Rec Tx Intervention  AAA/T Program Assumption of Risk Form signed by Patient/ or Parent Legal Guardian Yes  Patient understands his/her participation is voluntary Yes   Affect/Mood: N/A   Participation Level: Did not attend    Clinical Observations/Individualized Feedback:     Plan: Continue to engage patient in RT group sessions 2-3x/week.   [name], LRT,CTRS 10/31/2022 1:05 PM

## 2022-11-01 DIAGNOSIS — F319 Bipolar disorder, unspecified: Secondary | ICD-10-CM | POA: Diagnosis not present

## 2022-11-01 MED ORDER — METOPROLOL SUCCINATE ER 25 MG PO TB24
25.0000 mg | ORAL_TABLET | Freq: Every day | ORAL | Status: DC
  Administered 2022-11-01 – 2022-11-02 (×2): 25 mg via ORAL
  Filled 2022-11-01 (×4): qty 1

## 2022-11-01 NOTE — [facility] Group Notes (Signed)
Spiritual care group facilitated by Chaplain Dyanne Carrel, [facility]  Group focused on topic of strength. Group members reflected on what thoughts and feelings emerge when they hear this topic. They then engaged in facilitated dialog around how strength is present in their lives. This dialog focused on representing what strength had been to them in their lives (images and patterns given) and what they saw as helpful in their life now (what they needed / wanted).  Activity drew on narrative framework.  Patient Progress: [name] attended group and actively engaged in group conversation and activities. He shared about drawing strength from his family and friends.  He needs strength now in order to get back to work.

## 2022-11-01 NOTE — Plan of Care (Signed)

## 2022-11-01 NOTE — Group Note (Signed)
Recreation Therapy Group Note   Group Topic:Team Building  Group Date: 11/01/2022 Start Time: 0935 End Time: 1000 Facilitators: Lillyth Spong-McCall, LRT,CTRS Location: [address] Dayroom   Goal Area(s) Addresses:  Patient will effectively work with peer towards shared goal.  Patient will identify skills used to make activity successful.  Patient will share challenges and verbalize solution-driven approaches used. Patient will identify how skills used during activity can be used to reach post d/c goals.    Group Description: [employer]. Patients were provided the following materials: 4 drinking straws, 5 rubber bands, 5 paper clips, 2 index cards and 2 drinking cups. Using the provided materials patients were asked to build a launching mechanism to launch a ping pong ball across the room, approximately 10 feet. Patients were divided into teams of 3-5. Instructions required all materials be incorporated into the device, functionality of items left to the peer group's discretion.   Affect/Mood: N/A   Participation Level: Did not attend    Clinical Observations/Individualized Feedback:     Plan: Continue to engage patient in RT group sessions 2-3x/week.   Brynn Reznik-McCall, LRT,CTRS 11/01/2022 12:45 PM

## 2022-11-01 NOTE — [facility] Group Notes (Signed)
Adult Psychoeducational Group Note  Date:  11/01/2022 Time:  10:52 AM  Group Topic/Focus:  Emotional Education:   The focus of this group is to discuss what feelings/emotions are, and how they are experienced. Goals Group:   The focus of this group is to help patients establish daily goals to achieve during treatment and discuss how the patient can incorporate goal setting into their daily lives to aide in recovery.  Participation Level:  Active  Participation Quality:  Appropriate  Affect:  Appropriate  Cognitive:  Appropriate  Insight: Appropriate  Engagement in Group:  Engaged  Modes of Intervention:  Education and Exploration  Additional Comments:  Pt participated in group today. Pt stated his goal is to attend meetings and have a good visit with his mom later today. Facilitator educated the group on the Four Cues emotional, behavioral, physical and cognitive engaging the group in identifying their cues. Facilitator read a spiritual quote for today.     Lysette Lindenbaum 11/01/2022, 10:52 AM

## 2022-11-01 NOTE — Progress Notes (Signed)
   11/01/22 0753  Psych Admission Type (Psych Patients Only)  Admission Status Voluntary  Psychosocial Assessment  Patient Complaints None  Eye Contact Fair  Facial Expression Flat  Affect Flat  Speech Logical/coherent  Interaction Assertive  Motor Activity Other (Comment) (WDL)  Appearance/Hygiene Disheveled  Behavior Characteristics Cooperative  Mood Pleasant  Thought Process  Coherency WDL  Content WDL  Delusions None reported or observed  Perception WDL  Hallucination None reported or observed  Judgment Poor  Confusion None  Danger to Self  Current suicidal ideation? Denies  Agreement Not to Harm Self Yes  Description of Agreement verbal  Danger to Others  Danger to Others None reported or observed

## 2022-11-01 NOTE — [facility] Group Notes (Signed)
[facility] Group Notes:  (Nursing/MHT/Case Management/Adjunct)  Date:  11/01/2022  Time:  11:56 AM  Type of Therapy:  Psychoeducational Skills  Participation Level:  Active  Participation Quality:  Appropriate  Affect:  Appropriate  Cognitive:  Appropriate  Insight:  Appropriate  Engagement in Group:  Engaged  Modes of Intervention:  Discussion, Education, and Exploration  Summary of Progress/Problems: Patients were given education on mindfulness and meditation practices to help with mental health wellness and anxiety reduction. Patients were also given poem '' There's a hole in my sidewalk '' and asked to explore behavioral patterns that are contributing to mental health distress. Pt shared and participated appropriately.  Malva Limes 11/01/2022, 11:56 AM

## 2022-11-01 NOTE — Progress Notes (Signed)
[facility] MD Progress Note  11/01/2022 10:46 AM [name]  MRN:  [id number] Subjective:   [name] is a 62 year old male with a past psychiatric history of bipolar 1 disorder, alcohol use disorder; numerous hospitalizations in the past (unclear reasons); no self-harm or suicide attempts. He presents from the ED to the behavioral health Hospital for concern for mania.   Case was discussed in the multidisciplinary team. MAR was reviewed and patient is compliant with medications.  Psychiatric Team made the following recommendations yesterday: Continue thiamine 100 mg intramuscular three times daily (continued through Wednesday) Continue CIWA protocol with Ativan taper  Patient reports continued improvement today.  He reported that he had trouble sleeping last night Sc[employer] roommate who was snoring placed on the 4 hours.  His appetite has been good.  He said that his mom visited yesterday.  He denies SI, HI, AVH.  He denies elevated, expansive or irritated mood.  He denies increase in his activity.  He denies racing thoughts.  He reports no symptoms of alcohol withdrawal including tremors, diaphoresis, nausea, vomiting.  He reports no balance issues today.  He reports no issues of memory or confusion.  Today he reported that he has 2 other medications that we do not currently have him on.  Seroquel 100 mg once at night for sleep and metoprolol (unclear what dosage) for hypertension.  Collateral [name], mother, [phone]): [name] confirmed that she visit the patient last night.  She says he is at his baseline and "back to his usual self."  She said that he was answering questions normally and was no longer experiencing the confusion and memory issues prior to hospitalization.  She agreed to the pick at the patient tomorrow and monitor him and let him stay with her for the next month.  Collateral (patient's psychiatrist, family services [address] in [place], [phone]): Called the  office and was transferred to the patient's personal psychiatrist's office number.  She did not answer and I left a voicemail detailing the patient's presentation, hospital course, restart medications, recommendations.  I left her my phone number to call back for more details.  Principal Problem: Wernicke encephalopathy Diagnosis: Principal Problem:   Wernicke encephalopathy Active Problems:   Bipolar 1 disorder (HCC)   Alcohol use disorder  Total Time spent with patient: 15 minutes  Past Psychiatric History: bipolar 1 disorder, alcohol use disorder; numerous hospitalizations in the past (unclear reasons); no self-harm or suicide attempts   Past Medical History:  Past Medical History:  Diagnosis Date   Bipolar 1 disorder (HCC)     Past Surgical History:  Procedure Laterality Date   EYE SURGERY     Family History:  Family History  Problem Relation Age of Onset   Hyperlipidemia Mother    Alcoholism Mother    Suicidality Cousin    Family Psychiatric  History: Patient reports no family history. Social History:  Social History   Substance and Sexual Activity  Alcohol Use Yes   Comment: weekly     Social History   Substance and Sexual Activity  Drug Use Not Currently    Social History   Socioeconomic History   Marital status: Single    Spouse name: Not on file   Number of children: Not on file   Years of education: Not on file   Highest education level: Not on file  Occupational History   Not on file  Tobacco Use   Smoking status: Never   Smokeless tobacco: Never  Vaping Use  Vaping status: Never Used  Substance and Sexual Activity   Alcohol use: Yes    Comment: weekly   Drug use: Not Currently   Sexual activity: Not on file  Other Topics Concern   Not on file  Social History Narrative   Not on file   Social Determinants of Health   Financial Resource Strain: Not on file  Food Insecurity: No Food Insecurity (10/29/2022)   Hunger Vital Sign    Worried  About Running Out of Food in the Last Year: Never true    Ran Out of Food in the Last Year: Never true  Transportation Needs: No Transportation Needs (10/29/2022)   PRAPARE - Administrator, Civil Service (Medical): No    Lack of Transportation (Non-Medical): No  Physical Activity: Not on file  Stress: Not on file  Social Connections: Not on file   Additional Social History:   Lives with his mother and has a good relationship with her.  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     Sleep: Good slept from 11-8 totaling 9 hours  Appetite:  Good  Current Medications: Current Facility-Administered Medications  Medication Dose Route Frequency Provider Last Rate Last Admin   acetaminophen (TYLENOL) tablet 650 mg  650 mg Oral Q6H PRN Bobbitt, Shalon E, NP   650 mg at 10/31/22 0743   alum & mag hydroxide-simeth (MAALOX/MYLANTA) 200-200-20 MG/5ML suspension 30 mL  30 mL Oral Q4H PRN Bobbitt, Shalon E, NP       ARIPiprazole (ABILIFY) tablet 10 mg  10 mg Oral Daily Meryl Dare, MD   10 mg at 11/01/22 0750   diphenhydrAMINE (BENADRYL) capsule 50 mg  50 mg Oral TID PRN Bobbitt, Shalon E, NP       Or   diphenhydrAMINE (BENADRYL) injection 50 mg  50 mg Intramuscular TID PRN Bobbitt, Shalon E, NP       feeding supplement (ENSURE ENLIVE / ENSURE PLUS) liquid 237 mL  237 mL Oral BID BM Nwoko, Agnes I, NP   237 mL at 11/01/22 0954   haloperidol (HALDOL) tablet 5 mg  5 mg Oral TID PRN Bobbitt, Shalon E, NP       Or   haloperidol lactate (HALDOL) injection 5 mg  5 mg Intramuscular TID PRN Bobbitt, Shalon E, NP       hydrOXYzine (ATARAX) tablet 25 mg  25 mg Oral TID PRN Bobbitt, Shalon E, NP   25 mg at 11/01/22 9528   hydrOXYzine (ATARAX) tablet 25 mg  25 mg Oral Q6H PRN Meryl Dare, MD   25 mg at 10/31/22 2034   loperamide (IMODIUM) capsule 2-4 mg  2-4 mg Oral PRN Meryl Dare, MD       LORazepam (ATIVAN) tablet 2 mg  2 mg Oral TID PRN Bobbitt, Shalon E, NP        Or   LORazepam (ATIVAN) injection 2 mg  2 mg Intramuscular TID PRN Bobbitt, Shalon E, NP   2 mg at 10/29/22 1140   LORazepam (ATIVAN) tablet 1 mg  1 mg Oral Q6H PRN Meryl Dare, MD       magnesium hydroxide (MILK OF MAGNESIA) suspension 30 mL  30 mL Oral Daily PRN Bobbitt, Shalon E, NP       multivitamin with minerals tablet 1 tablet  1 tablet Oral Daily Meryl Dare, MD   1 tablet at 11/01/22 0750   ondansetron (ZOFRAN-ODT) disintegrating tablet 4 mg  4 mg Oral Q6H PRN Gilman Buttner,  Ralynn San, MD       thiamine (VITAMIN B1) injection 100 mg  100 mg Intramuscular TID Starleen Blue, NP   100 mg at 11/01/22 0753   traZODone (DESYREL) tablet 50 mg  50 mg Oral QHS PRN Bobbitt, Shalon E, NP   50 mg at 10/30/22 2058    Lab Results:  No results found for this or any previous visit (from the past 48 hour(s)).   Blood Alcohol level:  Lab Results  Component Value Date   ETH <10 10/28/2022   ETH <10 09/21/2017    Metabolic Disorder Labs: Lab Results  Component Value Date   HGBA1C 5.5 12/31/2015   MPG 111 12/31/2015   MPG 114 12/30/2015   Lab Results  Component Value Date   PROLACTIN 16.0 (H) 12/31/2015   PROLACTIN 5.5 12/30/2015   Lab Results  Component Value Date   CHOL 148 10/29/2022   TRIG 80 10/29/2022   HDL 43 10/29/2022   CHOLHDL 3.4 10/29/2022   VLDL 16 10/29/2022   LDLCALC 89 10/29/2022   LDLCALC 144 (H) 12/31/2015    Physical Findings: CIWA:  CIWA-Ar Total: 1   Musculoskeletal: Strength & Muscle Tone: within normal limits Gait & Station: normal Patient leans: N/A  Psychiatric Specialty Exam:  Presentation  General Appearance:  Appropriate for Environment  Eye Contact: Good  Speech: Clear and Coherent  Speech Volume: Normal  Handedness:No data recorded  Mood and Affect  Mood: Euthymic  Affect: Congruent   Thought Process  Thought Processes: Coherent  Descriptions of Associations:Intact  Orientation:Full (Time, Place and  Person)  Thought Content:Logical  History of Schizophrenia/Schizoaffective disorder:No  Duration of Psychotic Symptoms:N/A  Hallucinations:Hallucinations: None  Ideas of Reference:None  Suicidal Thoughts:Suicidal Thoughts: No  Homicidal Thoughts:Homicidal Thoughts: No   Sensorium  Memory: Immediate Good; Recent Good; Remote Good  Judgment: Good  Insight: Good   Executive Functions  Concentration: Good  Attention Span: Good  Recall: Good  Fund of Knowledge: Good  Language: Good   Psychomotor Activity  Psychomotor Activity: Psychomotor Activity: Normal   Assets  Assets: Housing; Desire for Improvement; Financial Resources/Insurance; Intimacy; Social Support; Transportation   Sleep  Sleep: Sleep: Good Number of Hours of Sleep: 5 (New roommate who is snoring.)    Physical Exam: Physical Exam Vitals and nursing note reviewed.  HENT:     Head: Normocephalic and atraumatic.  Pulmonary:     Effort: Pulmonary effort is normal.  Neurological:     General: No focal deficit present.     Mental Status: He is alert.  Psychiatric:        Behavior: Behavior is cooperative.        Thought Content: Thought content normal.     Comments: Continued mild confusion.  Improved attention compared to yesterday as evidenced by remembering 5 serial numbers in 1 minute versus 0 numbers yesterday.    Review of Systems  Constitutional:  Negative for fever.  Cardiovascular:  Negative for chest pain and palpitations.  Gastrointestinal:  Negative for constipation, diarrhea, nausea and vomiting.  Neurological:  Negative for dizziness, weakness and headaches.  Psychiatric/Behavioral:  Negative for depression, hallucinations, memory loss and suicidal ideas. The patient does not have insomnia.    Blood pressure (!) 147/94, pulse 76, temperature 98.5 F (36.9 C), temperature source Oral, resp. rate 18, height 5\' 10"  (1.778 m), weight 77.1 kg, SpO2 99%. Body mass index  is 24.39 kg/m.   ASSESSMENT & PLAN   ASSESSMENT:   Diagnoses / Active Problems: Wernicke's encephalopathy  versus alcohol withdrawal with delirium tremens Alcohol use disorder Bipolar 1 disorder Rule out major neurocognitive disorder   Patient presents with problems of 1 week history of memory loss, confusion, inattention, subjective balance issues per patient; on exam, intermittent repetitive mouth movements; 12 beers a day alcohol use;  medication non adherence.  These problems raise concern for Warnecke's encephalopathy but may also be related to alcohol withdrawal, although alcohol withdrawal signs/symptoms are not present (although he is on Ativan taper).  Unlikely Korsakoff syndrome due to timeline of 1 week, although retrograde amnesia is present.  Given the concern for Wernicke's, the patient has received thiamine 100 mg IM twice in [facility] (also received a dose in ED).  He is on CIWA protocol with Ativan taper.  He currently does not appear to have any signs or symptoms of mania, but we will follow as his encephalopathy improves.  Additionally he has hypokalemia at 3.0.  He received 1 dose of 40 mEq oral in the ED and we will give him another 40 today and recheck his BMP in the morning.  We will reassess restarting medications for bipolar 1 disorder once the patient is stable with regard to Wernicke encephalopathy.  7/15: Per our assessment today and the patient's mother, the patient continues to have some confusion but has improved attention and short-term memory.  Given this improvement, we will assume that he has Wernicke encephalopathy and thiamine is beneficial and will continue dosing thiamine 100 mg intramuscular 3 times daily (IV thiamine not available at this facility) until he is no longer symptomatic and then switched to p.o thiamine.  Although the patient reports that he uses less alcohol than he previously stated, we will continue him on the Ativan taper.  Furthermore we will  restarted him on his home mood stabilizer, abilify 10 once daily.  He expressed interest in being on Abilify Maintena long-acting injectable but our clinical pharmacist reported that his insurance would pays $0 so it would be very expensive.  Furthermore his hypokalemia has resolved.  No evidence that he is manic (the documented reason he came into the ED).  7/16: Patient appears to be back to his baseline mental status.  This is confirmed by his mother and will be confirmed by his friend (who is visiting him tonight) tomorrow.  He will finish his high dose thiamine tomorrow and if nothing changes can be discharged on Thursday.  Continues to have low CIWA scores.  Ativan taper finishes tomorrow.  No signs or symptoms of mania and is adherent with his Abilify 10 mg once daily without side effects.  7/17: Mother confirmed today the patient is at baseline mental status.  He received his last doses of high dose by me today and be switched to oral and discharged more.  Regarding his alcohol withdrawal, he will complete his Ativan taper today and if he is having no symptoms tomorrow will be safe to discharge in the afternoon.  Mother agreed to pick up the patient tomorrow afternoon and to watch over him and let him stay with her for next month.  She will make sure he follows up with his primary care provider and outpatient psychiatrist.  Patient has had consistently elevated blood pressures, unclear if related to hypertension or alcohol withdrawal.  Patient reports taking metoprolol but does not know the dose.  Will opt for a extended release 25 mg.     PLAN: Safety and Monitoring:             --  Involuntary admission to inpatient psychiatric unit for safety, stabilization and treatment             -- Daily contact with patient to assess and evaluate symptoms and progress in treatment             -- Patient's case to be discussed in multi-disciplinary team meeting             -- Observation Level : q15 minute  checks             -- Vital signs:  q12 hours             -- Precautions: suicide, elopement, and assault   2. Psychiatric Diagnoses and Treatment:  --Continue Abilify 10 mg once daily for mood stabilization. --  The risks/benefits/side-effects/alternatives to this medication were discussed in detail with the patient and time was given for questions. The patient consents to medication trial.              -- Metabolic profile and EKG monitoring obtained while on an atypical antipsychotic. See #4 below for values.              -- Encouraged patient to participate in unit milieu and in scheduled group therapies              -- Short Term Goals: Ability to identify changes in lifestyle to reduce recurrence of condition will improve, Ability to verbalize feelings will improve, Ability to disclose and discuss suicidal ideas, Ability to demonstrate self-control will improve, Ability to identify and develop effective coping behaviors will improve, Ability to maintain clinical measurements within normal limits will improve, Compliance with prescribed medications will improve, and Ability to identify triggers associated with substance abuse/mental health issues will improve             -- Long Term Goals: Improvement in symptoms so as ready for discharge                3. Medical Issues Being Addressed:              -- Wernicke's encephalopathy: Continue thiamine 100 mg intramuscular 3 times daily for Warnecke encephalopathy.  Restart thiamine p.o. on Thursday following high-dose IM thiamine.             -- Alcohol withdrawal: Continue CIWA protocol with Ativan taper (will complete tomorrow).   -- Hypertension: Started metoprolol XR 25 mg once daily for hypertension.                4. Routine and other pertinent labs reviewed:  Potassium 7/15: 4.0  EKG monitoring: QTc: 436  Metabolism / endocrine: BMI: Body mass index is 24.39 kg/m. Prolactin: Recent Labs       Lab Results  Component Value  Date    PROLACTIN 16.0 (H) 12/31/2015    PROLACTIN 5.5 12/30/2015      Lipid Panel: Recent Labs       Lab Results  Component Value Date    CHOL 148 10/29/2022    TRIG 80 10/29/2022    HDL 43 10/29/2022    CHOLHDL 3.4 10/29/2022    VLDL 16 10/29/2022    LDLCALC 89 10/29/2022    LDLCALC 144 (H) 12/31/2015      HbgA1c: Last Labs     Hgb A1c MFr Bld (%)  Date Value  12/31/2015 5.5      TSH: Last Labs     TSH (uIU/mL)  Date Value  10/29/2022 0.374  04/15/2012  0.750        Labs to order: None  5. Discharge Planning:              -- Social work and case management to assist with discharge planning and identification of hospital follow-up needs prior to discharge             -- Estimated LOS: 7-10 days              -- Discharge Concerns: Need to establish a safety plan; Medication compliance and effectiveness             -- Discharge Goals: Return home with outpatient referrals for mental health follow-up including medication management/psychotherapy     I certify that inpatient services furnished can reasonably be expected to improve the patient's condition.      Meryl Dare, MD 11/01/2022, 10:46 AM

## 2022-11-02 DIAGNOSIS — F319 Bipolar disorder, unspecified: Secondary | ICD-10-CM

## 2022-11-02 MED ORDER — HYDROXYZINE HCL 25 MG PO TABS: 25.0000 mg | ORAL_TABLET | Freq: Three times a day (TID) | ORAL | 0 refills | Status: DC | PRN

## 2022-11-02 MED ORDER — VITAMIN B-1 100 MG PO TABS
100.0000 mg | ORAL_TABLET | Freq: Every day | ORAL | Status: DC
  Administered 2022-11-02: 100 mg via ORAL
  Filled 2022-11-02 (×3): qty 1

## 2022-11-02 MED ORDER — METOPROLOL SUCCINATE ER 25 MG PO TB24: 25.0000 mg | ORAL_TABLET | Freq: Every day | ORAL | 0 refills | Status: DC

## 2022-11-02 MED ORDER — ARIPIPRAZOLE 10 MG PO TABS: 10.0000 mg | ORAL_TABLET | Freq: Every day | ORAL | 0 refills | Status: DC

## 2022-11-02 MED ORDER — TRAZODONE HCL 50 MG PO TABS: 50.0000 mg | ORAL_TABLET | Freq: Every evening | ORAL | 0 refills | Status: DC | PRN

## 2022-11-02 MED ORDER — VITAMIN B-1 100 MG PO TABS: 100.0000 mg | ORAL_TABLET | Freq: Every day | ORAL | 0 refills | Status: DC

## 2022-11-02 NOTE — [facility] Suicide Risk Assessment (Signed)
Suicide Risk Assessment  Discharge Assessment    [facility] Discharge Suicide Risk Assessment     During the patient's hospitalization, patient had extensive initial psychiatric evaluation, and follow-up psychiatric evaluations every day.   Psychiatric diagnoses provided upon initial assessment: Wernicke encephalopathy   Patient's psychiatric medications were adjusted on admission: Started thiamine 100 mg IM 3 times daily for Wernicke encephalopathy.  Started on a Ativan taper consisting of lorazepam 1 mg 4 times daily then 3 times daily then twice daily then once daily.   During the hospitalization, other adjustments were made to the patient's psychiatric medication regimen: Following 3 full days of high dose thiamine switched to oral supplementation.  Restarted patient's Abilify 10 mg once daily for mood stabilization and metoprolol 25 mg   Patient's care was discussed during the interdisciplinary team meeting every day during the hospitalization.   The patient is having side effects to prescribed psychiatric medication.   Gradually, patient started adjusting to milieu. The patient was evaluated each day by a clinical provider to ascertain response to treatment. Improvement was noted by the patient's report of decreasing symptoms, improved sleep and appetite, affect, medication tolerance, behavior, and participation in unit programming.  Patient was asked each day to complete a self inventory noting mood, mental status, pain, new symptoms, anxiety and concerns.   Symptoms were reported as significantly decreased or resolved completely by discharge.  The patient reports that their mood is stable.  The patient denied having suicidal thoughts for more than 48 hours prior to discharge.  Patient denies having homicidal thoughts.  Patient denies having auditory hallucinations.  Patient denies any visual hallucinations or other symptoms of psychosis.  The patient was motivated to continue taking  medication with a goal of continued improvement in mental health.    The patient reports their target psychiatric symptoms of confusion and memory loss responded well to the psychiatric medications, and the patient reports overall benefit other psychiatric hospitalization. Supportive psychotherapy was provided to the patient. The patient also participated in regular group therapy while hospitalized. Coping skills, problem solving as well as relaxation therapies were also part of the unit programming.   Labs were reviewed with the patient, and abnormal results were discussed with the patient.   The patient is able to verbalize their individual safety plan to this provider.   # It is recommended to the patient to continue psychiatric medications as prescribed, after discharge from the hospital.     # It is recommended to the patient to follow up with your outpatient psychiatric provider and PCP.   # It was discussed with the patient, the impact of alcohol, drugs, tobacco have been there overall psychiatric and medical wellbeing, and total abstinence from substance use was recommended the patient.ed.   # Prescriptions provided or sent directly to preferred pharmacy at discharge. Patient agreeable to plan. Given opportunity to ask questions. Appears to feel comfortable with discharge.    # In the event of worsening symptoms, the patient is instructed to call the crisis hotline, 911 and or go to the nearest ED for appropriate evaluation and treatment of symptoms. To follow-up with primary care provider for other medical issues, concerns and or health care needs   # Patient was discharged with mother with a plan to follow up as noted below.     Principal Problem: Wernicke encephalopathy Discharge Diagnoses: Principal Problem:   Wernicke encephalopathy Active Problems:   Bipolar 1 disorder (HCC)   Alcohol use disorder   Total Time  spent with patient: 15 minutes  Musculoskeletal: Strength &  Muscle Tone: within normal limits Gait & Station: normal Patient leans: N/A  Psychiatric Specialty Exam  Presentation  General Appearance:  Appropriate for Environment; Casual; Fairly Groomed  Eye Contact: Good  Speech: Normal Rate; Clear and Coherent  Speech Volume: Normal  Handedness:No data recorded  Mood and Affect  Mood: Euthymic  Duration of Depression Symptoms: No data recorded Affect: Appropriate; Congruent; Full Range   Thought Process  Thought Processes: Linear  Descriptions of Associations:Intact  Orientation:Full (Time, Place and Person)  Thought Content:Logical  History of Schizophrenia/Schizoaffective disorder:No  Duration of Psychotic Symptoms:N/A  Hallucinations:Hallucinations: None  Ideas of Reference:None  Suicidal Thoughts:Suicidal Thoughts: No  Homicidal Thoughts:Homicidal Thoughts: No   Sensorium  Memory: Immediate Good; Recent Good; Remote Good  Judgment: Fair  Insight: Fair   Art therapist  Concentration: Fair  Attention Span: Fair  Recall: Good  Fund of Knowledge: Good  Language: Good   Psychomotor Activity  Psychomotor Activity: Psychomotor Activity: Normal   Assets  Assets: Communication Skills; Desire for Improvement; Housing; Health and safety inspector; Social Support; Transportation; Vocational/Educational   Sleep  Sleep: Sleep: Fair Number of Hours of Sleep: 7   Physical Exam: Physical Exam Vitals and nursing note reviewed.  HENT:     Head: Normocephalic and atraumatic.  Pulmonary:     Effort: Pulmonary effort is normal.  Neurological:     General: No focal deficit present.     Mental Status: He is alert.    Review of Systems  Constitutional:  Negative for fever.  Cardiovascular:  Negative for chest pain and palpitations.  Gastrointestinal:  Negative for constipation, diarrhea, nausea and vomiting.  Neurological:  Negative for dizziness, weakness and headaches.    Blood pressure (!) 142/80, pulse 69, temperature 98.2 F (36.8 C), temperature source Oral, resp. rate 18, height 5\' 10"  (1.778 m), weight 77.1 kg, SpO2 100%. Body mass index is 24.39 kg/m.  Mental Status Per Nursing Assessment::   On Admission:  Suicidal ideation indicated by patient  Demographic Factors:  Male  Loss Factors: NA  Historical Factors: NA  Risk Reduction Factors:   Employed, Living with another person, especially a relative, and Positive social support  Continued Clinical Symptoms:  Alcohol/Substance Abuse/Dependencies Experiencing any mania symptoms currently.  Cognitive Features That Contribute To Risk:  None    Suicide Risk:  Minimal: No identifiable suicidal ideation.  Patients presenting with no risk factors but with morbid ruminations; may be classified as minimal risk based on the severity of the depressive symptoms   Follow-up Information     [place] Go on 11/10/2022.   Specialty: Professional Counselor Why: You have an appointment for therapy services on 11/10/22 at 10:00 am, in person. Contact information: [address] [place] [address] [phone]         [facility] Go on 11/23/2022.   Why: You have an appointment for medication management services on 11/23/22 at 11:00 am.  This appointment will be held in person. Contact information: [address] [place] [address] [phone]                 Plan Of Care/Follow-up recommendations:  Activity: as tolerated   Diet: heart healthy   Other: -Follow-up with your outpatient psychiatric provider -instructions on appointment date, time, and address (location) are provided to you in discharge paperwork.   -Take your psychiatric medications as prescribed at discharge - instructions are provided to  you in the discharge paperwork   -Follow-up with outpatient primary care doctor and other specialists -for management  of chronic medical disease, including: Blood pressure management.     -Testing: Follow-up with outpatient provider for abnormal lab results: None.   -Recommend abstinence from alcohol, tobacco, and other illicit drug use at discharge.    -If your psychiatric symptoms recur, worsen, or if you have side effects to your psychiatric medications, call your outpatient psychiatric provider, 911, [phone] or go to the nearest emergency department.   -If suicidal thoughts recur, call your outpatient psychiatric provider, 911, 988 or go to the nearest emergency department.   Meryl Dare, MD 11/02/2022, 12:15 PM

## 2022-11-02 NOTE — Discharge Summary (Signed)
Physician Discharge Summary Note  Patient:  [name] is an 62 y.o., male MRN:  [id number] DOB:  10-01-1960 Patient phone:  [phone] (home)  Patient address:   [address] [place] [facility] [address],  Total Time spent with patient: 15 minutes  Date of Admission:  10/29/2022 Date of Discharge: 11/02/22  Reason for Admission: Confusion and memory loss ("mania" per ED report)  History of Present Illness from admission:  [name] is a 62 year old male with a past psychiatric history of bipolar 1 disorder, alcohol use disorder; numerous hospitalizations in the past (unclear reasons); no self-harm or suicide attempts.  He presents from the ED to the behavioral health [facility] for concern for mania.   Due to confusion and memory issues, the patient is unable to give a reliable or accurate history.  When asked why the patient is here, he says "playing guitar," and does not describe anything described in the emergency department notes.  He says he has not been adhered to his medications due to transportation issues.  He reports no concerns for why he is here, despite being prompted with information.  He is able to provide past psychiatric history and substance use history but no history of presenting illness.   Collateral (mother, [phone]): She reports that the patient has been doing with the past 5 years without any manic episodes or other mental concerns.  For a long time he had been lithium but was recently switched to Abilify by his outpatient psychiatrist and was stable on this medication.  Apparently Dina told her that he had not been taking his medications recently and for no clear reason.  She reports that he uses alcohol and "smells like a brewery" but uses no other substances to her knowledge.  She accompanied him to see his outpatient psychiatrist this past Friday.  She confirms that he has been having memory loss recently and furthermore that he believes he had received a  large sum of money from a lawsuit he is currently involved in.  She did not notice any of the mouth movements that were noted on exam today.  She reports that he has never had an episode the past with this sort of memory loss.   Principal Problem: Wernicke encephalopathy Discharge Diagnoses: Principal Problem:   Wernicke encephalopathy Active Problems:   Bipolar 1 disorder (HCC)   Alcohol use disorder   Past Psychiatric History: bipolar 1 disorder, alcohol use disorder; numerous hospitalizations in the past (unclear reasons); no self-harm or suicide attempts   Past Medical History:  Past Medical History:  Diagnosis Date   Bipolar 1 disorder (HCC)     Past Surgical History:  Procedure Laterality Date   EYE SURGERY     Family History:  Family History  Problem Relation Age of Onset   Hyperlipidemia Mother    Alcoholism Mother    Suicidality Cousin    Family Psychiatric  History: She reports no family history of psychiatric conditions or substance use. Social History:  Social History   Substance and Sexual Activity  Alcohol Use Yes   Comment: weekly     Social History   Substance and Sexual Activity  Drug Use Not Currently    Social History   Socioeconomic History   Marital status: Single    Spouse name: Not on file   Number of children: Not on file   Years of education: Not on file   Highest education level: Not on file  Occupational History   Not on file  Tobacco Use   Smoking status: Never   Smokeless tobacco: Never  Vaping Use   Vaping status: Never Used  Substance and Sexual Activity   Alcohol use: Yes    Comment: weekly   Drug use: Not Currently   Sexual activity: Not on file  Other Topics Concern   Not on file  Social History Narrative   Not on file   Social Determinants of Health   Financial Resource Strain: Not on file  Food Insecurity: No Food Insecurity (10/29/2022)   Hunger Vital Sign    Worried About Running Out of Food in the Last Year:  Never true    Ran Out of Food in the Last Year: Never true  Transportation Needs: No Transportation Needs (10/29/2022)   PRAPARE - Administrator, Civil Service (Medical): No    Lack of Transportation (Non-Medical): No  Physical Activity: Not on file  Stress: Not on file  Social Connections: Not on file    Hospital Course:   Patient presented to the [facility] with confusion and memory loss.  He was diagnosed at [facility] encephalopathy and started on high-dose thiamine.  Within 24 hours he was no longer confused and had memories of the previous day and by day 3 was back to his baseline per his mother.  Based on report alcohol use, he was started on an Ativan taper for concern of alcohol withdrawal and had CIWA scores of 0 and 1 during the hospitalization.  He has been educated on Wernicke encephalopathy and given 30-day supply of oral thiamine supplementation.  During the patient's hospitalization, patient had extensive initial psychiatric evaluation, and follow-up psychiatric evaluations every day.  Psychiatric diagnoses provided upon initial assessment: Wernicke encephalopathy  Patient's psychiatric medications were adjusted on admission: Started thiamine 100 mg IM 3 times daily for Wernicke encephalopathy.  Started on a Ativan taper consisting of lorazepam 1 mg 4 times daily then 3 times daily then twice daily then once daily.  During the hospitalization, other adjustments were made to the patient's psychiatric medication regimen: Following 3 full days of high dose thiamine switched to oral supplementation.  Restarted patient's Abilify 10 mg once daily for mood stabilization and metoprolol 25 mg  Patient's care was discussed during the interdisciplinary team meeting every day during the hospitalization.  The patient is having side effects to prescribed psychiatric medication.  Gradually, patient started adjusting to milieu. The patient was evaluated each day by a clinical provider to  ascertain response to treatment. Improvement was noted by the patient's report of decreasing symptoms, improved sleep and appetite, affect, medication tolerance, behavior, and participation in unit programming.  Patient was asked each day to complete a self inventory noting mood, mental status, pain, new symptoms, anxiety and concerns.   Symptoms were reported as significantly decreased or resolved completely by discharge.  The patient reports that their mood is stable.  The patient denied having suicidal thoughts for more than 48 hours prior to discharge.  Patient denies having homicidal thoughts.  Patient denies having auditory hallucinations.  Patient denies any visual hallucinations or other symptoms of psychosis.  The patient was motivated to continue taking medication with a goal of continued improvement in mental health.   The patient reports their target psychiatric symptoms of confusion and memory loss responded well to the psychiatric medications, and the patient reports overall benefit other psychiatric hospitalization. Supportive psychotherapy was provided to the patient. The patient also participated in regular group therapy while hospitalized. Coping skills, problem solving  as well as relaxation therapies were also part of the unit programming.  Labs were reviewed with the patient, and abnormal results were discussed with the patient.  The patient is able to verbalize their individual safety plan to this provider.  # It is recommended to the patient to continue psychiatric medications as prescribed, after discharge from the hospital.    # It is recommended to the patient to follow up with your outpatient psychiatric provider and PCP.  # It was discussed with the patient, the impact of alcohol, drugs, tobacco have been there overall psychiatric and medical wellbeing, and total abstinence from substance use was recommended the patient.ed.  # Prescriptions provided or sent directly to  preferred pharmacy at discharge. Patient agreeable to plan. Given opportunity to ask questions. Appears to feel comfortable with discharge.    # In the event of worsening symptoms, the patient is instructed to call the crisis hotline, 911 and or go to the nearest ED for appropriate evaluation and treatment of symptoms. To follow-up with primary care provider for other medical issues, concerns and or health care needs  # Patient was discharged with mother with a plan to follow up as noted below.  On day of discharge patient reports no concerns or changes overnight.  He continues to be as baseline mentation.  He was educated on his condition and the importance of continuing the thiamine supplementation and decreasing his alcohol consumption.  He denies SI and HI, AVH.  He reports normal sleep and appetite.  He reports no increased activity alongside elevated or irritable mood.  He got poor sleep last night but his roommate snores very loudly.  Encouraged the patient to call his outpatient psychiatrist and to go to [facility] if he notices any mania symptoms.    Physical Findings: CIWA:  CIWA-Ar Total: 0   Musculoskeletal: Strength & Muscle Tone: within normal limits Gait & Station: normal Patient leans: N/A   Psychiatric Specialty Exam:  Presentation  General Appearance:  Appropriate for Environment; Casual; Fairly Groomed  Eye Contact: Good  Speech: Normal Rate; Clear and Coherent  Speech Volume: Normal  Handedness:No data recorded  Mood and Affect  Mood: Euthymic  Affect: Appropriate; Congruent; Full Range   Thought Process  Thought Processes: Linear  Descriptions of Associations:Intact  Orientation:Full (Time, Place and Person)  Thought Content:Logical  History of Schizophrenia/Schizoaffective disorder:No  Duration of Psychotic Symptoms:N/A  Hallucinations:Hallucinations: None  Ideas of Reference:None  Suicidal Thoughts:Suicidal Thoughts: No  Homicidal  Thoughts:Homicidal Thoughts: No   Sensorium  Memory: Immediate Good; Recent Good; Remote Good  Judgment: Fair  Insight: Fair   Art therapist  Concentration: Fair  Attention Span: Fair  Recall: Good  Fund of Knowledge: Good  Language: Good   Psychomotor Activity  Psychomotor Activity: Psychomotor Activity: Normal   Assets  Assets: Communication Skills; Desire for Improvement; Housing; Health and safety inspector; Social Support; Transportation; Vocational/Educational   Sleep  Sleep: Sleep: Fair Number of Hours of Sleep: 7    Physical Exam: Physical Exam Vitals and nursing note reviewed.  HENT:     Head: Normocephalic and atraumatic.  Pulmonary:     Effort: Pulmonary effort is normal.  Neurological:     General: No focal deficit present.     Mental Status: He is alert.    Review of Systems  Constitutional:  Negative for fever.  Cardiovascular:  Negative for chest pain and palpitations.  Gastrointestinal:  Negative for constipation, diarrhea, nausea and vomiting.  Neurological:  Negative for dizziness, weakness and  headaches.   Blood pressure (!) 142/80, pulse 69, temperature 98.2 F (36.8 C), temperature source Oral, resp. rate 18, height 5\' 10"  (1.778 m), weight 77.1 kg, SpO2 100%. Body mass index is 24.39 kg/m.   Social History   Tobacco Use  Smoking Status Never  Smokeless Tobacco Never   Tobacco Cessation:  N/A, patient does not currently use tobacco products   Blood Alcohol level:  Lab Results  Component Value Date   ETH <10 10/28/2022   ETH <10 09/21/2017    Metabolic Disorder Labs:  Lab Results  Component Value Date   HGBA1C 5.5 12/31/2015   MPG 111 12/31/2015   MPG 114 12/30/2015   Lab Results  Component Value Date   PROLACTIN 16.0 (H) 12/31/2015   PROLACTIN 5.5 12/30/2015   Lab Results  Component Value Date   CHOL 148 10/29/2022   TRIG 80 10/29/2022   HDL 43 10/29/2022   CHOLHDL 3.4 10/29/2022    VLDL 16 10/29/2022   LDLCALC 89 10/29/2022   LDLCALC 144 (H) 12/31/2015    See Psychiatric Specialty Exam and Suicide Risk Assessment completed by Attending Physician prior to discharge.  Discharge destination:  Other:  Home with mom  Is patient on multiple antipsychotic therapies at discharge:  No   Has Patient had three or more failed trials of antipsychotic monotherapy by history:  No  Recommended Plan for Multiple Antipsychotic Therapies: NA  Discharge Instructions     Diet - low sodium heart healthy   Complete by: As directed    Diet - low sodium heart healthy   Complete by: As directed    Increase activity slowly   Complete by: As directed    Increase activity slowly   Complete by: As directed       Allergies as of 11/02/2022   No Known Allergies      Medication List     STOP taking these medications    hydrOXYzine 50 MG capsule Commonly known as: VISTARIL   losartan 25 MG tablet Commonly known as: COZAAR   mirtazapine 30 MG tablet Commonly known as: REMERON   QUEtiapine 300 MG tablet Commonly known as: SEROQUEL       TAKE these medications      Indication  ARIPiprazole 10 MG tablet Commonly known as: ABILIFY Take 1 tablet (10 mg total) by mouth daily.  Indication: Manic Phase of Manic-Depression   hydrOXYzine 25 MG tablet Commonly known as: ATARAX Take 1 tablet (25 mg total) by mouth 3 (three) times daily as needed for anxiety.  Indication: Feeling Anxious   metoprolol succinate 25 MG 24 hr tablet Commonly known as: TOPROL-XL Take 1 tablet (25 mg total) by mouth daily.  Indication: High Blood Pressure Disorder   thiamine 100 MG tablet Commonly known as: Vitamin B-1 Take 1 tablet (100 mg total) by mouth daily.  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 What changed:  medication strength how much to  take when to take this reasons to take this  Indication: Trouble Sleeping        Follow-up Information     [place] Go on 11/10/2022.   Specialty: Professional Counselor Why: You have an appointment for therapy services on 11/10/22 at 10:00 am, in person. Contact information: [address] [name] [address] [phone]  [facility] Go on 11/23/2022.   Why: You have an appointment for medication management services on 11/23/22 at 11:00 am.  This appointment will be held in person. Contact information: [address] [place] [address] [phone]                 Follow-up recommendations:   Activity: as tolerated  Diet: heart healthy  Other: -Follow-up with your outpatient psychiatric provider -instructions on appointment date, time, and address (location) are provided to you in discharge paperwork.  -Take your psychiatric medications as prescribed at discharge - instructions are provided to you in the discharge paperwork  -Follow-up with outpatient primary care doctor and other specialists -for management of chronic medical disease, including: Blood pressure management.    -Testing: Follow-up with outpatient provider for abnormal lab results: None.  -Recommend abstinence from alcohol, tobacco, and other illicit drug use at discharge.   -If your psychiatric symptoms recur, worsen, or if you have side effects to your psychiatric medications, call your outpatient psychiatric provider, 911, 988 or go to the nearest emergency department.  -If suicidal thoughts recur, call your outpatient psychiatric provider, 911, 988 or go to the nearest emergency department.   Signed: Meryl Dare, MD 11/02/2022, 12:14 PM

## 2022-11-02 NOTE — Progress Notes (Signed)
Pt discharged at this time. Pt left facility with family. Pt removed all belongings and verbalized understanding of medications and discharge instructions. Pt denies SI/HI/AVH.

## 2022-11-02 NOTE — Plan of Care (Signed)

## 2022-11-02 NOTE — Progress Notes (Deleted)
[facility] MD Progress Note  11/02/2022 10:29 AM [name]  MRN:  [id number] Subjective:   [name] is a 62 year old male with a past psychiatric history of bipolar 1 disorder, alcohol use disorder; numerous hospitalizations in the past (unclear reasons); no self-harm or suicide attempts. He presents from the ED to the behavioral health [facility] for concern for mania.   Case was discussed in the multidisciplinary team. MAR was reviewed and patient is compliant with medications.  Psychiatric Team made the following recommendations yesterday: Continue thiamine 100 mg intramuscular three times daily (continued through Wednesday) Continue CIWA protocol with Ativan taper Started metoprolol 25 mg XR once daily for hypertension.  Patient reports no concerns today.  Nursing staff reported the patient only slept 1 to 2 hours last night.  The subject reports that he slept much more than 1 to 2 hours.  Patient reports that this is due to roommate who snores very loudly (the snoring can be heard down the hall).  The patient denies having increased activity or elevated mood or irritable mood.  He reports his appetite is fine.  Patient denies nausea, vomiting, diaphoresis, tremors.  Collateral [name], mother, [phone]): Informed mother that due to to some elevated blood pressures and lack of sleep that we are going to keep the patient 1 more day for observation.  She is agreeable to this and will pick him up at 1 PM tomorrow.  No questions or concerns.   Principal Problem: Wernicke encephalopathy Diagnosis: Principal Problem:   Wernicke encephalopathy Active Problems:   Bipolar 1 disorder (HCC)   Alcohol use disorder  Total Time spent with patient: 15 minutes  Past Psychiatric History: bipolar 1 disorder, alcohol use disorder; numerous hospitalizations in the past (unclear reasons); no self-harm or suicide attempts   Past Medical History:  Past Medical History:  Diagnosis Date   Bipolar 1  disorder (HCC)     Past Surgical History:  Procedure Laterality Date   EYE SURGERY     Family History:  Family History  Problem Relation Age of Onset   Hyperlipidemia Mother    Alcoholism Mother    Suicidality Cousin    Family Psychiatric  History: Patient reports no family history. Social History:  Social History   Substance and Sexual Activity  Alcohol Use Yes   Comment: weekly     Social History   Substance and Sexual Activity  Drug Use Not Currently    Social History   Socioeconomic History   Marital status: Single    Spouse name: Not on file   Number of children: Not on file   Years of education: Not on file   Highest education level: Not on file  Occupational History   Not on file  Tobacco Use   Smoking status: Never   Smokeless tobacco: Never  Vaping Use   Vaping status: Never Used  Substance and Sexual Activity   Alcohol use: Yes    Comment: weekly   Drug use: Not Currently   Sexual activity: Not on file  Other Topics Concern   Not on file  Social History Narrative   Not on file   Social Determinants of Health   Financial Resource Strain: Not on file  Food Insecurity: No Food Insecurity (10/29/2022)   Hunger Vital Sign    Worried About Running Out of Food in the Last Year: Never true    Ran Out of Food in the Last Year: Never true  Transportation Needs: No Transportation Needs (10/29/2022)  PRAPARE - Administrator, Civil Service (Medical): No    Lack of Transportation (Non-Medical): No  Physical Activity: Not on file  Stress: Not on file  Social Connections: Not on file   Additional Social History:   Lives with his mother and has a good relationship with her.  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     Sleep: Good slept from 11-8 totaling 9 hours  Appetite:  Good  Current Medications: Current Facility-Administered Medications  Medication Dose Route Frequency Provider Last Rate Last Admin    acetaminophen (TYLENOL) tablet 650 mg  650 mg Oral Q6H PRN Bobbitt, Shalon E, NP   650 mg at 11/02/22 0457   alum & mag hydroxide-simeth (MAALOX/MYLANTA) 200-200-20 MG/5ML suspension 30 mL  30 mL Oral Q4H PRN Bobbitt, Shalon E, NP       ARIPiprazole (ABILIFY) tablet 10 mg  10 mg Oral Daily Meryl Dare, MD   10 mg at 11/02/22 1610   diphenhydrAMINE (BENADRYL) capsule 50 mg  50 mg Oral TID PRN Bobbitt, Shalon E, NP       Or   diphenhydrAMINE (BENADRYL) injection 50 mg  50 mg Intramuscular TID PRN Bobbitt, Shalon E, NP       feeding supplement (ENSURE ENLIVE / ENSURE PLUS) liquid 237 mL  237 mL Oral BID BM Nwoko, Agnes I, NP   237 mL at 11/02/22 0806   haloperidol (HALDOL) tablet 5 mg  5 mg Oral TID PRN Bobbitt, Shalon E, NP       Or   haloperidol lactate (HALDOL) injection 5 mg  5 mg Intramuscular TID PRN Bobbitt, Shalon E, NP       hydrOXYzine (ATARAX) tablet 25 mg  25 mg Oral TID PRN Bobbitt, Shalon E, NP   25 mg at 11/01/22 2111   LORazepam (ATIVAN) tablet 2 mg  2 mg Oral TID PRN Bobbitt, Shalon E, NP       Or   LORazepam (ATIVAN) injection 2 mg  2 mg Intramuscular TID PRN Bobbitt, Shalon E, NP   2 mg at 10/29/22 1140   magnesium hydroxide (MILK OF MAGNESIA) suspension 30 mL  30 mL Oral Daily PRN Bobbitt, Shalon E, NP       metoprolol succinate (TOPROL-XL) 24 hr tablet 25 mg  25 mg Oral Daily Meryl Dare, MD   25 mg at 11/02/22 9604   multivitamin with minerals tablet 1 tablet  1 tablet Oral Daily Meryl Dare, MD   1 tablet at 11/02/22 5409   thiamine (Vitamin B-1) tablet 100 mg  100 mg Oral Daily Meryl Dare, MD   100 mg at 11/02/22 0805   traZODone (DESYREL) tablet 50 mg  50 mg Oral QHS PRN Bobbitt, Shalon E, NP   50 mg at 11/01/22 2111    Lab Results:  No results found for this or any previous visit (from the past 48 hour(s)).   Blood Alcohol level:  Lab Results  Component Value Date   ETH <10 10/28/2022   ETH <10 09/21/2017    Metabolic Disorder Labs: Lab Results   Component Value Date   HGBA1C 5.5 12/31/2015   MPG 111 12/31/2015   MPG 114 12/30/2015   Lab Results  Component Value Date   PROLACTIN 16.0 (H) 12/31/2015   PROLACTIN 5.5 12/30/2015   Lab Results  Component Value Date   CHOL 148 10/29/2022   TRIG 80 10/29/2022   HDL 43 10/29/2022   CHOLHDL 3.4 10/29/2022  VLDL 16 10/29/2022   LDLCALC 89 10/29/2022   LDLCALC 144 (H) 12/31/2015    Physical Findings: CIWA:  CIWA-Ar Total: 0   Musculoskeletal: Strength & Muscle Tone: within normal limits Gait & Station: normal Patient leans: N/A  Psychiatric Specialty Exam:  Presentation  General Appearance:  Appropriate for Environment  Eye Contact: Good  Speech: Clear and Coherent  Speech Volume: Normal  Handedness:No data recorded  Mood and Affect  Mood: Euthymic  Affect: Appropriate   Thought Process  Thought Processes: Coherent  Descriptions of Associations:Intact  Orientation:Full (Time, Place and Person)  Thought Content:Logical  History of Schizophrenia/Schizoaffective disorder:No  Duration of Psychotic Symptoms:N/A  Hallucinations:Hallucinations: None  Ideas of Reference:None  Suicidal Thoughts:Suicidal Thoughts: No  Homicidal Thoughts:Homicidal Thoughts: No   Sensorium  Memory: Immediate Good; Recent Good; Remote Good  Judgment: Good  Insight: Good   Executive Functions  Concentration: Good  Attention Span: Good  Recall: Good  Fund of Knowledge: Good  Language: Good   Psychomotor Activity  Psychomotor Activity: Psychomotor Activity: Normal   Assets  Assets: Communication Skills; Desire for Improvement; Housing; Health and safety inspector; Social Support; Transportation; Vocational/Educational   Sleep  Sleep: Sleep: Good Number of Hours of Sleep: 7    Physical Exam: Physical Exam Vitals and nursing note reviewed.  HENT:     Head: Normocephalic and atraumatic.  Pulmonary:     Effort: Pulmonary  effort is normal.  Neurological:     General: No focal deficit present.     Mental Status: He is alert.  Psychiatric:        Behavior: Behavior is cooperative.        Thought Content: Thought content normal.     Comments: Continued mild confusion.  Improved attention compared to yesterday as evidenced by remembering 5 serial numbers in 1 minute versus 0 numbers yesterday.    Review of Systems  Constitutional:  Negative for fever.  Cardiovascular:  Negative for chest pain and palpitations.  Gastrointestinal:  Negative for constipation, diarrhea, nausea and vomiting.  Neurological:  Negative for dizziness, weakness and headaches.  Psychiatric/Behavioral:  Negative for depression, hallucinations, memory loss and suicidal ideas. The patient does not have insomnia.    Blood pressure (!) 142/80, pulse 69, temperature 98.2 F (36.8 C), temperature source Oral, resp. rate 18, height 5\' 10"  (1.778 m), weight 77.1 kg, SpO2 100%. Body mass index is 24.39 kg/m.   ASSESSMENT & PLAN   ASSESSMENT:   Diagnoses / Active Problems: Wernicke's encephalopathy versus alcohol withdrawal with delirium tremens Alcohol use disorder Bipolar 1 disorder Rule out major neurocognitive disorder   Patient presents with problems of 1 week history of memory loss, confusion, inattention, subjective balance issues per patient; on exam, intermittent repetitive mouth movements; 12 beers a day alcohol use;  medication non adherence.  These problems raise concern for Warnecke's encephalopathy but may also be related to alcohol withdrawal, although alcohol withdrawal signs/symptoms are not present (although he is on Ativan taper).  Unlikely Korsakoff syndrome due to timeline of 1 week, although retrograde amnesia is present.  Given the concern for Wernicke's, the patient has received thiamine 100 mg IM twice in [facility] (also received a dose in ED).  He is on CIWA protocol with Ativan taper.  He currently does not appear to have  any signs or symptoms of mania, but we will follow as his encephalopathy improves.  Additionally he has hypokalemia at 3.0.  He received 1 dose of 40 mEq oral in the ED and we will give  him another 40 today and recheck his BMP in the morning.  We will reassess restarting medications for bipolar 1 disorder once the patient is stable with regard to Wernicke encephalopathy.  7/15: Per our assessment today and the patient's mother, the patient continues to have some confusion but has improved attention and short-term memory.  Given this improvement, we will assume that he has Wernicke encephalopathy and thiamine is beneficial and will continue dosing thiamine 100 mg intramuscular 3 times daily (IV thiamine not available at this facility) until he is no longer symptomatic and then switched to p.o thiamine.  Although the patient reports that he uses less alcohol than he previously stated, we will continue him on the Ativan taper.  Furthermore we will restarted him on his home mood stabilizer, abilify 10 once daily.  He expressed interest in being on Abilify Maintena long-acting injectable but our clinical pharmacist reported that his insurance would pays $0 so it would be very expensive.  Furthermore his hypokalemia has resolved.  No evidence that he is manic (the documented reason he came into the ED).  7/16: Patient appears to be back to his baseline mental status.  This is confirmed by his mother and will be confirmed by his friend (who is visiting him tonight) tomorrow.  He will finish his high dose thiamine tomorrow and if nothing changes can be discharged on Thursday.  Continues to have low CIWA scores.  Ativan taper finishes tomorrow.  No signs or symptoms of mania and is adherent with his Abilify 10 mg once daily without side effects.  7/17: Mother confirmed today the patient is at baseline mental status.  He received his last doses of high dose by me today and be switched to oral and discharged more.   Regarding his alcohol withdrawal, he will complete his Ativan taper today and if he is having no symptoms tomorrow will be safe to discharge in the afternoon.  Mother agreed to pick up the patient tomorrow afternoon and to watch over him and let him stay with her for next month.  She will make sure he follows up with his primary care provider and outpatient psychiatrist.  Patient has had consistently elevated blood pressures, unclear if related to hypertension or alcohol withdrawal.  Patient reports taking metoprolol but does not know the dose.  Will opt for a extended release 25 mg.  7/18: Nursing staff reporting patient sleep when 2 hours because of suspicion for mania.  However, patient reports that he was not sleeping due to roommate snoring, and he denies increased activity and elevated or irritable mood.  He is off the Ativan taper with no symptoms of alcohol withdrawal.  He was educated on why he needs to continue taking thiamine supplementation and importance of following up with his psychiatrist and primary care physician.  He had elevated blood pressures but on recheck they were normal.  We will not restart her medications for hypertension while inpatient unless he continues to have elevated blood pressures.     PLAN: Safety and Monitoring:             -- Involuntary admission to inpatient psychiatric unit for safety, stabilization and treatment             -- Daily contact with patient to assess and evaluate symptoms and progress in treatment             -- Patient's case to be discussed in multi-disciplinary team meeting             --  Observation Level : q15 minute checks             -- Vital signs:  q12 hours             -- Precautions: suicide, elopement, and assault   2. Psychiatric Diagnoses and Treatment:  --Continue Abilify 10 mg once daily for mood stabilization. --  The risks/benefits/side-effects/alternatives to this medication were discussed in detail with the patient and time  was given for questions. The patient consents to medication trial.              -- Metabolic profile and EKG monitoring obtained while on an atypical antipsychotic. See #4 below for values.              -- Encouraged patient to participate in unit milieu and in scheduled group therapies              -- Short Term Goals: Ability to identify changes in lifestyle to reduce recurrence of condition will improve, Ability to verbalize feelings will improve, Ability to disclose and discuss suicidal ideas, Ability to demonstrate self-control will improve, Ability to identify and develop effective coping behaviors will improve, Ability to maintain clinical measurements within normal limits will improve, Compliance with prescribed medications will improve, and Ability to identify triggers associated with substance abuse/mental health issues will improve             -- Long Term Goals: Improvement in symptoms so as ready for discharge                3. Medical Issues Being Addressed:              -- Wernicke's encephalopathy: Stopped IM thiamine.  Started thiamine 100 mg p.o. once daily.             -- Alcohol withdrawal: Done with Ativan taper and asymptomatic CIWA of 0.  -- Hypertension: Continue metoprolol XR 25 mg once daily for hypertension.  Continue to monitor blood pressures.                4. Routine and other pertinent labs reviewed:  Potassium 7/15: 4.0  EKG monitoring: QTc: 436  Metabolism / endocrine: BMI: Body mass index is 24.39 kg/m. Prolactin: Recent Labs       Lab Results  Component Value Date    PROLACTIN 16.0 (H) 12/31/2015    PROLACTIN 5.5 12/30/2015      Lipid Panel: Recent Labs       Lab Results  Component Value Date    CHOL 148 10/29/2022    TRIG 80 10/29/2022    HDL 43 10/29/2022    CHOLHDL 3.4 10/29/2022    VLDL 16 10/29/2022    LDLCALC 89 10/29/2022    LDLCALC 144 (H) 12/31/2015      HbgA1c: Last Labs     Hgb A1c MFr Bld (%)  Date Value  12/31/2015  5.5      TSH: Last Labs     TSH (uIU/mL)  Date Value  10/29/2022 0.374  04/15/2012 0.750        Labs to order: None  5. Discharge Planning:              -- Social work and case management to assist with discharge planning and identification of hospital follow-up needs prior to discharge             -- Estimated LOS: 7-10 days              --  Discharge Concerns: Need to establish a safety plan; Medication compliance and effectiveness             -- Discharge Goals: Return home with outpatient referrals for mental health follow-up including medication management/psychotherapy     I certify that inpatient services furnished can reasonably be expected to improve the patient's condition.      Meryl Dare, MD 11/02/2022, 10:29 AM

## 2022-11-02 NOTE — Discharge Instructions (Signed)
Dear [name],  It was a pleasure to take care of you during your stay at [facility] where you were treated for your Wernicke encephalopathy.  While you were here, you were:  observed and cared for by our nurses and nursing assistants  treated with medications by your psychiatrists  provided individual and group therapy by therapists  provided resources by our social workers and case managers  Please review the medication list provided to you at discharge and stop, start taking, or continue taking the medications listed there.  You should also follow-up with your primary care doctor, or start seeing one if you don't have one yet. If applicable, here are some scheduled follow-ups for you:  Follow-up Information     [facility] Go on 11/10/2022.   Specialty: Professional Counselor Why: You have an appointment for therapy services on 11/10/22 at 10:00 am, in person. Contact information: [address] [place] [address] [phone]         [facility] Go on 11/23/2022.   Why: You have an appointment for medication management services on 11/23/22 at 11:00 am.  This appointment will be held in person. Contact information: [address] [place] [address] [phone]                  I recommend abstinence from alcohol, tobacco, and other illicit drug use.   If your psychiatric symptoms or suicidal thoughts recur, worsen, or if you have side effects to your psychiatric medications, call your outpatient psychiatric provider, 911, [phone] or go to the nearest emergency department.  Take care!  Signed: Meryl Dare, MD 11/02/2022, 7:55 AM  Naloxone (Narcan) can help reverse an overdose when given to the victim quickly.  [place] offers free naloxone kits and instructions/training on its use.  Add naloxone to your first aid kit and you can help save a life. A prescription can be filled at your local  pharmacy or free kits are provided by the county.  Pick up your free kit at the following locations:   [place]:  [facility], [address] [place] [address] [phone]) [facility] [address] [place] [address] [phone]) [facility] [address] [place] [address]  [place]: [facility] [address] [place] [address] ([phone]) [facility] [address] [place] [address] [phone])

## 2022-11-02 NOTE — Group Note (Signed)
Date:  11/02/2022 Time:  12:22 PM                                                 [facility] LCSW Group Therapy Note    Group Date: @GROUPDATE @ Start Time: @GROUPSTARTTIME @ End Time: @GROUPENDTIME @  Type of Therapy and Topic:  Group Therapy:  Overcoming Obstacles  Participation Level:    Mood:  Description of Group:   In this group patients will be encouraged to explore what they see as obstacles to their own wellness and recovery. They will be guided to discuss their thoughts, feelings, and behaviors related to these obstacles. The group will process together ways to cope with barriers, with attention given to specific choices patients can make. Each patient will be challenged to identify changes they are motivated to make in order to overcome their obstacles. This group will be process-oriented, with patients participating in exploration of their own experiences as well as giving and receiving support and challenge from other group members.  Therapeutic Goals: 1. Patient will identify personal and current obstacles as they relate to admission. 2. Patient will identify barriers that currently interfere with their wellness or overcoming obstacles.  3. Patient will identify feelings, thought process and behaviors related to these barriers. 4. Patient will identify two changes they are willing to make to overcome these obstacles:    Summary of Patient Progress      Therapeutic Modalities:   Cognitive Behavioral Therapy Solution Focused Therapy Motivational Interviewing Relapse Prevention Therapy   Kaliann Coryell M ChriscoGroup Topic/Focus:  Developing a Wellness Toolbox:   The focus of this group is to help patients develop a "wellness toolbox" with skills and strategies to promote recovery upon discharge. Wellness Toolbox:   The focus of this group is to discuss various aspects of wellness, balancing those aspects and exploring ways to increase the ability to experience wellness.  Patients  will create a wellness toolbox for use upon discharge.    Participation Level:  Active  Participation Quality:  Appropriate  Affect:  Appropriate  Cognitive:  Appropriate  Insight: Appropriate  Engagement in Group:  Engaged  Modes of Intervention:  Discussion, Exploration, and Support  Additional Comments:    Memory Dance Tarah Buboltz 11/02/2022, 12:22 PM

## 2022-11-02 NOTE — Progress Notes (Signed)
  [facility] Adult Case Management Discharge Plan :  Will you be returning to the same living situation after discharge:  Yes,  patient will be returning back to his home with roommate  At discharge, do you have transportation home?: Yes,  Patient mom will be picking him up around 1 pm Do you have the ability to pay for your medications: Yes,  Pt has [employer] QHP  Release of information consent forms completed and in the chart;  Patient's signature needed at discharge.  Patient to Follow up at:  Follow-up Information     [place] Go on 11/10/2022.   Specialty: Professional Counselor Why: You have an appointment for therapy services on 11/10/22 at 10:00 am, in person. Contact information: [address] [place] [address] [phone]         [facility] Go on 11/23/2022.   Why: You have an appointment for medication management services on 11/23/22 at 11:00 am.  This appointment will be held in person. Contact information: [address] [place] [address] [phone]                 Next level of care provider has access to [facility] Link:no  Safety Planning and Suicide Prevention discussed: Yes,  roommate Dian Queen [phone]     Has patient been referred to the Quitline?: Patient does not use tobacco/nicotine products  Patient has been referred for addiction treatment: Patient refused referral for treatment.  Isabella Bowens, LCSWA 11/02/2022, 10:47 AM

## 2022-11-05 ENCOUNTER — Encounter (HOSPITAL_BASED_OUTPATIENT_CLINIC_OR_DEPARTMENT_OTHER): Payer: Self-pay | Admitting: *Deleted

## 2022-11-05 DIAGNOSIS — Z5321 Procedure and treatment not carried out due to patient leaving prior to being seen by health care provider: Secondary | ICD-10-CM | POA: Diagnosis not present

## 2022-11-05 DIAGNOSIS — Z760 Encounter for issue of repeat prescription: Secondary | ICD-10-CM | POA: Diagnosis present

## 2022-11-05 DIAGNOSIS — Z79899 Other long term (current) drug therapy: Secondary | ICD-10-CM | POA: Diagnosis not present

## 2022-11-05 NOTE — ED Triage Notes (Signed)
Pt reports spending several days in [place] after being discharged from the hospital; was walking in the rain and his prescriptions got wet. Requesting medications for several meds listed on his med list. Denies HI/SI

## 2022-11-06 ENCOUNTER — Emergency Department (HOSPITAL_BASED_OUTPATIENT_CLINIC_OR_DEPARTMENT_OTHER)
Admission: EM | Admit: 2022-11-06 | Discharge: 2022-11-06 | Disposition: A | Discharge status: Home / Self Care | Payer: 59 | Source: Home / Self Care | Attending: Emergency Medicine | Admitting: Emergency Medicine

## 2022-11-06 ENCOUNTER — Emergency Department (HOSPITAL_BASED_OUTPATIENT_CLINIC_OR_DEPARTMENT_OTHER)
Admission: EM | Admit: 2022-11-06 | Discharge: 2022-11-06 | Discharge status: Against Medical Advice | Payer: 59 | Attending: Emergency Medicine | Admitting: Emergency Medicine

## 2022-11-06 ENCOUNTER — Emergency Department (HOSPITAL_COMMUNITY)
Admission: EM | Admit: 2022-11-06 | Discharge: 2022-11-06 | Disposition: A | Discharge status: Home / Self Care | Payer: 59 | Source: Home / Self Care | Attending: Student | Admitting: Student

## 2022-11-06 ENCOUNTER — Encounter (HOSPITAL_BASED_OUTPATIENT_CLINIC_OR_DEPARTMENT_OTHER): Payer: Self-pay

## 2022-11-06 ENCOUNTER — Other Ambulatory Visit: Payer: Self-pay

## 2022-11-06 ENCOUNTER — Encounter (HOSPITAL_COMMUNITY): Payer: Self-pay

## 2022-11-06 DIAGNOSIS — Z79899 Other long term (current) drug therapy: Secondary | ICD-10-CM | POA: Insufficient documentation

## 2022-11-06 DIAGNOSIS — Z760 Encounter for issue of repeat prescription: Secondary | ICD-10-CM | POA: Insufficient documentation

## 2022-11-06 MED ORDER — METOPROLOL SUCCINATE ER 25 MG PO TB24: 25.0000 mg | ORAL_TABLET | Freq: Every day | ORAL | 0 refills | Status: DC

## 2022-11-06 MED ORDER — HYDROXYZINE HCL 25 MG PO TABS: 25.0000 mg | ORAL_TABLET | Freq: Three times a day (TID) | ORAL | 0 refills | Status: DC | PRN

## 2022-11-06 MED ORDER — VITAMIN B-1 100 MG PO TABS: 100.0000 mg | ORAL_TABLET | Freq: Every day | ORAL | 0 refills | Status: DC

## 2022-11-06 MED ORDER — ARIPIPRAZOLE 10 MG PO TABS
10.0000 mg | ORAL_TABLET | Freq: Every day | ORAL | Status: DC
  Administered 2022-11-06: 10 mg via ORAL
  Filled 2022-11-06: qty 1

## 2022-11-06 MED ORDER — TRAZODONE HCL 50 MG PO TABS
50.0000 mg | ORAL_TABLET | Freq: Once | ORAL | Status: AC
  Administered 2022-11-06: 50 mg via ORAL
  Filled 2022-11-06: qty 1

## 2022-11-06 MED ORDER — TRAZODONE HCL 50 MG PO TABS: 50.0000 mg | ORAL_TABLET | Freq: Every evening | ORAL | 0 refills | Status: DC | PRN

## 2022-11-06 MED ORDER — ARIPIPRAZOLE 10 MG PO TABS: 10.0000 mg | ORAL_TABLET | Freq: Every day | ORAL | 0 refills | Status: DC
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
## 2022-11-06 NOTE — ED Provider Notes (Signed)
[place] EMERGENCY DEPARTMENT AT [facility] Provider Note  CSN: [id number] Arrival date & time: 11/06/22 1952  Chief Complaint(s) Medication Refill  HPI [name] is a 62 y.o. male with PMH bipolar 1, alcohol use disorder with Warnicke's encephalopathy, cocaine use disorder who presents emergency department for evaluation of medication refill.  Patient seen earlier this morning at approximately 4 AM for similar complaints.  His medications were refilled but he states he went to the pharmacy and they did not have them.  Is requesting a dose of his medications here in the emergency department and take prescriptions.  Denies homicidal ideation, suicidal ideation, auditory or visual hallucinations.  Denies chest pain, shortness of breath, Donnell pain, nausea, vomiting or any systemic or medical complaints today.   Past Medical History Past Medical History:  Diagnosis Date   Bipolar 1 disorder [facility])    Patient Active Problem List   Diagnosis Date Noted   Bipolar 1 disorder (HCC) 10/29/2022   Wernicke encephalopathy 10/29/2022   Alcohol use disorder 10/29/2022   Anxiety and depression    Bipolar affective disorder in remission (HCC) 01/16/2016   Cocaine use disorder, mild, abuse (HCC) 12/31/2015   Home Medication(s) Prior to Admission medications   Medication Sig Start Date End Date Taking? Authorizing Provider  ARIPiprazole (ABILIFY) 10 MG tablet Take 1 tablet (10 mg total) by mouth daily. 11/06/22   Charlynne Pander, MD  hydrOXYzine (ATARAX) 25 MG tablet Take 1 tablet (25 mg total) by mouth 3 (three) times daily as needed for anxiety. 11/06/22   Charlynne Pander, MD  metoprolol succinate (TOPROL-XL) 25 MG 24 hr tablet Take 1 tablet (25 mg total) by mouth daily. 11/06/22   Charlynne Pander, MD  thiamine (VITAMIN B-1) 100 MG tablet Take 1 tablet (100 mg total) by mouth daily. 11/02/22   Massengill, Harrold Donath, MD  traZODone (DESYREL) 50 MG tablet Take 1 tablet (50 mg  total) by mouth at bedtime as needed for sleep. 11/06/22   Charlynne Pander, MD                                                                                                                                    Past Surgical History Past Surgical History:  Procedure Laterality Date   EYE SURGERY     Family History Family History  Problem Relation Age of Onset   Hyperlipidemia Mother    Alcoholism Mother    Suicidality Cousin     Social History Social History   Tobacco Use   Smoking status: Never   Smokeless tobacco: Never  Vaping Use   Vaping status: Never Used  Substance Use Topics   Alcohol use: Yes    Comment: weekly   Drug use: Not Currently   Allergies Patient has no known allergies.  Review of Systems Review of Systems  All other systems reviewed and are negative.   Physical Exam Vital  Signs  I have reviewed the triage vital signs BP (!) 145/78   Pulse 69   Temp 98.3 F (36.8 C) (Oral)   Resp 16   Ht 5\' 10"  (1.778 m)   Wt 77.1 kg   SpO2 98%   BMI 24.39 kg/m   Physical Exam Vitals and nursing note reviewed.  Constitutional:      General: He is not in acute distress.    Appearance: He is well-developed.  HENT:     Head: Normocephalic and atraumatic.  Eyes:     Conjunctiva/sclera: Conjunctivae normal.  Cardiovascular:     Rate and Rhythm: Normal rate and regular rhythm.     Heart sounds: No murmur heard. Pulmonary:     Effort: Pulmonary effort is normal. No respiratory distress.  Musculoskeletal:        General: No swelling.     Cervical back: Neck supple.  Skin:    General: Skin is warm and dry.  Neurological:     Mental Status: He is alert.  Psychiatric:        Mood and Affect: Mood normal.     ED Results and Treatments Labs (all labs ordered are listed, but only abnormal results are displayed) Labs Reviewed - No data to display                                                                                                                         Radiology No results found.  Pertinent labs & imaging results that were available during my care of the patient were reviewed by me and considered in my medical decision making (see MDM for details).  Medications Ordered in ED Medications  ARIPiprazole (ABILIFY) tablet 10 mg (has no administration in time range)  traZODone (DESYREL) tablet 50 mg (has no administration in time range)                                                                                                                                     Procedures Procedures  (including critical care time)  Medical Decision Making / ED Course   This patient presents to the ED for concern of medication refill, this involves an extensive number of treatment options, and is a complaint that carries with it a high risk of complications and morbidity.  The differential diagnosis includes medication refill, psychosis, progression of Wernicke's encephalopathy  MDM: Patient seen over  department for evaluation of medication refill.  Physical exam is unremarkable.  Patient given a dose of trazodone and Abilify per patient request and I provided paper refills for his medications for him to take to a pharmacy of his choosing.  At this time he does not meet inpatient criteria for admission and is safe for discharge with outpatient follow-up.  Currently not displaying any evidence of active psychosis, auditory or visual hallucinations, suicidal or homicidal ideation and does not meet IVC criteria.  Patient discharged with outpatient follow-up   Additional history obtained: -External records from outside source obtained and reviewed including: Chart review including previous notes, labs, imaging, consultation notes      Medicines ordered and prescription drug management: Meds ordered this encounter  Medications   ARIPiprazole (ABILIFY) tablet 10 mg   traZODone (DESYREL) tablet 50 mg    -I have reviewed the patients home  medicines and have made adjustments as needed  Critical interventions none   Social Determinants of Health:  Factors impacting patients care include: none   Reevaluation: After the interventions noted above, I reevaluated the patient and found that they have :improved  Co morbidities that complicate the patient evaluation  Past Medical History:  Diagnosis Date   Bipolar 1 disorder (HCC)       Dispostion: I considered admission for this patient, but at this time does not meet inpatient criteria for admission and is safe for discharge outpatient follow-up     Final Clinical Impression(s) / ED Diagnoses Final diagnoses:  None     @PCDICTATION @    Harvest Stanco, Wyn Forster, MD 11/07/22 1520

## 2022-11-06 NOTE — Discharge Instructions (Signed)
I have refilled your medications.  Please pick up your medications and take as prescribed  Please follow-up with the behavioral health Hospital or your counselor  Return to ER if you have thoughts of harming yourself or others or hallucinations

## 2022-11-06 NOTE — ED Notes (Signed)
Patient verbalizes understanding of discharge instructions. Opportunity for questioning and answers were provided. Armband removed by staff, pt discharged from ED. Pt ambulatory to ED waiting room with steady gait.  

## 2022-11-06 NOTE — ED Triage Notes (Signed)
Pt states that he still has not been able to get his medications that were prescribed from previous visit and needs his daily dose and a refill called in to a different pharmacy
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
## 2022-11-11 ENCOUNTER — Emergency Department (HOSPITAL_COMMUNITY)
Admission: EM | Admit: 2022-11-11 | Discharge: 2022-11-12 | Disposition: A | Discharge status: Home / Self Care | Payer: 59 | Attending: Emergency Medicine | Admitting: Emergency Medicine

## 2022-11-11 ENCOUNTER — Other Ambulatory Visit: Payer: Self-pay

## 2022-11-11 ENCOUNTER — Encounter (HOSPITAL_COMMUNITY): Payer: Self-pay | Admitting: Emergency Medicine

## 2022-11-11 DIAGNOSIS — Z760 Encounter for issue of repeat prescription: Secondary | ICD-10-CM | POA: Diagnosis not present
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
## 2022-11-12 MED ORDER — ARIPIPRAZOLE 10 MG PO TABS: 10.0000 mg | ORAL_TABLET | Freq: Every day | ORAL | 0 refills | Status: DC

## 2022-11-12 MED ORDER — TRAZODONE HCL 50 MG PO TABS: 50.0000 mg | ORAL_TABLET | Freq: Every evening | ORAL | 0 refills | Status: DC | PRN

## 2022-11-12 MED ORDER — METOPROLOL SUCCINATE ER 25 MG PO TB24: 25.0000 mg | ORAL_TABLET | Freq: Every day | ORAL | 0 refills | Status: DC

## 2022-11-12 NOTE — Discharge Instructions (Signed)
You were seen today requesting medication refills.  You should attempt to get your original medications from your impounded car.
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
## 2022-11-12 NOTE — ED Notes (Signed)
ED Provider at bedside. 

## 2022-11-19 ENCOUNTER — Ambulatory Visit (HOSPITAL_COMMUNITY)
Admission: EM | Admit: 2022-11-19 | Discharge: 2022-11-20 | Disposition: A | Discharge status: Home / Self Care | Payer: 59 | Attending: Nurse Practitioner | Admitting: Nurse Practitioner

## 2022-11-19 DIAGNOSIS — F319 Bipolar disorder, unspecified: Secondary | ICD-10-CM

## 2022-11-19 MED ORDER — TRAZODONE HCL 100 MG PO TABS: 100.0000 mg | ORAL_TABLET | Freq: Every evening | ORAL | 0 refills | Status: DC | PRN

## 2022-11-19 NOTE — Discharge Instructions (Addendum)
  Discharge recommendations:  Patient is to take medications as prescribed. Please see information for follow-up appointment with psychiatry and therapy. Please follow up with your primary care provider for all medical related needs.  Start taking trazodone 100 mg for sleep as needed at night. Patient can follow up with his outpatient psychiatry at [facility] [place].  Therapy: We recommend that patient participate in individual therapy to address mental health concerns.  Medications: The patient or guardian is to contact a medical professional and/or outpatient provider to address any new side effects that develop. The patient or guardian should update outpatient providers of any new medications and/or medication changes.   Atypical antipsychotics: If you are prescribed an atypical antipsychotic, it is recommended that your height, weight, BMI, blood pressure, fasting lipid panel, and fasting blood sugar be monitored by your outpatient providers.  Safety:  The patient should abstain from use of illicit substances/drugs and abuse of any medications. If symptoms worsen or do not continue to improve or if the patient becomes actively suicidal or homicidal then it is recommended that the patient return to the closest hospital emergency department, the [facility], or call 911 for further evaluation and treatment. National Suicide Prevention Lifeline 1-800-SUICIDE or 289-810-2069.  About 988 988 offers 24/7 access to trained crisis counselors who can help people experiencing mental health-related distress. People can call or text 988 or chat 988lifeline.org for themselves or if they are worried about a loved one who may need crisis support.  Crisis Mobile: [facility]:                     919-396-5046 (for crisis response 24 hours a day) [facility] Hotline:                                            618-516-6439

## 2022-11-19 NOTE — Progress Notes (Signed)
   11/19/22 2216  [facility] Triage Screening (Walk-ins at [facility] only)  What Is the Reason for Your Visit/Call Today? Patient presents to [facility] by [facility]. Patient reports high levels of stress and not sleeping for 10 days. Patient reports a diagnosis of Bipolar and not taking his medications consistently. Patient reports needing medication and resources. Patient denies SI/HI at this time. Patient also denies AVH. Patient is routine.  How Long Has This Been Causing You Problems? 1 wk - 1 month  Have You Recently Had Any Thoughts About Hurting Yourself? No  Are You Planning to Commit Suicide/Harm Yourself At This time? No  Have you Recently Had Thoughts About Hurting Someone Karolee Ohs? No  Are You Planning To Harm Someone At This Time? No  Are you currently experiencing any auditory, visual or other hallucinations? No  Have You Used Any Alcohol or Drugs in the Past 24 Hours? No  Do you have any current medical co-morbidities that require immediate attention? Yes  Please describe current medical co-morbidities that require immediate attention: Dog buite, numb leg  Clinician description of patient physical appearance/behavior: Patient is fairly groomed and cooperative  What Do You Feel Would Help You the Most Today? Financial Resources;Housing Assistance;Stress Management;Social Support  If access to [facility] was not available, would you have sought care in the Emergency Department? Yes  Determination of Need Routine (7 days)  Options For Referral Mobile Crisis

## 2022-11-19 NOTE — ED Notes (Signed)
Patient denies HI/SI, calm and cooperative - taken to lobby area with some food while awaiting an assessment room

## 2022-11-19 NOTE — ED Provider Notes (Signed)
[facility] Medical Screening Exam  Patient Name: [name] MRN: [id number] Date of Evaluation: 11/20/22 Chief Complaint:  I cannot sleep Diagnosis:  Final diagnoses:  Bipolar I disorder (HCC)    History of Present illness: [name] is a 62 y.o. male presenting to the ED via [employer] for psychiatric evaluation. Patient states that he has been having difficulty sleeping at night.   Nurse practitioner assessed patient face-to-face and reviewed his chart.  Patient is alert oriented x 4, speech is clear and coherent, thought processes logical and goal-directed, mood is depressed with congruent affect.  Patient denies any SI/HI or AVH.  Patient does not appear to be responding to any internal or external stimuli at this time.  Patient denies any paranoia, delusions or any symptoms of mania. Patient reports that his mood is good.  Patient reports that he has been under a lot of financial stress lately because he has not worked in over a month.  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  Patient reports that he wants to apply for disability.   Patient reports that he is on Abilify 10 mg hydroxyzine 25 mg and trazodone 50 mg at metoprolol 25 mg. Will write a prescription trazodone 100 mg for 10 days. Patient reports that he is being followed by provider Lavinia Sharps at [facility] at the [facility]. Patient reports that he has an upcoming appointment with his outpatient provider Chales Abrahams Placey next week. Patient denies any SI/HI or AVH. Patient does not appear to be a danger to himself or others and patient is in agreement that he can be safely discharged at this time.   Flowsheet Row ED from 11/19/2022 in [facility] ED from 11/11/2022 in [facility] Emergency Department at [facility] ED from 11/06/2022 in [facility] Emergency Department at [facility]  C-SSRS RISK CATEGORY Error: Q3, 4, or 5 should not be  populated when Q2 is No Error: Question 6 not populated Error: Q3, 4, or 5 should not be populated when Q2 is No       Psychiatric Specialty Exam  Presentation  General Appearance:Casual  Eye Contact:Good  Speech:Clear and Coherent  Speech Volume:Normal  Handedness:Right   Mood and Affect  Mood: Euthymic  Affect: Appropriate   Thought Process  Thought Processes: Coherent  Descriptions of Associations:Intact  Orientation:Full (Time, Place and Person)  Thought Content:WDL  Diagnosis of Schizophrenia or Schizoaffective disorder in past: No   Hallucinations:None  Ideas of Reference:None  Suicidal Thoughts:No  Homicidal Thoughts:No   Sensorium  Memory: Immediate Fair; Recent Fair; Remote Fair  Judgment: Fair  Insight: Fair   Art therapist  Concentration: Fair  Attention Span: Fair  Recall: Fair  Fund of Knowledge: Good  Language: Good   Psychomotor Activity  Psychomotor Activity: Normal   Assets  Assets: Communication Skills; Desire for Improvement; Housing; Physical Health; Social Support   Sleep  Sleep: Poor  Number of hours:  3   Physical Exam: Physical Exam HENT:     Head: Normocephalic and atraumatic.     Nose: Nose normal.  Eyes:     Pupils: Pupils are equal, round, and reactive to light.  Cardiovascular:     Rate and Rhythm: Normal rate.  Pulmonary:     Effort: Pulmonary effort is normal.  Abdominal:     General: Abdomen is flat.  Musculoskeletal:        General: Normal range of motion.  Cervical back: Normal range of motion.  Skin:    General: Skin is warm.  Neurological:     Mental Status: He is alert and oriented to person, place, and time.  Psychiatric:        Attention and Perception: Attention normal.        Mood and Affect: Mood is anxious.        Speech: Speech normal.        Behavior: Behavior is cooperative.        Thought Content: Thought content normal.        Cognition and  Memory: Cognition normal.        Judgment: Judgment normal.   Review of Systems  Constitutional: Negative.   HENT: Negative.    Eyes: Negative.   Respiratory: Negative.    Cardiovascular: Negative.   Gastrointestinal: Negative.   Genitourinary: Negative.   Musculoskeletal: Negative.   Skin: Negative.   Neurological: Negative.   Endo/Heme/Allergies: Negative.   Psychiatric/Behavioral:  The patient has insomnia.    Blood pressure 120/76, pulse 84, temperature 98.7 F (37.1 C), temperature source Oral, resp. rate 18, SpO2 96%. There is no height or weight on file to calculate BMI.  Musculoskeletal: Strength & Muscle Tone: within normal limits Gait & Station: normal Patient leans: N/A   [facility] MSE Discharge Disposition for Follow up and Recommendations: Based on my evaluation the patient does not appear to have an emergency medical condition and can be discharged with resources and follow up care in outpatient services for Medication Management and Individual Therapy   Jasper Riling, NP 11/20/2022, 3:10 AM

## 2022-11-19 NOTE — ED Triage Notes (Signed)
Patient presents to [facility] by [facility]. Patient reports high levels of stress and not sleeping for 10 days. Patient reports a diagnosis of Bipolar and not taking his medications consistently. Patient reports needing medication and resources. Patient denies SI/HI at this time. Patient also denies AVH. Patient is routine.

## 2022-11-20 DIAGNOSIS — F319 Bipolar disorder, unspecified: Secondary | ICD-10-CM

## 2022-11-27 ENCOUNTER — Encounter (HOSPITAL_COMMUNITY): Payer: Self-pay

## 2022-11-27 ENCOUNTER — Emergency Department (HOSPITAL_COMMUNITY)
Admission: EM | Admit: 2022-11-27 | Discharge: 2022-11-27 | Disposition: A | Discharge status: Home / Self Care | Payer: 59 | Attending: Emergency Medicine | Admitting: Emergency Medicine

## 2022-11-27 DIAGNOSIS — Z5189 Encounter for other specified aftercare: Secondary | ICD-10-CM | POA: Diagnosis not present

## 2022-11-27 DIAGNOSIS — Z760 Encounter for issue of repeat prescription: Secondary | ICD-10-CM | POA: Diagnosis present

## 2022-11-27 MED ORDER — TRAZODONE HCL 100 MG PO TABS: 100.0000 mg | ORAL_TABLET | Freq: Every evening | ORAL | 0 refills | Status: DC | PRN

## 2022-11-27 NOTE — Discharge Instructions (Addendum)
Please follow-up with your primary care provider recent symptoms and ER visit.  Today your wound check does appear to be healing quite well and you may take Tylenol every 6 hours as needed for pain.  I have also refilled your trazodone however you will still need to follow-up with your primary care provider.  If symptoms change or worsen please return to ER.

## 2022-11-27 NOTE — ED Notes (Signed)
Pt states that he needs a medication refill, he has been out a few weeks. Pt also wants a dog bite checked from May 9th.

## 2022-11-27 NOTE — ED Triage Notes (Signed)
Reports right leg numbness due to "being bit by pitbull a few months ago".

## 2022-11-27 NOTE — ED Triage Notes (Signed)
Pt presents via EMS requesting refill of Trazodone. EMS reports pt has a rx at [facility] however is unable to get it. Ambulatory to triage.

## 2022-11-27 NOTE — ED Provider Notes (Signed)
[facility] EMERGENCY DEPARTMENT AT [facility] Provider Note   CSN: [id number] Arrival date & time: 11/27/22  0134     History  No chief complaint on file.   [name] is [name] 62 y.o. male presented for medication refill along with wound check.  Patient states he was bit by [name] pit bull back on Aug 24, 2022 and received his full course of rabies shots along with his tetanus.  Patient denies any drainage, redness in the area, numbness/tingling, fevers, warmth in the area or any other abnormalities.  Patient dates he wants to have this checked as per his attorney.  Patient states he is out of his trazodone and would like to have that refilled as well.  Patient states he does not have any other concerns at this time.  Patient denies chest pain, shortness of breath, abdominal pain, nausea/vomiting, change in sensation/motor skills, skin color changes, fevers, new onset weakness  Home Medications Prior to Admission medications   Medication Sig Start Date End Date Taking? Authorizing Provider  ARIPiprazole (ABILIFY) 10 MG tablet Take 1 tablet (10 mg total) by mouth daily. 11/12/22   Horton, Mayer Masker, MD  hydrOXYzine (ATARAX) 25 MG tablet Take 1 tablet (25 mg total) by mouth 3 (three) times daily as needed for anxiety. 11/06/22   Charlynne Pander, MD  metoprolol succinate (TOPROL-XL) 25 MG 24 hr tablet Take 1 tablet (25 mg total) by mouth daily. 11/12/22   Horton, Mayer Masker, MD  thiamine (VITAMIN B-1) 100 MG tablet Take 1 tablet (100 mg total) by mouth daily. 11/06/22   Kommor, Madison, MD  traZODone (DESYREL) 100 MG tablet Take 1 tablet (100 mg total) by mouth at bedtime as needed for up to 10 days for sleep. 11/27/22 12/07/22  Netta Corrigan, PA-C      Allergies    Patient has no known allergies.    Review of Systems   Review of Systems  Physical Exam Updated Vital Signs BP 111/74 (BP Location: Left Arm)   Pulse (!) 56   Temp 98 F (36.7 C) (Oral)   Resp 16   SpO2 100%   Physical Exam Exam conducted with [name] chaperone present ([name], Radiation protection practitioner).  Cardiovascular:     Rate and Rhythm: Normal rate.     Pulses: Normal pulses.  Musculoskeletal:        General: Normal range of motion.     Comments: Gait without issue Able to bear weight 5 out of 5 bilateral knee extension  Skin:    General: Skin is warm and dry.     Capillary Refill: Capillary refill takes less than 2 seconds.     Comments: Right medial thigh: 2 scabbed marks noted that appear to be healing quite well, no skin color changes, fluctuance, feeling of warmth, tenderness to palpation, no abnormalities palpated, no drainage  Neurological:     Mental Status: He is alert and oriented to person, place, and time.     Comments: Sensation intact distally     ED Results / Procedures / Treatments   Labs (all labs ordered are listed, but only abnormal results are displayed) Labs Reviewed - No data to display  EKG None  Radiology No results found.  Procedures Procedures    Medications Ordered in ED Medications - No data to display  ED Course/ Medical Decision Making/ [name]&P  Medical Decision Making  [name] 62 y.o. presented today for med refill, wound check. Working DDx that I considered at this time includes, but not limited to, medication refill, wound check, abscess, cellulitis, neurovascular compromise, sepsis.  R/o DDx: abscess, cellulitis, neurovascular compromise, sepsis: These are considered less likely due to history of present illness and physical exam findings  Review of prior external notes: 11/11/2022 ED  Unique Tests and My Interpretation: None  Discussion with Independent Historian: None  Discussion of Management of Tests: None  Risk: Medium: prescription drug management  Risk Stratification Score: None  Plan: On exam patient was in no acute distress stable vitals.  Patient physical exam with [name] chaperone show the  patient did have [name] scab Hakan on his right inner thigh from his reported dog bite that appears to be healing well without signs of infection.  Patient was neurovascularly intact and not endorsing any infectious symptoms.  Patient's vitals are all stable at this time and not suggesting any infections either.  Patient states he just wants to have his trazodone filled and leave after having his wound checked.  Upon chart review patient is prescribed trazodone and will be represcribed this medication and encouraged follow-up with his primary care provider.  Patient discharged at patient's request.  Patient was given return precautions. Patient stable for discharge at this time.  Patient verbalized understanding of plan.         Final Clinical Impression(s) / ED Diagnoses Final diagnoses:  Visit for wound check  Medication refill    Rx / DC Orders ED Discharge Orders          Ordered    traZODone (DESYREL) 100 MG tablet  At bedtime PRN        11/27/22 0639              Netta Corrigan, PA-C 11/27/22 0701    Tilden Fossa, MD 11/27/22 1728

## 2023-01-04 ENCOUNTER — Emergency Department (HOSPITAL_COMMUNITY): Payer: 59

## 2023-01-04 ENCOUNTER — Inpatient Hospital Stay (HOSPITAL_COMMUNITY)
Admission: EM | Admit: 2023-01-04 | Discharge: 2023-01-20 | DRG: 958 | Payer: 59 | Attending: Surgery | Admitting: Surgery

## 2023-01-04 DIAGNOSIS — R7989 Other specified abnormal findings of blood chemistry: Secondary | ICD-10-CM | POA: Diagnosis present

## 2023-01-04 DIAGNOSIS — Z23 Encounter for immunization: Secondary | ICD-10-CM

## 2023-01-04 DIAGNOSIS — S82421A Displaced transverse fracture of shaft of right fibula, initial encounter for closed fracture: Secondary | ICD-10-CM | POA: Diagnosis present

## 2023-01-04 DIAGNOSIS — D62 Acute posthemorrhagic anemia: Secondary | ICD-10-CM | POA: Diagnosis present

## 2023-01-04 DIAGNOSIS — F1010 Alcohol abuse, uncomplicated: Secondary | ICD-10-CM | POA: Diagnosis present

## 2023-01-04 DIAGNOSIS — I10 Essential (primary) hypertension: Secondary | ICD-10-CM | POA: Diagnosis present

## 2023-01-04 DIAGNOSIS — S82402B Unspecified fracture of shaft of left fibula, initial encounter for open fracture type I or II: Secondary | ICD-10-CM | POA: Diagnosis present

## 2023-01-04 DIAGNOSIS — S36115A Moderate laceration of liver, initial encounter: Secondary | ICD-10-CM | POA: Diagnosis present

## 2023-01-04 DIAGNOSIS — S36112A Contusion of liver, initial encounter: Secondary | ICD-10-CM

## 2023-01-04 DIAGNOSIS — Z6372 Alcoholism and drug addiction in family: Secondary | ICD-10-CM

## 2023-01-04 DIAGNOSIS — S0081XA Abrasion of other part of head, initial encounter: Secondary | ICD-10-CM | POA: Diagnosis present

## 2023-01-04 DIAGNOSIS — K7689 Other specified diseases of liver: Secondary | ICD-10-CM | POA: Diagnosis present

## 2023-01-04 DIAGNOSIS — S82202A Unspecified fracture of shaft of left tibia, initial encounter for closed fracture: Secondary | ICD-10-CM | POA: Diagnosis not present

## 2023-01-04 DIAGNOSIS — S27321A Contusion of lung, unilateral, initial encounter: Secondary | ICD-10-CM | POA: Diagnosis present

## 2023-01-04 DIAGNOSIS — S82141A Displaced bicondylar fracture of right tibia, initial encounter for closed fracture: Secondary | ICD-10-CM | POA: Diagnosis present

## 2023-01-04 DIAGNOSIS — S82202B Unspecified fracture of shaft of left tibia, initial encounter for open fracture type I or II: Principal | ICD-10-CM

## 2023-01-04 DIAGNOSIS — S82142C Displaced bicondylar fracture of left tibia, initial encounter for open fracture type IIIA, IIIB, or IIIC: Principal | ICD-10-CM | POA: Diagnosis present

## 2023-01-04 DIAGNOSIS — F3175 Bipolar disorder, in partial remission, most recent episode depressed: Secondary | ICD-10-CM

## 2023-01-04 DIAGNOSIS — S82209A Unspecified fracture of shaft of unspecified tibia, initial encounter for closed fracture: Secondary | ICD-10-CM

## 2023-01-04 DIAGNOSIS — S51811A Laceration without foreign body of right forearm, initial encounter: Secondary | ICD-10-CM | POA: Diagnosis present

## 2023-01-04 DIAGNOSIS — S82201A Unspecified fracture of shaft of right tibia, initial encounter for closed fracture: Secondary | ICD-10-CM | POA: Diagnosis not present

## 2023-01-04 DIAGNOSIS — F17290 Nicotine dependence, other tobacco product, uncomplicated: Secondary | ICD-10-CM | POA: Diagnosis present

## 2023-01-04 DIAGNOSIS — M79605 Pain in left leg: Secondary | ICD-10-CM | POA: Diagnosis present

## 2023-01-04 DIAGNOSIS — Z811 Family history of alcohol abuse and dependence: Secondary | ICD-10-CM

## 2023-01-04 DIAGNOSIS — Y92410 Unspecified street and highway as the place of occurrence of the external cause: Secondary | ICD-10-CM

## 2023-01-04 DIAGNOSIS — F419 Anxiety disorder, unspecified: Secondary | ICD-10-CM | POA: Diagnosis present

## 2023-01-04 DIAGNOSIS — F418 Other specified anxiety disorders: Secondary | ICD-10-CM | POA: Diagnosis not present

## 2023-01-04 DIAGNOSIS — Z79899 Other long term (current) drug therapy: Secondary | ICD-10-CM

## 2023-01-04 DIAGNOSIS — S27329A Contusion of lung, unspecified, initial encounter: Secondary | ICD-10-CM

## 2023-01-04 HISTORY — DX: Unspecified fracture of shaft of unspecified tibia, initial encounter for closed fracture: S82.209A

## 2023-01-04 LAB — CBC
HCT: 34.1 % — ABNORMAL LOW (ref 39.0–52.0)
Hemoglobin: 10.9 g/dL — ABNORMAL LOW (ref 13.0–17.0)
MCH: 29.4 pg (ref 26.0–34.0)
MCHC: 32 g/dL (ref 30.0–36.0)
MCV: 91.9 fL (ref 80.0–100.0)
Platelets: 298 10*3/uL (ref 150–400)
RBC: 3.71 MIL/uL — ABNORMAL LOW (ref 4.22–5.81)
RDW: 12.5 % (ref 11.5–15.5)
WBC: 12.7 10*3/uL — ABNORMAL HIGH (ref 4.0–10.5)
nRBC: 0 % (ref 0.0–0.2)

## 2023-01-04 LAB — I-STAT CG4 LACTIC ACID, ED: Lactic Acid, Venous: 2.7 mmol/L (ref 0.5–1.9)

## 2023-01-04 LAB — I-STAT CHEM 8, ED
BUN: 14 mg/dL (ref 6–20)
Calcium, Ion: 1.19 mmol/L (ref 1.15–1.40)
Chloride: 112 mmol/L — ABNORMAL HIGH (ref 98–111)
Creatinine, Ser: 1.6 mg/dL — ABNORMAL HIGH (ref 0.61–1.24)
Glucose, Bld: 113 mg/dL — ABNORMAL HIGH (ref 70–99)
HCT: 32 % — ABNORMAL LOW (ref 39.0–52.0)
Hemoglobin: 10.9 g/dL — ABNORMAL LOW (ref 13.0–17.0)
Potassium: 3.7 mmol/L (ref 3.5–5.1)
Sodium: 142 mmol/L (ref 135–145)
TCO2: 16 mmol/L — ABNORMAL LOW (ref 22–32)

## 2023-01-04 LAB — SAMPLE TO BLOOD BANK

## 2023-01-04 LAB — COMPREHENSIVE METABOLIC PANEL
ALT: 79 U/L — ABNORMAL HIGH (ref 0–44)
AST: 141 U/L — ABNORMAL HIGH (ref 15–41)
Albumin: 3.7 g/dL (ref 3.5–5.0)
Alkaline Phosphatase: 67 U/L (ref 38–126)
Anion gap: 10 (ref 5–15)
BUN: 14 mg/dL (ref 6–20)
CO2: 16 mmol/L — ABNORMAL LOW (ref 22–32)
Calcium: 8.5 mg/dL — ABNORMAL LOW (ref 8.9–10.3)
Chloride: 113 mmol/L — ABNORMAL HIGH (ref 98–111)
Creatinine, Ser: 1.41 mg/dL — ABNORMAL HIGH (ref 0.61–1.24)
GFR, Estimated: 57 mL/min — ABNORMAL LOW (ref >60)
Glucose, Bld: 113 mg/dL — ABNORMAL HIGH (ref 70–99)
Potassium: 3.7 mmol/L (ref 3.5–5.1)
Sodium: 139 mmol/L (ref 135–145)
Total Bilirubin: 0.8 mg/dL (ref 0.3–1.2)
Total Protein: 6.6 g/dL (ref 6.5–8.1)

## 2023-01-04 LAB — PROTIME-INR
INR: 1.1 (ref 0.8–1.2)
Prothrombin Time: 14 seconds (ref 11.4–15.2)

## 2023-01-04 LAB — ETHANOL: Alcohol, Ethyl (B): 163 mg/dL — ABNORMAL HIGH (ref <10)

## 2023-01-04 MED ORDER — LORAZEPAM 2 MG/ML IJ SOLN: 1.0000 mg | INTRAMUSCULAR | Status: AC | PRN

## 2023-01-04 MED ORDER — HYDRALAZINE HCL 20 MG/ML IJ SOLN: 10.0000 mg | INTRAMUSCULAR | Status: DC | PRN

## 2023-01-04 MED ORDER — HYDROMORPHONE HCL 1 MG/ML IJ SOLN
1.0000 mg | Freq: Once | INTRAMUSCULAR | Status: AC
  Administered 2023-01-04: 1 mg via INTRAVENOUS
  Filled 2023-01-04: qty 1

## 2023-01-04 MED ORDER — FENTANYL CITRATE PF 50 MCG/ML IJ SOSY
50.0000 ug | PREFILLED_SYRINGE | Freq: Once | INTRAMUSCULAR | Status: AC
  Administered 2023-01-04: 50 ug via INTRAVENOUS
  Filled 2023-01-04: qty 1

## 2023-01-04 MED ORDER — LORAZEPAM 1 MG PO TABS: 1.0000 mg | ORAL_TABLET | ORAL | Status: AC | PRN

## 2023-01-04 MED ORDER — METOPROLOL TARTRATE 5 MG/5ML IV SOLN: 5.0000 mg | Freq: Four times a day (QID) | INTRAVENOUS | Status: DC | PRN

## 2023-01-04 MED ORDER — THIAMINE MONONITRATE 100 MG PO TABS
100.0000 mg | ORAL_TABLET | Freq: Every day | ORAL | Status: DC
  Administered 2023-01-06 – 2023-01-20 (×15): 100 mg via ORAL
  Filled 2023-01-04 (×15): qty 1

## 2023-01-04 MED ORDER — ONDANSETRON 4 MG PO TBDP: 4.0000 mg | ORAL_TABLET | Freq: Four times a day (QID) | ORAL | Status: DC | PRN

## 2023-01-04 MED ORDER — METHOCARBAMOL 1000 MG/10ML IJ SOLN
500.0000 mg | Freq: Three times a day (TID) | INTRAVENOUS | Status: DC
  Administered 2023-01-05: 500 mg via INTRAVENOUS
  Filled 2023-01-04: qty 500

## 2023-01-04 MED ORDER — OXYCODONE HCL 5 MG PO TABS: 5.0000 mg | ORAL_TABLET | ORAL | Status: DC | PRN

## 2023-01-04 MED ORDER — LACTATED RINGERS IV BOLUS
1000.0000 mL | Freq: Once | INTRAVENOUS | Status: AC
  Administered 2023-01-04: 1000 mL via INTRAVENOUS

## 2023-01-04 MED ORDER — ACETAMINOPHEN 500 MG PO TABS
1000.0000 mg | ORAL_TABLET | Freq: Four times a day (QID) | ORAL | Status: DC
  Administered 2023-01-05 – 2023-01-20 (×53): 1000 mg via ORAL
  Filled 2023-01-04 (×55): qty 2

## 2023-01-04 MED ORDER — CEFAZOLIN SODIUM-DEXTROSE 2-4 GM/100ML-% IV SOLN
2.0000 g | Freq: Once | INTRAVENOUS | Status: AC
  Administered 2023-01-04: 2 g via INTRAVENOUS

## 2023-01-04 MED ORDER — METHOCARBAMOL 500 MG PO TABS
500.0000 mg | ORAL_TABLET | Freq: Three times a day (TID) | ORAL | Status: DC
  Administered 2023-01-05: 500 mg via ORAL
  Filled 2023-01-04 (×2): qty 1

## 2023-01-04 MED ORDER — SODIUM CHLORIDE 0.9 % IV SOLN: INTRAVENOUS | Status: DC

## 2023-01-04 MED ORDER — FOLIC ACID 1 MG PO TABS
1.0000 mg | ORAL_TABLET | Freq: Every day | ORAL | Status: DC
  Administered 2023-01-06 – 2023-01-20 (×15): 1 mg via ORAL
  Filled 2023-01-04 (×15): qty 1

## 2023-01-04 MED ORDER — THIAMINE HCL 100 MG/ML IJ SOLN
100.0000 mg | Freq: Every day | INTRAMUSCULAR | Status: DC
  Filled 2023-01-04: qty 2

## 2023-01-04 MED ORDER — POLYETHYLENE GLYCOL 3350 17 G PO PACK: 17.0000 g | PACK | Freq: Every day | ORAL | Status: DC | PRN

## 2023-01-04 MED ORDER — DOCUSATE SODIUM 100 MG PO CAPS: 100.0000 mg | ORAL_CAPSULE | Freq: Two times a day (BID) | ORAL | Status: DC

## 2023-01-04 MED ORDER — ONDANSETRON HCL 4 MG/2ML IJ SOLN: 4.0000 mg | Freq: Four times a day (QID) | INTRAMUSCULAR | Status: DC | PRN

## 2023-01-04 MED ORDER — TETANUS-DIPHTH-ACELL PERTUSSIS 5-2.5-18.5 LF-MCG/0.5 IM SUSY
0.5000 mL | PREFILLED_SYRINGE | Freq: Once | INTRAMUSCULAR | Status: AC
  Administered 2023-01-04: 0.5 mL via INTRAMUSCULAR

## 2023-01-04 MED ORDER — IOHEXOL 350 MG/ML SOLN
60.0000 mL | Freq: Once | INTRAVENOUS | Status: AC | PRN
  Administered 2023-01-04: 60 mL via INTRAVENOUS

## 2023-01-04 MED ORDER — HYDROMORPHONE HCL 1 MG/ML IJ SOLN
1.0000 mg | INTRAMUSCULAR | Status: DC | PRN
  Administered 2023-01-05: 1 mg via INTRAVENOUS
  Filled 2023-01-04: qty 1

## 2023-01-04 MED ORDER — ADULT MULTIVITAMIN W/MINERALS CH
1.0000 | ORAL_TABLET | Freq: Every day | ORAL | Status: DC
  Administered 2023-01-06 – 2023-01-20 (×15): 1 via ORAL
  Filled 2023-01-04 (×15): qty 1

## 2023-01-04 MED ORDER — MELATONIN 3 MG PO TABS
3.0000 mg | ORAL_TABLET | Freq: Every evening | ORAL | Status: DC | PRN
  Administered 2023-01-06 – 2023-01-08 (×2): 3 mg via ORAL
  Filled 2023-01-04 (×3): qty 1
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
## 2023-01-04 NOTE — ED Notes (Signed)
Wet to dry dressing applied to right forearm per DO Valora Corporal

## 2023-01-05 ENCOUNTER — Inpatient Hospital Stay (HOSPITAL_COMMUNITY): Payer: 59

## 2023-01-05 ENCOUNTER — Encounter (HOSPITAL_COMMUNITY)
Admission: EM | Disposition: A | Discharge status: Other Acute Inpatient Hospital | Payer: Self-pay | Source: Home / Self Care

## 2023-01-05 ENCOUNTER — Other Ambulatory Visit: Payer: Self-pay

## 2023-01-05 ENCOUNTER — Encounter (HOSPITAL_COMMUNITY): Payer: Self-pay

## 2023-01-05 ENCOUNTER — Inpatient Hospital Stay (HOSPITAL_COMMUNITY): Payer: 59 | Admitting: Anesthesiology

## 2023-01-05 DIAGNOSIS — S82202A Unspecified fracture of shaft of left tibia, initial encounter for closed fracture: Secondary | ICD-10-CM | POA: Diagnosis not present

## 2023-01-05 DIAGNOSIS — S82201A Unspecified fracture of shaft of right tibia, initial encounter for closed fracture: Secondary | ICD-10-CM

## 2023-01-05 DIAGNOSIS — F418 Other specified anxiety disorders: Secondary | ICD-10-CM

## 2023-01-05 HISTORY — PX: I & D EXTREMITY: SHX5045

## 2023-01-05 HISTORY — PX: ORIF TIBIA FRACTURE: SHX5416

## 2023-01-05 HISTORY — PX: TIBIA IM NAIL INSERTION: SHX2516

## 2023-01-05 LAB — BASIC METABOLIC PANEL
Anion gap: 11 (ref 5–15)
BUN: 15 mg/dL (ref 6–20)
CO2: 16 mmol/L — ABNORMAL LOW (ref 22–32)
Calcium: 8.6 mg/dL — ABNORMAL LOW (ref 8.9–10.3)
Chloride: 112 mmol/L — ABNORMAL HIGH (ref 98–111)
Creatinine, Ser: 1.36 mg/dL — ABNORMAL HIGH (ref 0.61–1.24)
GFR, Estimated: 60 mL/min — ABNORMAL LOW (ref >60)
Glucose, Bld: 128 mg/dL — ABNORMAL HIGH (ref 70–99)
Potassium: 4.2 mmol/L (ref 3.5–5.1)
Sodium: 139 mmol/L (ref 135–145)

## 2023-01-05 LAB — CBC
HCT: 27.6 % — ABNORMAL LOW (ref 39.0–52.0)
HCT: 18.9 % — ABNORMAL LOW (ref 39.0–52.0)
Hemoglobin: 8.9 g/dL — ABNORMAL LOW (ref 13.0–17.0)
Hemoglobin: 5.9 g/dL — CL (ref 13.0–17.0)
MCH: 29.8 pg (ref 26.0–34.0)
MCH: 29.5 pg (ref 26.0–34.0)
MCHC: 32.2 g/dL (ref 30.0–36.0)
MCHC: 31.2 g/dL (ref 30.0–36.0)
MCV: 92.3 fL (ref 80.0–100.0)
MCV: 94.5 fL (ref 80.0–100.0)
Platelets: 264 10*3/uL (ref 150–400)
Platelets: 203 10*3/uL (ref 150–400)
RBC: 2.99 MIL/uL — ABNORMAL LOW (ref 4.22–5.81)
RBC: 2 MIL/uL — ABNORMAL LOW (ref 4.22–5.81)
RDW: 12.7 % (ref 11.5–15.5)
RDW: 13.2 % (ref 11.5–15.5)
WBC: 22.4 10*3/uL — ABNORMAL HIGH (ref 4.0–10.5)
WBC: 14.7 10*3/uL — ABNORMAL HIGH (ref 4.0–10.5)
nRBC: 0 % (ref 0.0–0.2)
nRBC: 0 % (ref 0.0–0.2)

## 2023-01-05 LAB — VITAMIN D 25 HYDROXY (VIT D DEFICIENCY, FRACTURES): Vit D, 25-Hydroxy: 39.52 ng/mL (ref 30–100)

## 2023-01-05 LAB — HIV ANTIBODY (ROUTINE TESTING W REFLEX): HIV Screen 4th Generation wRfx: NONREACTIVE

## 2023-01-05 LAB — PREPARE RBC (CROSSMATCH)

## 2023-01-05 LAB — ABO/RH: ABO/RH(D): O NEG

## 2023-01-05 SURGERY — IRRIGATION AND DEBRIDEMENT EXTREMITY
Anesthesia: General | Site: Leg Upper | Laterality: Right

## 2023-01-05 MED ORDER — LIDOCAINE 2% (20 MG/ML) 5 ML SYRINGE
INTRAMUSCULAR | Status: DC | PRN
  Administered 2023-01-05: 100 mg via INTRAVENOUS

## 2023-01-05 MED ORDER — GLYCOPYRROLATE PF 0.2 MG/ML IJ SOSY
PREFILLED_SYRINGE | INTRAMUSCULAR | Status: AC
  Filled 2023-01-05: qty 1

## 2023-01-05 MED ORDER — PROPOFOL 10 MG/ML IV BOLUS
INTRAVENOUS | Status: DC | PRN
  Administered 2023-01-05: 120 mg via INTRAVENOUS

## 2023-01-05 MED ORDER — VANCOMYCIN HCL 1000 MG IV SOLR
INTRAVENOUS | Status: AC
  Filled 2023-01-05: qty 60

## 2023-01-05 MED ORDER — ROCURONIUM BROMIDE 10 MG/ML (PF) SYRINGE
PREFILLED_SYRINGE | INTRAVENOUS | Status: DC | PRN
  Administered 2023-01-05: 10 mg via INTRAVENOUS
  Administered 2023-01-05: 20 mg via INTRAVENOUS
  Administered 2023-01-05: 50 mg via INTRAVENOUS
  Administered 2023-01-05: 20 mg via INTRAVENOUS

## 2023-01-05 MED ORDER — HYDROMORPHONE HCL 1 MG/ML IJ SOLN
0.2500 mg | INTRAMUSCULAR | Status: DC | PRN
  Administered 2023-01-05: 0.5 mg via INTRAVENOUS

## 2023-01-05 MED ORDER — CHLORHEXIDINE GLUCONATE 0.12 % MT SOLN
OROMUCOSAL | Status: AC
  Filled 2023-01-05: qty 15

## 2023-01-05 MED ORDER — METHOCARBAMOL 1000 MG/10ML IJ SOLN
500.0000 mg | Freq: Four times a day (QID) | INTRAVENOUS | Status: DC | PRN
  Filled 2023-01-05: qty 5

## 2023-01-05 MED ORDER — PHENYLEPHRINE HCL-NACL 20-0.9 MG/250ML-% IV SOLN
INTRAVENOUS | Status: DC | PRN
  Administered 2023-01-05: 50 ug/min via INTRAVENOUS

## 2023-01-05 MED ORDER — FENTANYL CITRATE (PF) 250 MCG/5ML IJ SOLN
INTRAMUSCULAR | Status: DC | PRN
  Administered 2023-01-05: 150 ug via INTRAVENOUS
  Administered 2023-01-05: 100 ug via INTRAVENOUS

## 2023-01-05 MED ORDER — HYDROMORPHONE HCL 1 MG/ML IJ SOLN
INTRAMUSCULAR | Status: DC | PRN
Start: 2023-01-05 — End: 2023-01-05
  Administered 2023-01-05: .5 mg via INTRAVENOUS

## 2023-01-05 MED ORDER — METHOCARBAMOL 500 MG PO TABS
500.0000 mg | ORAL_TABLET | Freq: Four times a day (QID) | ORAL | Status: DC
  Administered 2023-01-05 – 2023-01-08 (×11): 500 mg via ORAL
  Filled 2023-01-05 (×11): qty 1

## 2023-01-05 MED ORDER — HYDROMORPHONE HCL 1 MG/ML IJ SOLN
INTRAMUSCULAR | Status: AC
  Filled 2023-01-05: qty 1

## 2023-01-05 MED ORDER — ONDANSETRON HCL 4 MG PO TABS: 4.0000 mg | ORAL_TABLET | Freq: Four times a day (QID) | ORAL | Status: DC | PRN

## 2023-01-05 MED ORDER — LACTATED RINGERS IV SOLN: INTRAVENOUS | Status: DC

## 2023-01-05 MED ORDER — OXYCODONE HCL 5 MG/5ML PO SOLN: 5.0000 mg | Freq: Once | ORAL | Status: DC | PRN

## 2023-01-05 MED ORDER — DEXAMETHASONE SODIUM PHOSPHATE 10 MG/ML IJ SOLN
INTRAMUSCULAR | Status: DC | PRN
  Administered 2023-01-05: 10 mg via INTRAVENOUS

## 2023-01-05 MED ORDER — SODIUM CHLORIDE 0.9% IV SOLUTION: Freq: Once | INTRAVENOUS | Status: DC

## 2023-01-05 MED ORDER — ARIPIPRAZOLE 10 MG PO TABS
10.0000 mg | ORAL_TABLET | Freq: Every day | ORAL | Status: DC
  Administered 2023-01-06 – 2023-01-20 (×15): 10 mg via ORAL
  Filled 2023-01-05 (×15): qty 1

## 2023-01-05 MED ORDER — HYDROMORPHONE HCL 1 MG/ML IJ SOLN: 1.0000 mg | INTRAMUSCULAR | Status: DC | PRN

## 2023-01-05 MED ORDER — PHENYLEPHRINE 80 MCG/ML (10ML) SYRINGE FOR IV PUSH (FOR BLOOD PRESSURE SUPPORT)
PREFILLED_SYRINGE | INTRAVENOUS | Status: AC
  Filled 2023-01-05: qty 20

## 2023-01-05 MED ORDER — POLYETHYLENE GLYCOL 3350 17 G PO PACK
17.0000 g | PACK | Freq: Every day | ORAL | Status: DC | PRN
  Administered 2023-01-08: 17 g via ORAL
  Filled 2023-01-05: qty 1

## 2023-01-05 MED ORDER — OXYCODONE HCL 5 MG PO TABS
5.0000 mg | ORAL_TABLET | ORAL | Status: DC | PRN
  Administered 2023-01-08 – 2023-01-09 (×4): 10 mg via ORAL
  Administered 2023-01-10: 5 mg via ORAL
  Administered 2023-01-12 – 2023-01-13 (×2): 10 mg via ORAL
  Administered 2023-01-14: 5 mg via ORAL
  Administered 2023-01-15 (×3): 10 mg via ORAL
  Administered 2023-01-19: 5 mg via ORAL
  Filled 2023-01-05 (×3): qty 2
  Filled 2023-01-05: qty 1
  Filled 2023-01-05 (×6): qty 2
  Filled 2023-01-05: qty 1
  Filled 2023-01-05 (×5): qty 2

## 2023-01-05 MED ORDER — METHYLENE BLUE (ANTIDOTE) 1 % IV SOLN
INTRAVENOUS | Status: AC
  Filled 2023-01-05: qty 10

## 2023-01-05 MED ORDER — ONDANSETRON HCL 4 MG/2ML IJ SOLN
INTRAMUSCULAR | Status: DC | PRN
  Administered 2023-01-05: 4 mg via INTRAVENOUS

## 2023-01-05 MED ORDER — TOBRAMYCIN SULFATE 1.2 G IJ SOLR: INTRAMUSCULAR | Status: DC | PRN

## 2023-01-05 MED ORDER — DEXAMETHASONE SODIUM PHOSPHATE 10 MG/ML IJ SOLN
INTRAMUSCULAR | Status: AC
  Filled 2023-01-05: qty 2

## 2023-01-05 MED ORDER — CEFAZOLIN SODIUM-DEXTROSE 2-4 GM/100ML-% IV SOLN
INTRAVENOUS | Status: AC
  Filled 2023-01-05: qty 100

## 2023-01-05 MED ORDER — MIDAZOLAM HCL 2 MG/2ML IJ SOLN
INTRAMUSCULAR | Status: DC | PRN
  Administered 2023-01-05 (×2): 1 mg via INTRAVENOUS

## 2023-01-05 MED ORDER — TOBRAMYCIN SULFATE 1.2 G IJ SOLR
INTRAMUSCULAR | Status: AC
  Filled 2023-01-05: qty 1.2

## 2023-01-05 MED ORDER — CHLORHEXIDINE GLUCONATE 0.12 % MT SOLN
15.0000 mL | Freq: Once | OROMUCOSAL | Status: AC
  Administered 2023-01-05: 15 mL via OROMUCOSAL

## 2023-01-05 MED ORDER — ONDANSETRON HCL 4 MG/2ML IJ SOLN: 4.0000 mg | Freq: Once | INTRAMUSCULAR | Status: DC | PRN

## 2023-01-05 MED ORDER — ROCURONIUM BROMIDE 10 MG/ML (PF) SYRINGE
PREFILLED_SYRINGE | INTRAVENOUS | Status: AC
  Filled 2023-01-05: qty 10

## 2023-01-05 MED ORDER — METOCLOPRAMIDE HCL 5 MG/ML IJ SOLN: 5.0000 mg | Freq: Three times a day (TID) | INTRAMUSCULAR | Status: DC | PRN

## 2023-01-05 MED ORDER — SODIUM CHLORIDE 0.9 % IV SOLN
2.0000 g | INTRAVENOUS | Status: AC
  Administered 2023-01-05 – 2023-01-07 (×3): 2 g via INTRAVENOUS
  Filled 2023-01-05 (×3): qty 20

## 2023-01-05 MED ORDER — OXYCODONE HCL 5 MG PO TABS
10.0000 mg | ORAL_TABLET | ORAL | Status: DC | PRN
  Administered 2023-01-06 – 2023-01-09 (×7): 15 mg via ORAL
  Administered 2023-01-10 (×3): 10 mg via ORAL
  Administered 2023-01-10: 15 mg via ORAL
  Administered 2023-01-11: 10 mg via ORAL
  Administered 2023-01-11: 15 mg via ORAL
  Administered 2023-01-11: 10 mg via ORAL
  Administered 2023-01-11: 15 mg via ORAL
  Administered 2023-01-12 (×3): 10 mg via ORAL
  Administered 2023-01-13 – 2023-01-14 (×3): 15 mg via ORAL
  Administered 2023-01-16 – 2023-01-17 (×3): 10 mg via ORAL
  Administered 2023-01-17: 15 mg via ORAL
  Administered 2023-01-18: 10 mg via ORAL
  Filled 2023-01-05: qty 2
  Filled 2023-01-05: qty 3
  Filled 2023-01-05 (×2): qty 2
  Filled 2023-01-05: qty 3
  Filled 2023-01-05: qty 2
  Filled 2023-01-05 (×2): qty 3
  Filled 2023-01-05: qty 2
  Filled 2023-01-05: qty 3
  Filled 2023-01-05: qty 2
  Filled 2023-01-05 (×3): qty 3
  Filled 2023-01-05: qty 2
  Filled 2023-01-05 (×4): qty 3
  Filled 2023-01-05: qty 2
  Filled 2023-01-05 (×4): qty 3
  Filled 2023-01-05: qty 2
  Filled 2023-01-05: qty 3

## 2023-01-05 MED ORDER — CEFAZOLIN SODIUM-DEXTROSE 2-3 GM-%(50ML) IV SOLR
INTRAVENOUS | Status: DC | PRN
Start: 2023-01-05 — End: 2023-01-05
  Administered 2023-01-05: 2 g via INTRAVENOUS

## 2023-01-05 MED ORDER — DOCUSATE SODIUM 100 MG PO CAPS
100.0000 mg | ORAL_CAPSULE | Freq: Two times a day (BID) | ORAL | Status: DC
  Administered 2023-01-05 – 2023-01-20 (×29): 100 mg via ORAL
  Filled 2023-01-05 (×29): qty 1

## 2023-01-05 MED ORDER — GLYCOPYRROLATE PF 0.2 MG/ML IJ SOSY
PREFILLED_SYRINGE | INTRAMUSCULAR | Status: DC | PRN
Start: 2023-01-05 — End: 2023-01-05
  Administered 2023-01-05 (×2): .1 mg via INTRAVENOUS

## 2023-01-05 MED ORDER — MIDAZOLAM HCL 2 MG/2ML IJ SOLN
INTRAMUSCULAR | Status: AC
  Filled 2023-01-05: qty 2

## 2023-01-05 MED ORDER — PHENYLEPHRINE 80 MCG/ML (10ML) SYRINGE FOR IV PUSH (FOR BLOOD PRESSURE SUPPORT)
PREFILLED_SYRINGE | INTRAVENOUS | Status: DC | PRN
  Administered 2023-01-05 (×5): 160 ug via INTRAVENOUS

## 2023-01-05 MED ORDER — ONDANSETRON HCL 4 MG/2ML IJ SOLN
4.0000 mg | Freq: Four times a day (QID) | INTRAMUSCULAR | Status: DC | PRN
  Administered 2023-01-05: 4 mg via INTRAVENOUS
  Filled 2023-01-05: qty 2

## 2023-01-05 MED ORDER — HYDROXYZINE HCL 25 MG PO TABS
25.0000 mg | ORAL_TABLET | Freq: Three times a day (TID) | ORAL | Status: DC | PRN
  Administered 2023-01-08 – 2023-01-11 (×6): 25 mg via ORAL
  Filled 2023-01-05 (×6): qty 1

## 2023-01-05 MED ORDER — METHYLENE BLUE (ANTIDOTE) 1 % IV SOLN
INTRAVENOUS | Status: DC | PRN
Start: 2023-01-05 — End: 2023-01-05
  Administered 2023-01-05: 1 mL

## 2023-01-05 MED ORDER — METHOCARBAMOL 1000 MG/10ML IJ SOLN
500.0000 mg | Freq: Four times a day (QID) | INTRAVENOUS | Status: DC
  Filled 2023-01-05: qty 5

## 2023-01-05 MED ORDER — HYDROMORPHONE HCL 1 MG/ML IJ SOLN
INTRAMUSCULAR | Status: AC
  Filled 2023-01-05: qty 0.5

## 2023-01-05 MED ORDER — VANCOMYCIN HCL 1000 MG IV SOLR
INTRAVENOUS | Status: DC | PRN
  Administered 2023-01-05: 1000 mg
  Administered 2023-01-05: 3000 mg

## 2023-01-05 MED ORDER — ONDANSETRON HCL 4 MG/2ML IJ SOLN
INTRAMUSCULAR | Status: AC
  Filled 2023-01-05: qty 4

## 2023-01-05 MED ORDER — LIDOCAINE 2% (20 MG/ML) 5 ML SYRINGE
INTRAMUSCULAR | Status: AC
  Filled 2023-01-05: qty 10

## 2023-01-05 MED ORDER — METOCLOPRAMIDE HCL 5 MG PO TABS: 5.0000 mg | ORAL_TABLET | Freq: Three times a day (TID) | ORAL | Status: DC | PRN

## 2023-01-05 MED ORDER — METHOCARBAMOL 500 MG PO TABS
500.0000 mg | ORAL_TABLET | Freq: Four times a day (QID) | ORAL | Status: DC | PRN
  Administered 2023-01-08 – 2023-01-17 (×11): 500 mg via ORAL
  Filled 2023-01-05 (×13): qty 1

## 2023-01-05 MED ORDER — ALBUMIN HUMAN 5 % IV SOLN: INTRAVENOUS | Status: DC | PRN

## 2023-01-05 MED ORDER — SODIUM CHLORIDE 0.9 % IR SOLN
Status: DC | PRN
Start: 2023-01-05 — End: 2023-01-05
  Administered 2023-01-05: 3000 mL

## 2023-01-05 MED ORDER — SUCCINYLCHOLINE CHLORIDE 200 MG/10ML IV SOSY
PREFILLED_SYRINGE | INTRAVENOUS | Status: DC | PRN
  Administered 2023-01-05: 120 mg via INTRAVENOUS

## 2023-01-05 MED ORDER — ORAL CARE MOUTH RINSE: 15.0000 mL | Freq: Once | OROMUCOSAL | Status: AC

## 2023-01-05 MED ORDER — OXYCODONE HCL 5 MG PO TABS: 5.0000 mg | ORAL_TABLET | ORAL | Status: DC | PRN

## 2023-01-05 MED ORDER — 0.9 % SODIUM CHLORIDE (POUR BTL) OPTIME
TOPICAL | Status: DC | PRN
  Administered 2023-01-05: 1000 mL

## 2023-01-05 MED ORDER — FENTANYL CITRATE (PF) 250 MCG/5ML IJ SOLN
INTRAMUSCULAR | Status: AC
  Filled 2023-01-05: qty 5

## 2023-01-05 MED ORDER — TRAZODONE HCL 50 MG PO TABS
100.0000 mg | ORAL_TABLET | Freq: Every evening | ORAL | Status: DC | PRN
  Administered 2023-01-08 – 2023-01-19 (×8): 100 mg via ORAL
  Filled 2023-01-05 (×2): qty 2
  Filled 2023-01-05: qty 1
  Filled 2023-01-05 (×3): qty 2
  Filled 2023-01-05: qty 1
  Filled 2023-01-05 (×2): qty 2

## 2023-01-05 MED ORDER — TRANEXAMIC ACID-NACL 1000-0.7 MG/100ML-% IV SOLN
1000.0000 mg | Freq: Once | INTRAVENOUS | Status: AC
  Administered 2023-01-05: 1000 mg via INTRAVENOUS
  Filled 2023-01-05: qty 100

## 2023-01-05 MED ORDER — OXYCODONE HCL 5 MG PO TABS: 5.0000 mg | ORAL_TABLET | Freq: Once | ORAL | Status: DC | PRN

## 2023-01-05 MED ORDER — TOBRAMYCIN SULFATE 1.2 G IJ SOLR
INTRAMUSCULAR | Status: DC | PRN
  Administered 2023-01-05: 1.2 g

## 2023-01-05 MED ORDER — PROPOFOL 10 MG/ML IV BOLUS
INTRAVENOUS | Status: AC
  Filled 2023-01-05: qty 20

## 2023-01-05 MED ORDER — VANCOMYCIN HCL 500 MG IV SOLR
INTRAVENOUS | Status: AC
  Filled 2023-01-05: qty 20

## 2023-01-05 SURGICAL SUPPLY — 119 items
APL PRP STRL LF DISP 70% ISPRP (MISCELLANEOUS)
BAG COUNTER SPONGE SURGICOUNT (BAG) ×4 IMPLANT
BAG SPNG CNTER NS LX DISP (BAG) ×3
BANDAGE ESMARK 6X9 LF (GAUZE/BANDAGES/DRESSINGS) IMPLANT
BIT DRILL CALIB QC 170X80 (BIT) IMPLANT
BIT DRILL LONG 4.2 (BIT) IMPLANT
BIT DRILL QC 2.5X240 (BIT) IMPLANT
BIT DRILL QC SFS 2.5X170 (BIT) IMPLANT
BIT DRILL SHORT 4.2 (BIT) IMPLANT
BLADE CLIPPER SURG (BLADE) IMPLANT
BLADE SURG 10 STRL SS (BLADE) ×8 IMPLANT
BNDG CMPR 5X3 KNIT ELC UNQ LF (GAUZE/BANDAGES/DRESSINGS) ×3
BNDG CMPR 5X4 KNIT ELC UNQ LF (GAUZE/BANDAGES/DRESSINGS) ×6
BNDG CMPR 6 X 5 YARDS HK CLSR (GAUZE/BANDAGES/DRESSINGS) ×6
BNDG CMPR 9X6 STRL LF SNTH (GAUZE/BANDAGES/DRESSINGS)
BNDG COHESIVE 4X5 TAN STRL (GAUZE/BANDAGES/DRESSINGS) ×4 IMPLANT
BNDG ELASTIC 3INX 5YD STR LF (GAUZE/BANDAGES/DRESSINGS) IMPLANT
BNDG ELASTIC 4INX 5YD STR LF (GAUZE/BANDAGES/DRESSINGS) IMPLANT
BNDG ELASTIC 4X5.8 VLCR STR LF (GAUZE/BANDAGES/DRESSINGS) ×4 IMPLANT
BNDG ELASTIC 6INX 5YD STR LF (GAUZE/BANDAGES/DRESSINGS) IMPLANT
BNDG ELASTIC 6X5.8 VLCR STR LF (GAUZE/BANDAGES/DRESSINGS) ×4 IMPLANT
BNDG ESMARK 6X9 LF (GAUZE/BANDAGES/DRESSINGS) IMPLANT
BNDG GAUZE DERMACEA FLUFF 4 (GAUZE/BANDAGES/DRESSINGS) ×8 IMPLANT
BNDG GZE DERMACEA 4 6PLY (GAUZE/BANDAGES/DRESSINGS)
BOWL SMART MIX CTS (DISPOSABLE) IMPLANT
BRUSH SCRUB EZ PLAIN DRY (MISCELLANEOUS) ×8 IMPLANT
CEMENT BONE SIMPLEX SPEEDSET (Cement) IMPLANT
CHLORAPREP W/TINT 26 (MISCELLANEOUS) ×4 IMPLANT
COVER MAYO STAND STRL (DRAPES) ×4 IMPLANT
COVER SURGICAL LIGHT HANDLE (MISCELLANEOUS) ×8 IMPLANT
CUFF TOURN SGL QUICK 34 (TOURNIQUET CUFF) ×3
CUFF TRNQT CYL 34X4.125X (TOURNIQUET CUFF) ×4 IMPLANT
DRAPE C-ARM 42X72 X-RAY (DRAPES) ×4 IMPLANT
DRAPE C-ARMOR (DRAPES) ×4 IMPLANT
DRAPE HALF SHEET 40X57 (DRAPES) ×8 IMPLANT
DRAPE IMP U-DRAPE 54X76 (DRAPES) ×8 IMPLANT
DRAPE INCISE IOBAN 66X45 STRL (DRAPES) IMPLANT
DRAPE ORTHO SPLIT 77X108 STRL (DRAPES) ×6
DRAPE SURG 17X23 STRL (DRAPES) ×4 IMPLANT
DRAPE SURG ORHT 6 SPLT 77X108 (DRAPES) ×8 IMPLANT
DRAPE U-SHAPE 47X51 STRL (DRAPES) ×4 IMPLANT
DRILL BIT SHORT 4.2 (BIT) ×6
DRSG ADAPTIC 3X8 NADH LF (GAUZE/BANDAGES/DRESSINGS) ×4 IMPLANT
DRSG MEPILEX POST OP 4X8 (GAUZE/BANDAGES/DRESSINGS) IMPLANT
DRSG MEPITEL 4X7.2 (GAUZE/BANDAGES/DRESSINGS) IMPLANT
DRSG MEPITEL 8X12 (GAUZE/BANDAGES/DRESSINGS) IMPLANT
ELECT REM PT RETURN 9FT ADLT (ELECTROSURGICAL) ×3 IMPLANT
ELECTRODE REM PT RTRN 9FT ADLT (ELECTROSURGICAL) ×4 IMPLANT
EVACUATOR 1/8 PVC DRAIN (DRAIN) IMPLANT
GAUZE PAD ABD 8X10 STRL (GAUZE/BANDAGES/DRESSINGS) ×16 IMPLANT
GAUZE SPONGE 4X4 12PLY STRL (GAUZE/BANDAGES/DRESSINGS) ×4 IMPLANT
GLOVE BIO SURGEON STRL SZ 6.5 (GLOVE) ×12 IMPLANT
GLOVE BIO SURGEON STRL SZ7.5 (GLOVE) ×16 IMPLANT
GLOVE BIOGEL PI IND STRL 6.5 (GLOVE) ×4 IMPLANT
GLOVE BIOGEL PI IND STRL 7.5 (GLOVE) ×4 IMPLANT
GLOVE XGUARD RR 2 7.5 (GLOVE) ×4 IMPLANT
GLOVE XGUARD RR2 7.5 (GLOVE) ×3
GOWN STRL REUS W/ TWL LRG LVL3 (GOWN DISPOSABLE) ×8 IMPLANT
GOWN STRL REUS W/TWL LRG LVL3 (GOWN DISPOSABLE) ×6
GUIDEWIRE 3.2X400 (WIRE) IMPLANT
HANDPIECE INTERPULSE COAX TIP (DISPOSABLE) ×3
K-WIRE 1.6X150 (WIRE) ×3 IMPLANT
KIT BASIN OR (CUSTOM PROCEDURE TRAY) ×4 IMPLANT
KIT TURNOVER KIT B (KITS) ×4 IMPLANT
KWIRE 1.6X150 (WIRE) IMPLANT
MANIFOLD NEPTUNE II (INSTRUMENTS) ×4 IMPLANT
NAIL TIB TFNA 9X330 (Nail) IMPLANT
NDL SUT .5 MAYO 1.404X.05X (NEEDLE) IMPLANT
NEEDLE MAYO TAPER (NEEDLE) ×3
NS IRRIG 1000ML POUR BTL (IV SOLUTION) ×4 IMPLANT
PACK ORTHO EXTREMITY (CUSTOM PROCEDURE TRAY) ×4 IMPLANT
PACK TOTAL JOINT (CUSTOM PROCEDURE TRAY) ×4 IMPLANT
PAD ABD 8X10 STRL (GAUZE/BANDAGES/DRESSINGS) IMPLANT
PAD ARMBOARD 7.5X6 YLW CONV (MISCELLANEOUS) ×8 IMPLANT
PAD CAST 3X4 CTTN HI CHSV (CAST SUPPLIES) IMPLANT
PAD CAST 4YDX4 CTTN HI CHSV (CAST SUPPLIES) ×4 IMPLANT
PADDING CAST COTTON 3X4 STRL (CAST SUPPLIES) ×3
PADDING CAST COTTON 4X4 STRL (CAST SUPPLIES) ×6
PADDING CAST COTTON 6X4 STRL (CAST SUPPLIES) ×4 IMPLANT
PLATE LOCK TIB VA-LCP 3.5X147 (Plate) IMPLANT
REAMER ROD DEEP FLUTE 2.5X950 (INSTRUMENTS) IMPLANT
SCREW CORT 3.5X42M SELF TAP (Screw) IMPLANT
SCREW CORTEX 3.5 45MM (Screw) IMPLANT
SCREW CORTEX 3.5X75MM (Screw) IMPLANT
SCREW LOCK 3.5X85 (Screw) IMPLANT
SCREW LOCK CORT ST 3.5X30 (Screw) IMPLANT
SCREW LOCK CORT ST 3.5X32 (Screw) IMPLANT
SCREW LOCK CORT ST 3.5X38 (Screw) IMPLANT
SCREW LOCK IM LP NAIL 5X80 (Screw) IMPLANT
SCREW LOCK IM NAIL 5X30 (Screw) IMPLANT
SCREW LOCK LP 5X34 (Screw) IMPLANT
SCREW LOCK LP 5X38 LT (Screw) IMPLANT
SCREW LOCK LP 5X70 (Screw) IMPLANT
SCREW LOCKING SLF TAP 3.5X75MM (Screw) IMPLANT
SCREW LOCKING SLF TAP 3.5X80MM (Screw) IMPLANT
SET HNDPC FAN SPRY TIP SCT (DISPOSABLE) IMPLANT
SPONGE T-LAP 18X18 ~~LOC~~+RFID (SPONGE) ×4 IMPLANT
STAPLER VISISTAT 35W (STAPLE) ×4 IMPLANT
SUCTION TUBE FRAZIER 10FR DISP (SUCTIONS) ×4 IMPLANT
SUCTION TUBE FRAZIER 12FR DISP (SUCTIONS) IMPLANT
SUT ETHILON 2 0 FS 18 (SUTURE) ×8 IMPLANT
SUT ETHILON 3 0 PS 1 (SUTURE) ×8 IMPLANT
SUT MNCRL AB 3-0 PS2 18 (SUTURE) ×4 IMPLANT
SUT MON AB 2-0 CT1 36 (SUTURE) ×4 IMPLANT
SUT PDS AB 0 CT 36 (SUTURE) IMPLANT
SUT VIC AB 0 CT1 27 (SUTURE) ×6
SUT VIC AB 0 CT1 27XBRD ANBCTR (SUTURE) ×4 IMPLANT
SUT VIC AB 1 CT1 18XCR BRD 8 (SUTURE) IMPLANT
SUT VIC AB 1 CT1 8-18 (SUTURE) ×3
SUT VIC AB 2-0 CT1 27 (SUTURE)
SUT VIC AB 2-0 CT1 TAPERPNT 27 (SUTURE) ×8 IMPLANT
SWAB CULTURE ESWAB REG 1ML (MISCELLANEOUS) IMPLANT
TOWEL GREEN STERILE (TOWEL DISPOSABLE) ×8 IMPLANT
TOWEL GREEN STERILE FF (TOWEL DISPOSABLE) ×4 IMPLANT
TRAY FOLEY MTR SLVR 16FR STAT (SET/KITS/TRAYS/PACK) IMPLANT
TUBE CONNECTING 12X1/4 (SUCTIONS) ×4 IMPLANT
UNDERPAD 30X36 HEAVY ABSORB (UNDERPADS AND DIAPERS) ×4 IMPLANT
WATER STERILE IRR 1000ML POUR (IV SOLUTION) ×8 IMPLANT
YANKAUER SUCT BULB TIP NO VENT (SUCTIONS) ×4 IMPLANT

## 2023-01-05 NOTE — Progress Notes (Signed)
PT Cancellation Note  Patient Details Name: [name] MRN: [id number] DOB: [birth year]   Cancelled Treatment:    Reason Eval/Treat Not Completed: Medical issues which prohibited therapy. Pt with bilateral LE fxs pending surgery. PT will follow up once surgery is complete and pt is appropriate to initiate mobilization.   Arlyss Gandy 01/05/2023, 8:02 AM

## 2023-01-05 NOTE — ED Notes (Signed)
Ortho tech and orthopedic surgery at bedside.

## 2023-01-05 NOTE — Anesthesia Procedure Notes (Signed)
Procedure Name: Intubation Date/Time: 01/05/2023 11:14 AM  Performed by: Marena Chancy, CRNAPre-anesthesia Checklist: Patient identified, Emergency Drugs available, Suction available and Patient being monitored Patient Re-evaluated:Patient Re-evaluated prior to induction Oxygen Delivery Method: Circle System Utilized Preoxygenation: Pre-oxygenation with 100% oxygen Induction Type: IV induction, Cricoid Pressure applied and Rapid sequence Laryngoscope Size: Miller and 2 Grade View: Grade II Tube type: Oral Tube size: 7.5 mm Number of attempts: 1 Airway Equipment and Method: Stylet and Oral airway Placement Confirmation: ETT inserted through vocal cords under direct vision, positive ETCO2 and breath sounds checked- equal and bilateral Tube secured with: Tape Dental Injury: Teeth and Oropharynx as per pre-operative assessment
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
## 2023-01-05 NOTE — Anesthesia Postprocedure Evaluation (Signed)
Anesthesia Post Note  Patient: [name]  Procedure(s) Performed: IRRIGATION AND DEBRIDEMENT RIGHT FOREARM (Right: Arm Lower) OPEN REDUCTION INTERNAL FIXATION (ORIF) TIBIA FRACTURE (Right: Leg Lower) INTRAMEDULLARY (IM) NAIL TIBIAL (Left: Leg Upper)     Patient location during evaluation: PACU Anesthesia Type: General Level of consciousness: awake and alert Pain management: pain level controlled Vital Signs Assessment: post-procedure vital signs reviewed and stable Respiratory status: spontaneous breathing, nonlabored ventilation, respiratory function stable and patient connected to nasal cannula oxygen Cardiovascular status: blood pressure returned to baseline and stable Postop Assessment: no apparent nausea or vomiting Anesthetic complications: no  No notable events documented.  Last Vitals:  Vitals:   01/05/23 1430 01/05/23 1445  BP: 127/66 120/68  Pulse: 76 89  Resp: 16 12  Temp:    SpO2: 100% 100%    Last Pain:  Vitals:   01/05/23 1445  TempSrc:   PainSc: 0-No pain                 Fronie Holstein S
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
## 2023-01-05 NOTE — ED Notes (Signed)
Called patients mother at home this morning to confirm if she had taken if of her sons belongings when he was brought to the hospital last night. She denies taking any of his stuff home with her.   Asked sec. To call [facility] Com to [facility] to see if they have any of patients belongings still

## 2023-01-05 NOTE — Interval H&P Note (Signed)
History and Physical Interval Note:  01/05/2023 10:18 AM  [name]  has presented today for surgery, with the diagnosis of bilateral tibia fractures.  The various methods of treatment have been discussed with the patient and family. After consideration of risks, benefits and other options for treatment, the patient has consented to  Procedure(s): IRRIGATION AND DEBRIDEMENT RIGHT FOREARM (Right) OPEN REDUCTION INTERNAL FIXATION (ORIF) TIBIA FRACTURE (N/A) INTRAMEDULLARY (IM) NAIL TIBIAL (N/A) as a surgical intervention.  The patient's history has been reviewed, patient examined, no change in status, stable for surgery.  I have reviewed the patient's chart and labs.  Questions were answered to the patient's satisfaction.     Caryn Bee P Azeez Dunker
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
## 2023-01-05 NOTE — Transfer of Care (Signed)
Immediate Anesthesia Transfer of Care Note  Patient: [name]  Procedure(s) Performed: IRRIGATION AND DEBRIDEMENT RIGHT FOREARM (Right: Arm Lower) OPEN REDUCTION INTERNAL FIXATION (ORIF) TIBIA FRACTURE (Right: Leg Lower) INTRAMEDULLARY (IM) NAIL TIBIAL (Left: Leg Upper)  Patient Location: PACU  Anesthesia Type:General  Level of Consciousness: awake, alert , and oriented  Airway & Oxygen Therapy: Patient Spontanous Breathing and Patient connected to nasal cannula oxygen  Post-op Assessment: Report given to RN and Post -op Vital signs reviewed and stable  Post vital signs: Reviewed and stable  Last Vitals:  Vitals Value Taken Time  BP 133/63 01/05/23 1427  Temp 36.7 C 01/05/23 1427  Pulse 80 01/05/23 1428  Resp 16 01/05/23 1428  SpO2 100 % 01/05/23 1428  Vitals shown include unfiled device data.  Last Pain:  Vitals:   01/05/23 0946  TempSrc: Oral  PainSc: 5       Patients Stated Pain Goal: 2 (01/05/23 0946)  Complications: No notable events documented.
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
## 2023-01-06 ENCOUNTER — Encounter (HOSPITAL_COMMUNITY): Payer: Self-pay | Admitting: Student

## 2023-01-06 ENCOUNTER — Other Ambulatory Visit: Payer: Self-pay

## 2023-01-06 LAB — BASIC METABOLIC PANEL
Anion gap: 8 (ref 5–15)
BUN: 32 mg/dL — ABNORMAL HIGH (ref 6–20)
CO2: 18 mmol/L — ABNORMAL LOW (ref 22–32)
Calcium: 8 mg/dL — ABNORMAL LOW (ref 8.9–10.3)
Chloride: 107 mmol/L (ref 98–111)
Creatinine, Ser: 1.8 mg/dL — ABNORMAL HIGH (ref 0.61–1.24)
GFR, Estimated: 43 mL/min — ABNORMAL LOW (ref >60)
Glucose, Bld: 157 mg/dL — ABNORMAL HIGH (ref 70–99)
Potassium: 4.5 mmol/L (ref 3.5–5.1)
Sodium: 133 mmol/L — ABNORMAL LOW (ref 135–145)

## 2023-01-06 LAB — CBC
HCT: 21.3 % — ABNORMAL LOW (ref 39.0–52.0)
Hemoglobin: 7.3 g/dL — ABNORMAL LOW (ref 13.0–17.0)
MCH: 30.5 pg (ref 26.0–34.0)
MCHC: 34.3 g/dL (ref 30.0–36.0)
MCV: 89.1 fL (ref 80.0–100.0)
Platelets: 154 10*3/uL (ref 150–400)
RBC: 2.39 MIL/uL — ABNORMAL LOW (ref 4.22–5.81)
RDW: 13.2 % (ref 11.5–15.5)
WBC: 12 10*3/uL — ABNORMAL HIGH (ref 4.0–10.5)
nRBC: 0 % (ref 0.0–0.2)

## 2023-01-06 LAB — HEMOGLOBIN AND HEMATOCRIT, BLOOD
HCT: 20.3 % — ABNORMAL LOW (ref 39.0–52.0)
HCT: 21.1 % — ABNORMAL LOW (ref 39.0–52.0)
Hemoglobin: 6.8 g/dL — CL (ref 13.0–17.0)
Hemoglobin: 7.2 g/dL — ABNORMAL LOW (ref 13.0–17.0)

## 2023-01-06 LAB — GLUCOSE, CAPILLARY: Glucose-Capillary: 135 mg/dL — ABNORMAL HIGH (ref 70–99)

## 2023-01-06 LAB — PREPARE RBC (CROSSMATCH)

## 2023-01-06 MED ORDER — SODIUM CHLORIDE 0.9% IV SOLUTION: Freq: Once | INTRAVENOUS | Status: AC

## 2023-01-06 NOTE — Progress Notes (Signed)
OT Cancellation Note  Patient Details Name: [name] MRN: [id number] DOB: [birth year]   Cancelled Treatment:    Reason Eval/Treat Not Completed: Medical issues which prohibited therapy Patient with hemoglobin of 6.8 and trending downward. OT will hold for evaluation until more medically stable.   Pollyann Glen E. Avrielle Fry, OTR/L Acute Rehabilitation Services [phone]   Cherlyn Cushing 01/06/2023, 9:16 AM

## 2023-01-06 NOTE — TOC CAGE-AID Note (Signed)
Transition of Care [facility]) - CAGE-AID Screening   Patient Details  Name: [name] MRN: [id number] Date of Birth: [birth year]  Transition of [facility]) CM/SW Contact:    Leota Sauers, RN Phone Number: 01/06/2023, 6:50 AM   Clinical Narrative:  Patient endorses alcohol use, denies use of illicit drugs. TOC consult for SA placed.  CAGE-AID Screening:    Have You Ever Felt You Ought to Cut Down on Your Drinking or Drug Use?: No Have People Annoyed You By Critizing Your Drinking Or Drug Use?: No Have You Felt Bad Or Guilty About Your Drinking Or Drug Use?: No Have You Ever Had a Drink or Used Drugs First Thing In The Morning to Steady Your Nerves or to Get Rid of a Hangover?: No CAGE-AID Score: 0     Substance abuse interventions: Other (must comment) (TOC consult for SA)

## 2023-01-06 NOTE — Progress Notes (Signed)
    Subjective: Patient reports pain as moderate.  Tolerating diet.  Urinating.   No CP, SOB.  Has not mobilized OOB yet due to low Hgb 6.8 requiring blood.  Objective:   VITALS:   Vitals:   01/06/23 0258 01/06/23 0300 01/06/23 0521 01/06/23 0900  BP: 133/70 124/75 (!) 143/73   Pulse: 74 74 83   Resp: (!) 21 19 13    Temp: 99.3 F (37.4 C) 98.8 F (37.1 C) 98.3 F (36.8 C) 98.9 F (37.2 C)  TempSrc: Oral Oral Oral Oral  SpO2: 97% 99% 100%   Weight:      Height:          Latest Ref Rng & Units 01/06/2023    7:26 AM 01/06/2023    4:45 AM 01/05/2023    6:19 PM  CBC  WBC 4.0 - 10.5 K/uL  12.0  14.7   Hemoglobin 13.0 - 17.0 g/dL 6.8  7.3  5.9   Hematocrit 39.0 - 52.0 % 20.3  21.3  18.9   Platelets 150 - 400 K/uL  154  203       Latest Ref Rng & Units 01/06/2023    4:45 AM 01/05/2023    2:51 AM 01/04/2023    9:33 PM  BMP  Glucose 70 - 99 mg/dL 166  063  016   BUN 6 - 20 mg/dL 32  15  14   Creatinine 0.61 - 1.24 mg/dL 0.10  9.32  3.55   Sodium 135 - 145 mmol/L 133  139  142   Potassium 3.5 - 5.1 mmol/L 4.5  4.2  3.7   Chloride 98 - 111 mmol/L 107  112  112   CO2 22 - 32 mmol/L 18  16    Calcium 8.9 - 10.3 mg/dL 8.0  8.6     Intake/Output      09/20 0701 09/21 0700 09/21 0701 09/22 0700   P.O.  240   I.V. (mL/kg) 2719.4 (35.3)    Blood 1286.7    IV Piggyback 300    Total Intake(mL/kg) 4306.1 (55.9) 240 (3.1)   Urine (mL/kg/hr) 150 (0.1) 300 (0.9)   Emesis/NG output 0    Total Output 150 300   Net +4156.1 -60        Urine Occurrence  1 x   Emesis Occurrence 1 x       Physical Exam: General: NAD.  Sitting up in bed, calm, comfortable Resp: No increased wob Cardio: regular rate and rhythm ABD soft Neurologically intact MSK Neurovascularly intact Sensation intact distally Intact pulses distally Able to move fingers right hand Can wiggle toes B/L feet Dorsiflexion/Plantar flexion intact B/L Incisions: dressing C/D/I Bledsoe to R knee Compartments soft  and compressible   Assessment: 1 Day Post-Op  S/P Procedure(s) (LRB): IRRIGATION AND DEBRIDEMENT RIGHT FOREARM (Right) OPEN REDUCTION INTERNAL FIXATION (ORIF) TIBIA FRACTURE (Right) INTRAMEDULLARY (IM) NAIL TIBIAL (Left) by Dr. Jena Gauss on 01/05/23  Principal Problem:   Tibia fracture   Plan:  Advance diet Up with therapy Incentive Spirometry Elevate and Apply ice  Weightbearing: WBAT RUE, WBAT LLE for transfers only, NWB RLE Insicional and dressing care: Dressings left intact until follow-up and Reinforce dressings as needed Orthopedic device(s): None Showering: Keep dressing dry VTE prophylaxis: Lovenox 40mg  qd  once cleared by trauma team , SCDs, ambulation Pain control: PRN  Contact information for today:  Margarita Rana MD, [facility] PA-C    [facility], [place] Office [phone] 01/06/2023, 11:09 AM

## 2023-01-06 NOTE — Progress Notes (Signed)
PT Cancellation Note  Patient Details Name: [name] MRN: [id number] DOB: April 07, 1961   Cancelled Treatment:    Reason Eval/Treat Not Completed: Medical issues which prohibited therapy. Hgb trending down. Now at 6.8.    Ilda Foil 01/06/2023, 9:18 AM
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
## 2023-01-06 NOTE — Plan of Care (Signed)

## 2023-01-07 LAB — TYPE AND SCREEN
ABO/RH(D): O NEG
Antibody Screen: NEGATIVE
Unit division: 0
Unit division: 0
Unit division: 0

## 2023-01-07 LAB — BASIC METABOLIC PANEL
Anion gap: 9 (ref 5–15)
BUN: 15 mg/dL (ref 6–20)
CO2: 21 mmol/L — ABNORMAL LOW (ref 22–32)
Calcium: 8.1 mg/dL — ABNORMAL LOW (ref 8.9–10.3)
Chloride: 107 mmol/L (ref 98–111)
Creatinine, Ser: 1 mg/dL (ref 0.61–1.24)
GFR, Estimated: >60 mL/min (ref >60)
Glucose, Bld: 122 mg/dL — ABNORMAL HIGH (ref 70–99)
Potassium: 3.7 mmol/L (ref 3.5–5.1)
Sodium: 137 mmol/L (ref 135–145)

## 2023-01-07 LAB — CBC
HCT: 21.3 % — ABNORMAL LOW (ref 39.0–52.0)
Hemoglobin: 7.1 g/dL — ABNORMAL LOW (ref 13.0–17.0)
MCH: 29.3 pg (ref 26.0–34.0)
MCHC: 33.3 g/dL (ref 30.0–36.0)
MCV: 88 fL (ref 80.0–100.0)
Platelets: 152 10*3/uL (ref 150–400)
RBC: 2.42 MIL/uL — ABNORMAL LOW (ref 4.22–5.81)
RDW: 14.5 % (ref 11.5–15.5)
WBC: 10.5 10*3/uL (ref 4.0–10.5)
nRBC: 0 % (ref 0.0–0.2)

## 2023-01-07 LAB — BPAM RBC
Blood Product Expiration Date: 202410042359
Blood Product Expiration Date: 202410072359
Blood Product Expiration Date: 202410142359
ISSUE DATE / TIME: 202409202210
ISSUE DATE / TIME: 202409211440
Unit Type and Rh: 9500
Unit Type and Rh: 9500
Unit Type and Rh: 9500
Unit Type and Rh: 9500

## 2023-01-07 MED ORDER — METOPROLOL SUCCINATE ER 25 MG PO TB24
25.0000 mg | ORAL_TABLET | Freq: Every day | ORAL | Status: DC
  Administered 2023-01-07 – 2023-01-20 (×14): 25 mg via ORAL
  Filled 2023-01-07 (×15): qty 1
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
## 2023-01-07 NOTE — Progress Notes (Signed)
Inpatient Rehab Admissions Coordinator Note:   Per therapy patient was screened for CIR candidacy by Jancarlos Thrun Luvenia Starch, CCC-SLP. At this time, pt appears to be a potential candidate for CIR. I will place an order for rehab consult for full assessment, per our protocol.  Please contact me any with questions.Wolfgang Phoenix, MS, CCC-SLP Admissions Coordinator [phone] 01/07/23 2:39 PM

## 2023-01-07 NOTE — Progress Notes (Signed)
Subjective: Patient reports pain as moderate. Tolerating diet.  Urinating.   No CP, SOB.  Has mobilized OOB only a small amount with OT. Didn't work with PT/OT at all yesterday due to anemia and getting blood. Hgb still low today.  Objective:   VITALS:   Vitals:   01/06/23 2013 01/06/23 2311 01/07/23 0344 01/07/23 0835  BP: 125/62 131/65 (!) 140/80 (!) 148/68  Pulse: 84 72 67 78  Resp: 14 18 13 20   Temp:  98.7 F (37.1 C) 98.4 F (36.9 C) 98.3 F (36.8 C)  TempSrc:  Oral Oral Oral  SpO2: 98% 96% 99% 99%  Weight:      Height:          Latest Ref Rng & Units 01/07/2023   11:57 AM 01/06/2023    6:29 PM 01/06/2023    7:26 AM  CBC  WBC 4.0 - 10.5 K/uL 10.5     Hemoglobin 13.0 - 17.0 g/dL 7.1  7.2  6.8   Hematocrit 39.0 - 52.0 % 21.3  21.1  20.3   Platelets 150 - 400 K/uL 152         Latest Ref Rng & Units 01/07/2023   11:57 AM 01/06/2023    4:45 AM 01/05/2023    2:51 AM  BMP  Glucose 70 - 99 mg/dL 161  096  045   BUN 6 - 20 mg/dL 15  32  15   Creatinine 0.61 - 1.24 mg/dL 4.09  8.11  9.14   Sodium 135 - 145 mmol/L 137  133  139   Potassium 3.5 - 5.1 mmol/L 3.7  4.5  4.2   Chloride 98 - 111 mmol/L 107  107  112   CO2 22 - 32 mmol/L 21  18  16    Calcium 8.9 - 10.3 mg/dL 8.1  8.0  8.6    Intake/Output      09/21 0701 09/22 0700 09/22 0701 09/23 0700   P.O. 480 720   I.V. (mL/kg) 37.8 (0.5)    Blood 300    IV Piggyback 100    Total Intake(mL/kg) 917.8 (11.9) 720 (9.3)   Urine (mL/kg/hr) 2950 (1.6) 1700 (2.9)   Emesis/NG output     Total Output 2950 1700   Net -2032.2 -980        Urine Occurrence 1 x       Physical Exam: General: NAD.  Sitting up in bed, calm, comfortable  Resp: No increased wob Cardio: regular rate and rhythm ABD soft Neurologically intact MSK Neurovascularly intact Sensation intact distally Intact pulses distally Able to move fingers right hand Can wiggle toes B/L feet Dorsiflexion/Plantar flexion intact B/L Incisions: dressing  C/D/I Bledsoe to R knee CAM boot to L leg Compartments soft and compressible   Assessment: 2 Days Post-Op  S/P Procedure(s) (LRB): IRRIGATION AND DEBRIDEMENT RIGHT FOREARM (Right) OPEN REDUCTION INTERNAL FIXATION (ORIF) TIBIA FRACTURE (Right) INTRAMEDULLARY (IM) NAIL TIBIAL (Left) by Dr. Jena Gauss on 01/05/23  Principal Problem:   Tibia fracture   Plan:  Advance diet Up with therapy Incentive Spirometry Elevate and Apply ice  Weightbearing: WBAT RUE, WBAT LLE for transfers only, NWB RLE Insicional and dressing care: Dressings left intact until follow-up and Reinforce dressings as needed Orthopedic device(s): None Showering: Keep dressing dry VTE prophylaxis: Lovenox 40mg  qd  once cleared by trauma team , SCDs, ambulation Pain control: PRN  Contact information for today:  Margarita Rana MD, [facility] PA-C    Jenne Pane, [place] Office [phone]  01/07/2023, 2:30 PM

## 2023-01-08 DIAGNOSIS — S82202B Unspecified fracture of shaft of left tibia, initial encounter for open fracture type I or II: Secondary | ICD-10-CM

## 2023-01-08 DIAGNOSIS — S82402B Unspecified fracture of shaft of left fibula, initial encounter for open fracture type I or II: Secondary | ICD-10-CM | POA: Diagnosis not present

## 2023-01-08 DIAGNOSIS — F3175 Bipolar disorder, in partial remission, most recent episode depressed: Secondary | ICD-10-CM

## 2023-01-08 HISTORY — DX: Unspecified fracture of shaft of left tibia, initial encounter for open fracture type I or II: S82.202B

## 2023-01-08 MED ORDER — LITHIUM CARBONATE 300 MG PO CAPS
300.0000 mg | ORAL_CAPSULE | Freq: Every day | ORAL | Status: DC
  Administered 2023-01-08 – 2023-01-09 (×2): 300 mg via ORAL
  Filled 2023-01-08 (×3): qty 1

## 2023-01-08 MED ORDER — VITAMIN D 25 MCG (1000 UNIT) PO TABS
1000.0000 [IU] | ORAL_TABLET | Freq: Every day | ORAL | Status: DC
  Administered 2023-01-08 – 2023-01-20 (×13): 1000 [IU] via ORAL
  Filled 2023-01-08 (×13): qty 1
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
## 2023-01-08 NOTE — Progress Notes (Signed)
Orthopaedic Trauma Progress Note  SUBJECTIVE: Doing okay this morning.  Notes pain is 5/10, manageable on current medication regimen.  Denies any significant numbness or tingling throughout bilateral lower extremities.  Was able to mobilize to bedside chair yesterday and stayed up for about 5 and half hours.  No chest pain. No SOB. No nausea/vomiting. No other complaints.  Tolerating diet and fluids.  OBJECTIVE:  Vitals:   01/07/23 2322 01/08/23 0749  BP: (!) 144/76 (!) 144/64  Pulse: 69 78  Resp: 20 20  Temp: 98.6 F (37 C) 98.2 F (36.8 C)  SpO2: 99% 98%    General: Sitting up in bed, no acute distress.  Pleasant and cooperative Respiratory: No increased work of breathing.  Right upper extremity: Dressing clean dry, dry, intact.  Tenderness to the forearm given large laceration.  Compartment soft compressible.  Nontender about the wrist or hand.  Able to wiggle fingers.  Motor and sensory function intact to the median, ulnar, radial nerve distributions.  Fingers warm well-perfused.  Nontender about the elbow.  Full painless elbow motion  Right lower extremity: Bledsoe brace in place.  Dressing clean, dry, intact.  Tenderness over the proximal tibia as expected.  Some swelling to the lower leg compartments are compressible.  Tolerates gentle ankle range of motion.  Actively dorsiflexion is fairly stiff.  Endorses sensation light touch over all aspects of the foot.  Toes warm and well-perfused.  Left lower extremity: Dressing clean, dry, intact.  No significant tenderness throughout the leg.  No calf tenderness.  Tolerates gentle ankle range of motion but dorsiflexion stiff actively.  Able to wiggle toes.  Toes warm well-perfused  IMAGING: Stable post op imaging.   LABS:  Results for orders placed or performed during the hospital encounter of 01/04/23 (from the past 24 hour(s))  CBC     Status: Abnormal   Collection Time: 01/07/23 11:57 AM  Result Value Ref Range   WBC 10.5 4.0 - 10.5  K/uL   RBC 2.42 (L) 4.22 - 5.81 MIL/uL   Hemoglobin 7.1 (L) 13.0 - 17.0 g/dL   HCT 41.6 (L) 60.6 - 30.1 %   MCV 88.0 80.0 - 100.0 fL   MCH 29.3 26.0 - 34.0 pg   MCHC 33.3 30.0 - 36.0 g/dL   RDW 60.1 09.3 - 23.5 %   Platelets 152 150 - 400 K/uL   nRBC 0.0 0.0 - 0.2 %  Basic metabolic panel     Status: Abnormal   Collection Time: 01/07/23 11:57 AM  Result Value Ref Range   Sodium 137 135 - 145 mmol/L   Potassium 3.7 3.5 - 5.1 mmol/L   Chloride 107 98 - 111 mmol/L   CO2 21 (L) 22 - 32 mmol/L   Glucose, Bld 122 (H) 70 - 99 mg/dL   BUN 15 6 - 20 mg/dL   Creatinine, Ser 5.73 0.61 - 1.24 mg/dL   Calcium 8.1 (L) 8.9 - 10.3 mg/dL   GFR, Estimated >22 >02 mL/min   Anion gap 9 5 - 15    ASSESSMENT: [name] is a 62 y.o. male, 3 Days Post-Op s/p IRRIGATION AND DEBRIDEMENT RIGHT FOREARM OPEN REDUCTION INTERNAL FIXATION RIGHT TIBIAL PLATEAU FRACTURE INTRAMEDULLARY NAIL LEFT TIBIA FRACTURE  CV/Blood loss: Acute blood loss anemia, Hgb 7.1 on 01/07/2023.  Patient has received a total of 3 units PRBCs.  Hemodynamically stable  PLAN: Weightbearing: NWB RLE.  WBAT RUE.  WBAT for transfers only LLE ROM: Okay for unrestricted range of motion BLE  and RUE Incisional and dressing care: Reinforce dressings as needed  Showering: Hold off on showering for now. Orthopedic device(s): CAM boot LLE when OOB.  Bledsoe brace RLE Pain management:  1. Tylenol 1000 mg q 6 hours scheduled 2. Robaxin 500 mg q 6 hours PRN 3. Oxycodone 5-15 mg q 4 hours PRN 4. Dilaudid 1 mg q 3 hours PRN VTE prophylaxis:  Chemical prophylaxis on hold until hemoglobin stable.  SCDs ID: Ceftriaxone postop per open fracture protocol Foley/Lines:  No foley, KVO IVFs Impediments to Fracture Healing: Vitamin D level 39, given polytrauma will start on vitamin D3 supplementation  Dispo: PT/OT evaluation ongoing, currently recommending CIR.  CIR admission coordinator has been consulted.  Will plan to remove Ace wrap and change  dressings to BLE/R UE on Wednesday, 01/10/2023.  Okay for discharge from ortho standpoint once cleared by medicine team and therapies  D/C recommendations: -Oxycodone, Robaxin for pain control -Eliquis 2.5 mg twice daily x 30 days for DVT prophylaxis -Continue 1000 units Vit D3 supplementation daily x 90 days  Follow - up plan: 2 weeks after discharge for wound check, repeat x-rays, suture removal   Contact information:  Truitt Merle MD, Thyra Breed PA-C. After hours and holidays please check [email or web address] for group call information for [facility]   Thompson Caul, PA-C [phone] (office) [email or web address]
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
## 2023-01-08 NOTE — Progress Notes (Signed)
Inpatient Rehab Admissions:  Inpatient Rehab Consult received.  I met with patient at the bedside for rehabilitation assessment and to discuss goals and expectations of an inpatient rehab admission.  Discussed average length of stay, insurance authorization requirement, discharge home after completion of CIR. Pt interested in pursuing CIR. Pt asked CIR to call mother while in room. Spoke with [name] on the speaker phone. Discussed CIR goals and expectations. She would prefer pt receive long-term rehab at a SNF. She is concerned about being able to provide an adequate amount of physical assistance that pt may warrant once at home.  TOC made aware.  Signed: Wolfgang Phoenix, MS, CCC-SLP Admissions Coordinator [phone]
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
## 2023-01-09 DIAGNOSIS — S82202B Unspecified fracture of shaft of left tibia, initial encounter for open fracture type I or II: Secondary | ICD-10-CM | POA: Diagnosis not present

## 2023-01-09 DIAGNOSIS — F3175 Bipolar disorder, in partial remission, most recent episode depressed: Secondary | ICD-10-CM | POA: Diagnosis not present

## 2023-01-09 DIAGNOSIS — S82402B Unspecified fracture of shaft of left fibula, initial encounter for open fracture type I or II: Secondary | ICD-10-CM | POA: Diagnosis not present

## 2023-01-09 LAB — BASIC METABOLIC PANEL
Anion gap: 7 (ref 5–15)
BUN: 13 mg/dL (ref 6–20)
CO2: 24 mmol/L (ref 22–32)
Calcium: 8.5 mg/dL — ABNORMAL LOW (ref 8.9–10.3)
Chloride: 103 mmol/L (ref 98–111)
Creatinine, Ser: 1.06 mg/dL (ref 0.61–1.24)
GFR, Estimated: >60 mL/min (ref >60)
Glucose, Bld: 129 mg/dL — ABNORMAL HIGH (ref 70–99)
Potassium: 3.8 mmol/L (ref 3.5–5.1)
Sodium: 134 mmol/L — ABNORMAL LOW (ref 135–145)

## 2023-01-09 LAB — CBC
HCT: 22.2 % — ABNORMAL LOW (ref 39.0–52.0)
Hemoglobin: 7.4 g/dL — ABNORMAL LOW (ref 13.0–17.0)
MCH: 30.1 pg (ref 26.0–34.0)
MCHC: 33.3 g/dL (ref 30.0–36.0)
MCV: 90.2 fL (ref 80.0–100.0)
Platelets: 201 10*3/uL (ref 150–400)
RBC: 2.46 MIL/uL — ABNORMAL LOW (ref 4.22–5.81)
RDW: 13.7 % (ref 11.5–15.5)
WBC: 9.2 10*3/uL (ref 4.0–10.5)
nRBC: 0 % (ref 0.0–0.2)

## 2023-01-09 MED ORDER — ENOXAPARIN SODIUM 30 MG/0.3ML IJ SOSY
30.0000 mg | PREFILLED_SYRINGE | Freq: Two times a day (BID) | INTRAMUSCULAR | Status: DC
  Administered 2023-01-09 – 2023-01-10 (×4): 30 mg via SUBCUTANEOUS
  Filled 2023-01-09 (×4): qty 0.3
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
## 2023-01-10 DIAGNOSIS — S82402B Unspecified fracture of shaft of left fibula, initial encounter for open fracture type I or II: Secondary | ICD-10-CM | POA: Diagnosis not present

## 2023-01-10 DIAGNOSIS — F3175 Bipolar disorder, in partial remission, most recent episode depressed: Secondary | ICD-10-CM | POA: Diagnosis not present

## 2023-01-10 DIAGNOSIS — S82202B Unspecified fracture of shaft of left tibia, initial encounter for open fracture type I or II: Secondary | ICD-10-CM | POA: Diagnosis not present

## 2023-01-10 LAB — COMPREHENSIVE METABOLIC PANEL
ALT: 31 U/L (ref 0–44)
AST: 38 U/L (ref 15–41)
Albumin: 2.7 g/dL — ABNORMAL LOW (ref 3.5–5.0)
Alkaline Phosphatase: 70 U/L (ref 38–126)
Anion gap: 15 (ref 5–15)
BUN: 19 mg/dL (ref 6–20)
CO2: 24 mmol/L (ref 22–32)
Calcium: 8.9 mg/dL (ref 8.9–10.3)
Chloride: 94 mmol/L — ABNORMAL LOW (ref 98–111)
Creatinine, Ser: 1.17 mg/dL (ref 0.61–1.24)
GFR, Estimated: >60 mL/min (ref >60)
Glucose, Bld: 129 mg/dL — ABNORMAL HIGH (ref 70–99)
Potassium: 4.3 mmol/L (ref 3.5–5.1)
Sodium: 133 mmol/L — ABNORMAL LOW (ref 135–145)
Total Bilirubin: 0.7 mg/dL (ref 0.3–1.2)
Total Protein: 5.7 g/dL — ABNORMAL LOW (ref 6.5–8.1)

## 2023-01-10 MED ORDER — LITHIUM CARBONATE 300 MG PO CAPS
300.0000 mg | ORAL_CAPSULE | Freq: Two times a day (BID) | ORAL | Status: DC
  Administered 2023-01-10 – 2023-01-12 (×4): 300 mg via ORAL
  Filled 2023-01-10 (×4): qty 1
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
## 2023-01-10 NOTE — TOC Progression Note (Signed)
Transition of Care [facility]) - Progression Note    Patient Details  Name: [name] MRN: [id number] Date of Birth: 11-Mar-1961  Transition [facility]) CM/SW Contact  Glennon Mac, RN Phone Number: 01/10/2023, 11:45am  Clinical Narrative:    Met with patient to discuss applying for Medicaid and disability.  Patient has insurance, so financial counseling will not be able to assist with Medicaid application.  Patient given printed copy of Medicaid application and information on where to apply for Social Security disability.  He is appreciative of this assistance.   Expected Discharge Plan: IP Rehab Facility Barriers to Discharge: Continued Medical Work up  Expected Discharge Plan and Services   Discharge Planning Services: CM Consult   Living arrangements for the past 2 months: Single Family Home                                       Social Determinants of Health (SDOH) Interventions SDOH Screenings   Food Insecurity: No Food Insecurity (01/06/2023)  Housing: Low Risk  (01/06/2023)  Transportation Needs: No Transportation Needs (01/06/2023)  Utilities: Not At Risk (01/06/2023)  Alcohol Screen: High Risk (10/28/2022)  Tobacco Use: High Risk (01/05/2023)    Readmission Risk Interventions     No data to display         Quintella Baton, RN, BSN  Trauma/Neuro ICU Case Manager [phone]
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
## 2023-01-11 MED ORDER — APIXABAN 2.5 MG PO TABS
2.5000 mg | ORAL_TABLET | Freq: Two times a day (BID) | ORAL | Status: DC
  Administered 2023-01-11 – 2023-01-20 (×18): 2.5 mg via ORAL
  Filled 2023-01-11 (×19): qty 1

## 2023-01-11 NOTE — Progress Notes (Signed)
Orthopedic Tech Progress Note Patient Details:  [name] 1960-07-10 [id number]  Ortho Devices Type of Ortho Device: Prafo boot/shoe Ortho Device/Splint Location: BLE Ortho Device/Splint Interventions: Ordered, Application  Pt up in chair so demonstrated donning of the right.  Left LE still had cam boot on.   Post Interventions Patient Tolerated: Well Instructions Provided: Adjustment of device  [name] OTR/L 01/11/2023, 5:09 PM

## 2023-01-11 NOTE — TOC Progression Note (Signed)
Transition of Care [facility]) - Progression Note    Patient Details  Name: [name] MRN: [id number] Date of Birth: 11/19/1960  Transition of Care [facility]) CM/SW Contact  Glennon Mac, RN Phone Number: 01/11/2023, 12:20pm  Clinical Narrative:    Met with patient to discuss options for rehab.  PT/OT recommending CIR, but patient's mother unable to provide adequate physical assistance at discharge.  Patient/family agreeable to SNF bed search; will initiate FL2 and fax out for bed offers.    Expected Discharge Plan: Skilled Nursing Facility Barriers to Discharge: Continued Medical Work up  Expected Discharge Plan and Services   Discharge Planning Services: CM Consult   Living arrangements for the past 2 months: Single Family Home                                       Social Determinants of Health (SDOH) Interventions SDOH Screenings   Food Insecurity: No Food Insecurity (01/06/2023)  Housing: Low Risk  (01/06/2023)  Transportation Needs: No Transportation Needs (01/06/2023)  Utilities: Not At Risk (01/06/2023)  Alcohol Screen: High Risk (10/28/2022)  Tobacco Use: High Risk (01/05/2023)    Readmission Risk Interventions     No data to display         Quintella Baton, RN, BSN  Trauma/Neuro ICU Case Manager [phone]

## 2023-01-11 NOTE — Progress Notes (Signed)
Orthopedic Tech Progress Note Patient Details:  [name] 12-Nov-1960 [id number]  Patient ID: [name], male   DOB: 1960-07-03, 62 y.o.   MRN: [id number] Overhead Trapeze applied to bed frame [name] OTR/L 01/11/2023, 11:27 AM
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
## 2023-01-11 NOTE — [place] FL2 (Signed)
[place] MEDICAID FL2 LEVEL OF CARE FORM     IDENTIFICATION  Patient Name: [name] Birthdate: [birth year] Sex: male Admission Date (Current Location): 01/04/2023  [facility] and [place] Number:  Producer, television/film/video and Address:  [facility], [address] [place], [place] [address]      Provider Number: [id number]  Attending Physician Name and Address:  Md, Trauma, MD  Relative Name and Phone Number:  [name], mother: phone, [phone]    Current Level of Care: Hospital Recommended Level of Care: Skilled Nursing Facility Prior Approval Number:    Date Approved/Denied:   PASRR Number: Pending  Discharge Plan: Home    Current Diagnoses: Patient Active Problem List   Diagnosis Date Noted   Bipolar disorder, in partial remission, most recent episode depressed (HCC) 01/08/2023   Type I or II open fracture of left tibia and fibula 01/08/2023   Tibia fracture 01/04/2023   Bipolar 1 disorder (HCC) 10/29/2022   Wernicke encephalopathy 10/29/2022   Alcohol use disorder 10/29/2022   Anxiety and depression    Bipolar affective disorder in remission (HCC) 01/16/2016   Cocaine use disorder, mild, abuse (HCC) 12/31/2015    Orientation RESPIRATION BLADDER Height & Weight     Self, Time, Situation, Place  Normal Continent Weight: 77.1 kg Height:  5\' 11"  (180.3 cm)  BEHAVIORAL SYMPTOMS/MOOD NEUROLOGICAL BOWEL NUTRITION STATUS      Continent Diet (Regular thin liquids)  AMBULATORY STATUS COMMUNICATION OF NEEDS Skin   Extensive Assist Verbally Bruising, Skin abrasions (bilateral arms/legs, face)                       Personal Care Assistance Level of Assistance  Bathing, Feeding, Dressing Bathing Assistance: Limited assistance Feeding assistance: Independent Dressing Assistance: Limited assistance     Functional Limitations Info             SPECIAL CARE FACTORS FREQUENCY  PT (By licensed PT), OT (By licensed OT)     PT  Frequency: 5x weekly OT Frequency: 5x weekly            Contractures Contractures Info: Not present    Additional Factors Info  Code Status, Allergies, Psychotropic Code Status Info: Full Allergies Info: No known allergies Psychotropic Info: Hx of bipolar disorder; on Abilify, Lithium, Trazodone         Current Medications (01/11/2023):  This is the current hospital active medication list Current Facility-Administered Medications  Medication Dose Route Frequency Provider Last Rate Last Admin   0.9 %  sodium chloride infusion (Manually program via Guardrails IV Fluids)   Intravenous Once Stechschulte, Hyman Hopes, MD       acetaminophen (TYLENOL) tablet 1,000 mg  1,000 mg Oral Q6H McClung, Sarah A, PA-C   1,000 mg at 01/11/23 1135   apixaban (ELIQUIS) tablet 2.5 mg  2.5 mg Oral BID Eric Form, PA-C       ARIPiprazole (ABILIFY) tablet 10 mg  10 mg Oral Daily Thyra Breed A, PA-C   10 mg at 01/11/23 0851   cholecalciferol (VITAMIN D3) 25 MCG (1000 UNIT) tablet 1,000 Units  1,000 Units Oral Daily West Bali, PA-C   1,000 Units at 01/11/23 5621   docusate sodium (COLACE) capsule 100 mg  100 mg Oral BID West Bali, PA-C   100 mg at 01/11/23 3086   folic acid (FOLVITE) tablet 1 mg  1 mg Oral Daily Thyra Breed A, PA-C   1 mg  at 01/11/23 0850   hydrALAZINE (APRESOLINE) injection 10 mg  10 mg Intravenous Q2H PRN West Bali, PA-C       HYDROmorphone (DILAUDID) injection 1 mg  1 mg Intravenous Q3H PRN West Bali, PA-C       hydrOXYzine (ATARAX) tablet 25 mg  25 mg Oral TID PRN West Bali, PA-C   25 mg at 01/11/23 1135   lithium carbonate capsule 300 mg  300 mg Oral BID WC Maryagnes Amos, FNP   300 mg at 01/11/23 0850   melatonin tablet 3 mg  3 mg Oral QHS PRN West Bali, PA-C   3 mg at 01/08/23 2055   methocarbamol (ROBAXIN) tablet 500 mg  500 mg Oral Q6H PRN West Bali, PA-C   500 mg at 01/11/23 4098   Or   methocarbamol (ROBAXIN) 500  mg in dextrose 5 % 50 mL IVPB  500 mg Intravenous Q6H PRN West Bali, PA-C       metoCLOPramide (REGLAN) tablet 5-10 mg  5-10 mg Oral Q8H PRN Sharon Seller, Sarah A, PA-C       Or   metoCLOPramide (REGLAN) injection 5-10 mg  5-10 mg Intravenous Q8H PRN West Bali, PA-C       metoprolol succinate (TOPROL-XL) 24 hr tablet 25 mg  25 mg Oral Daily Sophronia Simas L, MD   25 mg at 01/11/23 1135   metoprolol tartrate (LOPRESSOR) injection 5 mg  5 mg Intravenous Q6H PRN Sharon Seller, Sarah A, PA-C       multivitamin with minerals tablet 1 tablet  1 tablet Oral Daily West Bali, PA-C   1 tablet at 01/11/23 0850   ondansetron (ZOFRAN) tablet 4 mg  4 mg Oral Q6H PRN West Bali, PA-C       Or   ondansetron (ZOFRAN) injection 4 mg  4 mg Intravenous Q6H PRN West Bali, PA-C   4 mg at 01/05/23 2302   oxyCODONE (Oxy IR/ROXICODONE) immediate release tablet 10-15 mg  10-15 mg Oral Q4H PRN West Bali, PA-C   15 mg at 01/11/23 1134   oxyCODONE (Oxy IR/ROXICODONE) immediate release tablet 5-10 mg  5-10 mg Oral Q4H PRN West Bali, PA-C   5 mg at 01/10/23 1400   polyethylene glycol (MIRALAX / GLYCOLAX) packet 17 g  17 g Oral Daily PRN West Bali, PA-C   17 g at 01/08/23 1191   thiamine (VITAMIN B1) tablet 100 mg  100 mg Oral Daily West Bali, PA-C   100 mg at 01/11/23 4782   Or   thiamine (VITAMIN B1) injection 100 mg  100 mg Intravenous Daily West Bali, PA-C       traZODone (DESYREL) tablet 100 mg  100 mg Oral QHS PRN West Bali, PA-C   100 mg at 01/09/23 2348     Discharge Medications: Please see discharge summary for a list of discharge medications.  Relevant Imaging Results:  Relevant Lab Results:   Additional Information SSN: [id number]  Quintella Baton, RN, BSN  Trauma/Neuro ICU Case Manager [phone]

## 2023-01-12 LAB — CBC
HCT: 24 % — ABNORMAL LOW (ref 39.0–52.0)
Hemoglobin: 7.8 g/dL — ABNORMAL LOW (ref 13.0–17.0)
MCH: 29.9 pg (ref 26.0–34.0)
MCHC: 32.5 g/dL (ref 30.0–36.0)
MCV: 92 fL (ref 80.0–100.0)
Platelets: 359 10*3/uL (ref 150–400)
RBC: 2.61 MIL/uL — ABNORMAL LOW (ref 4.22–5.81)
RDW: 13.5 % (ref 11.5–15.5)
WBC: 15.1 10*3/uL — ABNORMAL HIGH (ref 4.0–10.5)
nRBC: 0 % (ref 0.0–0.2)

## 2023-01-12 LAB — LITHIUM LEVEL: Lithium Lvl: 0.29 mmol/L — ABNORMAL LOW (ref 0.60–1.20)

## 2023-01-12 MED ORDER — LITHIUM CARBONATE 300 MG PO CAPS
450.0000 mg | ORAL_CAPSULE | Freq: Two times a day (BID) | ORAL | Status: DC
  Administered 2023-01-12 – 2023-01-20 (×16): 450 mg via ORAL
  Filled 2023-01-12 (×17): qty 1
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
## 2023-01-12 NOTE — Progress Notes (Signed)
PT Cancellation Note  Patient Details Name: [name] MRN: [id number] DOB: November 04, 1960   Cancelled Treatment:    Reason Eval/Treat Not Completed: Patient declined, no reason specified (Pt just saw OT. States he has alot of pain and has not been able to eat yet. Will continue to follow up as able and appropriate. Encouraged to get out of bed with nursing staff as able.)  Harrel Carina, DPT, CLT  Acute Rehabilitation Services Office: [phone] (Secure chat preferred)   Claudia Desanctis 01/12/2023, 5:02 PM
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
## 2023-01-12 NOTE — Progress Notes (Signed)
PT Cancellation Note  Patient Details Name: [name] MRN: [id number] DOB: [birth year]   Cancelled Treatment:    Reason Eval/Treat Not Completed: Patient at procedure or test/unavailable (Attempted to see pt in PM and pt was not in room. Will continue to follow up as able and appropriate.)  Harrel Carina, DPT, CLT  Acute Rehabilitation Services Office: [phone] (Secure chat preferred)    Claudia Desanctis 01/12/2023, 4:10 PM

## 2023-01-12 NOTE — Progress Notes (Signed)
Patient transfer from 4N. Alert and oriented.

## 2023-01-12 NOTE — Progress Notes (Signed)
Report given and pt transported off unit via bed with belongings to the side to 5N25. P. Seleta Rhymes RN

## 2023-01-13 LAB — COMPREHENSIVE METABOLIC PANEL
ALT: 83 U/L — ABNORMAL HIGH (ref 0–44)
AST: 79 U/L — ABNORMAL HIGH (ref 15–41)
Albumin: 2.7 g/dL — ABNORMAL LOW (ref 3.5–5.0)
Alkaline Phosphatase: 127 U/L — ABNORMAL HIGH (ref 38–126)
Anion gap: 8 (ref 5–15)
BUN: 24 mg/dL — ABNORMAL HIGH (ref 6–20)
CO2: 25 mmol/L (ref 22–32)
Calcium: 8.9 mg/dL (ref 8.9–10.3)
Chloride: 101 mmol/L (ref 98–111)
Creatinine, Ser: 1.13 mg/dL (ref 0.61–1.24)
GFR, Estimated: >60 mL/min (ref >60)
Glucose, Bld: 114 mg/dL — ABNORMAL HIGH (ref 70–99)
Potassium: 4.1 mmol/L (ref 3.5–5.1)
Sodium: 134 mmol/L — ABNORMAL LOW (ref 135–145)
Total Bilirubin: 0.5 mg/dL (ref 0.3–1.2)
Total Protein: 6.1 g/dL — ABNORMAL LOW (ref 6.5–8.1)

## 2023-01-13 NOTE — Plan of Care (Signed)
  Problem: Activity: Goal: Risk for activity intolerance will decrease Outcome: Progressing   Problem: Coping: Goal: Level of anxiety will decrease Outcome: Progressing   Problem: Pain Managment: Goal: General experience of comfort will improve Outcome: Progressing   
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
## 2023-01-14 NOTE — Plan of Care (Signed)
  Problem: Activity: Goal: Risk for activity intolerance will decrease Outcome: Progressing   Problem: Nutrition: Goal: Adequate nutrition will be maintained Outcome: Progressing   Problem: Elimination: Goal: Will not experience complications related to bowel motility Outcome: Progressing   
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
## 2023-01-15 LAB — CBC
HCT: 23.9 % — ABNORMAL LOW (ref 39.0–52.0)
Hemoglobin: 7.7 g/dL — ABNORMAL LOW (ref 13.0–17.0)
MCH: 29.8 pg (ref 26.0–34.0)
MCHC: 32.2 g/dL (ref 30.0–36.0)
MCV: 92.6 fL (ref 80.0–100.0)
Platelets: 414 10*3/uL — ABNORMAL HIGH (ref 150–400)
RBC: 2.58 MIL/uL — ABNORMAL LOW (ref 4.22–5.81)
RDW: 13.4 % (ref 11.5–15.5)
WBC: 12.8 10*3/uL — ABNORMAL HIGH (ref 4.0–10.5)
nRBC: 0 % (ref 0.0–0.2)

## 2023-01-15 LAB — COMPREHENSIVE METABOLIC PANEL
ALT: 125 U/L — ABNORMAL HIGH (ref 0–44)
AST: 96 U/L — ABNORMAL HIGH (ref 15–41)
Albumin: 2.5 g/dL — ABNORMAL LOW (ref 3.5–5.0)
Alkaline Phosphatase: 157 U/L — ABNORMAL HIGH (ref 38–126)
Anion gap: 9 (ref 5–15)
BUN: 19 mg/dL (ref 6–20)
CO2: 23 mmol/L (ref 22–32)
Calcium: 9 mg/dL (ref 8.9–10.3)
Chloride: 103 mmol/L (ref 98–111)
Creatinine, Ser: 1.19 mg/dL (ref 0.61–1.24)
GFR, Estimated: >60 mL/min (ref >60)
Glucose, Bld: 110 mg/dL — ABNORMAL HIGH (ref 70–99)
Potassium: 4.1 mmol/L (ref 3.5–5.1)
Sodium: 135 mmol/L (ref 135–145)
Total Bilirubin: 0.5 mg/dL (ref 0.3–1.2)
Total Protein: 5.9 g/dL — ABNORMAL LOW (ref 6.5–8.1)

## 2023-01-15 LAB — LITHIUM LEVEL: Lithium Lvl: 0.59 mmol/L — ABNORMAL LOW (ref 0.60–1.20)
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
## 2023-01-15 NOTE — Plan of Care (Signed)
  Problem: Skin Integrity: Goal: Risk for impaired skin integrity will decrease Outcome: Progressing   Problem: Pain Managment: Goal: General experience of comfort will improve Outcome: Progressing   Problem: Activity: Goal: Risk for activity intolerance will decrease Outcome: Progressing

## 2023-01-15 NOTE — TOC Progression Note (Signed)
Transition of Care [facility]) - Progression Note    Patient Details  Name: [name] MRN: [id number] Date of Birth: 1961/02/20  [facility]) CM/SW Contact  Astrid Drafts Berna Spare, RN Phone Number: 01/15/2023, 11:42am  Clinical Narrative:    Patient currently has no bed offers.  Spoke with Whitney at [facility], who states she is reviewing the case currently and will get back to me.  Faxed patient information to a larger area, in hopes of getting a bed offer.   Expected Discharge Plan: Skilled Nursing Facility Barriers to Discharge: Continued Medical Work up  Expected Discharge Plan and Services   Discharge Planning Services: CM Consult   Living arrangements for the past 2 months: Single Family Home                                       Social Determinants of Health (SDOH) Interventions SDOH Screenings   Food Insecurity: No Food Insecurity (01/06/2023)  Housing: Low Risk  (01/06/2023)  Transportation Needs: No Transportation Needs (01/06/2023)  Utilities: Not At Risk (01/06/2023)  Alcohol Screen: High Risk (10/28/2022)  Tobacco Use: High Risk (01/05/2023)    Readmission Risk Interventions     No data to display         Quintella Baton, RN, BSN  Trauma/Neuro ICU Case Manager [phone]

## 2023-01-15 NOTE — Plan of Care (Signed)
  Problem: Education: Goal: Knowledge of General Education information will improve Description Including pain rating scale, medication(s)/side effects and non-pharmacologic comfort measures Outcome: Progressing   
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
## 2023-01-16 NOTE — Plan of Care (Signed)
  Problem: Clinical Measurements: Goal: Ability to maintain clinical measurements within normal limits will improve Outcome: Progressing Goal: Cardiovascular complication will be avoided Outcome: Progressing   Problem: Nutrition: Goal: Adequate nutrition will be maintained Outcome: Progressing   Problem: Pain Managment: Goal: General experience of comfort will improve Outcome: Progressing

## 2023-01-16 NOTE — Plan of Care (Signed)
  Problem: Activity: Goal: Risk for activity intolerance will decrease Outcome: Progressing   Problem: Nutrition: Goal: Adequate nutrition will be maintained Outcome: Progressing   Problem: Coping: Goal: Level of anxiety will decrease Outcome: Progressing   Problem: Pain Managment: Goal: General experience of comfort will improve Outcome: Progressing   

## 2023-01-16 NOTE — Plan of Care (Signed)
  Problem: Activity: Goal: Risk for activity intolerance will decrease Outcome: Progressing   
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
## 2023-01-18 NOTE — TOC CM/SW Note (Signed)
Patient is confined to one room and is unable to ambulate to the bathroom, therefore needing a commode at the bedside.    Reinaldo Raddle, RN, BSN  Trauma/Neuro ICU Case Manager [phone]

## 2023-01-18 NOTE — Plan of Care (Signed)
  Problem: Health Behavior/Discharge Planning: Goal: Ability to manage health-related needs will improve Outcome: Progressing   Problem: Clinical Measurements: Goal: Will remain free from infection Outcome: Progressing   Problem: Activity: Goal: Risk for activity intolerance will decrease Outcome: Progressing   Problem: Pain Managment: Goal: General experience of comfort will improve Outcome: Progressing   Problem: Safety: Goal: Ability to remain free from injury will improve Outcome: Progressing
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
## 2023-01-18 NOTE — TOC Progression Note (Signed)
Transition of Care [facility]) - Progression Note    Patient Details  Name: [name] MRN: [id number] Date of Birth: [birth year]  [facility] CM/SW Contact  Astrid Drafts Berna Spare, RN Phone Number: 01/18/2023, 4:45 PM  Clinical Narrative:    Patient continues to make progress with transfers, and expresses desire to go to his mother's house until he is able to bear weight and go to outpatient therapy.  He states that his brother will be able to build a ramp to assist with getting him in and out of the house.  We discussed recommended DME, to include wheelchair, drop arm bedside commode, hospital bed, and trapeze for hospital bed. Referral has been made to [employer] for recommended DME; hopeful for delivery of DME tomorrow.  Would recommend ambulance transport home, and patient is agreeable to this plan.  Will follow up on DME delivery tomorrow, and discharge to mother's home on Saturday.    Expected Discharge Plan: OP Rehab (when able to bear weight) Barriers to Discharge: Continued Medical Work up  Expected Discharge Plan and Services   Discharge Planning Services: CM Consult   Living arrangements for the past 2 months: Single Family Home                 DME Arranged: Hospital bed, Bedside commode, Trapeze, Wheelchair manual DME Agency: [employer] Date DME Agency Contacted: 01/18/23 Time DME Agency Contacted: [phone] Representative spoke with at DME Agency: Mickeal Needy             Social Determinants of Health (SDOH) Interventions SDOH Screenings   Food Insecurity: No Food Insecurity (01/06/2023)  Housing: Low Risk  (01/06/2023)  Transportation Needs: No Transportation Needs (01/06/2023)  Utilities: Not At Risk (01/06/2023)  Alcohol Screen: High Risk (10/28/2022)  Tobacco Use: High Risk (01/05/2023)    Readmission Risk Interventions     No data to display         Quintella Baton, RN, BSN  Trauma/Neuro ICU Case Manager [phone]
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
## 2023-01-19 ENCOUNTER — Inpatient Hospital Stay (HOSPITAL_COMMUNITY): Payer: 59

## 2023-01-19 MED ORDER — GERHARDT'S BUTT CREAM
TOPICAL_CREAM | Freq: Every day | CUTANEOUS | Status: DC | PRN
  Filled 2023-01-19: qty 1

## 2023-01-19 NOTE — Plan of Care (Signed)
Dressing change done to right arm.  Problem: Education: Goal: Knowledge of General Education information will improve Description: Including pain rating scale, medication(s)/side effects and non-pharmacologic comfort measures Outcome: Progressing   Problem: Health Behavior/Discharge Planning: Goal: Ability to manage health-related needs will improve Outcome: Progressing   Problem: Clinical Measurements: Goal: Ability to maintain clinical measurements within normal limits will improve Outcome: Progressing   Problem: Activity: Goal: Risk for activity intolerance will decrease Outcome: Progressing   Problem: Coping: Goal: Level of anxiety will decrease Outcome: Progressing   Problem: Pain Managment: Goal: General experience of comfort will improve Outcome: Progressing

## 2023-01-19 NOTE — TOC Progression Note (Signed)
Transition of Care [facility]) - Progression Note    Patient Details  Name: [name] MRN: [id number] Date of Birth: [birth year]  Transition of Care [facility]) CM/SW Contact  Astrid Drafts Berna Spare, RN Phone Number: 01/19/2023, 4:57 PM  Clinical Narrative:    Recommended DME to be delivered to mother's home today.  Plan dc home likely Saturday.  Will need ambulance transport home, and patient is agreeable with this plan.  Message left with weekend CSW to follow up for PTAR transport.    Expected Discharge Plan: OP Rehab (when able to bear weight) Barriers to Discharge: Continued Medical Work up  Expected Discharge Plan and Services   Discharge Planning Services: CM Consult   Living arrangements for the past 2 months: Single Family Home                 DME Arranged: Hospital bed, Bedside commode, Trapeze, Wheelchair manual DME Agency: [employer] Date DME Agency Contacted: 01/18/23 Time DME Agency Contacted: [phone] Representative spoke with at DME Agency: Mickeal Needy             Social Determinants of Health (SDOH) Interventions SDOH Screenings   Food Insecurity: No Food Insecurity (01/06/2023)  Housing: Low Risk  (01/06/2023)  Transportation Needs: No Transportation Needs (01/06/2023)  Utilities: Not At Risk (01/06/2023)  Alcohol Screen: High Risk (10/28/2022)  Tobacco Use: High Risk (01/05/2023)    Readmission Risk Interventions     No data to display         Quintella Baton, RN, BSN  Trauma/Neuro ICU Case Manager [phone]

## 2023-01-19 NOTE — Plan of Care (Signed)
  Problem: Health Behavior/Discharge Planning: Goal: Ability to manage health-related needs will improve Outcome: Progressing   Problem: Activity: Goal: Risk for activity intolerance will decrease Outcome: Progressing   
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
## 2023-01-20 ENCOUNTER — Telehealth: Payer: Self-pay | Admitting: Emergency Medicine

## 2023-01-20 MED ORDER — IBUPROFEN 800 MG PO TABS: 800.0000 mg | ORAL_TABLET | Freq: Three times a day (TID) | ORAL | 0 refills | Status: DC | PRN

## 2023-01-20 MED ORDER — OXYCODONE HCL 5 MG PO TABS: 5.0000 mg | ORAL_TABLET | Freq: Four times a day (QID) | ORAL | 0 refills | Status: DC | PRN
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
## 2023-01-20 NOTE — Telephone Encounter (Signed)
01/20/2023: 1312- Phone call received from floor nurse reporting that patient did not receive BSC. Spoke with patient and mother by phone. Confirms that they did not received the [facility]. Call to [employer] to inform of same, unfortunately due to signature on file from delivery invoice stating equipment received, they are unable to send out [facility]. Mother and patient aware, given option to purchase [facility] from DME store or [employer].

## 2023-01-20 NOTE — Plan of Care (Signed)

## 2023-01-20 NOTE — TOC Transition Note (Signed)
Transition of Care [facility]) - CM/SW Discharge Note   Patient Details  Name: [name] MRN: [id number] Date of Birth: 1961/04/03  [facility] CM/SW Contact:  Ronny Bacon, RN Phone Number: 01/20/2023, 10:00 AM   Clinical Narrative:  Patient is being discharged today. Spoke with patient by phone, no preference for outpatient therapy services. Referral # [id number] made for [facility] Outpatient therapy [address]. Information placed on pt AVS. PTAR transportation to be arranged.     Final next level of care: Home/Self Care Barriers to Discharge: No Barriers Identified   Patient Goals and CMS Choice      Discharge Placement                         Discharge Plan and Services Additional resources added to the After Visit Summary for     Discharge Planning Services: CM Consult            DME Arranged: Hospital bed, Bedside commode, Trapeze, Wheelchair manual DME Agency: [employer] Date DME Agency Contacted: 01/18/23 Time DME Agency Contacted: [phone] Representative spoke with at DME Agency: Mickeal Needy            Social Determinants of Health (SDOH) Interventions SDOH Screenings   Food Insecurity: No Food Insecurity (01/06/2023)  Housing: Low Risk  (01/06/2023)  Transportation Needs: No Transportation Needs (01/06/2023)  Utilities: Not At Risk (01/06/2023)  Alcohol Screen: High Risk (10/28/2022)  Tobacco Use: High Risk (01/05/2023)     Readmission Risk Interventions     No data to display

## 2023-01-20 NOTE — Progress Notes (Signed)
15 Days Post-Op   Subjective/Chief Complaint: Patient feels well and is ready to go home`   Objective: Vital signs in last 24 hours: Temp:  [98.5 F (36.9 C)-98.8 F (37.1 C)] 98.8 F (37.1 C) (10/04 2022) Pulse Rate:  [63-79] 63 (10/04 2022) Resp:  [18] 18 (10/04 2022) BP: (127-140)/(69-72) 140/72 (10/04 2022) SpO2:  [100 %] 100 % (10/04 2022) Last BM Date : 01/18/23  Intake/Output from previous day: 10/04 0701 - 10/05 0700 In: -  Out: 1550 [Urine:1550] Intake/Output this shift: No intake/output data recorded.   Pulm:  Nonlabored respirations Abd: Soft, ND, NT Ext:  splint to RLE, wounds on LLE are c/d/I with sutures, cdi dressing to right forearm  Psych: A&Ox4  Lab Results:  No results for input(s): "WBC", "HGB", "HCT", "PLT" in the last 72 hours. BMET No results for input(s): "NA", "K", "CL", "CO2", "GLUCOSE", "BUN", "CREATININE", "CALCIUM" in the last 72 hours. PT/INR No results for input(s): "LABPROT", "INR" in the last 72 hours. ABG No results for input(s): "PHART", "HCO3" in the last 72 hours.  Invalid input(s): "PCO2", "PO2"  Studies/Results: DG Tibia/Fibula Left Port  Result Date: 01/19/2023 CLINICAL DATA:  Left tibial and fibular fractures. EXAM: PORTABLE LEFT TIBIA AND FIBULA - 2 VIEW COMPARISON:  January 05, 2023. FINDINGS: Status post intramedullary rod fixation of comminuted tibial shaft fracture. Stable alignment of fracture components is noted. Stable comminuted and displaced proximal left fibular fracture is noted. IMPRESSION: Stable findings compared to prior exam. Electronically Signed   By: Lupita Raider M.D.   On: 01/19/2023 08:33   DG Knee Right Port  Result Date: 01/19/2023 CLINICAL DATA:  Right tibial and fibular fractures. EXAM: PORTABLE RIGHT KNEE - 1-2 VIEW COMPARISON:  January 05, 2023. FINDINGS: Status post surgical internal fixation of comminuted tibial plateau fracture. Lateral radiograph is obscured by external brace. Severely  displaced proximal right fibular fracture is again noted. IMPRESSION: Stable findings compared to prior exam. Electronically Signed   By: Lupita Raider M.D.   On: 01/19/2023 08:32    Anti-infectives: Anti-infectives (From admission, onward)    Start     Dose/Rate Route Frequency Ordered Stop   01/05/23 2000  cefTRIAXone (ROCEPHIN) 2 g in sodium chloride 0.9 % 100 mL IVPB        2 g 200 mL/hr over 30 Minutes Intravenous Every 24 hours 01/05/23 1850 01/07/23 2052   01/05/23 1355  tobramycin (NEBCIN) powder  Status:  Discontinued          As needed 01/05/23 1355 01/05/23 1422   01/05/23 1349  tobramycin (NEBCIN) powder  Status:  Discontinued          As needed 01/05/23 1350 01/05/23 1422   01/05/23 1214  vancomycin (VANCOCIN) powder  Status:  Discontinued          As needed 01/05/23 1214 01/05/23 1422   01/05/23 1042  ceFAZolin (ANCEF) 2-4 GM/100ML-% IVPB       Note to Pharmacy: Yvonne Kendall S: cabinet override      01/05/23 1042 01/05/23 2244   01/04/23 2130  ceFAZolin (ANCEF) IVPB 2g/100 mL premix        2 g 200 mL/hr over 30 Minutes Intravenous  Once 01/04/23 2124 01/04/23 2146       Assessment/Plan: s/p Procedure(s): IRRIGATION AND DEBRIDEMENT RIGHT FOREARM (Right) OPEN REDUCTION INTERNAL FIXATION (ORIF) TIBIA FRACTURE (Right) INTRAMEDULLARY (IM) NAIL TIBIAL (Left) Open L tibia fx - s/p IMN 9/20 Dr. Jena Gauss, WBAT transfers only LLE in CAM  boot R tibial plateau fx - s/p ORIF 9/20 Dr. Jena Gauss, NWB RLE, Bledsoe brace R forearm laceration - Debrided 9/20 Dr. Jena Gauss Liver laceration G2 - abdominal exam benign. Trend hgb. LFTs up slightly, monitor, no abdominal pain ABL anemia -received 1u PRBCs 9/21 for hgb 6.8, hgb stable at 7.8 on 9/27 Pulmonary contusions - pulm toilet Elevated Creatinine - Resolved 9/22, good UOP. Bipolar - had been off meds x1 month. Home abilify and atarax restarted. Psych consulted and meds resumed 9/23. Lithium level low 9/27, adjusted per psych and level  should pending this AM HTN - home metoprolol resumed 9/22   FEN- Regular diet, SLIV VTE- eliquis 2.5 mg bid started 9/26 ID- Completed 3d rocephin for open fxs. AF, WBC down to 12K today Dispo- dc  LOS: 16 days    Dortha Schwalbe  MD  01/20/2023
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
## 2023-05-17 ENCOUNTER — Ambulatory Visit (INDEPENDENT_AMBULATORY_CARE_PROVIDER_SITE_OTHER): Payer: 59 | Admitting: Family

## 2023-05-17 ENCOUNTER — Encounter: Payer: Self-pay | Admitting: Family

## 2023-05-17 ENCOUNTER — Telehealth: Payer: Self-pay | Admitting: Family

## 2023-05-17 VITALS — BP 133/86 | HR 75 | Temp 98.3°F | Ht 70.0 in | Wt 146.6 lb

## 2023-05-17 DIAGNOSIS — S8292XS Unspecified fracture of left lower leg, sequela: Secondary | ICD-10-CM | POA: Diagnosis not present

## 2023-05-17 DIAGNOSIS — I10 Essential (primary) hypertension: Secondary | ICD-10-CM

## 2023-05-17 DIAGNOSIS — S8291XS Unspecified fracture of right lower leg, sequela: Secondary | ICD-10-CM

## 2023-05-17 DIAGNOSIS — M79604 Pain in right leg: Secondary | ICD-10-CM | POA: Diagnosis not present

## 2023-05-17 DIAGNOSIS — M79605 Pain in left leg: Secondary | ICD-10-CM

## 2023-05-17 DIAGNOSIS — F319 Bipolar disorder, unspecified: Secondary | ICD-10-CM

## 2023-05-17 DIAGNOSIS — R21 Rash and other nonspecific skin eruption: Secondary | ICD-10-CM

## 2023-05-17 DIAGNOSIS — Z7689 Persons encountering health services in other specified circumstances: Secondary | ICD-10-CM

## 2023-05-17 DIAGNOSIS — G8929 Other chronic pain: Secondary | ICD-10-CM

## 2023-05-17 MED ORDER — TRIAMCINOLONE ACETONIDE 0.025 % EX CREA
1.0000 | TOPICAL_CREAM | Freq: Two times a day (BID) | CUTANEOUS | 0 refills | Status: DC
Start: 2023-05-17 — End: 2023-07-11

## 2023-05-17 NOTE — Progress Notes (Signed)
Patient wants to talk about having physical therapy.   Patient has a rash by right collar bone.   Patient brother wants FL-2. Brother asking if he can get a general referral for his brother so he is not at his mom's house.
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
## 2023-05-18 ENCOUNTER — Telehealth: Payer: Self-pay | Admitting: Family

## 2023-05-18 NOTE — Telephone Encounter (Signed)
Copied from CRM [id number]. Topic: Referral - Request for Referral >> May 18, 2023  8:17 AM Dennison Nancy wrote: Did the patient discuss referral with their provider in the last year? Yes (If No - schedule appointment) (If Yes - send message)  Appointment offered? No  Type of order/referral and detailed reason for visit: Londen Bok spoke to someone yesterday on 05/17/23 and she deny patient not needing an orthopedic [name] called back on 05/18/23 and change mind want patient to see an Orthopedic that patient do need to see an Orthopedic specialist first   Preference of office, provider, location: somewhere near [place] the phone number for the Orthopedic specialist [phone])  If referral order, have you been seen by this specialty before? No (If Yes, this issue or another issue? When? Where?  Can we respond through MyChart? No   -     phone call please

## 2023-05-18 NOTE — Telephone Encounter (Signed)
Patient and patient's family requesting long term care.

## 2023-05-21 ENCOUNTER — Telehealth: Payer: Self-pay | Admitting: Family

## 2023-05-21 NOTE — Telephone Encounter (Signed)
Copied from CRM [id number]. Topic: General - Other >> May 21, 2023  3:41 PM Whitney O wrote: Reason for CRM: patient mother is calling cause patient does need a referral to the orthopedic. Patient mom says nurse of dr Zonia Kief called and was returning that call too [phone] And patient mom says they are suppose to call sometime this week concerning a appointment at the [facility] for physical therapy

## 2023-05-22 ENCOUNTER — Telehealth: Payer: Self-pay

## 2023-05-22 NOTE — Telephone Encounter (Signed)
Just wanted to make sure this was ok that I scheduled appointment for this patient? He was in the hospital in an accident last year still having leg pain/issues. Ok to be seen here with you?

## 2023-05-22 NOTE — Telephone Encounter (Signed)
Looks like this patient was operated on by Dr. Jena Gauss in September 2024.  If he is having problems related to that injury or surgery then he should follow up with Dr. Jena Gauss.

## 2023-05-22 NOTE — Telephone Encounter (Signed)
 Noted. Agreed.

## 2023-05-22 NOTE — Telephone Encounter (Signed)
Noted. Orthopedic Surgery referral ordered 05/17/2023.

## 2023-05-23 NOTE — Telephone Encounter (Signed)
 Patient has been cancelled and given the info that he needs to see [facility]

## 2023-05-29 ENCOUNTER — Ambulatory Visit: Payer: 59 | Admitting: Orthopaedic Surgery

## 2023-05-29 ENCOUNTER — Other Ambulatory Visit: Payer: Self-pay | Admitting: Student

## 2023-05-29 ENCOUNTER — Other Ambulatory Visit: Payer: Self-pay | Admitting: Family

## 2023-05-29 DIAGNOSIS — S8292XS Unspecified fracture of left lower leg, sequela: Secondary | ICD-10-CM

## 2023-05-29 DIAGNOSIS — S82402E Unspecified fracture of shaft of left fibula, subsequent encounter for open fracture type I or II with routine healing: Secondary | ICD-10-CM

## 2023-05-29 DIAGNOSIS — S8291XS Unspecified fracture of right lower leg, sequela: Secondary | ICD-10-CM

## 2023-05-29 DIAGNOSIS — G8929 Other chronic pain: Secondary | ICD-10-CM

## 2023-05-29 NOTE — Telephone Encounter (Signed)
Copied from CRM [id number]. Topic: Referral - Request for Referral >> May 29, 2023  1:13 PM Yolanda T wrote: Did the patient discuss referral with their provider in the last year? Yes  Appointment offered? No  Type of order/referral and detailed reason for visit: Orthopedic for physical therapy  Preference of office, provider, location: unknown  If referral order, have you been seen by this specialty before? No (If Yes, this issue or another issue? When? Where?  Can we respond through MyChart? No

## 2023-05-29 NOTE — Telephone Encounter (Signed)
Complete

## 2023-05-31 ENCOUNTER — Encounter: Payer: Self-pay | Admitting: Student

## 2023-06-01 ENCOUNTER — Ambulatory Visit
Admission: RE | Admit: 2023-06-01 | Discharge: 2023-06-01 | Disposition: A | Discharge status: Home / Self Care | Payer: 59 | Source: Ambulatory Visit | Attending: Student | Admitting: Student

## 2023-06-01 DIAGNOSIS — S82202E Unspecified fracture of shaft of left tibia, subsequent encounter for open fracture type I or II with routine healing: Secondary | ICD-10-CM

## 2023-06-08 ENCOUNTER — Telehealth: Payer: Self-pay

## 2023-06-08 ENCOUNTER — Telehealth: Payer: Self-pay | Admitting: Family

## 2023-06-08 NOTE — Telephone Encounter (Signed)
Copied from CRM [id number]. Topic: General - Other >> Jun 07, 2023 10:19 AM Mosetta Putt H wrote: Reason for CRM: patient mom is requesting physical therapy for patient in [place]

## 2023-06-08 NOTE — Telephone Encounter (Signed)
Will continue with patient/patient's preference. Signed.

## 2023-06-08 NOTE — Telephone Encounter (Signed)
patient's mom and she would like for him to go to [facility]  for inpatient PT  if at all possible. Mom is also  requesting an order placed for him to stay as long as possible .  She says they are  really struggling with his care  would like a follow up call to discuss this as soon as possible . Can you advise is this something we can send in for patient?n or will I need to send it to the case manager?

## 2023-06-08 NOTE — Telephone Encounter (Signed)
 Routing to office

## 2023-06-08 NOTE — Telephone Encounter (Signed)
Patient referred to Physical Therapy and Orthopedic Surgery on 05/29/2023. Erskine Squibb, please follow-up with patient/patient's family. Thank you.

## 2023-06-08 NOTE — Telephone Encounter (Signed)
Will continue with patient/patient's family preference. Signed.

## 2023-06-11 NOTE — Telephone Encounter (Signed)
 Signed FL2 , demographics and last office visit note faxed to [facility]: [phone] for review

## 2023-06-11 NOTE — Telephone Encounter (Signed)
 Conversation about inpatient rehab is continued in another telephone encounter

## 2023-06-13 ENCOUNTER — Telehealth: Payer: Self-pay | Admitting: Family

## 2023-06-13 ENCOUNTER — Telehealth: Payer: Self-pay

## 2023-06-13 NOTE — Telephone Encounter (Signed)
 Patient needs refill for Abilify and metoprolol he is no longer seeing his provided who prescribed it before and they advise patient they should be able to get refill from PCP. Please advise

## 2023-06-13 NOTE — Telephone Encounter (Signed)
 Reason for CRM: [name], mom, calling to speak with [facility] regarding the status of getting rehabilitation for her son. Please callback at [phone]

## 2023-06-13 NOTE — Telephone Encounter (Signed)
 I spoke to Ms Judy/ [facility] and confirmed the email address for admissions.  The FL2 and supporting documents were then emailed as requested to [email or web address]

## 2023-06-13 NOTE — Telephone Encounter (Signed)
 [employer] as requested by mother to see if they received documents they have not I spoke with admissions coordinator and she stated she would rather it be emailed her email is [email or web address].

## 2023-06-14 ENCOUNTER — Other Ambulatory Visit: Payer: Self-pay | Admitting: Family

## 2023-06-14 DIAGNOSIS — I10 Essential (primary) hypertension: Secondary | ICD-10-CM

## 2023-06-14 MED ORDER — METOPROLOL SUCCINATE ER 25 MG PO TB24: 25.0000 mg | ORAL_TABLET | Freq: Every day | ORAL | 0 refills | Status: DC

## 2023-06-14 NOTE — Telephone Encounter (Signed)
-   Aripiprazole last prescribed 11/12/2022. Dr. Andrey Campanile please advise if acceptable to prescribe. - Metoprolol Succinate prescribed.

## 2023-06-18 ENCOUNTER — Telehealth: Payer: Self-pay

## 2023-06-18 ENCOUNTER — Telehealth (HOSPITAL_COMMUNITY): Payer: Self-pay

## 2023-06-18 NOTE — Telephone Encounter (Signed)
 Copied from CRM [id number]. Topic: General - Other >> Jun 18, 2023 10:24 AM Antony Haste wrote: Reason for CRM: The patient's mother, [name] is requesting a callback from [facility] to discuss the status of his medication - Abilify and when this will be sent over to [facility]. His mother is also requesting additional assistance for home health due to her son being unable to walk.  Callback [phone]

## 2023-06-18 NOTE — Telephone Encounter (Signed)
Thank you Dr Wilson.

## 2023-06-18 NOTE — Telephone Encounter (Signed)
 Patient mom  is requesting ARIPiprazole (ABILIFY) 10 MG table I called [facility] to set up appt for [facility] but he needs a 30day supply per mom, Please advise

## 2023-06-18 NOTE — Telephone Encounter (Signed)
 Patient has appt on  06/27/2023 with [facility] I made for him. Can patient have enough  until then?

## 2023-06-18 NOTE — Telephone Encounter (Signed)
 Copied from CRM [id number]. Topic: Referral - Status >> Jun 18, 2023 12:42 PM Abundio Miu S wrote: Reason for CRM: Nelma Rothman, Interior and spatial designer of Admissions for [place], needs to speak with clinical staff in relation to patient not being approved for therapy. Callback # [phone]

## 2023-06-18 NOTE — Telephone Encounter (Signed)
 Routing to office

## 2023-06-18 NOTE — Telephone Encounter (Signed)
-   Aripiprazole last prescribed 11/12/2022. Dr. Andrey Campanile please advise if acceptable to prescribe.

## 2023-06-18 NOTE — Telephone Encounter (Signed)
 Patients mother is calling for a refill on patients Abilify, I explained to her that number one we have not seen her son yet and number two, he is 6 and we do not have a release of information to speak with her. I advised her to call his previous doctor and let them know that patient has an appointment and needs a bridge prescription. I also advised her to come in and have her son sign a release of information so that we can talk to her.

## 2023-06-18 NOTE — Telephone Encounter (Signed)
 I will let mom know this information.Marland Kitchen

## 2023-06-19 ENCOUNTER — Telehealth: Payer: Self-pay | Admitting: Family

## 2023-06-19 ENCOUNTER — Other Ambulatory Visit: Payer: Self-pay | Admitting: Family

## 2023-06-19 DIAGNOSIS — F319 Bipolar disorder, unspecified: Secondary | ICD-10-CM

## 2023-06-19 MED ORDER — ARIPIPRAZOLE 10 MG PO TABS: 10.0000 mg | ORAL_TABLET | Freq: Every day | ORAL | 0 refills | Status: DC

## 2023-06-19 NOTE — Telephone Encounter (Signed)
Dr. Wilson please advise

## 2023-06-19 NOTE — Telephone Encounter (Signed)
 Copied from CRM [id number]. Topic: General - Other >> Jun 18, 2023 10:24 AM Antony Haste wrote: Reason for CRM: The patient's mother, [name] is requesting a callback from [facility] to discuss the status of his medication - Abilify and when this will be sent over to [facility]. His mother is also requesting additional assistance for home health due to her son being unable to walk.  Callback #:[phone] >> Jun 19, 2023 11:40 AM Clayton Bibles wrote: Patient's mom is calling back about medication Abilify. She wants to know who she can call to get the medication refilled. She called Behavioral Health and they will only give him shots and [name] does not want shots. Please call [name] back at #[phone].

## 2023-06-19 NOTE — Telephone Encounter (Signed)
 I discussed with Georganna Skeans, MD who advised to prescribe Aripiprazole for only 7 days and that any future refills will need to be from [facility] Robyne Peers, RN is assisting with home health.

## 2023-06-19 NOTE — Telephone Encounter (Signed)
 I spoke to patient's mother, [name], and she has been in contact with [facility] and they are waiting on approval from his insurance company.  She said they initially had the incorrect insurance information.

## 2023-06-20 NOTE — Telephone Encounter (Signed)
 Patient mom was called and is aware that provider sent in medication  until his upcoming appt

## 2023-06-20 NOTE — Telephone Encounter (Signed)
 Noted.

## 2023-06-21 NOTE — Telephone Encounter (Signed)
 I received the following message from Lollie Sails, Admissions Director- [facility]:  Unfortunately, we are unable to offer a bed for Mr. [name]. I hope you all can find placement for him. She said they have not informed the patient's  family.   I spoke to patient's mother, [name] and informed her of the message from [place].  She wanted more information about the denial- was it his insurance?  Will they have a bed at some point?  I encouraged her to contact [place] with her questions and she said she would.

## 2023-06-27 ENCOUNTER — Ambulatory Visit (HOSPITAL_COMMUNITY): Admitting: Psychiatry

## 2023-06-27 NOTE — Telephone Encounter (Signed)
 Noted.

## 2023-06-28 NOTE — Telephone Encounter (Signed)
Thank you Dr Wilson.

## 2023-07-02 ENCOUNTER — Other Ambulatory Visit: Payer: Self-pay

## 2023-07-02 ENCOUNTER — Ambulatory Visit: Attending: Family

## 2023-07-02 DIAGNOSIS — S8292XS Unspecified fracture of left lower leg, sequela: Secondary | ICD-10-CM | POA: Insufficient documentation

## 2023-07-02 DIAGNOSIS — G8929 Other chronic pain: Secondary | ICD-10-CM | POA: Insufficient documentation

## 2023-07-02 DIAGNOSIS — M79604 Pain in right leg: Secondary | ICD-10-CM | POA: Diagnosis present

## 2023-07-02 DIAGNOSIS — M79605 Pain in left leg: Secondary | ICD-10-CM | POA: Diagnosis present

## 2023-07-02 DIAGNOSIS — S8291XS Unspecified fracture of right lower leg, sequela: Secondary | ICD-10-CM | POA: Diagnosis present

## 2023-07-02 NOTE — Therapy (Signed)
 OUTPATIENT PHYSICAL THERAPY LOWER EXTREMITY EVALUATION   Patient Name: [name] MRN: [id number] DOB:1960-10-10, 63 y.o., male Today's Date: 07/02/2023  END OF SESSION:  PT End of Session - 07/02/23 1137     Authorization Type MCD    PT Start Time 1130    PT Stop Time 1215    PT Time Calculation (min) 45 min    Activity Tolerance Patient tolerated treatment well    Behavior During Therapy [facility] for tasks assessed/performed             Past Medical History:  Diagnosis Date   Alcohol use disorder 10/29/2022   Anxiety and depression    Bipolar 1 disorder (HCC)    Cocaine use disorder, mild, abuse (HCC) 12/31/2015   Tibia fracture 01/04/2023   Type I or II open fracture of left tibia and fibula 01/08/2023   Wernicke encephalopathy 10/29/2022   Past Surgical History:  Procedure Laterality Date   EYE SURGERY     I & D EXTREMITY Right 01/05/2023   Procedure: IRRIGATION AND DEBRIDEMENT RIGHT FOREARM;  Surgeon: Roby Lofts, MD;  Location: [facility] OR;  Service: Orthopedics;  Laterality: Right;   ORIF TIBIA FRACTURE Right 01/05/2023   Procedure: OPEN REDUCTION INTERNAL FIXATION (ORIF) TIBIA FRACTURE;  Surgeon: Roby Lofts, MD;  Location: [facility] OR;  Service: Orthopedics;  Laterality: Right;   TIBIA IM NAIL INSERTION Left 01/05/2023   Procedure: INTRAMEDULLARY (IM) NAIL TIBIAL;  Surgeon: Roby Lofts, MD;  Location: [facility] OR;  Service: Orthopedics;  Laterality: Left;   Patient Active Problem List   Diagnosis Date Noted   Bipolar disorder, in partial remission, most recent episode depressed (HCC) 01/08/2023   Type I or II open fracture of left tibia and fibula 01/08/2023   Tibia fracture 01/04/2023   Bipolar 1 disorder (HCC) 10/29/2022   Wernicke encephalopathy 10/29/2022   Alcohol use disorder 10/29/2022   Anxiety and depression    Bipolar affective disorder in remission (HCC) 01/16/2016   Cocaine use disorder, mild, abuse (HCC) 12/31/2015    PCP: Rema Fendt, NP    REFERRING PROVIDER: Rema Fendt, NP  REFERRING DIAG: M79.604,M79.605,G89.29 (ICD-10-CM) - Chronic pain of both lower extremities S82.92XS (ICD-10-CM) - Leg fracture, left, sequela S82.91XS (ICD-10-CM) - Leg fracture, right, sequela  THERAPY DIAG:  Chronic pain of both lower extremities  Leg fracture, left, sequela  Leg fracture, right, sequela  Rationale for Evaluation and Treatment: Rehabilitation  ONSET DATE: chronic  SUBJECTIVE:   SUBJECTIVE STATEMENT: Current issues and/or concerns: - States bilateral legs broken September 2024. Surgery was completed. Stable since then. Using front wheel walker and wheelchair to assist with ambulation. States Orthopedics would no longer see him due to his health insurance was out of network.  - States Cardiology will no longer see him due to his health insurance was out of network.  - States Behavioral Health will no longer see him due to his health insurance was out of network. He denies thoughts of self-harm, suicidal ideations, homicidal ideations. - States patient living with his mother and would like for patient to be referred to [facility]. - Rash near right clavicle. Denies red flag symptoms. States thinks may be related to recently changed laundry detergent.  - No further issues/concerns for discussion today.   PERTINENT HISTORY: 2. Chronic pain of both lower extremities 3. Leg fracture, left, sequela 4. Leg fracture, right, sequela - Referral to Orthopedic Surgery and Rehabilitation for evaluation/management.  - Follow-up with primary  provider as scheduled.  - Ambulatory referral to Orthopedic Surgery - AMB referral to rehabilitation   PAIN:  Are you having pain? Yes: NPRS scale: 0-7/10 Pain location: L lower leg Pain description: ache  Aggravating factors: weight bearing, prolonged walking Relieving factors: rest and position changes  PRECAUTIONS: None  RED FLAGS: None   WEIGHT BEARING RESTRICTIONS:  No  FALLS:  Has patient fallen in last 6 months? No  LIVING ENVIRONMENT: Lives with: lives with their family Lives in: House/apartment Stairs:  yes Has following equipment at home: Dan Humphreys - 2 wheeled  OCCUPATION: not working  PLOF: Independent with basic ADLs  PATIENT GOALS: To get around better  NEXT MD VISIT: TBD  OBJECTIVE:  Note: Objective measures were completed at Evaluation unless otherwise noted.  DIAGNOSTIC FINDINGS: IMPRESSION: 1. 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 Antibiotic laden methylmethacrylate along the mid anterior tibial diaphysis within the tibial cortical defect. Small areas of bone bridging, but a majority of the fracture clefts persist without significant callus formation consistent with nonunion. 2. Oblique ununited fracture of the proximal fibular metadiaphysis without significant callus formation.     Electronically Signed   By: Elige Ko M.D.   On: 06/09/2023 09:03  PATIENT SURVEYS:  Patient-specific activity scoring scheme (Point to one number):  "0" represents "unable to perform." "10" represents "able to perform at prior level. 0 1 2 3 4 5 6 7 8 9  10 (Date and Score) Activity Initial  Activity Eval     Stair climbing  5    Arising from a chair  5    Prolonged walking  3    Additional Additional Total score = sum of the activity scores/number of activities Minimum detectable change (90%CI) for average score = 2 points Minimum detectable change (90%CI) for single activity score = 3 points PSFS developed by: Jake Seats., & Binkley, J. (1995). Assessing disability and change on individual  patients: a report of a patient specific measure. Physiotherapy [place], 47, [phone]. Reproduced with the permission of the authors  Score: 13/30 43% perceived functional ability  COGNITION: Overall cognitive status:  Within functional limits for tasks assessed    MUSCLE LENGTH: Hamstrings: Right 60 deg; Left 60 deg   POSTURE: No Significant postural limitations  PALPATION: deferred  LOWER EXTREMITY ROM:  A/PROM Right eval Left eval  Hip flexion    Hip extension    Hip abduction    Hip adduction    Hip internal rotation    Hip external rotation    Knee flexion    Knee extension -10/-2d -5/0d  Ankle dorsiflexion    Ankle plantarflexion    Ankle inversion    Ankle eversion     (Blank rows = not tested)  LOWER EXTREMITY MMT:  MMT Right eval Left eval  Hip flexion    Hip extension 4- 4-  Hip abduction    Hip adduction    Hip internal rotation    Hip external rotation    Knee flexion    Knee extension 4- 4-  Ankle dorsiflexion    Ankle plantarflexion 4- 4-  Ankle inversion    Ankle eversion     (Blank rows = not tested)  LOWER EXTREMITY SPECIAL TESTS:  deferred  FUNCTIONAL TESTS:  30 seconds chair stand test 8 with UE assist needed  GAIT: Distance walked: 35ftx2 Assistive device utilized: Walker - 2 wheeled Level of assistance: Modified independence  Comments: slow cadence                                                                                                                                TREATMENT DATE:  [facility] Adult PT Treatment:                                                DATE: 07/02/23 Eval and HEP  Self Care: Additional minutes spent for educating on updated Therapeutic Home Exercise Program as well as comparing current status to condition at start of symptoms. This included exercises focusing on stretching, strengthening, with focus on eccentric aspects. Long term goals include an improvement in range of motion, strength, endurance as well as avoiding reinjury. Patient's frequency would include in 1-2 times a day, 3-5 times a week for a duration of 6-12 weeks. Proper technique shown and discussed handout in great detail. All questions were discussed and  addressed.       PATIENT EDUCATION:  Education details: Discussed eval findings, rehab rationale and POC and patient is in agreement  Person educated: Patient and Parent Education method: Explanation Education comprehension: verbalized understanding and needs further education  HOME EXERCISE PROGRAM: Access Code: [email or web address] URL: [email or web address] Date: 07/02/2023 Prepared by: Gustavus Bryant  Exercises - Supine Knee Extension Strengthening  - 2 x daily - 5 x weekly - 1-2 sets - 15 reps - Supine Bridge  - 2 x daily - 5 x weekly - 1-2 sets - 15 reps - Seated Long Arc Quad  - 2 x daily - 5 x weekly - 1-2 sets - 15 reps - Heel Toe Raises with Counter Support  - 2 x daily - 5 x weekly - 1-2 sets - 15 reps - Seated Table Hamstring Stretch  - 2 x daily - 5 x weekly - 1 sets - 2 reps - 30s hold  ASSESSMENT:  CLINICAL IMPRESSION: Patient is a 63 y.o. male who was seen today for physical therapy evaluation and treatment for BLE weakness following traumatic fractures requiring ORIF. Patient presents with limited knee extension B, tight hamstrings and B LE weakness as evidenced by 30s chair stand test.  Further testing of balance and endurance warranted at upcoming sessions.  OBJECTIVE IMPAIRMENTS: Abnormal gait, decreased activity tolerance, decreased coordination, decreased endurance, decreased knowledge of condition, decreased mobility, difficulty walking, decreased strength, increased fascial restrictions, improper body mechanics, and pain.   ACTIVITY LIMITATIONS: carrying, lifting, standing, squatting, and stairs  PERSONAL FACTORS: Behavior pattern, Fitness, Past/current experiences, Time since onset of injury/illness/exacerbation, and Transportation are also affecting patient's functional outcome.   REHAB POTENTIAL: Fair based on needs   CLINICAL DECISION MAKING: Evolving/moderate complexity  EVALUATION COMPLEXITY: Moderate   GOALS: Goals reviewed with patient?  No  SHORT TERM GOALS: Target date: 07/23/2023   Patient to demonstrate  independence in HEP  Baseline: [id number] Goal status: INITIAL  2.  Assess 2 MWT Baseline: TBD Goal status: INITIAL  3.  Patient to negotiate 8 steps with most appropriate pattern Baseline: TBD Goal status: INITIAL   LONG TERM GOALS: Target date: 08/13/2023   Patient will acknowledge 4/10 worst pain at least once during episode of care   Baseline: 7/10 worst pain Goal status: INITIAL  2.  Patient will score at least 21/30 on PSFS to signify clinically meaningful improvement in functional abilities.   Baseline: 13/30 43% perceived functional ability Goal status: INITIAL  3.  Patient will increase 30s chair stand reps from 8 to 12 with arms to demonstrate and improved functional ability with less pain/difficulty as well as reduce fall risk.  Baseline: 8 Goal status: INITIAL  4.  Increase BLE strength to 4/5 Baseline:  MMT Right eval Left eval  Hip flexion    Hip extension 4- 4-  Hip abduction    Hip adduction    Hip internal rotation    Hip external rotation    Knee flexion    Knee extension 4- 4-  Ankle dorsiflexion    Ankle plantarflexion 4- 4-   Goal status: INITIAL  5.  Retest 2 MWT for progress made Baseline: TBD Goal status: INITIAL  6.  236ft ambulation with LRAD Baseline: 30ft with RW Goal status: INITIAL   PLAN:  PT FREQUENCY: 2x/week  PT DURATION: 6 weeks  PLANNED INTERVENTIONS: 97164- PT Re-evaluation, 97110-Therapeutic exercises, 97530- Therapeutic activity, 97112- Neuromuscular re-education, 97535- Self Care, 84696- Manual therapy, [phone]- Gait training, Patient/Family education, Balance training, and Stair training  PLAN FOR NEXT SESSION: HEP review and update, manual techniques as appropriate, aerobic tasks, ROM and flexibility activities, strengthening and PREs, TPDN, gait and balance training as needed   For all possible CPT codes, reference the Planned Interventions  line above.     Check all conditions that are expected to impact treatment: {Conditions expected to impact treatment:Contractures, spasticity or fracture relevant to requested treatment and Social determinants of health   If treatment provided at initial evaluation, no treatment charged due to lack of authorization.       Hildred Laser, PT 07/02/2023, 12:47 PM

## 2023-07-02 NOTE — Telephone Encounter (Signed)
 noted

## 2023-07-09 ENCOUNTER — Ambulatory Visit: Admitting: Family

## 2023-07-09 NOTE — Therapy (Unsigned)
 OUTPATIENT PHYSICAL THERAPY LOWER TREATMENT NOTE   Patient Name: [name] MRN: [id number] DOB:[birth year], 63 y.o., male Today's Date: 07/10/2023  END OF SESSION:  PT End of Session - 07/10/23 1317     Visit Number 2    Number of Visits 12    Date for PT Re-Evaluation 09/01/23    Authorization Type MCD    PT Start Time 1315    PT Stop Time 1400    PT Time Calculation (min) 45 min    Activity Tolerance Patient tolerated treatment well    Behavior During Therapy [facility] for tasks assessed/performed              Past Medical History:  Diagnosis Date   Alcohol use disorder 10/29/2022   Anxiety and depression    Bipolar 1 disorder (HCC)    Cocaine use disorder, mild, abuse (HCC) 12/31/2015   Tibia fracture 01/04/2023   Type I or II open fracture of left tibia and fibula 01/08/2023   Wernicke encephalopathy 10/29/2022   Past Surgical History:  Procedure Laterality Date   EYE SURGERY     I & D EXTREMITY Right 01/05/2023   Procedure: IRRIGATION AND DEBRIDEMENT RIGHT FOREARM;  Surgeon: Roby Lofts, MD;  Location: [facility] OR;  Service: Orthopedics;  Laterality: Right;   ORIF TIBIA FRACTURE Right 01/05/2023   Procedure: OPEN REDUCTION INTERNAL FIXATION (ORIF) TIBIA FRACTURE;  Surgeon: Roby Lofts, MD;  Location: [facility] OR;  Service: Orthopedics;  Laterality: Right;   TIBIA IM NAIL INSERTION Left 01/05/2023   Procedure: INTRAMEDULLARY (IM) NAIL TIBIAL;  Surgeon: Roby Lofts, MD;  Location: [facility] OR;  Service: Orthopedics;  Laterality: Left;   Patient Active Problem List   Diagnosis Date Noted   Bipolar disorder, in partial remission, most recent episode depressed (HCC) 01/08/2023   Type I or II open fracture of left tibia and fibula 01/08/2023   Tibia fracture 01/04/2023   Bipolar 1 disorder (HCC) 10/29/2022   Wernicke encephalopathy 10/29/2022   Alcohol use disorder 10/29/2022   Anxiety and depression    Bipolar affective disorder in remission (HCC) 01/16/2016   Cocaine  use disorder, mild, abuse (HCC) 12/31/2015    PCP: Rema Fendt, NP   REFERRING PROVIDER: Rema Fendt, NP  REFERRING DIAG: M79.604,M79.605,G89.29 (ICD-10-CM) - Chronic pain of both lower extremities S82.92XS (ICD-10-CM) - Leg fracture, left, sequela S82.91XS (ICD-10-CM) - Leg fracture, right, sequela  THERAPY DIAG:  Chronic pain of both lower extremities  Leg fracture, left, sequela  Leg fracture, right, sequela  Rationale for Evaluation and Treatment: Rehabilitation  ONSET DATE: chronic  SUBJECTIVE:   SUBJECTIVE STATEMENT: Has been compliant with HEP.  Arrives to PT with some LLE lower leg discomfort   PERTINENT HISTORY: 2. Chronic pain of both lower extremities 3. Leg fracture, left, sequela 4. Leg fracture, right, sequela - Referral to Orthopedic Surgery and Rehabilitation for evaluation/management.  - Follow-up with primary provider as scheduled.  - Ambulatory referral to Orthopedic Surgery - AMB referral to rehabilitation   PAIN:  Are you having pain? Yes: NPRS scale: 0-7/10 Pain location: L lower leg Pain description: ache  Aggravating factors: weight bearing, prolonged walking Relieving factors: rest and position changes  PRECAUTIONS: None  RED FLAGS: None   WEIGHT BEARING RESTRICTIONS: No  FALLS:  Has patient fallen in last 6 months? No  LIVING ENVIRONMENT: Lives with: lives with their family Lives in: House/apartment Stairs:  yes Has following equipment at home: Dan Humphreys - 2 wheeled  OCCUPATION: not  working  PLOF: Independent with basic ADLs  PATIENT GOALS: To get around better  NEXT MD VISIT: TBD  OBJECTIVE:  Note: Objective measures were completed at Evaluation unless otherwise noted.  DIAGNOSTIC FINDINGS: IMPRESSION: 1. 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 Antibiotic laden methylmethacrylate along the mid anterior tibial diaphysis  within the tibial cortical defect. Small areas of bone bridging, but a majority of the fracture clefts persist without significant callus formation consistent with nonunion. 2. Oblique ununited fracture of the proximal fibular metadiaphysis without significant callus formation.     Electronically Signed   By: Elige Ko M.D.   On: 06/09/2023 09:03  PATIENT SURVEYS:  Patient-specific activity scoring scheme (Point to one number):  "0" represents "unable to perform." "10" represents "able to perform at prior level. 0 1 2 3 4 5 6 7 8 9  10 (Date and Score) Activity Initial  Activity Eval     Stair climbing  5    Arising from a chair  5    Prolonged walking  3    Additional Additional Total score = sum of the activity scores/number of activities Minimum detectable change (90%CI) for average score = 2 points Minimum detectable change (90%CI) for single activity score = 3 points PSFS developed by: Jake Seats., & Binkley, J. (1995). Assessing disability and change on individual  patients: a report of a patient specific measure. Physiotherapy [place], 47, [phone]. Reproduced with the permission of the authors  Score: 13/30 43% perceived functional ability  COGNITION: Overall cognitive status: Within functional limits for tasks assessed    MUSCLE LENGTH: Hamstrings: Right 60 deg; Left 60 deg   POSTURE: No Significant postural limitations  PALPATION: deferred  LOWER EXTREMITY ROM:  A/PROM Right eval Left eval  Hip flexion    Hip extension    Hip abduction    Hip adduction    Hip internal rotation    Hip external rotation    Knee flexion    Knee extension -10/-2d -5/0d  Ankle dorsiflexion    Ankle plantarflexion    Ankle inversion    Ankle eversion     (Blank rows = not tested)  LOWER EXTREMITY MMT:  MMT Right eval Left eval  Hip flexion    Hip extension 4- 4-  Hip abduction    Hip adduction    Hip internal rotation    Hip  external rotation    Knee flexion    Knee extension 4- 4-  Ankle dorsiflexion    Ankle plantarflexion 4- 4-  Ankle inversion    Ankle eversion     (Blank rows = not tested)  LOWER EXTREMITY SPECIAL TESTS:  deferred  FUNCTIONAL TESTS:  30 seconds chair stand test 8 with UE assist needed  GAIT: Distance walked: 38ftx2 Assistive device utilized: Walker - 2 wheeled Level of assistance: Modified independence Comments: slow cadence  TREATMENT DATE:  [facility] Adult PT Treatment:                                                DATE: 07/10/23 Therapeutic Exercise: Nustep L2 8 min Seated hamstring stretch 30s x2 B Neuromuscular re-ed: Bridge with ball squeeze 15x Bridge against RTB 15x Standing heel raises 15x Therapeutic Activity: SLR 15x B SAQs 15x B Supine hip fallouts RTB 15x B  [facility] Adult PT Treatment:                                                DATE: 07/02/23 Eval and HEP  Self Care: Additional minutes spent for educating on updated Therapeutic Home Exercise Program as well as comparing current status to condition at start of symptoms. This included exercises focusing on stretching, strengthening, with focus on eccentric aspects. Long term goals include an improvement in range of motion, strength, endurance as well as avoiding reinjury. Patient's frequency would include in 1-2 times a day, 3-5 times a week for a duration of 6-12 weeks. Proper technique shown and discussed handout in great detail. All questions were discussed and addressed.       PATIENT EDUCATION:  Education details: Discussed eval findings, rehab rationale and POC and patient is in agreement  Person educated: Patient and Parent Education method: Explanation Education comprehension: verbalized understanding and needs further education  HOME EXERCISE PROGRAM: Access Code:  [email or web address] URL: [email or web address] Date: 07/02/2023 Prepared by: Gustavus Bryant  Exercises - Supine Knee Extension Strengthening  - 2 x daily - 5 x weekly - 1-2 sets - 15 reps - Supine Bridge  - 2 x daily - 5 x weekly - 1-2 sets - 15 reps - Seated Long Arc Quad  - 2 x daily - 5 x weekly - 1-2 sets - 15 reps - Heel Toe Raises with Counter Support  - 2 x daily - 5 x weekly - 1-2 sets - 15 reps - Seated Table Hamstring Stretch  - 2 x daily - 5 x weekly - 1 sets - 2 reps - 30s hold  ASSESSMENT:  CLINICAL IMPRESSION: First f/u session.  Focus of session was establishing an exercise program for B LE strengthening.  Performed all exercises in various positions.  Session interrupted briefly due to issues with transportation. Able to perform all exercises w/o aggravation of symptoms.  Patient is a 63 y.o. male who was seen today for physical therapy evaluation and treatment for BLE weakness following traumatic fractures requiring ORIF. Patient presents with limited knee extension B, tight hamstrings and B LE weakness as evidenced by 30s chair stand test.  Further testing of balance and endurance warranted at upcoming sessions.  OBJECTIVE IMPAIRMENTS: Abnormal gait, decreased activity tolerance, decreased coordination, decreased endurance, decreased knowledge of condition, decreased mobility, difficulty walking, decreased strength, increased fascial restrictions, improper body mechanics, and pain.   ACTIVITY LIMITATIONS: carrying, lifting, standing, squatting, and stairs  PERSONAL FACTORS: Behavior pattern, Fitness, Past/current experiences, Time since onset of injury/illness/exacerbation, and Transportation are also affecting patient's functional outcome.   REHAB POTENTIAL: Fair based on needs   CLINICAL DECISION MAKING: Evolving/moderate complexity  EVALUATION COMPLEXITY: Moderate   GOALS: Goals reviewed with patient? No  SHORT TERM GOALS: Target date:  07/23/2023   Patient to  demonstrate independence in HEP  Baseline: [id number] Goal status: INITIAL  2.  Assess 2 MWT Baseline: TBD Goal status: INITIAL  3.  Patient to negotiate 8 steps with most appropriate pattern Baseline: TBD Goal status: INITIAL   LONG TERM GOALS: Target date: 08/13/2023   Patient will acknowledge 4/10 worst pain at least once during episode of care   Baseline: 7/10 worst pain Goal status: INITIAL  2.  Patient will score at least 21/30 on PSFS to signify clinically meaningful improvement in functional abilities.   Baseline: 13/30 43% perceived functional ability Goal status: INITIAL  3.  Patient will increase 30s chair stand reps from 8 to 12 with arms to demonstrate and improved functional ability with less pain/difficulty as well as reduce fall risk.  Baseline: 8 Goal status: INITIAL  4.  Increase BLE strength to 4/5 Baseline:  MMT Right eval Left eval  Hip flexion    Hip extension 4- 4-  Hip abduction    Hip adduction    Hip internal rotation    Hip external rotation    Knee flexion    Knee extension 4- 4-  Ankle dorsiflexion    Ankle plantarflexion 4- 4-   Goal status: INITIAL  5.  Retest 2 MWT for progress made Baseline: TBD Goal status: INITIAL  6.  242ft ambulation with LRAD Baseline: 37ft with RW Goal status: INITIAL   PLAN:  PT FREQUENCY: 2x/week  PT DURATION: 6 weeks  PLANNED INTERVENTIONS: 97164- PT Re-evaluation, 97110-Therapeutic exercises, 97530- Therapeutic activity, 97112- Neuromuscular re-education, 97535- Self Care, 54098- Manual therapy, [phone]- Gait training, Patient/Family education, Balance training, and Stair training  PLAN FOR NEXT SESSION: HEP review and update, manual techniques as appropriate, aerobic tasks, ROM and flexibility activities, strengthening and PREs, TPDN, gait and balance training as needed   For all possible CPT codes, reference the Planned Interventions line above.     Check all conditions that are expected to  impact treatment: {Conditions expected to impact treatment:Contractures, spasticity or fracture relevant to requested treatment and Social determinants of health   If treatment provided at initial evaluation, no treatment charged due to lack of authorization.       Hildred Laser, PT 07/10/2023, 2:17 PM

## 2023-07-10 ENCOUNTER — Ambulatory Visit

## 2023-07-10 DIAGNOSIS — G8929 Other chronic pain: Secondary | ICD-10-CM

## 2023-07-10 DIAGNOSIS — S8292XS Unspecified fracture of left lower leg, sequela: Secondary | ICD-10-CM

## 2023-07-10 DIAGNOSIS — S8291XS Unspecified fracture of right lower leg, sequela: Secondary | ICD-10-CM

## 2023-07-10 DIAGNOSIS — M79604 Pain in right leg: Secondary | ICD-10-CM | POA: Diagnosis not present

## 2023-07-11 ENCOUNTER — Ambulatory Visit (HOSPITAL_COMMUNITY)
Admission: EM | Admit: 2023-07-11 | Discharge: 2023-07-11 | Disposition: A | Discharge status: Home / Self Care | Attending: Behavioral Health | Admitting: Behavioral Health

## 2023-07-11 DIAGNOSIS — Z760 Encounter for issue of repeat prescription: Secondary | ICD-10-CM | POA: Insufficient documentation

## 2023-07-11 DIAGNOSIS — F319 Bipolar disorder, unspecified: Secondary | ICD-10-CM

## 2023-07-11 DIAGNOSIS — F419 Anxiety disorder, unspecified: Secondary | ICD-10-CM | POA: Insufficient documentation

## 2023-07-11 DIAGNOSIS — Z79899 Other long term (current) drug therapy: Secondary | ICD-10-CM | POA: Insufficient documentation

## 2023-07-11 MED ORDER — ARIPIPRAZOLE 10 MG PO TABS: 10.0000 mg | ORAL_TABLET | Freq: Every day | ORAL | 1 refills | Status: DC

## 2023-07-11 NOTE — Progress Notes (Addendum)
   07/11/23 1153  [facility] Triage Screening (Walk-ins at [facility] only)  How Did You Hear About [place]? Family/Friend  What Is the Reason for Your Visit/Call Today? [name] presents to [facility] voluntarily accompanied by his mother. Pt states that he needs medication refills for his Abilify. Pt states that he only has 2 pills left and is unable to get medication from [facility] because he now has insurance. Pt currently denies SI, HI, AVH and alcohol/drug use. Pt states that he only needs assistance with getting his medication.  How Long Has This Been Causing You Problems? <Week  Have You Recently Had Any Thoughts About Hurting Yourself? No  Are You Planning to Commit Suicide/Harm Yourself At This time? No  Have you Recently Had Thoughts About Hurting Someone Karolee Ohs? No  Are You Planning To Harm Someone At This Time? No  Physical Abuse Denies  Verbal Abuse Denies  Sexual Abuse Denies  Exploitation of patient/patient's resources Denies  Self-Neglect Denies  Are you currently experiencing any auditory, visual or other hallucinations? No  Have You Used Any Alcohol or Drugs in the Past 24 Hours? No  Do you have any current medical co-morbidities that require immediate attention? No  Clinician description of patient physical appearance/behavior: calm, cooperative  What Do You Feel Would Help You the Most Today? Medication(s)  If access to [facility] was not available, would you have sought care in the Emergency Department? No  Determination of Need Routine (7 days)  Options For Referral Medication Management
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
## 2023-07-11 NOTE — Therapy (Signed)
 OUTPATIENT PHYSICAL THERAPY TREATMENT NOTE   Patient Name: [name] MRN: [id number] DOB:1960/11/01, 63 y.o., male Today's Date: 07/16/2023  END OF SESSION:  PT End of Session - 07/16/23 1320     Visit Number 3    Number of Visits 12    Date for PT Re-Evaluation 09/01/23    Authorization Type MCD    PT Start Time 1315    PT Stop Time 1400    PT Time Calculation (min) 45 min    Activity Tolerance Patient tolerated treatment well    Behavior During Therapy [facility] for tasks assessed/performed               Past Medical History:  Diagnosis Date   Alcohol use disorder 10/29/2022   Anxiety and depression    Bipolar 1 disorder (HCC)    Cocaine use disorder, mild, abuse (HCC) 12/31/2015   Tibia fracture 01/04/2023   Type I or II open fracture of left tibia and fibula 01/08/2023   Wernicke encephalopathy 10/29/2022   Past Surgical History:  Procedure Laterality Date   EYE SURGERY     I & D EXTREMITY Right 01/05/2023   Procedure: IRRIGATION AND DEBRIDEMENT RIGHT FOREARM;  Surgeon: Roby Lofts, MD;  Location: [facility] OR;  Service: Orthopedics;  Laterality: Right;   ORIF TIBIA FRACTURE Right 01/05/2023   Procedure: OPEN REDUCTION INTERNAL FIXATION (ORIF) TIBIA FRACTURE;  Surgeon: Roby Lofts, MD;  Location: [facility] OR;  Service: Orthopedics;  Laterality: Right;   TIBIA IM NAIL INSERTION Left 01/05/2023   Procedure: INTRAMEDULLARY (IM) NAIL TIBIAL;  Surgeon: Roby Lofts, MD;  Location: [facility] OR;  Service: Orthopedics;  Laterality: Left;   Patient Active Problem List   Diagnosis Date Noted   Bipolar disorder, in partial remission, most recent episode depressed (HCC) 01/08/2023   Type I or II open fracture of left tibia and fibula 01/08/2023   Tibia fracture 01/04/2023   Bipolar 1 disorder (HCC) 10/29/2022   Wernicke encephalopathy 10/29/2022   Alcohol use disorder 10/29/2022   Anxiety and depression    Bipolar affective disorder in remission (HCC) 01/16/2016   Cocaine use  disorder, mild, abuse (HCC) 12/31/2015    PCP: Rema Fendt, NP   REFERRING PROVIDER: Rema Fendt, NP  REFERRING DIAG: M79.604,M79.605,G89.29 (ICD-10-CM) - Chronic pain of both lower extremities S82.92XS (ICD-10-CM) - Leg fracture, left, sequela S82.91XS (ICD-10-CM) - Leg fracture, right, sequela  THERAPY DIAG:  Chronic pain of both lower extremities  Leg fracture, left, sequela  Leg fracture, right, sequela  Rationale for Evaluation and Treatment: Rehabilitation  ONSET DATE: chronic  SUBJECTIVE:   SUBJECTIVE STATEMENT: Reports 3/10 resting pain 5-6/10 pain with walking/activity.  Has started using his bone stimulator.  PERTINENT HISTORY: 2. Chronic pain of both lower extremities 3. Leg fracture, left, sequela 4. Leg fracture, right, sequela - Referral to Orthopedic Surgery and Rehabilitation for evaluation/management.  - Follow-up with primary provider as scheduled.  - Ambulatory referral to Orthopedic Surgery - AMB referral to rehabilitation   PAIN:  Are you having pain? Yes: NPRS scale: 0-7/10 Pain location: L lower leg Pain description: ache  Aggravating factors: weight bearing, prolonged walking Relieving factors: rest and position changes  PRECAUTIONS: None  RED FLAGS: None   WEIGHT BEARING RESTRICTIONS: No  FALLS:  Has patient fallen in last 6 months? No  LIVING ENVIRONMENT: Lives with: lives with their family Lives in: House/apartment Stairs:  yes Has following equipment at home: Dan Humphreys - 2 wheeled  OCCUPATION: not working  PLOF: Independent with basic ADLs  PATIENT GOALS: To get around better  NEXT MD VISIT: TBD  OBJECTIVE:  Note: Objective measures were completed at Evaluation unless otherwise noted.  DIAGNOSTIC FINDINGS: IMPRESSION: 1. 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 Antibiotic laden methylmethacrylate along the mid  anterior tibial diaphysis within the tibial cortical defect. Small areas of bone bridging, but a majority of the fracture clefts persist without significant callus formation consistent with nonunion. 2. Oblique ununited fracture of the proximal fibular metadiaphysis without significant callus formation.     Electronically Signed   By: Elige Ko M.D.   On: 06/09/2023 09:03  PATIENT SURVEYS:  Patient-specific activity scoring scheme (Point to one number):  "0" represents "unable to perform." "10" represents "able to perform at prior level. 0 1 2 3 4 5 6 7 8 9  10 (Date and Score) Activity Initial  Activity Eval     Stair climbing  5    Arising from a chair  5    Prolonged walking  3    Additional Additional Total score = sum of the activity scores/number of activities Minimum detectable change (90%CI) for average score = 2 points Minimum detectable change (90%CI) for single activity score = 3 points PSFS developed by: Jake Seats., & Binkley, J. (1995). Assessing disability and change on individual  patients: a report of a patient specific measure. Physiotherapy [place], [phone], [phone]. Reproduced with the permission of the authors  Score: 13/30 43% perceived functional ability  COGNITION: Overall cognitive status: Within functional limits for tasks assessed    MUSCLE LENGTH: Hamstrings: Right 60 deg; Left 60 deg   POSTURE: No Significant postural limitations  PALPATION: deferred  LOWER EXTREMITY ROM:  A/PROM Right eval Left eval  Hip flexion    Hip extension    Hip abduction    Hip adduction    Hip internal rotation    Hip external rotation    Knee flexion    Knee extension -10/-2d -5/0d  Ankle dorsiflexion    Ankle plantarflexion    Ankle inversion    Ankle eversion     (Blank rows = not tested)  LOWER EXTREMITY MMT:  MMT Right eval Left eval  Hip flexion    Hip extension 4- 4-  Hip abduction    Hip adduction    Hip  internal rotation    Hip external rotation    Knee flexion    Knee extension 4- 4-  Ankle dorsiflexion    Ankle plantarflexion 4- 4-  Ankle inversion    Ankle eversion     (Blank rows = not tested)  LOWER EXTREMITY SPECIAL TESTS:  deferred  FUNCTIONAL TESTS:  30 seconds chair stand test 8 with UE assist needed  GAIT: Distance walked: 69ftx2 Assistive device utilized: Walker - 2 wheeled Level of assistance: Modified independence Comments: slow cadence  TREATMENT DATE:  [facility] Adult PT Treatment:                                                DATE: 07/16/23 Therapeutic Exercise: Nustep L 3 8 min Seated hamstring stretch 30s x2 B Neuromuscular re-ed: Bridge with ball squeeze 15x Bridge against RTB 15x Standing heel raises 15x FAQs with ball squeeze 15x Therapeutic Activity: SLR 15x B 2# SAQs 15x B 2# Supine hip fallouts RTB 15x B, 15/15 unilaterally S/L clams RTB 15/15  [facility] Adult PT Treatment:                                                DATE: 07/10/23 Therapeutic Exercise: Nustep L2 8 min Seated hamstring stretch 30s x2 B Neuromuscular re-ed: Bridge with ball squeeze 15x Bridge against RTB 15x Standing heel raises 15x Therapeutic Activity: SLR 15x B SAQs 15x B Supine hip fallouts RTB 15x B  [facility] Adult PT Treatment:                                                DATE: 07/02/23 Eval and HEP  Self Care: Additional minutes spent for educating on updated Therapeutic Home Exercise Program as well as comparing current status to condition at start of symptoms. This included exercises focusing on stretching, strengthening, with focus on eccentric aspects. Long term goals include an improvement in range of motion, strength, endurance as well as avoiding reinjury. Patient's frequency would include in 1-2 times a day, 3-5 times a week for a duration of  6-12 weeks. Proper technique shown and discussed handout in great detail. All questions were discussed and addressed.       PATIENT EDUCATION:  Education details: Discussed eval findings, rehab rationale and POC and patient is in agreement  Person educated: Patient and Parent Education method: Explanation Education comprehension: verbalized understanding and needs further education  HOME EXERCISE PROGRAM: Access Code: [email or web address] URL: [email or web address] Date: 07/02/2023 Prepared by: Gustavus Bryant  Exercises - Supine Knee Extension Strengthening  - 2 x daily - 5 x weekly - 1-2 sets - 15 reps - Supine Bridge  - 2 x daily - 5 x weekly - 1-2 sets - 15 reps - Seated Long Arc Quad  - 2 x daily - 5 x weekly - 1-2 sets - 15 reps - Heel Toe Raises with Counter Support  - 2 x daily - 5 x weekly - 1-2 sets - 15 reps - Seated Table Hamstring Stretch  - 2 x daily - 5 x weekly - 1 sets - 2 reps - 30s hold  ASSESSMENT:  CLINICAL IMPRESSION: Continued with BLE strengthening tasks, limiting WB tasks due to non-healed LLE fracture.  Added additional hip strengthening and stabilizing activities.  Patient able to ambulate short distances in clinic w/o RW but with marked gait deviation and avoidance of WB on LLE  Patient is a 63 y.o. male who was seen today for physical therapy evaluation and treatment for BLE weakness following traumatic fractures requiring ORIF. Patient presents with limited knee extension B, tight hamstrings and B LE weakness as  evidenced by 30s chair stand test.  Further testing of balance and endurance warranted at upcoming sessions.  OBJECTIVE IMPAIRMENTS: Abnormal gait, decreased activity tolerance, decreased coordination, decreased endurance, decreased knowledge of condition, decreased mobility, difficulty walking, decreased strength, increased fascial restrictions, improper body mechanics, and pain.   ACTIVITY LIMITATIONS: carrying, lifting, standing, squatting, and  stairs  PERSONAL FACTORS: Behavior pattern, Fitness, Past/current experiences, Time since onset of injury/illness/exacerbation, and Transportation are also affecting patient's functional outcome.   REHAB POTENTIAL: Fair based on needs   CLINICAL DECISION MAKING: Evolving/moderate complexity  EVALUATION COMPLEXITY: Moderate   GOALS: Goals reviewed with patient? No  SHORT TERM GOALS: Target date: 07/23/2023   Patient to demonstrate independence in HEP  Baseline: [email or web address] Goal status: INITIAL  2.  Assess 2 MWT Baseline: TBD Goal status: INITIAL  3.  Patient to negotiate 8 steps with most appropriate pattern Baseline: TBD Goal status: INITIAL   LONG TERM GOALS: Target date: 08/13/2023   Patient will acknowledge 4/10 worst pain at least once during episode of care   Baseline: 7/10 worst pain Goal status: INITIAL  2.  Patient will score at least 21/30 on PSFS to signify clinically meaningful improvement in functional abilities.   Baseline: 13/30 43% perceived functional ability Goal status: INITIAL  3.  Patient will increase 30s chair stand reps from 8 to 12 with arms to demonstrate and improved functional ability with less pain/difficulty as well as reduce fall risk.  Baseline: 8 Goal status: INITIAL  4.  Increase BLE strength to 4/5 Baseline:  MMT Right eval Left eval  Hip flexion    Hip extension 4- 4-  Hip abduction    Hip adduction    Hip internal rotation    Hip external rotation    Knee flexion    Knee extension 4- 4-  Ankle dorsiflexion    Ankle plantarflexion 4- 4-   Goal status: INITIAL  5.  Retest 2 MWT for progress made Baseline: TBD Goal status: INITIAL  6.  215ft ambulation with LRAD Baseline: 61ft with RW Goal status: INITIAL   PLAN:  PT FREQUENCY: 2x/week  PT DURATION: 6 weeks  PLANNED INTERVENTIONS: 97164- PT Re-evaluation, 97110-Therapeutic exercises, 97530- Therapeutic activity, 97112- Neuromuscular re-education, 97535- Self Care,  97140- Manual therapy, [phone]- Gait training, Patient/Family education, Balance training, and Stair training  PLAN FOR NEXT SESSION: HEP review and update, manual techniques as appropriate, aerobic tasks, ROM and flexibility activities, strengthening and PREs, TPDN, gait and balance training as needed   For all possible CPT codes, reference the Planned Interventions line above.     Check all conditions that are expected to impact treatment: {Conditions expected to impact treatment:Contractures, spasticity or fracture relevant to requested treatment and Social determinants of health   If treatment provided at initial evaluation, no treatment charged due to lack of authorization.       Hildred Laser, PT 07/16/2023, 2:45 PM

## 2023-07-11 NOTE — ED Notes (Signed)
 Patient Is discharging at this time. Printed AVS reviewed with patient along with follow up appointments and resources. Patient denies SI, HI, and A/V/H. Valuables/belongings returned to patient. No s/s of current distress.

## 2023-07-16 ENCOUNTER — Ambulatory Visit

## 2023-07-16 DIAGNOSIS — G8929 Other chronic pain: Secondary | ICD-10-CM

## 2023-07-16 DIAGNOSIS — S8292XS Unspecified fracture of left lower leg, sequela: Secondary | ICD-10-CM

## 2023-07-16 DIAGNOSIS — M79604 Pain in right leg: Secondary | ICD-10-CM | POA: Diagnosis not present

## 2023-07-16 DIAGNOSIS — S8291XS Unspecified fracture of right lower leg, sequela: Secondary | ICD-10-CM

## 2023-07-16 NOTE — Therapy (Unsigned)
 OUTPATIENT PHYSICAL THERAPY TREATMENT NOTE   Patient Name: [name] MRN: [id number] DOB:01-Apr-1961, 63 y.o., male Today's Date: 07/19/2023  END OF SESSION:  PT End of Session - 07/19/23 1316     Visit Number 4    Number of Visits 12    Date for PT Re-Evaluation 09/01/23    Authorization Type MCD    PT Start Time 1315    PT Stop Time 1400    PT Time Calculation (min) 45 min    Activity Tolerance Patient tolerated treatment well    Behavior During Therapy [facility] for tasks assessed/performed                Past Medical History:  Diagnosis Date   Alcohol use disorder 10/29/2022   Anxiety and depression    Bipolar 1 disorder (HCC)    Cocaine use disorder, mild, abuse (HCC) 12/31/2015   Tibia fracture 01/04/2023   Type I or II open fracture of left tibia and fibula 01/08/2023   Wernicke encephalopathy 10/29/2022   Past Surgical History:  Procedure Laterality Date   EYE SURGERY     I & D EXTREMITY Right 01/05/2023   Procedure: IRRIGATION AND DEBRIDEMENT RIGHT FOREARM;  Surgeon: Roby Lofts, MD;  Location: [facility] OR;  Service: Orthopedics;  Laterality: Right;   ORIF TIBIA FRACTURE Right 01/05/2023   Procedure: OPEN REDUCTION INTERNAL FIXATION (ORIF) TIBIA FRACTURE;  Surgeon: Roby Lofts, MD;  Location: [facility] OR;  Service: Orthopedics;  Laterality: Right;   TIBIA IM NAIL INSERTION Left 01/05/2023   Procedure: INTRAMEDULLARY (IM) NAIL TIBIAL;  Surgeon: Roby Lofts, MD;  Location: [facility] OR;  Service: Orthopedics;  Laterality: Left;   Patient Active Problem List   Diagnosis Date Noted   Bipolar disorder, in partial remission, most recent episode depressed (HCC) 01/08/2023   Type I or II open fracture of left tibia and fibula 01/08/2023   Tibia fracture 01/04/2023   Bipolar 1 disorder (HCC) 10/29/2022   Wernicke encephalopathy 10/29/2022   Alcohol use disorder 10/29/2022   Anxiety and depression    Bipolar affective disorder in remission (HCC) 01/16/2016   Cocaine use  disorder, mild, abuse (HCC) 12/31/2015    PCP: Rema Fendt, NP   REFERRING PROVIDER: Rema Fendt, NP  REFERRING DIAG: M79.604,M79.605,G89.29 (ICD-10-CM) - Chronic pain of both lower extremities S82.92XS (ICD-10-CM) - Leg fracture, left, sequela S82.91XS (ICD-10-CM) - Leg fracture, right, sequela  THERAPY DIAG:  Chronic pain of both lower extremities  Leg fracture, left, sequela  Leg fracture, right, sequela  Rationale for Evaluation and Treatment: Rehabilitation  ONSET DATE: chronic  SUBJECTIVE:   SUBJECTIVE STATEMENT: Still noting L lower leg pain/discomfort with WB tasks.  5/10 in intensity.  PERTINENT HISTORY: 2. Chronic pain of both lower extremities 3. Leg fracture, left, sequela 4. Leg fracture, right, sequela - Referral to Orthopedic Surgery and Rehabilitation for evaluation/management.  - Follow-up with primary provider as scheduled.  - Ambulatory referral to Orthopedic Surgery - AMB referral to rehabilitation   PAIN:  Are you having pain? Yes: NPRS scale: 0-7/10 Pain location: L lower leg Pain description: ache  Aggravating factors: weight bearing, prolonged walking Relieving factors: rest and position changes  PRECAUTIONS: None  RED FLAGS: None   WEIGHT BEARING RESTRICTIONS: No  FALLS:  Has patient fallen in last 6 months? No  LIVING ENVIRONMENT: Lives with: lives with their family Lives in: House/apartment Stairs:  yes Has following equipment at home: Dan Humphreys - 2 wheeled  OCCUPATION: not working  PLOF: Independent with basic ADLs  PATIENT GOALS: To get around better  NEXT MD VISIT: TBD  OBJECTIVE:  Note: Objective measures were completed at Evaluation unless otherwise noted.  DIAGNOSTIC FINDINGS: IMPRESSION: 1. 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 Antibiotic laden methylmethacrylate along the mid anterior tibial diaphysis within  the tibial cortical defect. Small areas of bone bridging, but a majority of the fracture clefts persist without significant callus formation consistent with nonunion. 2. Oblique ununited fracture of the proximal fibular metadiaphysis without significant callus formation.     Electronically Signed   By: Elige Ko M.D.   On: 06/09/2023 09:03  PATIENT SURVEYS:  Patient-specific activity scoring scheme (Point to one number):  "0" represents "unable to perform." "10" represents "able to perform at prior level. 0 1 2 3 4 5 6 7 8 9  10 (Date and Score) Activity Initial  Activity Eval     Stair climbing  5    Arising from a chair  5    Prolonged walking  3    Additional Additional Total score = sum of the activity scores/number of activities Minimum detectable change (90%CI) for average score = 2 points Minimum detectable change (90%CI) for single activity score = 3 points PSFS developed by: Jake Seats., & Binkley, J. (1995). Assessing disability and change on individual  patients: a report of a patient specific measure. Physiotherapy [place], 47, [phone]. Reproduced with the permission of the authors  Score: 13/30 43% perceived functional ability  COGNITION: Overall cognitive status: Within functional limits for tasks assessed    MUSCLE LENGTH: Hamstrings: Right 60 deg; Left 60 deg   POSTURE: No Significant postural limitations  PALPATION: deferred  LOWER EXTREMITY ROM:  A/PROM Right eval Left eval  Hip flexion    Hip extension    Hip abduction    Hip adduction    Hip internal rotation    Hip external rotation    Knee flexion    Knee extension -10/-2d -5/0d  Ankle dorsiflexion    Ankle plantarflexion    Ankle inversion    Ankle eversion     (Blank rows = not tested)  LOWER EXTREMITY MMT:  MMT Right eval Left eval  Hip flexion    Hip extension 4- 4-  Hip abduction    Hip adduction    Hip internal rotation    Hip external  rotation    Knee flexion    Knee extension 4- 4-  Ankle dorsiflexion    Ankle plantarflexion 4- 4-  Ankle inversion    Ankle eversion     (Blank rows = not tested)  LOWER EXTREMITY SPECIAL TESTS:  deferred  FUNCTIONAL TESTS:  30 seconds chair stand test 8 with UE assist needed  GAIT: Distance walked: 68ftx2 Assistive device utilized: Walker - 2 wheeled Level of assistance: Modified independence Comments: slow cadence  TREATMENT DATE:  [facility] Adult PT Treatment:                                                DATE: 07/19/23 Therapeutic Exercise: Nustep L4 8 min Seated hamstring stretch 30s x2 B Neuromuscular re-ed: Bridge with ball squeeze 15x Bridge against GTB 10x Standing heel raises 15x FAQs with ball squeeze 15x2 Therapeutic Activity: SLR 15x B 3# SAQs 15x2 B 3# Supine hip fallouts GTB 10x B, 10/10 unilaterally S/L clams GTB 10/10  [facility] Adult PT Treatment:                                                DATE: 07/16/23 Therapeutic Exercise: Nustep L 3 8 min Seated hamstring stretch 30s x2 B Neuromuscular re-ed: Bridge with ball squeeze 15x Bridge against RTB 15x Standing heel raises 15x FAQs with ball squeeze 15x Therapeutic Activity: SLR 15x B 2# SAQs 15x B 2# Supine hip fallouts RTB 15x B, 15/15 unilaterally S/L clams RTB 15/15  [facility] Adult PT Treatment:                                                DATE: 07/10/23 Therapeutic Exercise: Nustep L2 8 min Seated hamstring stretch 30s x2 B Neuromuscular re-ed: Bridge with ball squeeze 15x Bridge against RTB 15x Standing heel raises 15x Therapeutic Activity: SLR 15x B SAQs 15x B Supine hip fallouts RTB 15x B  [facility] Adult PT Treatment:                                                DATE: 07/02/23 Eval and HEP  Self Care: Additional minutes spent for educating on updated Therapeutic  Home Exercise Program as well as comparing current status to condition at start of symptoms. This included exercises focusing on stretching, strengthening, with focus on eccentric aspects. Long term goals include an improvement in range of motion, strength, endurance as well as avoiding reinjury. Patient's frequency would include in 1-2 times a day, 3-5 times a week for a duration of 6-12 weeks. Proper technique shown and discussed handout in great detail. All questions were discussed and addressed.       PATIENT EDUCATION:  Education details: Discussed eval findings, rehab rationale and POC and patient is in agreement  Person educated: Patient and Parent Education method: Explanation Education comprehension: verbalized understanding and needs further education  HOME EXERCISE PROGRAM: Access Code: [email or web address] URL: [email or web address] Date: 07/02/2023 Prepared by: Gustavus Bryant  Exercises - Supine Knee Extension Strengthening  - 2 x daily - 5 x weekly - 1-2 sets - 15 reps - Supine Bridge  - 2 x daily - 5 x weekly - 1-2 sets - 15 reps - Seated Long Arc Quad  - 2 x daily - 5 x weekly - 1-2 sets - 15 reps - Heel Toe Raises with Counter Support  - 2 x daily - 5 x weekly - 1-2 sets - 15 reps - Seated  Table Hamstring Stretch  - 2 x daily - 5 x weekly - 1 sets - 2 reps - 30s hold  ASSESSMENT:  CLINICAL IMPRESSION: Increased weight, resistance and reps as noted.  Fatigue reported with added workload but no pain.  MD messaged for fracture status and clearance to advance to cane.   Patient is a 63 y.o. male who was seen today for physical therapy evaluation and treatment for BLE weakness following traumatic fractures requiring ORIF. Patient presents with limited knee extension B, tight hamstrings and B LE weakness as evidenced by 30s chair stand test.  Further testing of balance and endurance warranted at upcoming sessions.  OBJECTIVE IMPAIRMENTS: Abnormal gait, decreased activity  tolerance, decreased coordination, decreased endurance, decreased knowledge of condition, decreased mobility, difficulty walking, decreased strength, increased fascial restrictions, improper body mechanics, and pain.   ACTIVITY LIMITATIONS: carrying, lifting, standing, squatting, and stairs  PERSONAL FACTORS: Behavior pattern, Fitness, Past/current experiences, Time since onset of injury/illness/exacerbation, and Transportation are also affecting patient's functional outcome.   REHAB POTENTIAL: Fair based on needs   CLINICAL DECISION MAKING: Evolving/moderate complexity  EVALUATION COMPLEXITY: Moderate   GOALS: Goals reviewed with patient? No  SHORT TERM GOALS: Target date: 07/23/2023   Patient to demonstrate independence in HEP  Baseline: [email or web address] Goal status: INITIAL  2.  Assess 2 MWT Baseline: TBD Goal status: INITIAL  3.  Patient to negotiate 8 steps with most appropriate pattern Baseline: TBD Goal status: INITIAL   LONG TERM GOALS: Target date: 08/13/2023   Patient will acknowledge 4/10 worst pain at least once during episode of care   Baseline: 7/10 worst pain Goal status: INITIAL  2.  Patient will score at least 21/30 on PSFS to signify clinically meaningful improvement in functional abilities.   Baseline: 13/30 43% perceived functional ability Goal status: INITIAL  3.  Patient will increase 30s chair stand reps from 8 to 12 with arms to demonstrate and improved functional ability with less pain/difficulty as well as reduce fall risk.  Baseline: 8 Goal status: INITIAL  4.  Increase BLE strength to 4/5 Baseline:  MMT Right eval Left eval  Hip flexion    Hip extension 4- 4-  Hip abduction    Hip adduction    Hip internal rotation    Hip external rotation    Knee flexion    Knee extension 4- 4-  Ankle dorsiflexion    Ankle plantarflexion 4- 4-   Goal status: INITIAL  5.  Retest 2 MWT for progress made Baseline: TBD Goal status: INITIAL  6.  286ft  ambulation with LRAD Baseline: 69ft with RW Goal status: INITIAL   PLAN:  PT FREQUENCY: 2x/week  PT DURATION: 6 weeks  PLANNED INTERVENTIONS: 97164- PT Re-evaluation, 97110-Therapeutic exercises, 97530- Therapeutic activity, 97112- Neuromuscular re-education, 97535- Self Care, 54098- Manual therapy, [phone]- Gait training, Patient/Family education, Balance training, and Stair training  PLAN FOR NEXT SESSION: HEP review and update, manual techniques as appropriate, aerobic tasks, ROM and flexibility activities, strengthening and PREs, TPDN, gait and balance training as needed   For all possible CPT codes, reference the Planned Interventions line above.     Check all conditions that are expected to impact treatment: {Conditions expected to impact treatment:Contractures, spasticity or fracture relevant to requested treatment and Social determinants of health   If treatment provided at initial evaluation, no treatment charged due to lack of authorization.       Hildred Laser, PT 07/19/2023, 2:01 PM

## 2023-07-18 ENCOUNTER — Ambulatory Visit (HOSPITAL_COMMUNITY): Admitting: Psychiatry

## 2023-07-19 ENCOUNTER — Ambulatory Visit: Attending: Family

## 2023-07-19 DIAGNOSIS — M79604 Pain in right leg: Secondary | ICD-10-CM | POA: Diagnosis present

## 2023-07-19 DIAGNOSIS — G8929 Other chronic pain: Secondary | ICD-10-CM | POA: Insufficient documentation

## 2023-07-19 DIAGNOSIS — S8291XS Unspecified fracture of right lower leg, sequela: Secondary | ICD-10-CM | POA: Diagnosis present

## 2023-07-19 DIAGNOSIS — M79605 Pain in left leg: Secondary | ICD-10-CM | POA: Diagnosis present

## 2023-07-19 DIAGNOSIS — S8292XS Unspecified fracture of left lower leg, sequela: Secondary | ICD-10-CM | POA: Diagnosis present

## 2023-07-23 ENCOUNTER — Ambulatory Visit: Payer: 59 | Attending: Cardiology | Admitting: Cardiology

## 2023-07-23 ENCOUNTER — Encounter: Payer: Self-pay | Admitting: Cardiology

## 2023-07-23 VITALS — BP 108/68 | HR 62 | Ht 70.0 in | Wt 155.6 lb

## 2023-07-23 DIAGNOSIS — I10 Essential (primary) hypertension: Secondary | ICD-10-CM

## 2023-07-23 NOTE — Patient Instructions (Signed)
 Medication Instructions:  Your physician recommends that you continue on your current medications as directed. Please refer to the Current Medication list given to you today.  *If you need a refill on your cardiac medications before your next appointment, please call your pharmacy*  Lab Work: NONE If you have labs (blood work) drawn today and your tests are completely normal, you will receive your results only by: MyChart Message (if you have MyChart) OR A paper copy in the mail If you have any lab test that is abnormal or we need to change your treatment, we will call you to review the results.  Testing/Procedures: Coronary Calcium Score Your physician has requested that you have a coronary calcium score performed. This is not covered by insurance and will be an out-of-pocket cost of approximately $99.   Follow-Up: At [facility], you and your health needs are our priority.  As part of our continuing mission to provide you with exceptional heart care, our providers are all part of one team.  This team includes your primary Cardiologist (physician) and Advanced Practice Providers or APPs (Physician Assistants and Nurse Practitioners) who all work together to provide you with the care you need, when you need it.  Your next appointment:   As needed  Provider:   Mayford Knife, MD  We recommend signing up for the patient portal called "MyChart".  Sign up information is provided on this After Visit Summary.  MyChart is used to connect with patients for Virtual Visits (Telemedicine).  Patients are able to view lab/test results, encounter notes, upcoming appointments, etc.  Non-urgent messages can be sent to your provider as well.   To learn more about what you can do with MyChart, go to [email or web address].   Other Instructions       1st Floor: - Lobby - Registration  - Pharmacy  - Lab - Cafe  2nd Floor: - PV Lab - Diagnostic Testing (echo, CT, nuclear med)  3rd Floor: -  Vacant  4th Floor: - TCTS (cardiothoracic surgery) - AFib Clinic - Structural Heart Clinic - Vascular Surgery  - Vascular Ultrasound  5th Floor: - HeartCare Cardiology (general and EP) - Clinical Pharmacy for coumadin, hypertension, lipid, weight-loss medications, and med management appointments    Valet parking services will be available as well.
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
## 2023-07-23 NOTE — Therapy (Unsigned)
 OUTPATIENT PHYSICAL THERAPY TREATMENT NOTE   Patient Name: [name] MRN: [id number] DOB:1961-03-05, 63 y.o., male Today's Date: 07/24/2023  END OF SESSION:  PT End of Session - 07/24/23 1315     Visit Number 5    Number of Visits 12    Date for PT Re-Evaluation 09/01/23    Authorization Type MCD    PT Start Time 1315    PT Stop Time 1400    PT Time Calculation (min) 45 min    Activity Tolerance Patient tolerated treatment well    Behavior During Therapy [facility] for tasks assessed/performed                 Past Medical History:  Diagnosis Date   Alcohol use disorder 10/29/2022   Anxiety and depression    Bipolar 1 disorder (HCC)    Cocaine use disorder, mild, abuse (HCC) 12/31/2015   Tibia fracture 01/04/2023   Type I or II open fracture of left tibia and fibula 01/08/2023   Wernicke encephalopathy 10/29/2022   Past Surgical History:  Procedure Laterality Date   EYE SURGERY     I & D EXTREMITY Right 01/05/2023   Procedure: IRRIGATION AND DEBRIDEMENT RIGHT FOREARM;  Surgeon: Roby Lofts, MD;  Location: [facility] OR;  Service: Orthopedics;  Laterality: Right;   ORIF TIBIA FRACTURE Right 01/05/2023   Procedure: OPEN REDUCTION INTERNAL FIXATION (ORIF) TIBIA FRACTURE;  Surgeon: Roby Lofts, MD;  Location: [facility] OR;  Service: Orthopedics;  Laterality: Right;   TIBIA IM NAIL INSERTION Left 01/05/2023   Procedure: INTRAMEDULLARY (IM) NAIL TIBIAL;  Surgeon: Roby Lofts, MD;  Location: [facility] OR;  Service: Orthopedics;  Laterality: Left;   Patient Active Problem List   Diagnosis Date Noted   Bipolar disorder, in partial remission, most recent episode depressed (HCC) 01/08/2023   Type I or II open fracture of left tibia and fibula 01/08/2023   Tibia fracture 01/04/2023   Bipolar 1 disorder (HCC) 10/29/2022   Wernicke encephalopathy 10/29/2022   Alcohol use disorder 10/29/2022   Anxiety and depression    Bipolar affective disorder in remission (HCC) 01/16/2016   Cocaine  use disorder, mild, abuse (HCC) 12/31/2015    PCP: Rema Fendt, NP   REFERRING PROVIDER: Rema Fendt, NP  REFERRING DIAG: M79.604,M79.605,G89.29 (ICD-10-CM) - Chronic pain of both lower extremities S82.92XS (ICD-10-CM) - Leg fracture, left, sequela S82.91XS (ICD-10-CM) - Leg fracture, right, sequela  THERAPY DIAG:  Chronic pain of both lower extremities  Leg fracture, left, sequela  Leg fracture, right, sequela  Rationale for Evaluation and Treatment: Rehabilitation  ONSET DATE: chronic  SUBJECTIVE:   SUBJECTIVE STATEMENT: Has been trying to wean of of walker at home using canes on hand.  No exacerbation of symptoms reported.  MD approved transition to [facility]  PERTINENT HISTORY: 2. Chronic pain of both lower extremities 3. Leg fracture, left, sequela 4. Leg fracture, right, sequela - Referral to Orthopedic Surgery and Rehabilitation for evaluation/management.  - Follow-up with primary provider as scheduled.  - Ambulatory referral to Orthopedic Surgery - AMB referral to rehabilitation   PAIN:  Are you having pain? Yes: NPRS scale: 0-7/10 Pain location: L lower leg Pain description: ache  Aggravating factors: weight bearing, prolonged walking Relieving factors: rest and position changes  PRECAUTIONS: None  RED FLAGS: None   WEIGHT BEARING RESTRICTIONS: No  FALLS:  Has patient fallen in last 6 months? No  LIVING ENVIRONMENT: Lives with: lives with their family Lives in: House/apartment Stairs:  yes  Has following equipment at home: Dan Humphreys - 2 wheeled  OCCUPATION: not working  PLOF: Independent with basic ADLs  PATIENT GOALS: To get around better  NEXT MD VISIT: TBD  OBJECTIVE:  Note: Objective measures were completed at Evaluation unless otherwise noted.  DIAGNOSTIC FINDINGS: IMPRESSION: 1. 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 Antibiotic laden  methylmethacrylate along the mid anterior tibial diaphysis within the tibial cortical defect. Small areas of bone bridging, but a majority of the fracture clefts persist without significant callus formation consistent with nonunion. 2. Oblique ununited fracture of the proximal fibular metadiaphysis without significant callus formation.     Electronically Signed   By: Elige Ko M.D.   On: 06/09/2023 09:03  PATIENT SURVEYS:  Patient-specific activity scoring scheme (Point to one number):  "0" represents "unable to perform." "10" represents "able to perform at prior level. 0 1 2 3 4 5 6 7 8 9  10 (Date and Score) Activity Initial  Activity Eval     Stair climbing  5    Arising from a chair  5    Prolonged walking  3    Additional Additional Total score = sum of the activity scores/number of activities Minimum detectable change (90%CI) for average score = 2 points Minimum detectable change (90%CI) for single activity score = 3 points PSFS developed by: Jake Seats., & Binkley, J. (1995). Assessing disability and change on individual  patients: a report of a patient specific measure. Physiotherapy [place], 47, [phone]. Reproduced with the permission of the authors  Score: 13/30 43% perceived functional ability  COGNITION: Overall cognitive status: Within functional limits for tasks assessed    MUSCLE LENGTH: Hamstrings: Right 60 deg; Left 60 deg   POSTURE: No Significant postural limitations  PALPATION: deferred  LOWER EXTREMITY ROM:  A/PROM Right eval Left eval  Hip flexion    Hip extension    Hip abduction    Hip adduction    Hip internal rotation    Hip external rotation    Knee flexion    Knee extension -10/-2d -5/0d  Ankle dorsiflexion    Ankle plantarflexion    Ankle inversion    Ankle eversion     (Blank rows = not tested)  LOWER EXTREMITY MMT:  MMT Right eval Left eval  Hip flexion    Hip extension 4- 4-  Hip  abduction    Hip adduction    Hip internal rotation    Hip external rotation    Knee flexion    Knee extension 4- 4-  Ankle dorsiflexion    Ankle plantarflexion 4- 4-  Ankle inversion    Ankle eversion     (Blank rows = not tested)  LOWER EXTREMITY SPECIAL TESTS:  deferred  FUNCTIONAL TESTS:  30 seconds chair stand test 8 with UE assist needed  GAIT: Distance walked: 46ftx2 Assistive device utilized: Walker - 2 wheeled Level of assistance: Modified independence Comments: slow cadence  TREATMENT DATE:  [facility] Adult PT Treatment:                                                DATE: 07/24/23 Therapeutic Exercise: Nustep L4 8 min Seated hamstring stretch 30s x2 B Neuromuscular re-ed: Bridge with ball squeeze 15x Bridge against GTB 15x Standing heel raises 10x 4in block FAQs with ball squeeze 15x2 Gait with SPC 266ft Therapeutic Activity: Supine hip fallouts GTB 15x B, 15/15 unilaterally S/L clams GTB 15/15 10x STS from airex with elevated surface  [facility] Adult PT Treatment:                                                DATE: 07/19/23 Therapeutic Exercise: Nustep L4 8 min Seated hamstring stretch 30s x2 B Neuromuscular re-ed: Bridge with ball squeeze 15x Bridge against GTB 10x Standing heel raises 15x FAQs with ball squeeze 15x2 Therapeutic Activity: SLR 15x B 3# SAQs 15x2 B 3# Supine hip fallouts GTB 10x B, 10/10 unilaterally S/L clams GTB 10/10  [facility] Adult PT Treatment:                                                DATE: 07/16/23 Therapeutic Exercise: Nustep L 3 8 min Seated hamstring stretch 30s x2 B Neuromuscular re-ed: Bridge with ball squeeze 15x Bridge against RTB 15x Standing heel raises 15x FAQs with ball squeeze 15x Therapeutic Activity: SLR 15x B 2# SAQs 15x B 2# Supine hip fallouts RTB 15x B, 15/15 unilaterally S/L clams  RTB 15/15  [facility] Adult PT Treatment:                                                DATE: 07/10/23 Therapeutic Exercise: Nustep L2 8 min Seated hamstring stretch 30s x2 B Neuromuscular re-ed: Bridge with ball squeeze 15x Bridge against RTB 15x Standing heel raises 15x Therapeutic Activity: SLR 15x B SAQs 15x B Supine hip fallouts RTB 15x B  [facility] Adult PT Treatment:                                                DATE: 07/02/23 Eval and HEP  Self Care: Additional minutes spent for educating on updated Therapeutic Home Exercise Program as well as comparing current status to condition at start of symptoms. This included exercises focusing on stretching, strengthening, with focus on eccentric aspects. Long term goals include an improvement in range of motion, strength, endurance as well as avoiding reinjury. Patient's frequency would include in 1-2 times a day, 3-5 times a week for a duration of 6-12 weeks. Proper technique shown and discussed handout in great detail. All questions were discussed and addressed.       PATIENT EDUCATION:  Education details: Discussed eval findings, rehab rationale and POC and patient is in agreement  Person educated:  Patient and Parent Education method: Explanation Education comprehension: verbalized understanding and needs further education  HOME EXERCISE PROGRAM: Access Code: [email or web address] URL: [email or web address] Date: 07/02/2023 Prepared by: Gustavus Bryant  Exercises - Supine Knee Extension Strengthening  - 2 x daily - 5 x weekly - 1-2 sets - 15 reps - Supine Bridge  - 2 x daily - 5 x weekly - 1-2 sets - 15 reps - Seated Long Arc Quad  - 2 x daily - 5 x weekly - 1-2 sets - 15 reps - Heel Toe Raises with Counter Support  - 2 x daily - 5 x weekly - 1-2 sets - 15 reps - Seated Table Hamstring Stretch  - 2 x daily - 5 x weekly - 1 sets - 2 reps - 30s hold  ASSESSMENT:  CLINICAL IMPRESSION: Continued to add resistance and difficulty to  exercises, challenging patient.  Advanced to gait with cane as per MD approval.  Patient able to ambulate 259ft with good cadence and step though pattern advancing cane appropriately.  Patient is a 63 y.o. male who was seen today for physical therapy evaluation and treatment for BLE weakness following traumatic fractures requiring ORIF. Patient presents with limited knee extension B, tight hamstrings and B LE weakness as evidenced by 30s chair stand test.  Further testing of balance and endurance warranted at upcoming sessions.  OBJECTIVE IMPAIRMENTS: Abnormal gait, decreased activity tolerance, decreased coordination, decreased endurance, decreased knowledge of condition, decreased mobility, difficulty walking, decreased strength, increased fascial restrictions, improper body mechanics, and pain.   ACTIVITY LIMITATIONS: carrying, lifting, standing, squatting, and stairs  PERSONAL FACTORS: Behavior pattern, Fitness, Past/current experiences, Time since onset of injury/illness/exacerbation, and Transportation are also affecting patient's functional outcome.   REHAB POTENTIAL: Fair based on needs   CLINICAL DECISION MAKING: Evolving/moderate complexity  EVALUATION COMPLEXITY: Moderate   GOALS: Goals reviewed with patient? No  SHORT TERM GOALS: Target date: 07/23/2023   Patient to demonstrate independence in HEP  Baseline: [id number] Goal status: INITIAL  2.  Assess 2 MWT Baseline: TBD Goal status: INITIAL  3.  Patient to negotiate 8 steps with most appropriate pattern Baseline: TBD Goal status: INITIAL   LONG TERM GOALS: Target date: 08/13/2023   Patient will acknowledge 4/10 worst pain at least once during episode of care   Baseline: 7/10 worst pain Goal status: INITIAL  2.  Patient will score at least 21/30 on PSFS to signify clinically meaningful improvement in functional abilities.   Baseline: 13/30 43% perceived functional ability Goal status: INITIAL  3.  Patient will  increase 30s chair stand reps from 8 to 12 with arms to demonstrate and improved functional ability with less pain/difficulty as well as reduce fall risk.  Baseline: 8 Goal status: INITIAL  4.  Increase BLE strength to 4/5 Baseline:  MMT Right eval Left eval  Hip flexion    Hip extension 4- 4-  Hip abduction    Hip adduction    Hip internal rotation    Hip external rotation    Knee flexion    Knee extension 4- 4-  Ankle dorsiflexion    Ankle plantarflexion 4- 4-   Goal status: INITIAL  5.  Retest 2 MWT for progress made Baseline: TBD Goal status: INITIAL  6.  277ft ambulation with LRAD Baseline: 59ft with RW Goal status: INITIAL   PLAN:  PT FREQUENCY: 2x/week  PT DURATION: 6 weeks  PLANNED INTERVENTIONS: 97164- PT Re-evaluation, 97110-Therapeutic exercises, 97530- Therapeutic activity, [id number]- Neuromuscular re-education, 97535- Self Care, [id number]- Manual  therapy, [phone]- Gait training, Patient/Family education, Balance training, and Stair training  PLAN FOR NEXT SESSION: HEP review and update, manual techniques as appropriate, aerobic tasks, ROM and flexibility activities, strengthening and PREs, TPDN, gait and balance training as needed   For all possible CPT codes, reference the Planned Interventions line above.     Check all conditions that are expected to impact treatment: {Conditions expected to impact treatment:Contractures, spasticity or fracture relevant to requested treatment and Social determinants of health   If treatment provided at initial evaluation, no treatment charged due to lack of authorization.       Hildred Laser, PT 07/24/2023, 2:26 PM

## 2023-07-23 NOTE — Addendum Note (Signed)
 Addended by: Erick Alley on: 07/23/2023 01:29 PM   Modules accepted: Orders

## 2023-07-24 ENCOUNTER — Ambulatory Visit

## 2023-07-24 DIAGNOSIS — S8291XS Unspecified fracture of right lower leg, sequela: Secondary | ICD-10-CM

## 2023-07-24 DIAGNOSIS — S8292XS Unspecified fracture of left lower leg, sequela: Secondary | ICD-10-CM

## 2023-07-24 DIAGNOSIS — G8929 Other chronic pain: Secondary | ICD-10-CM

## 2023-07-24 DIAGNOSIS — M79604 Pain in right leg: Secondary | ICD-10-CM | POA: Diagnosis not present

## 2023-07-25 NOTE — Therapy (Unsigned)
 OUTPATIENT PHYSICAL THERAPY TREATMENT NOTE   Patient Name: [name] MRN: [id number] DOB:March 10, 1961, 63 y.o., male Today's Date: 07/25/2023  END OF SESSION:        Past Medical History:  Diagnosis Date   Alcohol use disorder 10/29/2022   Anxiety and depression    Bipolar 1 disorder (HCC)    Cocaine use disorder, mild, abuse (HCC) 12/31/2015   Tibia fracture 01/04/2023   Type I or II open fracture of left tibia and fibula 01/08/2023   Wernicke encephalopathy 10/29/2022   Past Surgical History:  Procedure Laterality Date   EYE SURGERY     I & D EXTREMITY Right 01/05/2023   Procedure: IRRIGATION AND DEBRIDEMENT RIGHT FOREARM;  Surgeon: Roby Lofts, MD;  Location: [facility] OR;  Service: Orthopedics;  Laterality: Right;   ORIF TIBIA FRACTURE Right 01/05/2023   Procedure: OPEN REDUCTION INTERNAL FIXATION (ORIF) TIBIA FRACTURE;  Surgeon: Roby Lofts, MD;  Location: [facility] OR;  Service: Orthopedics;  Laterality: Right;   TIBIA IM NAIL INSERTION Left 01/05/2023   Procedure: INTRAMEDULLARY (IM) NAIL TIBIAL;  Surgeon: Roby Lofts, MD;  Location: [facility] OR;  Service: Orthopedics;  Laterality: Left;   Patient Active Problem List   Diagnosis Date Noted   Bipolar disorder, in partial remission, most recent episode depressed (HCC) 01/08/2023   Type I or II open fracture of left tibia and fibula 01/08/2023   Tibia fracture 01/04/2023   Bipolar 1 disorder (HCC) 10/29/2022   Wernicke encephalopathy 10/29/2022   Alcohol use disorder 10/29/2022   Anxiety and depression    Bipolar affective disorder in remission (HCC) 01/16/2016   Cocaine use disorder, mild, abuse (HCC) 12/31/2015    PCP: Rema Fendt, NP   REFERRING PROVIDER: Rema Fendt, NP  REFERRING DIAG: M79.604,M79.605,G89.29 (ICD-10-CM) - Chronic pain of both lower extremities S82.92XS (ICD-10-CM) - Leg fracture, left, sequela S82.91XS (ICD-10-CM) - Leg fracture, right, sequela  THERAPY DIAG:  No diagnosis  found.  Rationale for Evaluation and Treatment: Rehabilitation  ONSET DATE: chronic  SUBJECTIVE:   SUBJECTIVE STATEMENT: Has been trying to wean of of walker at home using canes on hand.  No exacerbation of symptoms reported.  MD approved transition to [facility]  PERTINENT HISTORY: 2. Chronic pain of both lower extremities 3. Leg fracture, left, sequela 4. Leg fracture, right, sequela - Referral to Orthopedic Surgery and Rehabilitation for evaluation/management.  - Follow-up with primary provider as scheduled.  - Ambulatory referral to Orthopedic Surgery - AMB referral to rehabilitation   PAIN:  Are you having pain? Yes: NPRS scale: 0-7/10 Pain location: L lower leg Pain description: ache  Aggravating factors: weight bearing, prolonged walking Relieving factors: rest and position changes  PRECAUTIONS: None  RED FLAGS: None   WEIGHT BEARING RESTRICTIONS: No  FALLS:  Has patient fallen in last 6 months? No  LIVING ENVIRONMENT: Lives with: lives with their family Lives in: House/apartment Stairs:  yes Has following equipment at home: Dan Humphreys - 2 wheeled  OCCUPATION: not working  PLOF: Independent with basic ADLs  PATIENT GOALS: To get around better  NEXT MD VISIT: TBD  OBJECTIVE:  Note: Objective measures were completed at Evaluation unless otherwise noted.  DIAGNOSTIC FINDINGS: IMPRESSION: 1. 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 Antibiotic laden methylmethacrylate along the mid anterior tibial diaphysis within the tibial cortical defect. Small areas of bone bridging, but a majority of the fracture clefts persist without significant callus formation consistent with nonunion.  2. Oblique ununited fracture of the proximal fibular metadiaphysis without significant callus formation.     Electronically Signed   By: Elige Ko M.D.   On: 06/09/2023 09:03  PATIENT  SURVEYS:  Patient-specific activity scoring scheme (Point to one number):  "0" represents "unable to perform." "10" represents "able to perform at prior level. 0 1 2 3 4 5 6 7 8 9  10 (Date and Score) Activity Initial  Activity Eval     Stair climbing  5    Arising from a chair  5    Prolonged walking  3    Additional Additional Total score = sum of the activity scores/number of activities Minimum detectable change (90%CI) for average score = 2 points Minimum detectable change (90%CI) for single activity score = 3 points PSFS developed by: Jake Seats., & Binkley, J. (1995). Assessing disability and change on individual  patients: a report of a patient specific measure. Physiotherapy [place], 47, [phone]. Reproduced with the permission of the authors  Score: 13/30 43% perceived functional ability  COGNITION: Overall cognitive status: Within functional limits for tasks assessed    MUSCLE LENGTH: Hamstrings: Right 60 deg; Left 60 deg   POSTURE: No Significant postural limitations  PALPATION: deferred  LOWER EXTREMITY ROM:  A/PROM Right eval Left eval  Hip flexion    Hip extension    Hip abduction    Hip adduction    Hip internal rotation    Hip external rotation    Knee flexion    Knee extension -10/-2d -5/0d  Ankle dorsiflexion    Ankle plantarflexion    Ankle inversion    Ankle eversion     (Blank rows = not tested)  LOWER EXTREMITY MMT:  MMT Right eval Left eval  Hip flexion    Hip extension 4- 4-  Hip abduction    Hip adduction    Hip internal rotation    Hip external rotation    Knee flexion    Knee extension 4- 4-  Ankle dorsiflexion    Ankle plantarflexion 4- 4-  Ankle inversion    Ankle eversion     (Blank rows = not tested)  LOWER EXTREMITY SPECIAL TESTS:  deferred  FUNCTIONAL TESTS:  30 seconds chair stand test 8 with UE assist needed  GAIT: Distance walked: 29ftx2 Assistive device utilized: Walker - 2  wheeled Level of assistance: Modified independence Comments: slow cadence                                                                                                                                TREATMENT DATE:  [facility] Adult PT Treatment:                                                DATE: 07/24/23 Therapeutic  Exercise: Nustep L4 8 min Seated hamstring stretch 30s x2 B Neuromuscular re-ed: Bridge with ball squeeze 15x Bridge against GTB 15x Standing heel raises 10x 4in block FAQs with ball squeeze 15x2 Gait with SPC 276ft Therapeutic Activity: Supine hip fallouts GTB 15x B, 15/15 unilaterally S/L clams GTB 15/15 10x STS from airex with elevated surface  [facility] Adult PT Treatment:                                                DATE: 07/19/23 Therapeutic Exercise: Nustep L4 8 min Seated hamstring stretch 30s x2 B Neuromuscular re-ed: Bridge with ball squeeze 15x Bridge against GTB 10x Standing heel raises 15x FAQs with ball squeeze 15x2 Therapeutic Activity: SLR 15x B 3# SAQs 15x2 B 3# Supine hip fallouts GTB 10x B, 10/10 unilaterally S/L clams GTB 10/10  [facility] Adult PT Treatment:                                                DATE: 07/16/23 Therapeutic Exercise: Nustep L 3 8 min Seated hamstring stretch 30s x2 B Neuromuscular re-ed: Bridge with ball squeeze 15x Bridge against RTB 15x Standing heel raises 15x FAQs with ball squeeze 15x Therapeutic Activity: SLR 15x B 2# SAQs 15x B 2# Supine hip fallouts RTB 15x B, 15/15 unilaterally S/L clams RTB 15/15  [facility] Adult PT Treatment:                                                DATE: 07/10/23 Therapeutic Exercise: Nustep L2 8 min Seated hamstring stretch 30s x2 B Neuromuscular re-ed: Bridge with ball squeeze 15x Bridge against RTB 15x Standing heel raises 15x Therapeutic Activity: SLR 15x B SAQs 15x B Supine hip fallouts RTB 15x B  [facility] Adult PT Treatment:                                                DATE:  07/02/23 Eval and HEP  Self Care: Additional minutes spent for educating on updated Therapeutic Home Exercise Program as well as comparing current status to condition at start of symptoms. This included exercises focusing on stretching, strengthening, with focus on eccentric aspects. Long term goals include an improvement in range of motion, strength, endurance as well as avoiding reinjury. Patient's frequency would include in 1-2 times a day, 3-5 times a week for a duration of 6-12 weeks. Proper technique shown and discussed handout in great detail. All questions were discussed and addressed.       PATIENT EDUCATION:  Education details: Discussed eval findings, rehab rationale and POC and patient is in agreement  Person educated: Patient and Parent Education method: Explanation Education comprehension: verbalized understanding and needs further education  HOME EXERCISE PROGRAM: Access Code: [email or web address] URL: [email or web address] Date: 07/02/2023 Prepared by: Gustavus Bryant  Exercises - Supine Knee Extension Strengthening  - 2 x daily - 5 x weekly - 1-2 sets - 15 reps - Supine Bridge  - 2  x daily - 5 x weekly - 1-2 sets - 15 reps - Seated Long Arc Quad  - 2 x daily - 5 x weekly - 1-2 sets - 15 reps - Heel Toe Raises with Counter Support  - 2 x daily - 5 x weekly - 1-2 sets - 15 reps - Seated Table Hamstring Stretch  - 2 x daily - 5 x weekly - 1 sets - 2 reps - 30s hold  ASSESSMENT:  CLINICAL IMPRESSION: Continued to add resistance and difficulty to exercises, challenging patient.  Advanced to gait with cane as per MD approval.  Patient able to ambulate 229ft with good cadence and step though pattern advancing cane appropriately.  Patient is a 63 y.o. male who was seen today for physical therapy evaluation and treatment for BLE weakness following traumatic fractures requiring ORIF. Patient presents with limited knee extension B, tight hamstrings and B LE weakness as evidenced by  30s chair stand test.  Further testing of balance and endurance warranted at upcoming sessions.  OBJECTIVE IMPAIRMENTS: Abnormal gait, decreased activity tolerance, decreased coordination, decreased endurance, decreased knowledge of condition, decreased mobility, difficulty walking, decreased strength, increased fascial restrictions, improper body mechanics, and pain.   ACTIVITY LIMITATIONS: carrying, lifting, standing, squatting, and stairs  PERSONAL FACTORS: Behavior pattern, Fitness, Past/current experiences, Time since onset of injury/illness/exacerbation, and Transportation are also affecting patient's functional outcome.   REHAB POTENTIAL: Fair based on needs   CLINICAL DECISION MAKING: Evolving/moderate complexity  EVALUATION COMPLEXITY: Moderate   GOALS: Goals reviewed with patient? No  SHORT TERM GOALS: Target date: 07/23/2023   Patient to demonstrate independence in HEP  Baseline: [id number] Goal status: INITIAL  2.  Assess 2 MWT Baseline: TBD Goal status: INITIAL  3.  Patient to negotiate 8 steps with most appropriate pattern Baseline: TBD Goal status: INITIAL   LONG TERM GOALS: Target date: 08/13/2023   Patient will acknowledge 4/10 worst pain at least once during episode of care   Baseline: 7/10 worst pain Goal status: INITIAL  2.  Patient will score at least 21/30 on PSFS to signify clinically meaningful improvement in functional abilities.   Baseline: 13/30 43% perceived functional ability Goal status: INITIAL  3.  Patient will increase 30s chair stand reps from 8 to 12 with arms to demonstrate and improved functional ability with less pain/difficulty as well as reduce fall risk.  Baseline: 8 Goal status: INITIAL  4.  Increase BLE strength to 4/5 Baseline:  MMT Right eval Left eval  Hip flexion    Hip extension 4- 4-  Hip abduction    Hip adduction    Hip internal rotation    Hip external rotation    Knee flexion    Knee extension 4- 4-  Ankle  dorsiflexion    Ankle plantarflexion 4- 4-   Goal status: INITIAL  5.  Retest 2 MWT for progress made Baseline: TBD Goal status: INITIAL  6.  270ft ambulation with LRAD Baseline: 67ft with RW Goal status: INITIAL   PLAN:  PT FREQUENCY: 2x/week  PT DURATION: 6 weeks  PLANNED INTERVENTIONS: [id number]- PT Re-evaluation, 97110-Therapeutic exercises, 97530- Therapeutic activity, 97112- Neuromuscular re-education, 97535- Self Care, 47829- Manual therapy, [phone]- Gait training, Patient/Family education, Balance training, and Stair training  PLAN FOR NEXT SESSION: HEP review and update, manual techniques as appropriate, aerobic tasks, ROM and flexibility activities, strengthening and PREs, TPDN, gait and balance training as needed   For all possible CPT codes, reference the Planned Interventions line above.  Check all conditions that are expected to impact treatment: {Conditions expected to impact treatment:Contractures, spasticity or fracture relevant to requested treatment and Social determinants of health   If treatment provided at initial evaluation, no treatment charged due to lack of authorization.       Hildred Laser, PT 07/25/2023, 2:06 PM

## 2023-07-26 ENCOUNTER — Ambulatory Visit

## 2023-07-26 DIAGNOSIS — M79604 Pain in right leg: Secondary | ICD-10-CM | POA: Diagnosis not present

## 2023-07-26 DIAGNOSIS — S8292XS Unspecified fracture of left lower leg, sequela: Secondary | ICD-10-CM

## 2023-07-26 DIAGNOSIS — S8291XS Unspecified fracture of right lower leg, sequela: Secondary | ICD-10-CM

## 2023-07-26 DIAGNOSIS — G8929 Other chronic pain: Secondary | ICD-10-CM

## 2023-07-30 NOTE — Therapy (Unsigned)
 OUTPATIENT PHYSICAL THERAPY TREATMENT NOTE   Patient Name: [name] MRN: [id number] DOB:Feb 01, 1961, 63 y.o., male Today's Date: 07/31/2023  END OF SESSION:  PT End of Session - 07/31/23 1328     Visit Number 7    Number of Visits 12    Date for PT Re-Evaluation 09/01/23    Authorization Type MCD    PT Start Time 1325    PT Stop Time 1405    PT Time Calculation (min) 40 min    Activity Tolerance Patient tolerated treatment well    Behavior During Therapy [facility] for tasks assessed/performed            Past Medical History:  Diagnosis Date   Alcohol use disorder 10/29/2022   Anxiety and depression    Bipolar 1 disorder (HCC)    Cocaine use disorder, mild, abuse (HCC) 12/31/2015   Tibia fracture 01/04/2023   Type I or II open fracture of left tibia and fibula 01/08/2023   Wernicke encephalopathy 10/29/2022   Past Surgical History:  Procedure Laterality Date   EYE SURGERY     I & D EXTREMITY Right 01/05/2023   Procedure: IRRIGATION AND DEBRIDEMENT RIGHT FOREARM;  Surgeon: Roby Lofts, MD;  Location: [facility] OR;  Service: Orthopedics;  Laterality: Right;   ORIF TIBIA FRACTURE Right 01/05/2023   Procedure: OPEN REDUCTION INTERNAL FIXATION (ORIF) TIBIA FRACTURE;  Surgeon: Roby Lofts, MD;  Location: [facility] OR;  Service: Orthopedics;  Laterality: Right;   TIBIA IM NAIL INSERTION Left 01/05/2023   Procedure: INTRAMEDULLARY (IM) NAIL TIBIAL;  Surgeon: Roby Lofts, MD;  Location: [facility] OR;  Service: Orthopedics;  Laterality: Left;   Patient Active Problem List   Diagnosis Date Noted   Bipolar disorder, in partial remission, most recent episode depressed (HCC) 01/08/2023   Type I or II open fracture of left tibia and fibula 01/08/2023   Tibia fracture 01/04/2023   Bipolar 1 disorder (HCC) 10/29/2022   Wernicke encephalopathy 10/29/2022   Alcohol use disorder 10/29/2022   Anxiety and depression    Bipolar affective disorder in remission (HCC) 01/16/2016   Cocaine use  disorder, mild, abuse (HCC) 12/31/2015    PCP: Rema Fendt, NP   REFERRING PROVIDER: Rema Fendt, NP  REFERRING DIAG: M79.604,M79.605,G89.29 (ICD-10-CM) - Chronic pain of both lower extremities S82.92XS (ICD-10-CM) - Leg fracture, left, sequela S82.91XS (ICD-10-CM) - Leg fracture, right, sequela  THERAPY DIAG:  Chronic pain of both lower extremities  Leg fracture, left, sequela  Leg fracture, right, sequela  Rationale for Evaluation and Treatment: Rehabilitation  ONSET DATE: chronic  SUBJECTIVE:   SUBJECTIVE STATEMENT: Cites a mild increase in LLE discomfort.  Denies injury/trauma or limitation in activity.  Has obtained tips for cane  to afford better traction.  PERTINENT HISTORY: 2. Chronic pain of both lower extremities 3. Leg fracture, left, sequela 4. Leg fracture, right, sequela - Referral to Orthopedic Surgery and Rehabilitation for evaluation/management.  - Follow-up with primary provider as scheduled.  - Ambulatory referral to Orthopedic Surgery - AMB referral to rehabilitation   PAIN:  Are you having pain? Yes: NPRS scale: 0-7/10 Pain location: L lower leg Pain description: ache  Aggravating factors: weight bearing, prolonged walking Relieving factors: rest and position changes  PRECAUTIONS: None  RED FLAGS: None   WEIGHT BEARING RESTRICTIONS: No  FALLS:  Has patient fallen in last 6 months? No  LIVING ENVIRONMENT: Lives with: lives with their family Lives in: House/apartment Stairs:  yes Has following equipment at home: Dan Humphreys -  2 wheeled  OCCUPATION: not working  PLOF: Independent with basic ADLs  PATIENT GOALS: To get around better  NEXT MD VISIT: TBD  OBJECTIVE:  Note: Objective measures were completed at Evaluation unless otherwise noted.  DIAGNOSTIC FINDINGS: IMPRESSION: 1. 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 Antibiotic  laden methylmethacrylate along the mid anterior tibial diaphysis within the tibial cortical defect. Small areas of bone bridging, but a majority of the fracture clefts persist without significant callus formation consistent with nonunion. 2. Oblique ununited fracture of the proximal fibular metadiaphysis without significant callus formation.     Electronically Signed   By: Onnie Bilis M.D.   On: 06/09/2023 09:03  PATIENT SURVEYS:  Patient-specific activity scoring scheme (Point to one number):  "0" represents "unable to perform." "10" represents "able to perform at prior level. 0 1 2 3 4 5 6 7 8 9  10 (Date and Score) Activity Initial  Activity Eval     Stair climbing  5    Arising from a chair  5    Prolonged walking  3    Additional Additional Total score = sum of the activity scores/number of activities Minimum detectable change (90%CI) for average score = 2 points Minimum detectable change (90%CI) for single activity score = 3 points PSFS developed by: [place] Spitz., & Binkley, J. (1995). Assessing disability and change on individual  patients: a report of a patient specific measure. Physiotherapy [place], 47, [phone]. Reproduced with the permission of the authors  Score: 13/30 43% perceived functional ability  COGNITION: Overall cognitive status: Within functional limits for tasks assessed    MUSCLE LENGTH: Hamstrings: Right 60 deg; Left 60 deg   POSTURE: No Significant postural limitations  PALPATION: deferred  LOWER EXTREMITY ROM:  A/PROM Right eval Left eval  Hip flexion    Hip extension    Hip abduction    Hip adduction    Hip internal rotation    Hip external rotation    Knee flexion    Knee extension -10/-2d -5/0d  Ankle dorsiflexion    Ankle plantarflexion    Ankle inversion    Ankle eversion     (Blank rows = not tested)  LOWER EXTREMITY MMT:  MMT Right eval Left eval  Hip flexion    Hip extension 4- 4-  Hip  abduction    Hip adduction    Hip internal rotation    Hip external rotation    Knee flexion    Knee extension 4- 4-  Ankle dorsiflexion    Ankle plantarflexion 4- 4-  Ankle inversion    Ankle eversion     (Blank rows = not tested)  LOWER EXTREMITY SPECIAL TESTS:  deferred  FUNCTIONAL TESTS:  30 seconds chair stand test 8 with UE assist needed  GAIT: Distance walked: 72ftx2 Assistive device utilized: Walker - 2 wheeled Level of assistance: Modified independence Comments: slow cadence  TREATMENT DATE:  [facility] Adult PT Treatment:                                                DATE: 07/31/22 Therapeutic Exercise: Nustep L4 8 min Seated hamstring stretch 30s x2 B Neuromuscular re-ed: Bridge with ball squeeze 15x Bridge against BluTB 15x Standing heel raises 10x 4 in block FAQs with ball squeeze 15x2 Therapeutic Activity: Supine hip fallouts BluTB 15x B, 15/15 unilaterally S/L clams BluTB 15/15x 10x STS from airex with elevated surface OH reach Seated hamstring curls GTB 15/15  [facility] Adult PT Treatment:                                                DATE: 07/26/23 Therapeutic Exercise: Nustep L4 8 min Seated hamstring stretch 30s x2 B Neuromuscular re-ed: Bridge with ball squeeze 15x Bridge against BluTB 10x Standing heel raises 10x 4 in block FAQs with ball squeeze 15x2 Therapeutic Activity: Supine hip fallouts BluTB 10x B, 10/10 unilaterally S/L clams BluTB 10/10x 10x STS from airex with elevated surface OH reach Seated hamstring curls GTB 15/15  [facility] Adult PT Treatment:                                                DATE: 07/24/23 Therapeutic Exercise: Nustep L4 8 min Seated hamstring stretch 30s x2 B Neuromuscular re-ed: Bridge with ball squeeze 15x Bridge against GTB 15x Standing heel raises 10x 4in block FAQs with ball squeeze  15x2 Gait with SPC 232ft Therapeutic Activity: Supine hip fallouts GTB 15x B, 15/15 unilaterally S/L clams GTB 15/15 10x STS from airex with elevated surface  [facility] Adult PT Treatment:                                                DATE: 07/19/23 Therapeutic Exercise: Nustep L4 8 min Seated hamstring stretch 30s x2 B Neuromuscular re-ed: Bridge with ball squeeze 15x Bridge against GTB 10x Standing heel raises 15x FAQs with ball squeeze 15x2 Therapeutic Activity: SLR 15x B 3# SAQs 15x2 B 3# Supine hip fallouts GTB 10x B, 10/10 unilaterally S/L clams GTB 10/10  [facility] Adult PT Treatment:                                                DATE: 07/16/23 Therapeutic Exercise: Nustep L 3 8 min Seated hamstring stretch 30s x2 B Neuromuscular re-ed: Bridge with ball squeeze 15x Bridge against RTB 15x Standing heel raises 15x FAQs with ball squeeze 15x Therapeutic Activity: SLR 15x B 2# SAQs 15x B 2# Supine hip fallouts RTB 15x B, 15/15 unilaterally S/L clams RTB 15/15  [facility] Adult PT Treatment:  DATE: 07/10/23 Therapeutic Exercise: Nustep L2 8 min Seated hamstring stretch 30s x2 B Neuromuscular re-ed: Bridge with ball squeeze 15x Bridge against RTB 15x Standing heel raises 15x Therapeutic Activity: SLR 15x B SAQs 15x B Supine hip fallouts RTB 15x B  [facility] Adult PT Treatment:                                                DATE: 07/02/23 Eval and HEP  Self Care: Additional minutes spent for educating on updated Therapeutic Home Exercise Program as well as comparing current status to condition at start of symptoms. This included exercises focusing on stretching, strengthening, with focus on eccentric aspects. Long term goals include an improvement in range of motion, strength, endurance as well as avoiding reinjury. Patient's frequency would include in 1-2 times a day, 3-5 times a week for a duration of 6-12 weeks. Proper technique shown and  discussed handout in great detail. All questions were discussed and addressed.       PATIENT EDUCATION:  Education details: Discussed eval findings, rehab rationale and POC and patient is in agreement  Person educated: Patient and Parent Education method: Explanation Education comprehension: verbalized understanding and needs further education  HOME EXERCISE PROGRAM: Access Code: [email or web address] URL: [email or web address] Date: 07/02/2023 Prepared by: Gretta Leavens  Exercises - Supine Knee Extension Strengthening  - 2 x daily - 5 x weekly - 1-2 sets - 15 reps - Supine Bridge  - 2 x daily - 5 x weekly - 1-2 sets - 15 reps - Seated Long Arc Quad  - 2 x daily - 5 x weekly - 1-2 sets - 15 reps - Heel Toe Raises with Counter Support  - 2 x daily - 5 x weekly - 1-2 sets - 15 reps - Seated Table Hamstring Stretch  - 2 x daily - 5 x weekly - 1 sets - 2 reps - 30s hold  ASSESSMENT:  CLINICAL IMPRESSION: Increased reps to improve endurance.  Assessed new cane tips which provide increased traction.  Added resistance to Nustep as noted.   Patient is a 64 y.o. male who was seen today for physical therapy evaluation and treatment for BLE weakness following traumatic fractures requiring ORIF. Patient presents with limited knee extension B, tight hamstrings and B LE weakness as evidenced by 30s chair stand test.  Further testing of balance and endurance warranted at upcoming sessions.  OBJECTIVE IMPAIRMENTS: Abnormal gait, decreased activity tolerance, decreased coordination, decreased endurance, decreased knowledge of condition, decreased mobility, difficulty walking, decreased strength, increased fascial restrictions, improper body mechanics, and pain.   ACTIVITY LIMITATIONS: carrying, lifting, standing, squatting, and stairs  PERSONAL FACTORS: Behavior pattern, Fitness, Past/current experiences, Time since onset of injury/illness/exacerbation, and Transportation are also affecting  patient's functional outcome.   REHAB POTENTIAL: Fair based on needs   CLINICAL DECISION MAKING: Evolving/moderate complexity  EVALUATION COMPLEXITY: Moderate   GOALS: Goals reviewed with patient? No  SHORT TERM GOALS: Target date: 07/23/2023   Patient to demonstrate independence in HEP  Baseline: [id number] Goal status: MET  2.  Assess 2 MWT Baseline: TBD Goal status: INITIAL  3.  Patient to negotiate 8 steps with most appropriate pattern Baseline: TBD Goal status: INITIAL   LONG TERM GOALS: Target date: 08/13/2023   Patient will acknowledge 4/10 worst pain at least once during episode of care   Baseline: 7/10 worst pain  Goal status: INITIAL  2.  Patient will score at least 21/30 on PSFS to signify clinically meaningful improvement in functional abilities.   Baseline: 13/30 43% perceived functional ability Goal status: INITIAL  3.  Patient will increase 30s chair stand reps from 8 to 12 with arms to demonstrate and improved functional ability with less pain/difficulty as well as reduce fall risk.  Baseline: 8 Goal status: INITIAL  4.  Increase BLE strength to 4/5 Baseline:  MMT Right eval Left eval  Hip flexion    Hip extension 4- 4-  Hip abduction    Hip adduction    Hip internal rotation    Hip external rotation    Knee flexion    Knee extension 4- 4-  Ankle dorsiflexion    Ankle plantarflexion 4- 4-   Goal status: INITIAL  5.  Retest 2 MWT for progress made Baseline: TBD Goal status: INITIAL  6.  272ft ambulation with LRAD Baseline: 5ft with RW Goal status: INITIAL   PLAN:  PT FREQUENCY: 2x/week  PT DURATION: 6 weeks  PLANNED INTERVENTIONS: 97164- PT Re-evaluation, 97110-Therapeutic exercises, 97530- Therapeutic activity, 97112- Neuromuscular re-education, 97535- Self Care, 91478- Manual therapy, [phone]- Gait training, Patient/Family education, Balance training, and Stair training  PLAN FOR NEXT SESSION: HEP review and update, manual  techniques as appropriate, aerobic tasks, ROM and flexibility activities, strengthening and PREs, TPDN, gait and balance training as needed   For all possible CPT codes, reference the Planned Interventions line above.     Check all conditions that are expected to impact treatment: {Conditions expected to impact treatment:Contractures, spasticity or fracture relevant to requested treatment and Social determinants of health   If treatment provided at initial evaluation, no treatment charged due to lack of authorization.       Deborahann Poteat M Ronnald Shedden, PT 07/31/2023, 2:13 PM

## 2023-07-31 ENCOUNTER — Ambulatory Visit

## 2023-07-31 DIAGNOSIS — S8291XS Unspecified fracture of right lower leg, sequela: Secondary | ICD-10-CM

## 2023-07-31 DIAGNOSIS — G8929 Other chronic pain: Secondary | ICD-10-CM

## 2023-07-31 DIAGNOSIS — M79604 Pain in right leg: Secondary | ICD-10-CM | POA: Diagnosis not present

## 2023-07-31 DIAGNOSIS — S8292XS Unspecified fracture of left lower leg, sequela: Secondary | ICD-10-CM

## 2023-07-31 NOTE — Therapy (Unsigned)
 OUTPATIENT PHYSICAL THERAPY TREATMENT NOTE   Patient Name: [name] MRN: [id number] DOB:05-Jul-1960, 63 y.o., male Today's Date: 08/02/2023  END OF SESSION:  PT End of Session - 08/02/23 1316     Visit Number 8    Number of Visits 12    Date for PT Re-Evaluation 09/01/23    Authorization Type MCD    PT Start Time 1315    PT Stop Time 1355    PT Time Calculation (min) 40 min    Activity Tolerance Patient tolerated treatment well    Behavior During Therapy [facility] for tasks assessed/performed             Past Medical History:  Diagnosis Date   Alcohol use disorder 10/29/2022   Anxiety and depression    Bipolar 1 disorder (HCC)    Cocaine use disorder, mild, abuse (HCC) 12/31/2015   Tibia fracture 01/04/2023   Type I or II open fracture of left tibia and fibula 01/08/2023   Wernicke encephalopathy 10/29/2022   Past Surgical History:  Procedure Laterality Date   EYE SURGERY     I & D EXTREMITY Right 01/05/2023   Procedure: IRRIGATION AND DEBRIDEMENT RIGHT FOREARM;  Surgeon: Roby Lofts, MD;  Location: [facility] OR;  Service: Orthopedics;  Laterality: Right;   ORIF TIBIA FRACTURE Right 01/05/2023   Procedure: OPEN REDUCTION INTERNAL FIXATION (ORIF) TIBIA FRACTURE;  Surgeon: Roby Lofts, MD;  Location: [facility] OR;  Service: Orthopedics;  Laterality: Right;   TIBIA IM NAIL INSERTION Left 01/05/2023   Procedure: INTRAMEDULLARY (IM) NAIL TIBIAL;  Surgeon: Roby Lofts, MD;  Location: [facility] OR;  Service: Orthopedics;  Laterality: Left;   Patient Active Problem List   Diagnosis Date Noted   Bipolar disorder, in partial remission, most recent episode depressed (HCC) 01/08/2023   Type I or II open fracture of left tibia and fibula 01/08/2023   Tibia fracture 01/04/2023   Bipolar 1 disorder (HCC) 10/29/2022   Wernicke encephalopathy 10/29/2022   Alcohol use disorder 10/29/2022   Anxiety and depression    Bipolar affective disorder in remission (HCC) 01/16/2016   Cocaine use  disorder, mild, abuse (HCC) 12/31/2015    PCP: Rema Fendt, NP   REFERRING PROVIDER: Rema Fendt, NP  REFERRING DIAG: M79.604,M79.605,G89.29 (ICD-10-CM) - Chronic pain of both lower extremities S82.92XS (ICD-10-CM) - Leg fracture, left, sequela S82.91XS (ICD-10-CM) - Leg fracture, right, sequela  THERAPY DIAG:  Leg fracture, left, sequela  Chronic pain of both lower extremities  Leg fracture, right, sequela  Rationale for Evaluation and Treatment: Rehabilitation  ONSET DATE: chronic  SUBJECTIVE:   SUBJECTIVE STATEMENT: Cites a mild increase in LLE discomfort.  Denies injury/trauma or limitation in activity.  Has obtained tips for cane  to afford better traction.  PERTINENT HISTORY: 2. Chronic pain of both lower extremities 3. Leg fracture, left, sequela 4. Leg fracture, right, sequela - Referral to Orthopedic Surgery and Rehabilitation for evaluation/management.  - Follow-up with primary provider as scheduled.  - Ambulatory referral to Orthopedic Surgery - AMB referral to rehabilitation   PAIN:  Are you having pain? Yes: NPRS scale: 0-7/10 Pain location: L lower leg Pain description: ache  Aggravating factors: weight bearing, prolonged walking Relieving factors: rest and position changes  PRECAUTIONS: None  RED FLAGS: None   WEIGHT BEARING RESTRICTIONS: No  FALLS:  Has patient fallen in last 6 months? No  LIVING ENVIRONMENT: Lives with: lives with their family Lives in: House/apartment Stairs:  yes Has following equipment at home:  Walker - 2 wheeled  OCCUPATION: not working  PLOF: Independent with basic ADLs  PATIENT GOALS: To get around better  NEXT MD VISIT: TBD  OBJECTIVE:  Note: Objective measures were completed at Evaluation unless otherwise noted.  DIAGNOSTIC FINDINGS: IMPRESSION: 1. 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 Antibiotic  laden methylmethacrylate along the mid anterior tibial diaphysis within the tibial cortical defect. Small areas of bone bridging, but a majority of the fracture clefts persist without significant callus formation consistent with nonunion. 2. Oblique ununited fracture of the proximal fibular metadiaphysis without significant callus formation.     Electronically Signed   By: Onnie Bilis M.D.   On: 06/09/2023 09:03  PATIENT SURVEYS:  Patient-specific activity scoring scheme (Point to one number):  "0" represents "unable to perform." "10" represents "able to perform at prior level. 0 1 2 3 4 5 6 7 8 9  10 (Date and Score) Activity Initial  Activity Eval     Stair climbing  5    Arising from a chair  5    Prolonged walking  3    Additional Additional Total score = sum of the activity scores/number of activities Minimum detectable change (90%CI) for average score = 2 points Minimum detectable change (90%CI) for single activity score = 3 points PSFS developed by: [place] Spitz., & Binkley, J. (1995). Assessing disability and change on individual  patients: a report of a patient specific measure. Physiotherapy [place], [phone], [phone]. Reproduced with the permission of the authors  Score: 13/30 43% perceived functional ability  COGNITION: Overall cognitive status: Within functional limits for tasks assessed    MUSCLE LENGTH: Hamstrings: Right 60 deg; Left 60 deg   POSTURE: No Significant postural limitations  PALPATION: deferred  LOWER EXTREMITY ROM:  A/PROM Right eval Left eval  Hip flexion    Hip extension    Hip abduction    Hip adduction    Hip internal rotation    Hip external rotation    Knee flexion    Knee extension -10/-2d -5/0d  Ankle dorsiflexion    Ankle plantarflexion    Ankle inversion    Ankle eversion     (Blank rows = not tested)  LOWER EXTREMITY MMT:  MMT Right eval Left eval  Hip flexion    Hip extension 4- 4-  Hip  abduction    Hip adduction    Hip internal rotation    Hip external rotation    Knee flexion    Knee extension 4- 4-  Ankle dorsiflexion    Ankle plantarflexion 4- 4-  Ankle inversion    Ankle eversion     (Blank rows = not tested)  LOWER EXTREMITY SPECIAL TESTS:  deferred  FUNCTIONAL TESTS:  30 seconds chair stand test 8 with UE assist needed  GAIT: Distance walked: 62ftx2 Assistive device utilized: Walker - 2 wheeled Level of assistance: Modified independence Comments: slow cadence  TREATMENT DATE:  [facility] Adult PT Treatment:                                                DATE: 08/02/23 Therapeutic Exercise: Nustep L4 8 min Seated hamstring stretch 30s x2 B Neuromuscular re-ed: 10x STS from airex holding p-ball 2 MWT 265 ft with cane Therapeutic Activity: Supine hip fallouts BluTB 15x B, 15/15 unilaterally S/L clams BluTB 15/15x Seated hamstring curls GTB 15/15  [facility] Adult PT Treatment:                                                DATE: 07/31/22 Therapeutic Exercise: Nustep L4 8 min Seated hamstring stretch 30s x2 B Neuromuscular re-ed: Bridge with ball squeeze 15x Bridge against BluTB 15x Standing heel raises 10x 4 in block FAQs with ball squeeze 15x2 Therapeutic Activity: Supine hip fallouts BluTB 15x B, 15/15 unilaterally S/L clams BluTB 15/15x 10x STS from airex with elevated surface OH reach Seated hamstring curls GTB 15/15  [facility] Adult PT Treatment:                                                DATE: 07/26/23 Therapeutic Exercise: Nustep L4 8 min Seated hamstring stretch 30s x2 B Neuromuscular re-ed: Bridge with ball squeeze 15x Bridge against BluTB 10x Standing heel raises 10x 4 in block FAQs with ball squeeze 15x2 Therapeutic Activity: Supine hip fallouts BluTB 10x B, 10/10 unilaterally S/L clams BluTB 10/10x 10x STS  from airex with elevated surface OH reach Seated hamstring curls GTB 15/15     PATIENT EDUCATION:  Education details: Discussed eval findings, rehab rationale and POC and patient is in agreement  Person educated: Patient and Parent Education method: Explanation Education comprehension: verbalized understanding and needs further education  HOME EXERCISE PROGRAM: Access Code: [email or web address] URL: [email or web address] Date: 07/02/2023 Prepared by: Gretta Leavens  Exercises - Supine Knee Extension Strengthening  - 2 x daily - 5 x weekly - 1-2 sets - 15 reps - Supine Bridge  - 2 x daily - 5 x weekly - 1-2 sets - 15 reps - Seated Long Arc Quad  - 2 x daily - 5 x weekly - 1-2 sets - 15 reps - Heel Toe Raises with Counter Support  - 2 x daily - 5 x weekly - 1-2 sets - 15 reps - Seated Table Hamstring Stretch  - 2 x daily - 5 x weekly - 1 sets - 2 reps - 30s hold  ASSESSMENT:  CLINICAL IMPRESSION: Assessed baseline in 2 MWT and continued to progress strengthening tasks.  Added reps as needed.  Fatigue and soreness reported following 2 MWT.  Patient is a 63 y.o. male who was seen today for physical therapy evaluation and treatment for BLE weakness following traumatic fractures requiring ORIF. Patient presents with limited knee extension B, tight hamstrings and B LE weakness as evidenced by 30s chair stand test.  Further testing of balance and endurance warranted at upcoming sessions.  OBJECTIVE IMPAIRMENTS: Abnormal gait, decreased activity tolerance, decreased coordination, decreased endurance, decreased knowledge of condition, decreased mobility, difficulty walking, decreased  strength, increased fascial restrictions, improper body mechanics, and pain.   ACTIVITY LIMITATIONS: carrying, lifting, standing, squatting, and stairs  PERSONAL FACTORS: Behavior pattern, Fitness, Past/current experiences, Time since onset of injury/illness/exacerbation, and Transportation are also affecting  patient's functional outcome.   REHAB POTENTIAL: Fair based on needs   CLINICAL DECISION MAKING: Evolving/moderate complexity  EVALUATION COMPLEXITY: Moderate   GOALS: Goals reviewed with patient? No  SHORT TERM GOALS: Target date: 07/23/2023   Patient to demonstrate independence in HEP  Baseline: [id number] Goal status: MET  2.  Assess 2 MWT Baseline: TBD; 08/02/23 247ft w/cane  Goal status: Met  3.  Patient to negotiate 8 steps with most appropriate pattern Baseline: TBD Goal status: Ongoing   LONG TERM GOALS: Target date: 08/13/2023   Patient will acknowledge 4/10 worst pain at least once during episode of care   Baseline: 7/10 worst pain Goal status: INITIAL  2.  Patient will score at least 21/30 on PSFS to signify clinically meaningful improvement in functional abilities.   Baseline: 13/30 43% perceived functional ability Goal status: INITIAL  3.  Patient will increase 30s chair stand reps from 8 to 12 with arms to demonstrate and improved functional ability with less pain/difficulty as well as reduce fall risk.  Baseline: 8 Goal status: INITIAL  4.  Increase BLE strength to 4/5 Baseline:  MMT Right eval Left eval  Hip flexion    Hip extension 4- 4-  Hip abduction    Hip adduction    Hip internal rotation    Hip external rotation    Knee flexion    Knee extension 4- 4-  Ankle dorsiflexion    Ankle plantarflexion 4- 4-   Goal status: INITIAL  5.  Retest 2 MWT for progress made Baseline: TBD Goal status: INITIAL  6.  265ft ambulation with LRAD Baseline: 12ft with RW Goal status: INITIAL   PLAN:  PT FREQUENCY: 2x/week  PT DURATION: 6 weeks  PLANNED INTERVENTIONS: 97164- PT Re-evaluation, 97110-Therapeutic exercises, 97530- Therapeutic activity, 97112- Neuromuscular re-education, 97535- Self Care, 62130- Manual therapy, [phone]- Gait training, Patient/Family education, Balance training, and Stair training  PLAN FOR NEXT SESSION: HEP review and  update, manual techniques as appropriate, aerobic tasks, ROM and flexibility activities, strengthening and PREs, TPDN, gait and balance training as needed   For all possible CPT codes, reference the Planned Interventions line above.     Check all conditions that are expected to impact treatment: {Conditions expected to impact treatment:Contractures, spasticity or fracture relevant to requested treatment and Social determinants of health   If treatment provided at initial evaluation, no treatment charged due to lack of authorization.       Tanish Prien M Mathilde Mcwherter, PT 08/02/2023, 1:46 PM

## 2023-08-02 ENCOUNTER — Ambulatory Visit

## 2023-08-02 DIAGNOSIS — G8929 Other chronic pain: Secondary | ICD-10-CM

## 2023-08-02 DIAGNOSIS — M79604 Pain in right leg: Secondary | ICD-10-CM | POA: Diagnosis not present

## 2023-08-02 DIAGNOSIS — S8291XS Unspecified fracture of right lower leg, sequela: Secondary | ICD-10-CM

## 2023-08-02 DIAGNOSIS — S8292XS Unspecified fracture of left lower leg, sequela: Secondary | ICD-10-CM

## 2023-08-03 ENCOUNTER — Other Ambulatory Visit (HOSPITAL_COMMUNITY)

## 2023-08-07 NOTE — Therapy (Unsigned)
 OUTPATIENT PHYSICAL THERAPY TREATMENT NOTE   Patient Name: [name] MRN: [id number] DOB:03-May-1960, 63 y.o., male Today's Date: 08/09/2023  END OF SESSION:  PT End of Session - 08/09/23 1355     Visit Number 9    Number of Visits 12    Date for PT Re-Evaluation 09/01/23    Authorization Type MCD    Authorization - Number of Visits 27    PT Start Time 1400    PT Stop Time 1440    PT Time Calculation (min) 40 min    Activity Tolerance Patient tolerated treatment well    Behavior During Therapy WFL for tasks assessed/performed              Past Medical History:  Diagnosis Date   Alcohol use disorder 10/29/2022   Anxiety and depression    Bipolar 1 disorder (HCC)    Cocaine use disorder, mild, abuse (HCC) 12/31/2015   Tibia fracture 01/04/2023   Type I or II open fracture of left tibia and fibula 01/08/2023   Wernicke encephalopathy 10/29/2022   Past Surgical History:  Procedure Laterality Date   EYE SURGERY     I & D EXTREMITY Right 01/05/2023   Procedure: IRRIGATION AND DEBRIDEMENT RIGHT FOREARM;  Surgeon: Laneta Pintos, MD;  Location: [facility] OR;  Service: Orthopedics;  Laterality: Right;   ORIF TIBIA FRACTURE Right 01/05/2023   Procedure: OPEN REDUCTION INTERNAL FIXATION (ORIF) TIBIA FRACTURE;  Surgeon: Laneta Pintos, MD;  Location: [facility] OR;  Service: Orthopedics;  Laterality: Right;   TIBIA IM NAIL INSERTION Left 01/05/2023   Procedure: INTRAMEDULLARY (IM) NAIL TIBIAL;  Surgeon: Laneta Pintos, MD;  Location: [facility] OR;  Service: Orthopedics;  Laterality: Left;   Patient Active Problem List   Diagnosis Date Noted   Bipolar disorder, in partial remission, most recent episode depressed (HCC) 01/08/2023   Type I or II open fracture of left tibia and fibula 01/08/2023   Tibia fracture 01/04/2023   Bipolar 1 disorder (HCC) 10/29/2022   Wernicke encephalopathy 10/29/2022   Alcohol use disorder 10/29/2022   Anxiety and depression    Bipolar affective disorder in  remission (HCC) 01/16/2016   Cocaine use disorder, mild, abuse (HCC) 12/31/2015    PCP: Senaida Dama, NP   REFERRING PROVIDER: Senaida Dama, NP  REFERRING DIAG: M79.604,M79.605,G89.29 (ICD-10-CM) - Chronic pain of both lower extremities S82.92XS (ICD-10-CM) - Leg fracture, left, sequela S82.91XS (ICD-10-CM) - Leg fracture, right, sequela  THERAPY DIAG:  Leg fracture, left, sequela  Chronic pain of both lower extremities  Leg fracture, right, sequela  Rationale for Evaluation and Treatment: Rehabilitation  ONSET DATE: chronic  SUBJECTIVE:   SUBJECTIVE STATEMENT: Doing well.  Minimal discomfort in BLEs.  Was able to get to the gym and used mainly UE machines.  PERTINENT HISTORY: 2. Chronic pain of both lower extremities 3. Leg fracture, left, sequela 4. Leg fracture, right, sequela - Referral to Orthopedic Surgery and Rehabilitation for evaluation/management.  - Follow-up with primary provider as scheduled.  - Ambulatory referral to Orthopedic Surgery - AMB referral to rehabilitation   PAIN:  Are you having pain? Yes: NPRS scale: 0-7/10 Pain location: L lower leg Pain description: ache  Aggravating factors: weight bearing, prolonged walking Relieving factors: rest and position changes  PRECAUTIONS: None  RED FLAGS: None   WEIGHT BEARING RESTRICTIONS: No  FALLS:  Has patient fallen in last 6 months? No  LIVING ENVIRONMENT: Lives with: lives with their family Lives in: House/apartment Stairs:  yes  Has following equipment at home: Otho Blitz - 2 wheeled  OCCUPATION: not working  PLOF: Independent with basic ADLs  PATIENT GOALS: To get around better  NEXT MD VISIT: TBD  OBJECTIVE:  Note: Objective measures were completed at Evaluation unless otherwise noted.  DIAGNOSTIC FINDINGS: IMPRESSION: 1. 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 Antibiotic laden  methylmethacrylate along the mid anterior tibial diaphysis within the tibial cortical defect. Small areas of bone bridging, but a majority of the fracture clefts persist without significant callus formation consistent with nonunion. 2. Oblique ununited fracture of the proximal fibular metadiaphysis without significant callus formation.     Electronically Signed   By: Onnie Bilis M.D.   On: 06/09/2023 09:03  PATIENT SURVEYS:  Patient-specific activity scoring scheme (Point to one number):  "0" represents "unable to perform." "10" represents "able to perform at prior level. 0 1 2 3 4 5 6 7 8 9  10 (Date and Score) Activity Initial  Activity Eval     Stair climbing  5    Arising from a chair  5    Prolonged walking  3    Additional Additional Total score = sum of the activity scores/number of activities Minimum detectable change (90%CI) for average score = 2 points Minimum detectable change (90%CI) for single activity score = 3 points PSFS developed by: [place] Spitz., & Binkley, J. (1995). Assessing disability and change on individual  patients: a report of a patient specific measure. Physiotherapy [place], 47, [phone]. Reproduced with the permission of the authors  Score: 13/30 43% perceived functional ability  COGNITION: Overall cognitive status: Within functional limits for tasks assessed    MUSCLE LENGTH: Hamstrings: Right 60 deg; Left 60 deg   POSTURE: No Significant postural limitations  PALPATION: deferred  LOWER EXTREMITY ROM:  A/PROM Right eval Left eval  Hip flexion    Hip extension    Hip abduction    Hip adduction    Hip internal rotation    Hip external rotation    Knee flexion    Knee extension -10/-2d -5/0d  Ankle dorsiflexion    Ankle plantarflexion    Ankle inversion    Ankle eversion     (Blank rows = not tested)  LOWER EXTREMITY MMT:  MMT Right eval Left eval  Hip flexion    Hip extension 4- 4-  Hip  abduction    Hip adduction    Hip internal rotation    Hip external rotation    Knee flexion    Knee extension 4- 4-  Ankle dorsiflexion    Ankle plantarflexion 4- 4-  Ankle inversion    Ankle eversion     (Blank rows = not tested)  LOWER EXTREMITY SPECIAL TESTS:  deferred  FUNCTIONAL TESTS:  30 seconds chair stand test 8 with UE assist needed  GAIT: Distance walked: 86ftx2 Assistive device utilized: Walker - 2 wheeled Level of assistance: Modified independence Comments: slow cadence  TREATMENT DATE:  [facility] Adult PT Treatment:                                                DATE: 08/09/23 Therapeutic Exercise: Nustep L5 8 min Seated hamstring stretch 30s x2 B Neuromuscular re-ed: Bridge with ball squeeze 15x Bridge against BlaTB 10x FAQs with ball squeeze 15x2 Therapeutic Activity: Supine hip fallouts BlaTB 10x B, 10/10 unilaterally S/L clams BluTB 10/10x Seated hamstring curls BluTB 15/15  [facility] Adult PT Treatment:                                                DATE: 08/02/23 Therapeutic Exercise: Nustep L4 8 min Seated hamstring stretch 30s x2 B Neuromuscular re-ed: 10x STS from airex holding p-ball 2 MWT 265 ft with cane Therapeutic Activity: Supine hip fallouts BluTB 15x B, 15/15 unilaterally S/L clams BluTB 15/15x Seated hamstring curls GTB 15/15  [facility] Adult PT Treatment:                                                DATE: 07/31/22 Therapeutic Exercise: Nustep L4 8 min Seated hamstring stretch 30s x2 B Neuromuscular re-ed: Bridge with ball squeeze 15x Bridge against BluTB 15x Standing heel raises 10x 4 in block FAQs with ball squeeze 15x2 Therapeutic Activity: Supine hip fallouts BluTB 15x B, 15/15 unilaterally S/L clams BluTB 15/15x 10x STS from airex with elevated surface OH reach Seated hamstring curls GTB 15/15  [facility] Adult  PT Treatment:                                                DATE: 07/26/23 Therapeutic Exercise: Nustep L4 8 min Seated hamstring stretch 30s x2 B Neuromuscular re-ed: Bridge with ball squeeze 15x Bridge against BluTB 10x Standing heel raises 10x 4 in block FAQs with ball squeeze 15x2 Therapeutic Activity: Supine hip fallouts BluTB 10x B, 10/10 unilaterally S/L clams BluTB 10/10x 10x STS from airex with elevated surface OH reach Seated hamstring curls GTB 15/15     PATIENT EDUCATION:  Education details: Discussed eval findings, rehab rationale and POC and patient is in agreement  Person educated: Patient and Parent Education method: Explanation Education comprehension: verbalized understanding and needs further education  HOME EXERCISE PROGRAM: Access Code: [email or web address] URL: [email or web address] Date: 07/02/2023 Prepared by: Gretta Leavens  Exercises - Supine Knee Extension Strengthening  - 2 x daily - 5 x weekly - 1-2 sets - 15 reps - Supine Bridge  - 2 x daily - 5 x weekly - 1-2 sets - 15 reps - Seated Long Arc Quad  - 2 x daily - 5 x weekly - 1-2 sets - 15 reps - Heel Toe Raises with Counter Support  - 2 x daily - 5 x weekly - 1-2 sets - 15 reps - Seated Table Hamstring Stretch  - 2 x daily - 5 x weekly - 1 sets - 2 reps - 30s hold  ASSESSMENT:  CLINICAL IMPRESSION:  Continued to add resistance and difficulty to tasks as noted.  Performed STS tasks from regular seat height with patient able to complete 10 reps but verbal cuing required for proper form to reduce bracing against table.  Patient is a 63 y.o. male who was seen today for physical therapy evaluation and treatment for BLE weakness following traumatic fractures requiring ORIF. Patient presents with limited knee extension B, tight hamstrings and B LE weakness as evidenced by 30s chair stand test.  Further testing of balance and endurance warranted at upcoming sessions.  OBJECTIVE IMPAIRMENTS: Abnormal  gait, decreased activity tolerance, decreased coordination, decreased endurance, decreased knowledge of condition, decreased mobility, difficulty walking, decreased strength, increased fascial restrictions, improper body mechanics, and pain.   ACTIVITY LIMITATIONS: carrying, lifting, standing, squatting, and stairs  PERSONAL FACTORS: Behavior pattern, Fitness, Past/current experiences, Time since onset of injury/illness/exacerbation, and Transportation are also affecting patient's functional outcome.   REHAB POTENTIAL: Fair based on needs   CLINICAL DECISION MAKING: Evolving/moderate complexity  EVALUATION COMPLEXITY: Moderate   GOALS: Goals reviewed with patient? No  SHORT TERM GOALS: Target date: 07/23/2023   Patient to demonstrate independence in HEP  Baseline: [id number] Goal status: MET  2.  Assess 2 MWT Baseline: TBD; 08/02/23 259ft w/cane  Goal status: Met  3.  Patient to negotiate 8 steps with most appropriate pattern Baseline: TBD Goal status: Ongoing   LONG TERM GOALS: Target date: 08/13/2023   Patient will acknowledge 4/10 worst pain at least once during episode of care   Baseline: 7/10 worst pain Goal status: INITIAL  2.  Patient will score at least 21/30 on PSFS to signify clinically meaningful improvement in functional abilities.   Baseline: 13/30 43% perceived functional ability Goal status: INITIAL  3.  Patient will increase 30s chair stand reps from 8 to 12 with arms to demonstrate and improved functional ability with less pain/difficulty as well as reduce fall risk.  Baseline: 8 Goal status: INITIAL  4.  Increase BLE strength to 4/5 Baseline:  MMT Right eval Left eval  Hip flexion    Hip extension 4- 4-  Hip abduction    Hip adduction    Hip internal rotation    Hip external rotation    Knee flexion    Knee extension 4- 4-  Ankle dorsiflexion    Ankle plantarflexion 4- 4-   Goal status: INITIAL  5.  Retest 2 MWT for progress made Baseline:  TBD Goal status: INITIAL  6.  225ft ambulation with LRAD Baseline: 29ft with RW Goal status: INITIAL   PLAN:  PT FREQUENCY: 2x/week  PT DURATION: 6 weeks  PLANNED INTERVENTIONS: 97164- PT Re-evaluation, 97110-Therapeutic exercises, 97530- Therapeutic activity, 97112- Neuromuscular re-education, 97535- Self Care, 52841- Manual therapy, [phone]- Gait training, Patient/Family education, Balance training, and Stair training  PLAN FOR NEXT SESSION: HEP review and update, manual techniques as appropriate, aerobic tasks, ROM and flexibility activities, strengthening and PREs, TPDN, gait and balance training as needed   For all possible CPT codes, reference the Planned Interventions line above.     Check all conditions that are expected to impact treatment: {Conditions expected to impact treatment:Contractures, spasticity or fracture relevant to requested treatment and Social determinants of health   If treatment provided at initial evaluation, no treatment charged due to lack of authorization.       Gurneet Matarese M Affan Callow, PT 08/09/2023, 3:03 PM

## 2023-08-09 ENCOUNTER — Ambulatory Visit

## 2023-08-09 DIAGNOSIS — M79604 Pain in right leg: Secondary | ICD-10-CM | POA: Diagnosis not present

## 2023-08-09 DIAGNOSIS — G8929 Other chronic pain: Secondary | ICD-10-CM

## 2023-08-09 DIAGNOSIS — S8292XS Unspecified fracture of left lower leg, sequela: Secondary | ICD-10-CM

## 2023-08-09 DIAGNOSIS — S8291XS Unspecified fracture of right lower leg, sequela: Secondary | ICD-10-CM

## 2023-08-10 ENCOUNTER — Ambulatory Visit

## 2023-08-10 DIAGNOSIS — S8291XS Unspecified fracture of right lower leg, sequela: Secondary | ICD-10-CM

## 2023-08-10 DIAGNOSIS — S8292XS Unspecified fracture of left lower leg, sequela: Secondary | ICD-10-CM

## 2023-08-10 DIAGNOSIS — M79604 Pain in right leg: Secondary | ICD-10-CM | POA: Diagnosis not present

## 2023-08-10 DIAGNOSIS — G8929 Other chronic pain: Secondary | ICD-10-CM

## 2023-08-10 NOTE — Therapy (Signed)
 OUTPATIENT PHYSICAL THERAPY TREATMENT NOTE   Patient Name: [name] MRN: [id number] DOB:[birth year], 63 y.o., male Today's Date: 08/10/2023  END OF SESSION:  PT End of Session - 08/10/23 1217     Visit Number 10    Number of Visits 12    Date for PT Re-Evaluation 09/01/23    Authorization Type MCD    PT Start Time 1215    PT Stop Time 1255    PT Time Calculation (min) 40 min    Activity Tolerance Patient tolerated treatment well    Behavior During Therapy [facility] for tasks assessed/performed              Past Medical History:  Diagnosis Date   Alcohol use disorder 10/29/2022   Anxiety and depression    Bipolar 1 disorder (HCC)    Cocaine use disorder, mild, abuse (HCC) 12/31/2015   Tibia fracture 01/04/2023   Type I or II open fracture of left tibia and fibula 01/08/2023   Wernicke encephalopathy 10/29/2022   Past Surgical History:  Procedure Laterality Date   EYE SURGERY     I & D EXTREMITY Right 01/05/2023   Procedure: IRRIGATION AND DEBRIDEMENT RIGHT FOREARM;  Surgeon: Laneta Pintos, MD;  Location: [facility] OR;  Service: Orthopedics;  Laterality: Right;   ORIF TIBIA FRACTURE Right 01/05/2023   Procedure: OPEN REDUCTION INTERNAL FIXATION (ORIF) TIBIA FRACTURE;  Surgeon: Laneta Pintos, MD;  Location: [facility] OR;  Service: Orthopedics;  Laterality: Right;   TIBIA IM NAIL INSERTION Left 01/05/2023   Procedure: INTRAMEDULLARY (IM) NAIL TIBIAL;  Surgeon: Laneta Pintos, MD;  Location: [facility] OR;  Service: Orthopedics;  Laterality: Left;   Patient Active Problem List   Diagnosis Date Noted   Bipolar disorder, in partial remission, most recent episode depressed (HCC) 01/08/2023   Type I or II open fracture of left tibia and fibula 01/08/2023   Tibia fracture 01/04/2023   Bipolar 1 disorder (HCC) 10/29/2022   Wernicke encephalopathy 10/29/2022   Alcohol use disorder 10/29/2022   Anxiety and depression    Bipolar affective disorder in remission (HCC) 01/16/2016   Cocaine use  disorder, mild, abuse (HCC) 12/31/2015    PCP: Senaida Dama, NP   REFERRING PROVIDER: Senaida Dama, NP  REFERRING DIAG: M79.604,M79.605,G89.29 (ICD-10-CM) - Chronic pain of both lower extremities S82.92XS (ICD-10-CM) - Leg fracture, left, sequela S82.91XS (ICD-10-CM) - Leg fracture, right, sequela  THERAPY DIAG:  Leg fracture, left, sequela  Chronic pain of both lower extremities  Leg fracture, right, sequela  Rationale for Evaluation and Treatment: Rehabilitation  ONSET DATE: chronic  SUBJECTIVE:   SUBJECTIVE STATEMENT: Relates some L foreleg discomfort/soreness but no more than usual/baseline  PERTINENT HISTORY: 2. Chronic pain of both lower extremities 3. Leg fracture, left, sequela 4. Leg fracture, right, sequela - Referral to Orthopedic Surgery and Rehabilitation for evaluation/management.  - Follow-up with primary provider as scheduled.  - Ambulatory referral to Orthopedic Surgery - AMB referral to rehabilitation   PAIN:  Are you having pain? Yes: NPRS scale: 0-7/10 Pain location: L lower leg Pain description: ache  Aggravating factors: weight bearing, prolonged walking Relieving factors: rest and position changes  PRECAUTIONS: None  RED FLAGS: None   WEIGHT BEARING RESTRICTIONS: No  FALLS:  Has patient fallen in last 6 months? No  LIVING ENVIRONMENT: Lives with: lives with their family Lives in: House/apartment Stairs:  yes Has following equipment at home: Otho Blitz - 2 wheeled  OCCUPATION: not working  PLOF: Independent with basic ADLs  PATIENT GOALS: To get around better  NEXT MD VISIT: TBD  OBJECTIVE:  Note: Objective measures were completed at Evaluation unless otherwise noted.  DIAGNOSTIC FINDINGS: IMPRESSION: 1. 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 Antibiotic laden methylmethacrylate along the mid anterior tibial diaphysis within  the tibial cortical defect. Small areas of bone bridging, but a majority of the fracture clefts persist without significant callus formation consistent with nonunion. 2. Oblique ununited fracture of the proximal fibular metadiaphysis without significant callus formation.     Electronically Signed   By: Onnie Bilis M.D.   On: 06/09/2023 09:03  PATIENT SURVEYS:  Patient-specific activity scoring scheme (Point to one number):  "0" represents "unable to perform." "10" represents "able to perform at prior level. 0 1 2 3 4 5 6 7 8 9  10 (Date and Score) Activity Initial  Activity Eval     Stair climbing  5    Arising from a chair  5    Prolonged walking  3    Additional Additional Total score = sum of the activity scores/number of activities Minimum detectable change (90%CI) for average score = 2 points Minimum detectable change (90%CI) for single activity score = 3 points PSFS developed by: [place] Spitz., & Binkley, J. (1995). Assessing disability and change on individual  patients: a report of a patient specific measure. Physiotherapy [place], [phone], [phone]. Reproduced with the permission of the authors  Score: 13/30 43% perceived functional ability  COGNITION: Overall cognitive status: Within functional limits for tasks assessed    MUSCLE LENGTH: Hamstrings: Right 60 deg; Left 60 deg   POSTURE: No Significant postural limitations  PALPATION: deferred  LOWER EXTREMITY ROM:  A/PROM Right eval Left eval  Hip flexion    Hip extension    Hip abduction    Hip adduction    Hip internal rotation    Hip external rotation    Knee flexion    Knee extension -10/-2d -5/0d  Ankle dorsiflexion    Ankle plantarflexion    Ankle inversion    Ankle eversion     (Blank rows = not tested)  LOWER EXTREMITY MMT:  MMT Right eval Left eval  Hip flexion    Hip extension 4- 4-  Hip abduction    Hip adduction    Hip internal rotation    Hip external  rotation    Knee flexion    Knee extension 4- 4-  Ankle dorsiflexion    Ankle plantarflexion 4- 4-  Ankle inversion    Ankle eversion     (Blank rows = not tested)  LOWER EXTREMITY SPECIAL TESTS:  deferred  FUNCTIONAL TESTS:  30 seconds chair stand test 8 with UE assist needed  GAIT: Distance walked: 53ftx2 Assistive device utilized: Walker - 2 wheeled Level of assistance: Modified independence Comments: slow cadence  TREATMENT DATE:  [facility] Adult PT Treatment:                                                DATE: 08/10/23 Therapeutic Exercise: Nustep L6 8 min Seated hamstring stretch 30s x2 B Neuromuscular re-ed: Bridge with ball squeeze 15x Bridge against BlaTB 12x FAQs with ball squeeze 15x2 Therapeutic Activity: 16 steps with cane and single rail progressing to step through pattern Supine hip fallouts BlaTB 12x B, 12/12 unilaterally S/L clams BluTB 12/12x Seated hamstring curls BlaTB 12/12  [facility] Adult PT Treatment:                                                DATE: 08/09/23 Therapeutic Exercise: Nustep L5 8 min Seated hamstring stretch 30s x2 B Neuromuscular re-ed: Bridge with ball squeeze 15x Bridge against BlaTB 10x FAQs with ball squeeze 15x2 Therapeutic Activity: Supine hip fallouts BlaTB 10x B, 10/10 unilaterally S/L clams BluTB 10/10x Seated hamstring curls BluTB 15/15  [facility] Adult PT Treatment:                                                DATE: 08/02/23 Therapeutic Exercise: Nustep L4 8 min Seated hamstring stretch 30s x2 B Neuromuscular re-ed: 10x STS from airex holding p-ball 2 MWT 265 ft with cane Therapeutic Activity: Supine hip fallouts BluTB 15x B, 15/15 unilaterally S/L clams BluTB 15/15x Seated hamstring curls GTB 15/15  [facility] Adult PT Treatment:                                                DATE:  07/31/22 Therapeutic Exercise: Nustep L4 8 min Seated hamstring stretch 30s x2 B Neuromuscular re-ed: Bridge with ball squeeze 15x Bridge against BluTB 15x Standing heel raises 10x 4 in block FAQs with ball squeeze 15x2 Therapeutic Activity: Supine hip fallouts BluTB 15x B, 15/15 unilaterally S/L clams BluTB 15/15x 10x STS from airex with elevated surface OH reach Seated hamstring curls GTB 15/15  [facility] Adult PT Treatment:                                                DATE: 07/26/23 Therapeutic Exercise: Nustep L4 8 min Seated hamstring stretch 30s x2 B Neuromuscular re-ed: Bridge with ball squeeze 15x Bridge against BluTB 10x Standing heel raises 10x 4 in block FAQs with ball squeeze 15x2 Therapeutic Activity: Supine hip fallouts BluTB 10x B, 10/10 unilaterally S/L clams BluTB 10/10x 10x STS from airex with elevated surface OH reach Seated hamstring curls GTB 15/15     PATIENT EDUCATION:  Education details: Discussed eval findings, rehab rationale and POC and patient is in agreement  Person educated: Patient and Parent Education method: Explanation Education comprehension: verbalized understanding and needs further education  HOME EXERCISE PROGRAM: Access Code: [email or web address] URL: [email or web address] Date: 07/02/2023 Prepared  by: Gretta Leavens  Exercises - Supine Knee Extension Strengthening  - 2 x daily - 5 x weekly - 1-2 sets - 15 reps - Supine Bridge  - 2 x daily - 5 x weekly - 1-2 sets - 15 reps - Seated Long Arc Quad  - 2 x daily - 5 x weekly - 1-2 sets - 15 reps - Heel Toe Raises with Counter Support  - 2 x daily - 5 x weekly - 1-2 sets - 15 reps - Seated Table Hamstring Stretch  - 2 x daily - 5 x weekly - 1 sets - 2 reps - 30s hold  ASSESSMENT:  CLINICAL IMPRESSION: Assessed stair climbing ability starting with a step to pattern progressing to step through.  Appropriate sequencing of cane and use of single rail observed.  Continued with B LE and  hip strengthening tasks as well  Patient is a 63 y.o. male who was seen today for physical therapy evaluation and treatment for BLE weakness following traumatic fractures requiring ORIF. Patient presents with limited knee extension B, tight hamstrings and B LE weakness as evidenced by 30s chair stand test.  Further testing of balance and endurance warranted at upcoming sessions.  OBJECTIVE IMPAIRMENTS: Abnormal gait, decreased activity tolerance, decreased coordination, decreased endurance, decreased knowledge of condition, decreased mobility, difficulty walking, decreased strength, increased fascial restrictions, improper body mechanics, and pain.   ACTIVITY LIMITATIONS: carrying, lifting, standing, squatting, and stairs  PERSONAL FACTORS: Behavior pattern, Fitness, Past/current experiences, Time since onset of injury/illness/exacerbation, and Transportation are also affecting patient's functional outcome.   REHAB POTENTIAL: Fair based on needs   CLINICAL DECISION MAKING: Evolving/moderate complexity  EVALUATION COMPLEXITY: Moderate   GOALS: Goals reviewed with patient? No  SHORT TERM GOALS: Target date: 07/23/2023   Patient to demonstrate independence in HEP  Baseline: [email or web address] Goal status: MET  2.  Assess 2 MWT Baseline: TBD; 08/02/23 229ft w/cane  Goal status: Met  3.  Patient to negotiate 8 steps with most appropriate pattern Baseline: TBD; 08/10/23 See note Goal status: Met   LONG TERM GOALS: Target date: 08/13/2023   Patient will acknowledge 4/10 worst pain at least once during episode of care   Baseline: 7/10 worst pain Goal status: INITIAL  2.  Patient will score at least 21/30 on PSFS to signify clinically meaningful improvement in functional abilities.   Baseline: 13/30 43% perceived functional ability Goal status: INITIAL  3.  Patient will increase 30s chair stand reps from 8 to 12 with arms to demonstrate and improved functional ability with less pain/difficulty  as well as reduce fall risk.  Baseline: 8 Goal status: INITIAL  4.  Increase BLE strength to 4/5 Baseline:  MMT Right eval Left eval  Hip flexion    Hip extension 4- 4-  Hip abduction    Hip adduction    Hip internal rotation    Hip external rotation    Knee flexion    Knee extension 4- 4-  Ankle dorsiflexion    Ankle plantarflexion 4- 4-   Goal status: INITIAL  5.  Retest 2 MWT for progress made Baseline: TBD Goal status: INITIAL  6.  253ft ambulation with LRAD Baseline: 83ft with RW Goal status: INITIAL   PLAN:  PT FREQUENCY: 2x/week  PT DURATION: 6 weeks  PLANNED INTERVENTIONS: 97164- PT Re-evaluation, 97110-Therapeutic exercises, 97530- Therapeutic activity, [id number]- Neuromuscular re-education, 97535- Self Care, 11914- Manual therapy, [phone]- Gait training, Patient/Family education, Balance training, and Stair training  PLAN FOR NEXT SESSION: HEP review and  update, manual techniques as appropriate, aerobic tasks, ROM and flexibility activities, strengthening and PREs, TPDN, gait and balance training as needed   For all possible CPT codes, reference the Planned Interventions line above.     Check all conditions that are expected to impact treatment: {Conditions expected to impact treatment:Contractures, spasticity or fracture relevant to requested treatment and Social determinants of health   If treatment provided at initial evaluation, no treatment charged due to lack of authorization.       Aharon Carriere M Ugochi Henzler, PT 08/10/2023, 12:57 PM

## 2023-08-13 ENCOUNTER — Ambulatory Visit

## 2023-08-15 NOTE — Therapy (Signed)
 OUTPATIENT PHYSICAL THERAPY TREATMENT NOTE   Patient Name: MAKONNEN LEIBENSPERGER MRN: [id number] DOB:1960/10/30, 63 y.o., male Today's Date: 08/16/2023  END OF SESSION:  PT End of Session - 08/16/23 1402     Visit Number 11    Number of Visits 12    Date for PT Re-Evaluation 09/01/23    Authorization Type MCD    Authorization - Number of Visits 27    PT Start Time 1400    PT Stop Time 1440    PT Time Calculation (min) 40 min    Activity Tolerance Patient tolerated treatment well    Behavior During Therapy WFL for tasks assessed/performed               Past Medical History:  Diagnosis Date   Alcohol use disorder 10/29/2022   Anxiety and depression    Bipolar 1 disorder (HCC)    Cocaine use disorder, mild, abuse (HCC) 12/31/2015   Tibia fracture 01/04/2023   Type I or II open fracture of left tibia and fibula 01/08/2023   Wernicke encephalopathy 10/29/2022   Past Surgical History:  Procedure Laterality Date   EYE SURGERY     I & D EXTREMITY Right 01/05/2023   Procedure: IRRIGATION AND DEBRIDEMENT RIGHT FOREARM;  Surgeon: Laneta Pintos, MD;  Location: [facility] OR;  Service: Orthopedics;  Laterality: Right;   ORIF TIBIA FRACTURE Right 01/05/2023   Procedure: OPEN REDUCTION INTERNAL FIXATION (ORIF) TIBIA FRACTURE;  Surgeon: Laneta Pintos, MD;  Location: [facility] OR;  Service: Orthopedics;  Laterality: Right;   TIBIA IM NAIL INSERTION Left 01/05/2023   Procedure: INTRAMEDULLARY (IM) NAIL TIBIAL;  Surgeon: Laneta Pintos, MD;  Location: [facility] OR;  Service: Orthopedics;  Laterality: Left;   Patient Active Problem List   Diagnosis Date Noted   Bipolar disorder, in partial remission, most recent episode depressed (HCC) 01/08/2023   Type I or II open fracture of left tibia and fibula 01/08/2023   Tibia fracture 01/04/2023   Bipolar 1 disorder (HCC) 10/29/2022   Wernicke encephalopathy 10/29/2022   Alcohol use disorder 10/29/2022   Anxiety and depression    Bipolar affective disorder in  remission (HCC) 01/16/2016   Cocaine use disorder, mild, abuse (HCC) 12/31/2015    PCP: Senaida Dama, NP   REFERRING PROVIDER: Senaida Dama, NP  REFERRING DIAG: M79.604,M79.605,G89.29 (ICD-10-CM) - Chronic pain of both lower extremities S82.92XS (ICD-10-CM) - Leg fracture, left, sequela S82.91XS (ICD-10-CM) - Leg fracture, right, sequela  THERAPY DIAG:  Leg fracture, left, sequela  Chronic pain of both lower extremities  Leg fracture, right, sequela  Rationale for Evaluation and Treatment: Rehabilitation  ONSET DATE: chronic  SUBJECTIVE:   SUBJECTIVE STATEMENT: Saw MD, fracture healing very well.  Has been to the gym and plans to get a membership.  PERTINENT HISTORY: 2. Chronic pain of both lower extremities 3. Leg fracture, left, sequela 4. Leg fracture, right, sequela - Referral to Orthopedic Surgery and Rehabilitation for evaluation/management.  - Follow-up with primary provider as scheduled.  - Ambulatory referral to Orthopedic Surgery - AMB referral to rehabilitation   PAIN:  Are you having pain? Yes: NPRS scale: 0-7/10 Pain location: L lower leg Pain description: ache  Aggravating factors: weight bearing, prolonged walking Relieving factors: rest and position changes  PRECAUTIONS: None  RED FLAGS: None   WEIGHT BEARING RESTRICTIONS: No  FALLS:  Has patient fallen in last 6 months? No  LIVING ENVIRONMENT: Lives with: lives with their family Lives in: House/apartment Stairs:  yes Has  following equipment at home: Otho Blitz - 2 wheeled  OCCUPATION: not working  PLOF: Independent with basic ADLs  PATIENT GOALS: To get around better  NEXT MD VISIT: TBD  OBJECTIVE:  Note: Objective measures were completed at Evaluation unless otherwise noted.  DIAGNOSTIC FINDINGS: IMPRESSION: 1. 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 Antibiotic laden  methylmethacrylate along the mid anterior tibial diaphysis within the tibial cortical defect. Small areas of bone bridging, but a majority of the fracture clefts persist without significant callus formation consistent with nonunion. 2. Oblique ununited fracture of the proximal fibular metadiaphysis without significant callus formation.     Electronically Signed   By: Onnie Bilis M.D.   On: 06/09/2023 09:03  PATIENT SURVEYS:  Patient-specific activity scoring scheme (Point to one number):  "0" represents "unable to perform." "10" represents "able to perform at prior level. 0 1 2 3 4 5 6 7 8 9  10 (Date and Score) Activity Initial  Activity Eval     Stair climbing  5    Arising from a chair  5    Prolonged walking  3    Additional Additional Total score = sum of the activity scores/number of activities Minimum detectable change (90%CI) for average score = 2 points Minimum detectable change (90%CI) for single activity score = 3 points PSFS developed by: [place] Spitz., & Binkley, J. (1995). Assessing disability and change on individual  patients: a report of a patient specific measure. Physiotherapy [place], [phone], [phone]. Reproduced with the permission of the authors  Score: 13/30 43% perceived functional ability  COGNITION: Overall cognitive status: Within functional limits for tasks assessed    MUSCLE LENGTH: Hamstrings: Right 60 deg; Left 60 deg   POSTURE: No Significant postural limitations  PALPATION: deferred  LOWER EXTREMITY ROM:  A/PROM Right eval Left eval  Hip flexion    Hip extension    Hip abduction    Hip adduction    Hip internal rotation    Hip external rotation    Knee flexion    Knee extension -10/-2d -5/0d  Ankle dorsiflexion    Ankle plantarflexion    Ankle inversion    Ankle eversion     (Blank rows = not tested)  LOWER EXTREMITY MMT:  MMT Right eval Left eval  Hip flexion    Hip extension 4- 4-  Hip  abduction    Hip adduction    Hip internal rotation    Hip external rotation    Knee flexion    Knee extension 4- 4-  Ankle dorsiflexion    Ankle plantarflexion 4- 4-  Ankle inversion    Ankle eversion     (Blank rows = not tested)  LOWER EXTREMITY SPECIAL TESTS:  deferred  FUNCTIONAL TESTS:  30 seconds chair stand test 8 with UE assist needed  GAIT: Distance walked: 65ftx2 Assistive device utilized: Walker - 2 wheeled Level of assistance: Modified independence Comments: slow cadence  TREATMENT DATE:  [facility] Adult PT Treatment:                                                DATE: 08/16/23 Therapeutic Exercise: Nustep L7 8 min Seated hamstring stretch 30s x2 B Neuromuscular re-ed: Supine hip fallouts BlaTB 15x B, 15/15 unilaterally S/L clams BluTB 15/15x Bridge against BlaTB 15x Bridge with ball squeeze 15x Therapeutic Activity: Seated hamstring curls BlaTB 15/15 Runners step 4 in 10/10 5# KB STS from airex pad 5# KB 10x  [facility] Adult PT Treatment:                                                DATE: 08/10/23 Therapeutic Exercise: Nustep L6 8 min Seated hamstring stretch 30s x2 B Neuromuscular re-ed: Bridge with ball squeeze 15x Bridge against BlaTB 12x FAQs with ball squeeze 15x2 Therapeutic Activity: 16 steps with cane and single rail progressing to step through pattern Supine hip fallouts BlaTB 12x B, 12/12 unilaterally S/L clams BluTB 12/12x Seated hamstring curls BlaTB 12/12  [facility] Adult PT Treatment:                                                DATE: 08/09/23 Therapeutic Exercise: Nustep L5 8 min Seated hamstring stretch 30s x2 B Neuromuscular re-ed: Bridge with ball squeeze 15x Bridge against BlaTB 10x FAQs with ball squeeze 15x2 Therapeutic Activity: Supine hip fallouts BlaTB 10x B, 10/10 unilaterally S/L clams BluTB  10/10x Seated hamstring curls BluTB 15/15  [facility] Adult PT Treatment:                                                DATE: 08/02/23 Therapeutic Exercise: Nustep L4 8 min Seated hamstring stretch 30s x2 B Neuromuscular re-ed: 10x STS from airex holding p-ball 2 MWT 265 ft with cane Therapeutic Activity: Supine hip fallouts BluTB 15x B, 15/15 unilaterally S/L clams BluTB 15/15x Seated hamstring curls GTB 15/15  [facility] Adult PT Treatment:                                                DATE: 07/31/22 Therapeutic Exercise: Nustep L4 8 min Seated hamstring stretch 30s x2 B Neuromuscular re-ed: Bridge with ball squeeze 15x Bridge against BluTB 15x Standing heel raises 10x 4 in block FAQs with ball squeeze 15x2 Therapeutic Activity: Supine hip fallouts BluTB 15x B, 15/15 unilaterally S/L clams BluTB 15/15x 10x STS from airex with elevated surface OH reach Seated hamstring curls GTB 15/15  [facility] Adult PT Treatment:                                                DATE: 07/26/23 Therapeutic Exercise: Avanell Leigh  L4 8 min Seated hamstring stretch 30s x2 B Neuromuscular re-ed: Bridge with ball squeeze 15x Bridge against BluTB 10x Standing heel raises 10x 4 in block FAQs with ball squeeze 15x2 Therapeutic Activity: Supine hip fallouts BluTB 10x B, 10/10 unilaterally S/L clams BluTB 10/10x 10x STS from airex with elevated surface OH reach Seated hamstring curls GTB 15/15     PATIENT EDUCATION:  Education details: Discussed eval findings, rehab rationale and POC and patient is in agreement  Person educated: Patient and Parent Education method: Explanation Education comprehension: verbalized understanding and needs further education  HOME EXERCISE PROGRAM: Access Code: [email or web address] URL: [email or web address] Date: 07/02/2023 Prepared by: Gretta Leavens  Exercises - Supine Knee Extension Strengthening  - 2 x daily - 5 x weekly - 1-2 sets - 15 reps - Supine Bridge  - 2 x daily  - 5 x weekly - 1-2 sets - 15 reps - Seated Long Arc Quad  - 2 x daily - 5 x weekly - 1-2 sets - 15 reps - Heel Toe Raises with Counter Support  - 2 x daily - 5 x weekly - 1-2 sets - 15 reps - Seated Table Hamstring Stretch  - 2 x daily - 5 x weekly - 1 sets - 2 reps - 30s hold  ASSESSMENT:  CLINICAL IMPRESSION: Increased reps as noted and added resistance to aerobic work.  Incorporated additional balance and proprioceptive work via [employer] and runners step.  Ambulate short distances in clinic w/o need of cane.  Recommend continue 1w4 to address remaining deficits.  Patient is a 63 y.o. male who was seen today for physical therapy evaluation and treatment for BLE weakness following traumatic fractures requiring ORIF. Patient presents with limited knee extension B, tight hamstrings and B LE weakness as evidenced by 30s chair stand test.  Further testing of balance and endurance warranted at upcoming sessions.  OBJECTIVE IMPAIRMENTS: Abnormal gait, decreased activity tolerance, decreased coordination, decreased endurance, decreased knowledge of condition, decreased mobility, difficulty walking, decreased strength, increased fascial restrictions, improper body mechanics, and pain.   ACTIVITY LIMITATIONS: carrying, lifting, standing, squatting, and stairs  PERSONAL FACTORS: Behavior pattern, Fitness, Past/current experiences, Time since onset of injury/illness/exacerbation, and Transportation are also affecting patient's functional outcome.   REHAB POTENTIAL: Fair based on needs   CLINICAL DECISION MAKING: Evolving/moderate complexity  EVALUATION COMPLEXITY: Moderate   GOALS: Goals reviewed with patient? No  SHORT TERM GOALS: Target date: 07/23/2023   Patient to demonstrate independence in HEP  Baseline: [id number] Goal status: MET  2.  Assess 2 MWT Baseline: TBD; 08/02/23 241ft w/cane  Goal status: Met  3.  Patient to negotiate 8 steps with most appropriate pattern Baseline: TBD;  08/10/23 See note Goal status: Met   LONG TERM GOALS: Target date: 08/13/2023   Patient will acknowledge 4/10 worst pain at least once during episode of care   Baseline: 7/10 worst pain; 08/16/23 4/10 Goal status: Met  2.  Patient will score at least 21/30 on PSFS to signify clinically meaningful improvement in functional abilities.   Baseline: 13/30 43% perceived functional ability Goal status: Ongoing  3.  Patient will increase 30s chair stand reps from 8 to 12 with arms to demonstrate and improved functional ability with less pain/difficulty as well as reduce fall risk.  Baseline: 8 Goal status: Ongoing  4.  Increase BLE strength to 4/5 Baseline:  MMT Right eval Left eval  Hip flexion    Hip extension 4- 4-  Hip abduction    Hip adduction  Hip internal rotation    Hip external rotation    Knee flexion    Knee extension 4- 4-  Ankle dorsiflexion    Ankle plantarflexion 4- 4-   Goal status: INITIAL  5.  Retest 2 MWT for progress made Baseline: TBD Goal status: Ongoing  6.  242ft ambulation with LRAD Baseline: 38ft with RW; 239ft + with cane outside of clinic Goal status: Ongoing   PLAN:  PT FREQUENCY: 1x/week  PT DURATION: 4 weeks  PLANNED INTERVENTIONS: 97164- PT Re-evaluation, 97110-Therapeutic exercises, 97530- Therapeutic activity, 97112- Neuromuscular re-education, 97535- Self Care, 16109- Manual therapy, [phone]- Gait training, Patient/Family education, Balance training, and Stair training  PLAN FOR NEXT SESSION: HEP review and update, manual techniques as appropriate, aerobic tasks, ROM and flexibility activities, strengthening and PREs, TPDN, gait and balance training as needed   For all possible CPT codes, reference the Planned Interventions line above.     Check all conditions that are expected to impact treatment: {Conditions expected to impact treatment:Contractures, spasticity or fracture relevant to requested treatment and Social determinants of  health   If treatment provided at initial evaluation, no treatment charged due to lack of authorization.       Waylon Hershey M Bertrum Helmstetter, PT 08/16/2023, 2:46 PM

## 2023-08-16 ENCOUNTER — Ambulatory Visit: Attending: Family

## 2023-08-16 DIAGNOSIS — M79604 Pain in right leg: Secondary | ICD-10-CM | POA: Diagnosis present

## 2023-08-16 DIAGNOSIS — S8291XA Unspecified fracture of right lower leg, initial encounter for closed fracture: Secondary | ICD-10-CM | POA: Diagnosis not present

## 2023-08-16 DIAGNOSIS — G8929 Other chronic pain: Secondary | ICD-10-CM | POA: Diagnosis present

## 2023-08-16 DIAGNOSIS — S8292XS Unspecified fracture of left lower leg, sequela: Secondary | ICD-10-CM | POA: Diagnosis present

## 2023-08-16 DIAGNOSIS — M79605 Pain in left leg: Secondary | ICD-10-CM | POA: Insufficient documentation

## 2023-08-16 DIAGNOSIS — S8292XA Unspecified fracture of left lower leg, initial encounter for closed fracture: Secondary | ICD-10-CM | POA: Insufficient documentation

## 2023-08-16 DIAGNOSIS — S8291XS Unspecified fracture of right lower leg, sequela: Secondary | ICD-10-CM | POA: Diagnosis present

## 2023-08-21 NOTE — Therapy (Unsigned)
 OUTPATIENT PHYSICAL THERAPY TREATMENT NOTE   Patient Name: [name] MRN: [id number] DOB:[birth year], 63 y.o., male Today's Date: 08/23/2023  END OF SESSION:  PT End of Session - 08/23/23 1748     Visit Number 12    Number of Visits 16    Date for PT Re-Evaluation 09/01/23    Authorization Type MCD    Authorization - Number of Visits 27    PT Start Time 1745    PT Stop Time 1825    PT Time Calculation (min) 40 min    Activity Tolerance Patient tolerated treatment well    Behavior During Therapy WFL for tasks assessed/performed                Past Medical History:  Diagnosis Date   Alcohol use disorder 10/29/2022   Anxiety and depression    Bipolar 1 disorder (HCC)    Cocaine use disorder, mild, abuse (HCC) 12/31/2015   Tibia fracture 01/04/2023   Type I or II open fracture of left tibia and fibula 01/08/2023   Wernicke encephalopathy 10/29/2022   Past Surgical History:  Procedure Laterality Date   EYE SURGERY     I & D EXTREMITY Right 01/05/2023   Procedure: IRRIGATION AND DEBRIDEMENT RIGHT FOREARM;  Surgeon: Laneta Pintos, MD;  Location: [facility] OR;  Service: Orthopedics;  Laterality: Right;   ORIF TIBIA FRACTURE Right 01/05/2023   Procedure: OPEN REDUCTION INTERNAL FIXATION (ORIF) TIBIA FRACTURE;  Surgeon: Laneta Pintos, MD;  Location: [facility] OR;  Service: Orthopedics;  Laterality: Right;   TIBIA IM NAIL INSERTION Left 01/05/2023   Procedure: INTRAMEDULLARY (IM) NAIL TIBIAL;  Surgeon: Laneta Pintos, MD;  Location: [facility] OR;  Service: Orthopedics;  Laterality: Left;   Patient Active Problem List   Diagnosis Date Noted   Bipolar disorder, in partial remission, most recent episode depressed (HCC) 01/08/2023   Type I or II open fracture of left tibia and fibula 01/08/2023   Tibia fracture 01/04/2023   Bipolar 1 disorder (HCC) 10/29/2022   Wernicke encephalopathy 10/29/2022   Alcohol use disorder 10/29/2022   Anxiety and depression    Bipolar affective disorder in  remission (HCC) 01/16/2016   Cocaine use disorder, mild, abuse (HCC) 12/31/2015    PCP: Senaida Dama, NP   REFERRING PROVIDER: Senaida Dama, NP  REFERRING DIAG: M79.604,M79.605,G89.29 (ICD-10-CM) - Chronic pain of both lower extremities S82.92XS (ICD-10-CM) - Leg fracture, left, sequela S82.91XS (ICD-10-CM) - Leg fracture, right, sequela  THERAPY DIAG:  Leg fracture, left, sequela  Chronic pain of both lower extremities  Leg fracture, right, sequela  Rationale for Evaluation and Treatment: Rehabilitation  ONSET DATE: chronic  SUBJECTIVE:   SUBJECTIVE STATEMENT: Continues to report decreasing pain and increasing function.  PERTINENT HISTORY: 2. Chronic pain of both lower extremities 3. Leg fracture, left, sequela 4. Leg fracture, right, sequela - Referral to Orthopedic Surgery and Rehabilitation for evaluation/management.  - Follow-up with primary provider as scheduled.  - Ambulatory referral to Orthopedic Surgery - AMB referral to rehabilitation   PAIN:  Are you having pain? Yes: NPRS scale: 0-7/10 Pain location: L lower leg Pain description: ache  Aggravating factors: weight bearing, prolonged walking Relieving factors: rest and position changes  PRECAUTIONS: None  RED FLAGS: None   WEIGHT BEARING RESTRICTIONS: No  FALLS:  Has patient fallen in last 6 months? No  LIVING ENVIRONMENT: Lives with: lives with their family Lives in: House/apartment Stairs: yes Has following equipment at home: Otho Blitz - 2 wheeled  OCCUPATION:  not working  PLOF: Independent with basic ADLs  PATIENT GOALS: To get around better  NEXT MD VISIT: TBD  OBJECTIVE:  Note: Objective measures were completed at Evaluation unless otherwise noted.  DIAGNOSTIC FINDINGS: IMPRESSION: 1. 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 Antibiotic laden methylmethacrylate along the mid  anterior tibial diaphysis within the tibial cortical defect. Small areas of bone bridging, but a majority of the fracture clefts persist without significant callus formation consistent with nonunion. 2. Oblique ununited fracture of the proximal fibular metadiaphysis without significant callus formation.     Electronically Signed   By: Onnie Bilis M.D.   On: 06/09/2023 09:03  PATIENT SURVEYS:  Patient-specific activity scoring scheme (Point to one number):  "0" represents "unable to perform." "10" represents "able to perform at prior level. 0 1 2 3 4 5 6 7 8 9  10 (Date and Score) Activity Initial  Activity Eval     Stair climbing  5    Arising from a chair  5    Prolonged walking  3    Additional Additional Total score = sum of the activity scores/number of activities Minimum detectable change (90%CI) for average score = 2 points Minimum detectable change (90%CI) for single activity score = 3 points PSFS developed by: [place] Spitz., & Binkley, J. (1995). Assessing disability and change on individual  patients: a report of a patient specific measure. Physiotherapy [place], [phone], [phone]. Reproduced with the permission of the authors  Score: 13/30 43% perceived functional ability  COGNITION: Overall cognitive status: Within functional limits for tasks assessed    MUSCLE LENGTH: Hamstrings: Right 60 deg; Left 60 deg   POSTURE: No Significant postural limitations  PALPATION: deferred  LOWER EXTREMITY ROM:  A/PROM Right eval Left eval  Hip flexion    Hip extension    Hip abduction    Hip adduction    Hip internal rotation    Hip external rotation    Knee flexion    Knee extension -10/-2d -5/0d  Ankle dorsiflexion    Ankle plantarflexion    Ankle inversion    Ankle eversion     (Blank rows = not tested)  LOWER EXTREMITY MMT:  MMT Right eval Left eval  Hip flexion    Hip extension 4- 4-  Hip abduction    Hip adduction    Hip  internal rotation    Hip external rotation    Knee flexion    Knee extension 4- 4-  Ankle dorsiflexion    Ankle plantarflexion 4- 4-  Ankle inversion    Ankle eversion     (Blank rows = not tested)  LOWER EXTREMITY SPECIAL TESTS:  deferred  FUNCTIONAL TESTS:  30 seconds chair stand test 8 with UE assist needed  GAIT: Distance walked: 16ftx2 Assistive device utilized: Walker - 2 wheeled Level of assistance: Modified independence Comments: slow cadence  TREATMENT DATE:  [facility] Adult PT Treatment:                                                DATE: 08/23/23 Therapeutic Exercise: Nustep L8 8 min Seated hamstring stretch 30s x2 B Neuromuscular re-ed: Supine hip fallouts BlaTB 15x B, 15/15 unilaterally S/L clams BluTB 15/15x Bridge against BlaTB 15x Bridge with ball squeeze 15x Therapeutic Activity: Seated hamstring curls BlaTB 15/15 Runners step 4 in 10/10 10# KB STS from airex pad 10# KB 10x  [facility] Adult PT Treatment:                                                DATE: 08/16/23 Therapeutic Exercise: Nustep L7 8 min Seated hamstring stretch 30s x2 B Neuromuscular re-ed: Supine hip fallouts BlaTB 15x B, 15/15 unilaterally S/L clams BluTB 15/15x Bridge against BlaTB 15x Bridge with ball squeeze 15x Therapeutic Activity: Seated hamstring curls BlaTB 15/15 Runners step 4 in 10/10 5# KB STS from airex pad 5# KB 10x  [facility] Adult PT Treatment:                                                DATE: 08/10/23 Therapeutic Exercise: Nustep L6 8 min Seated hamstring stretch 30s x2 B Neuromuscular re-ed: Bridge with ball squeeze 15x Bridge against BlaTB 12x FAQs with ball squeeze 15x2 Therapeutic Activity: 16 steps with cane and single rail progressing to step through pattern Supine hip fallouts BlaTB 12x B, 12/12 unilaterally S/L clams BluTB 12/12x Seated  hamstring curls BlaTB 12/12      PATIENT EDUCATION:  Education details: Discussed eval findings, rehab rationale and POC and patient is in agreement  Person educated: Patient and Parent Education method: Explanation Education comprehension: verbalized understanding and needs further education  HOME EXERCISE PROGRAM: Access Code: [email or web address] URL: [email or web address] Date: 07/02/2023 Prepared by: Gretta Leavens  Exercises - Supine Knee Extension Strengthening  - 2 x daily - 5 x weekly - 1-2 sets - 15 reps - Supine Bridge  - 2 x daily - 5 x weekly - 1-2 sets - 15 reps - Seated Long Arc Quad  - 2 x daily - 5 x weekly - 1-2 sets - 15 reps - Heel Toe Raises with Counter Support  - 2 x daily - 5 x weekly - 1-2 sets - 15 reps - Seated Table Hamstring Stretch  - 2 x daily - 5 x weekly - 1 sets - 2 reps - 30s hold  ASSESSMENT:  CLINICAL IMPRESSION: Added additional resistance to aerobic work.  Reviewed t-band exercises one moe time and band issued to patient to continue with HEP as we advance to more functional tasks in clinic.  Patient is a 63 y.o. male who was seen today for physical therapy evaluation and treatment for BLE weakness following traumatic fractures requiring ORIF. Patient presents with limited knee extension B, tight hamstrings and B LE weakness as evidenced by 30s chair stand test.  Further testing of balance and endurance warranted at upcoming sessions.  OBJECTIVE IMPAIRMENTS: Abnormal gait, decreased activity tolerance, decreased coordination, decreased endurance, decreased  knowledge of condition, decreased mobility, difficulty walking, decreased strength, increased fascial restrictions, improper body mechanics, and pain.   ACTIVITY LIMITATIONS: carrying, lifting, standing, squatting, and stairs  PERSONAL FACTORS: Behavior pattern, Fitness, Past/current experiences, Time since onset of injury/illness/exacerbation, and Transportation are also affecting  patient's functional outcome.   REHAB POTENTIAL: Fair based on needs   CLINICAL DECISION MAKING: Evolving/moderate complexity  EVALUATION COMPLEXITY: Moderate   GOALS: Goals reviewed with patient? No  SHORT TERM GOALS: Target date: 07/23/2023   Patient to demonstrate independence in HEP  Baseline: [email or web address] Goal status: MET  2.  Assess 2 MWT Baseline: TBD; 08/02/23 248ft w/cane  Goal status: Met  3.  Patient to negotiate 8 steps with most appropriate pattern Baseline: TBD; 08/10/23 See note Goal status: Met   LONG TERM GOALS: Target date: 08/13/2023   Patient will acknowledge 4/10 worst pain at least once during episode of care   Baseline: 7/10 worst pain; 08/16/23 4/10 Goal status: Met  2.  Patient will score at least 21/30 on PSFS to signify clinically meaningful improvement in functional abilities.   Baseline: 13/30 43% perceived functional ability Goal status: Ongoing  3.  Patient will increase 30s chair stand reps from 8 to 12 with arms to demonstrate and improved functional ability with less pain/difficulty as well as reduce fall risk.  Baseline: 8 Goal status: Ongoing  4.  Increase BLE strength to 4/5 Baseline:  MMT Right eval Left eval  Hip flexion    Hip extension 4- 4-  Hip abduction    Hip adduction    Hip internal rotation    Hip external rotation    Knee flexion    Knee extension 4- 4-  Ankle dorsiflexion    Ankle plantarflexion 4- 4-   Goal status: INITIAL  5.  Retest 2 MWT for progress made Baseline: TBD Goal status: Ongoing  6.  228ft ambulation with LRAD Baseline: 54ft with RW; 224ft + with cane outside of clinic Goal status: Ongoing   PLAN:  PT FREQUENCY: 1x/week  PT DURATION: 4 weeks  PLANNED INTERVENTIONS: 97164- PT Re-evaluation, 97110-Therapeutic exercises, 97530- Therapeutic activity, 97112- Neuromuscular re-education, 97535- Self Care, 47829- Manual therapy, [phone]- Gait training, Patient/Family education, Balance training, and  Stair training  PLAN FOR NEXT SESSION: HEP review and update, manual techniques as appropriate, aerobic tasks, ROM and flexibility activities, strengthening and PREs, TPDN, gait and balance training as needed   For all possible CPT codes, reference the Planned Interventions line above.     Check all conditions that are expected to impact treatment: {Conditions expected to impact treatment:Contractures, spasticity or fracture relevant to requested treatment and Social determinants of health   If treatment provided at initial evaluation, no treatment charged due to lack of authorization.       Benzion Mesta M Daleyza Gadomski, PT 08/23/2023, 6:28 PM

## 2023-08-23 ENCOUNTER — Ambulatory Visit

## 2023-08-23 DIAGNOSIS — S8292XS Unspecified fracture of left lower leg, sequela: Secondary | ICD-10-CM

## 2023-08-23 DIAGNOSIS — S8292XA Unspecified fracture of left lower leg, initial encounter for closed fracture: Secondary | ICD-10-CM | POA: Diagnosis not present

## 2023-08-23 DIAGNOSIS — G8929 Other chronic pain: Secondary | ICD-10-CM

## 2023-08-23 DIAGNOSIS — S8291XS Unspecified fracture of right lower leg, sequela: Secondary | ICD-10-CM

## 2023-08-28 NOTE — Therapy (Unsigned)
 OUTPATIENT PHYSICAL THERAPY TREATMENT NOTE   Patient Name: [name] MRN: [id number] DOB:1961/03/07, 63 y.o., male Today's Date: 08/30/2023  END OF SESSION:  PT End of Session - 08/30/23 1317     Visit Number 13    Number of Visits 16    Date for PT Re-Evaluation 09/01/23    Authorization Type MCD    Authorization - Number of Visits 27    PT Start Time 1315    PT Stop Time 1355    PT Time Calculation (min) 40 min    Activity Tolerance Patient tolerated treatment well    Behavior During Therapy WFL for tasks assessed/performed            Past Medical History:  Diagnosis Date   Alcohol use disorder 10/29/2022   Anxiety and depression    Bipolar 1 disorder (HCC)    Cocaine use disorder, mild, abuse (HCC) 12/31/2015   Tibia fracture 01/04/2023   Type I or II open fracture of left tibia and fibula 01/08/2023   Wernicke encephalopathy 10/29/2022   Past Surgical History:  Procedure Laterality Date   EYE SURGERY     I & D EXTREMITY Right 01/05/2023   Procedure: IRRIGATION AND DEBRIDEMENT RIGHT FOREARM;  Surgeon: Laneta Pintos, MD;  Location: [facility] OR;  Service: Orthopedics;  Laterality: Right;   ORIF TIBIA FRACTURE Right 01/05/2023   Procedure: OPEN REDUCTION INTERNAL FIXATION (ORIF) TIBIA FRACTURE;  Surgeon: Laneta Pintos, MD;  Location: [facility] OR;  Service: Orthopedics;  Laterality: Right;   TIBIA IM NAIL INSERTION Left 01/05/2023   Procedure: INTRAMEDULLARY (IM) NAIL TIBIAL;  Surgeon: Laneta Pintos, MD;  Location: [facility] OR;  Service: Orthopedics;  Laterality: Left;   Patient Active Problem List   Diagnosis Date Noted   Bipolar disorder, in partial remission, most recent episode depressed (HCC) 01/08/2023   Type I or II open fracture of left tibia and fibula 01/08/2023   Tibia fracture 01/04/2023   Bipolar 1 disorder (HCC) 10/29/2022   Wernicke encephalopathy 10/29/2022   Alcohol use disorder 10/29/2022   Anxiety and depression    Bipolar affective disorder in  remission (HCC) 01/16/2016   Cocaine use disorder, mild, abuse (HCC) 12/31/2015    PCP: Senaida Dama, NP   REFERRING PROVIDER: Senaida Dama, NP  REFERRING DIAG: M79.604,M79.605,G89.29 (ICD-10-CM) - Chronic pain of both lower extremities S82.92XS (ICD-10-CM) - Leg fracture, left, sequela S82.91XS (ICD-10-CM) - Leg fracture, right, sequela  THERAPY DIAG:  Leg fracture, left, sequela  Chronic pain of both lower extremities  Leg fracture, right, sequela  Rationale for Evaluation and Treatment: Rehabilitation  ONSET DATE: chronic  SUBJECTIVE:   SUBJECTIVE STATEMENT: Has been compliant with attempts to wean from cane and is not using cane for household mobility  PERTINENT HISTORY: 2. Chronic pain of both lower extremities 3. Leg fracture, left, sequela 4. Leg fracture, right, sequela - Referral to Orthopedic Surgery and Rehabilitation for evaluation/management.  - Follow-up with primary provider as scheduled.  - Ambulatory referral to Orthopedic Surgery - AMB referral to rehabilitation   PAIN:  Are you having pain? Yes: NPRS scale: 0-7/10 Pain location: L lower leg Pain description: ache  Aggravating factors: weight bearing, prolonged walking Relieving factors: rest and position changes  PRECAUTIONS: None  RED FLAGS: None   WEIGHT BEARING RESTRICTIONS: No  FALLS:  Has patient fallen in last 6 months? No  LIVING ENVIRONMENT: Lives with: lives with their family Lives in: House/apartment Stairs: yes Has following equipment at home: Otho Blitz -  2 wheeled  OCCUPATION: not working  PLOF: Independent with basic ADLs  PATIENT GOALS: To get around better  NEXT MD VISIT: TBD  OBJECTIVE:  Note: Objective measures were completed at Evaluation unless otherwise noted.  DIAGNOSTIC FINDINGS: IMPRESSION: 1. 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 Antibiotic laden  methylmethacrylate along the mid anterior tibial diaphysis within the tibial cortical defect. Small areas of bone bridging, but a majority of the fracture clefts persist without significant callus formation consistent with nonunion. 2. Oblique ununited fracture of the proximal fibular metadiaphysis without significant callus formation.     Electronically Signed   By: Onnie Bilis M.D.   On: 06/09/2023 09:03  PATIENT SURVEYS:  Patient-specific activity scoring scheme (Point to one number):  "0" represents "unable to perform." "10" represents "able to perform at prior level. 0 1 2 3 4 5 6 7 8 9  10 (Date and Score) Activity Initial  Activity Eval     Stair climbing  5    Arising from a chair  5    Prolonged walking  3    Additional Additional Total score = sum of the activity scores/number of activities Minimum detectable change (90%CI) for average score = 2 points Minimum detectable change (90%CI) for single activity score = 3 points PSFS developed by: [place] Spitz., & Binkley, J. (1995). Assessing disability and change on individual  patients: a report of a patient specific measure. Physiotherapy [place], [phone], [phone]. Reproduced with the permission of the authors  Score: 13/30 43% perceived functional ability  COGNITION: Overall cognitive status: Within functional limits for tasks assessed    MUSCLE LENGTH: Hamstrings: Right 60 deg; Left 60 deg   POSTURE: No Significant postural limitations  PALPATION: deferred  LOWER EXTREMITY ROM:  A/PROM Right eval Left eval  Hip flexion    Hip extension    Hip abduction    Hip adduction    Hip internal rotation    Hip external rotation    Knee flexion    Knee extension -10/-2d -5/0d  Ankle dorsiflexion    Ankle plantarflexion    Ankle inversion    Ankle eversion     (Blank rows = not tested)  LOWER EXTREMITY MMT:  MMT Right eval Left eval  Hip flexion    Hip extension 4- 4-  Hip  abduction    Hip adduction    Hip internal rotation    Hip external rotation    Knee flexion    Knee extension 4- 4-  Ankle dorsiflexion    Ankle plantarflexion 4- 4-  Ankle inversion    Ankle eversion     (Blank rows = not tested)  LOWER EXTREMITY SPECIAL TESTS:  deferred  FUNCTIONAL TESTS:  30 seconds chair stand test 8 with UE assist needed  GAIT: Distance walked: 60ftx2 Assistive device utilized: Walker - 2 wheeled Level of assistance: Modified independence Comments: slow cadence  TREATMENT DATE:  [facility] Adult PT Treatment:                                                DATE: 08/30/23 Therapeutic Exercise: Nustep L8 8 min Seated hamstring stretch 30s x2 B Neuromuscular re-ed: Heel raise over 4 in step 15x2 Runners step 6 in 10/10 5# KB Therapeutic Activity: Omega knee extension 15# 15x2 Omega knee flexion 20# 15x2 Omega leg press 35# 15x2  [facility] Adult PT Treatment:                                                DATE: 08/23/23 Therapeutic Exercise: Nustep L8 8 min Seated hamstring stretch 30s x2 B Neuromuscular re-ed: Supine hip fallouts BlaTB 15x B, 15/15 unilaterally S/L clams BluTB 15/15x Bridge against BlaTB 15x Bridge with ball squeeze 15x Therapeutic Activity: Seated hamstring curls BlaTB 15/15 Runners step 4 in 10/10 10# KB STS from airex pad 10# KB 10x  [facility] Adult PT Treatment:                                                DATE: 08/16/23 Therapeutic Exercise: Nustep L7 8 min Seated hamstring stretch 30s x2 B Neuromuscular re-ed: Supine hip fallouts BlaTB 15x B, 15/15 unilaterally S/L clams BluTB 15/15x Bridge against BlaTB 15x Bridge with ball squeeze 15x Therapeutic Activity: Seated hamstring curls BlaTB 15/15 Runners step 4 in 10/10 5# KB STS from airex pad 5# KB 10x  [facility] Adult PT Treatment:                                                 DATE: 08/10/23 Therapeutic Exercise: Nustep L6 8 min Seated hamstring stretch 30s x2 B Neuromuscular re-ed: Bridge with ball squeeze 15x Bridge against BlaTB 12x FAQs with ball squeeze 15x2 Therapeutic Activity: 16 steps with cane and single rail progressing to step through pattern Supine hip fallouts BlaTB 12x B, 12/12 unilaterally S/L clams BluTB 12/12x Seated hamstring curls BlaTB 12/12      PATIENT EDUCATION:  Education details: Discussed eval findings, rehab rationale and POC and patient is in agreement  Person educated: Patient and Parent Education method: Explanation Education comprehension: verbalized understanding and needs further education  HOME EXERCISE PROGRAM: Access Code: [email or web address] URL: [email or web address] Date: 07/02/2023 Prepared by: Gretta Leavens  Exercises - Supine Knee Extension Strengthening  - 2 x daily - 5 x weekly - 1-2 sets - 15 reps - Supine Bridge  - 2 x daily - 5 x weekly - 1-2 sets - 15 reps - Seated Long Arc Quad  - 2 x daily - 5 x weekly - 1-2 sets - 15 reps - Heel Toe Raises with Counter Support  - 2 x daily - 5 x weekly - 1-2 sets - 15 reps - Seated Table Hamstring Stretch  - 2 x daily - 5 x weekly - 1 sets - 2 reps - 30s hold  ASSESSMENT:  CLINICAL IMPRESSION: Added  height and challenge to runners step and introduced isotonics strengthening tasks.  Able to complete all requested tasks w/o symptoms aggravation.  Still requires UE support with stepping tasks requiring balance challenges.  Patient is a 63 y.o. male who was seen today for physical therapy evaluation and treatment for BLE weakness following traumatic fractures requiring ORIF. Patient presents with limited knee extension B, tight hamstrings and B LE weakness as evidenced by 30s chair stand test.  Further testing of balance and endurance warranted at upcoming sessions.  OBJECTIVE IMPAIRMENTS: Abnormal gait, decreased activity tolerance, decreased  coordination, decreased endurance, decreased knowledge of condition, decreased mobility, difficulty walking, decreased strength, increased fascial restrictions, improper body mechanics, and pain.   ACTIVITY LIMITATIONS: carrying, lifting, standing, squatting, and stairs  PERSONAL FACTORS: Behavior pattern, Fitness, Past/current experiences, Time since onset of injury/illness/exacerbation, and Transportation are also affecting patient's functional outcome.   REHAB POTENTIAL: Fair based on needs   CLINICAL DECISION MAKING: Evolving/moderate complexity  EVALUATION COMPLEXITY: Moderate   GOALS: Goals reviewed with patient? No  SHORT TERM GOALS: Target date: 07/23/2023   Patient to demonstrate independence in HEP  Baseline: [email or web address] Goal status: MET  2.  Assess 2 MWT Baseline: TBD; 08/02/23 215ft w/cane  Goal status: Met  3.  Patient to negotiate 8 steps with most appropriate pattern Baseline: TBD; 08/10/23 See note Goal status: Met   LONG TERM GOALS: Target date: 08/13/2023   Patient will acknowledge 4/10 worst pain at least once during episode of care   Baseline: 7/10 worst pain; 08/16/23 4/10 Goal status: Met  2.  Patient will score at least 21/30 on PSFS to signify clinically meaningful improvement in functional abilities.   Baseline: 13/30 43% perceived functional ability Goal status: Ongoing  3.  Patient will increase 30s chair stand reps from 8 to 12 with arms to demonstrate and improved functional ability with less pain/difficulty as well as reduce fall risk.  Baseline: 8 Goal status: Ongoing  4.  Increase BLE strength to 4/5 Baseline:  MMT Right eval Left eval  Hip flexion    Hip extension 4- 4-  Hip abduction    Hip adduction    Hip internal rotation    Hip external rotation    Knee flexion    Knee extension 4- 4-  Ankle dorsiflexion    Ankle plantarflexion 4- 4-   Goal status: INITIAL  5.  Retest 2 MWT for progress made Baseline: TBD Goal status:  Ongoing  6.  283ft ambulation with LRAD Baseline: 39ft with RW; 220ft + with cane outside of clinic Goal status: Ongoing   PLAN:  PT FREQUENCY: 1x/week  PT DURATION: 4 weeks  PLANNED INTERVENTIONS: 97164- PT Re-evaluation, 97110-Therapeutic exercises, 97530- Therapeutic activity, 97112- Neuromuscular re-education, 97535- Self Care, 54098- Manual therapy, [phone]- Gait training, Patient/Family education, Balance training, and Stair training  PLAN FOR NEXT SESSION: HEP review and update, manual techniques as appropriate, aerobic tasks, ROM and flexibility activities, strengthening and PREs, TPDN, gait and balance training as needed   For all possible CPT codes, reference the Planned Interventions line above.     Check all conditions that are expected to impact treatment: {Conditions expected to impact treatment:Contractures, spasticity or fracture relevant to requested treatment and Social determinants of health   If treatment provided at initial evaluation, no treatment charged due to lack of authorization.       Avin Upperman M Ketzia Guzek, PT 08/30/2023, 1:49 PM

## 2023-08-30 ENCOUNTER — Ambulatory Visit

## 2023-08-30 DIAGNOSIS — S8292XS Unspecified fracture of left lower leg, sequela: Secondary | ICD-10-CM

## 2023-08-30 DIAGNOSIS — S8292XA Unspecified fracture of left lower leg, initial encounter for closed fracture: Secondary | ICD-10-CM | POA: Diagnosis not present

## 2023-08-30 DIAGNOSIS — S8291XS Unspecified fracture of right lower leg, sequela: Secondary | ICD-10-CM

## 2023-08-30 DIAGNOSIS — G8929 Other chronic pain: Secondary | ICD-10-CM

## 2023-09-03 NOTE — Therapy (Signed)
 OUTPATIENT PHYSICAL THERAPY TREATMENT NOTE   Patient Name: [name] MRN: [id number] DOB:1960/07/07, 63 y.o., male Today's Date: 09/05/2023  END OF SESSION:  PT End of Session - 09/05/23 1533     Visit Number 14    Number of Visits 16    Date for PT Re-Evaluation 09/01/23    Authorization Type MCD    Authorization - Number of Visits 27    PT Start Time 1530    PT Stop Time 1610    PT Time Calculation (min) 40 min    Activity Tolerance Patient tolerated treatment well    Behavior During Therapy WFL for tasks assessed/performed             Past Medical History:  Diagnosis Date   Alcohol use disorder 10/29/2022   Anxiety and depression    Bipolar 1 disorder (HCC)    Cocaine use disorder, mild, abuse (HCC) 12/31/2015   Tibia fracture 01/04/2023   Type I or II open fracture of left tibia and fibula 01/08/2023   Wernicke encephalopathy 10/29/2022   Past Surgical History:  Procedure Laterality Date   EYE SURGERY     I & D EXTREMITY Right 01/05/2023   Procedure: IRRIGATION AND DEBRIDEMENT RIGHT FOREARM;  Surgeon: Laneta Pintos, MD;  Location: [facility] OR;  Service: Orthopedics;  Laterality: Right;   ORIF TIBIA FRACTURE Right 01/05/2023   Procedure: OPEN REDUCTION INTERNAL FIXATION (ORIF) TIBIA FRACTURE;  Surgeon: Laneta Pintos, MD;  Location: [facility] OR;  Service: Orthopedics;  Laterality: Right;   TIBIA IM NAIL INSERTION Left 01/05/2023   Procedure: INTRAMEDULLARY (IM) NAIL TIBIAL;  Surgeon: Laneta Pintos, MD;  Location: [facility] OR;  Service: Orthopedics;  Laterality: Left;   Patient Active Problem List   Diagnosis Date Noted   Bipolar disorder, in partial remission, most recent episode depressed (HCC) 01/08/2023   Type I or II open fracture of left tibia and fibula 01/08/2023   Tibia fracture 01/04/2023   Bipolar 1 disorder (HCC) 10/29/2022   Wernicke encephalopathy 10/29/2022   Alcohol use disorder 10/29/2022   Anxiety and depression    Bipolar affective disorder in  remission (HCC) 01/16/2016   Cocaine use disorder, mild, abuse (HCC) 12/31/2015    PCP: Senaida Dama, NP   REFERRING PROVIDER: Senaida Dama, NP  REFERRING DIAG: M79.604,M79.605,G89.29 (ICD-10-CM) - Chronic pain of both lower extremities S82.92XS (ICD-10-CM) - Leg fracture, left, sequela S82.91XS (ICD-10-CM) - Leg fracture, right, sequela  THERAPY DIAG:  Leg fracture, left, sequela  Chronic pain of both lower extremities  Leg fracture, right, sequela  Rationale for Evaluation and Treatment: Rehabilitation  ONSET DATE: chronic  SUBJECTIVE:   SUBJECTIVE STATEMENT: no new issues to address.  Overall discomfort is minimal.  Has begun to attend gym regularly  PERTINENT HISTORY: 2. Chronic pain of both lower extremities 3. Leg fracture, left, sequela 4. Leg fracture, right, sequela - Referral to Orthopedic Surgery and Rehabilitation for evaluation/management.  - Follow-up with primary provider as scheduled.  - Ambulatory referral to Orthopedic Surgery - AMB referral to rehabilitation   PAIN:  Are you having pain? Yes: NPRS scale: 0-7/10 Pain location: L lower leg Pain description: ache  Aggravating factors: weight bearing, prolonged walking Relieving factors: rest and position changes  PRECAUTIONS: None  RED FLAGS: None   WEIGHT BEARING RESTRICTIONS: No  FALLS:  Has patient fallen in last 6 months? No  LIVING ENVIRONMENT: Lives with: lives with their family Lives in: House/apartment Stairs: yes Has following equipment at home:  Walker - 2 wheeled  OCCUPATION: not working  PLOF: Independent with basic ADLs  PATIENT GOALS: To get around better  NEXT MD VISIT: TBD  OBJECTIVE:  Note: Objective measures were completed at Evaluation unless otherwise noted.  DIAGNOSTIC FINDINGS: IMPRESSION: 1. 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 Antibiotic laden  methylmethacrylate along the mid anterior tibial diaphysis within the tibial cortical defect. Small areas of bone bridging, but a majority of the fracture clefts persist without significant callus formation consistent with nonunion. 2. Oblique ununited fracture of the proximal fibular metadiaphysis without significant callus formation.     Electronically Signed   By: Onnie Bilis M.D.   On: 06/09/2023 09:03  PATIENT SURVEYS:  Patient-specific activity scoring scheme (Point to one number):  "0" represents "unable to perform." "10" represents "able to perform at prior level. 0 1 2 3 4 5 6 7 8 9  10 (Date and Score) Activity Initial  Activity Eval     Stair climbing  5    Arising from a chair  5    Prolonged walking  3    Additional Additional Total score = sum of the activity scores/number of activities Minimum detectable change (90%CI) for average score = 2 points Minimum detectable change (90%CI) for single activity score = 3 points PSFS developed by: [place] Spitz., & Binkley, J. (1995). Assessing disability and change on individual  patients: a report of a patient specific measure. Physiotherapy [place], 47, [phone]. Reproduced with the permission of the authors  Score: 13/30 43% perceived functional ability  COGNITION: Overall cognitive status: Within functional limits for tasks assessed    MUSCLE LENGTH: Hamstrings: Right 60 deg; Left 60 deg   POSTURE: No Significant postural limitations  PALPATION: deferred  LOWER EXTREMITY ROM:  A/PROM Right eval Left eval  Hip flexion    Hip extension    Hip abduction    Hip adduction    Hip internal rotation    Hip external rotation    Knee flexion    Knee extension -10/-2d -5/0d  Ankle dorsiflexion    Ankle plantarflexion    Ankle inversion    Ankle eversion     (Blank rows = not tested)  LOWER EXTREMITY MMT:  MMT Right eval Left eval  Hip flexion    Hip extension 4- 4-  Hip  abduction    Hip adduction    Hip internal rotation    Hip external rotation    Knee flexion    Knee extension 4- 4-  Ankle dorsiflexion    Ankle plantarflexion 4- 4-  Ankle inversion    Ankle eversion     (Blank rows = not tested)  LOWER EXTREMITY SPECIAL TESTS:  deferred  FUNCTIONAL TESTS:  30 seconds chair stand test 8 with UE assist needed  GAIT: Distance walked: 69ftx2 Assistive device utilized: Walker - 2 wheeled Level of assistance: Modified independence Comments: slow cadence  TREATMENT DATE:  [facility] Adult PT Treatment:                                                DATE: 09/05/23 Therapeutic Exercise: Nustep L8 8 min Seated hamstring stretch 30s x2 B Neuromuscular re-ed: Heel raise over 4 in step 15x2 Runners step 8 in 10/10 5# KB Therapeutic Activity: Omega knee extension 20# 15x2 Omega knee flexion 25# 15x2 Omega leg press 45# 15x B, 15/15 unilaterally 25#  [facility] Adult PT Treatment:                                                DATE: 08/30/23 Therapeutic Exercise: Nustep L8 8 min Seated hamstring stretch 30s x2 B Neuromuscular re-ed: Heel raise over 4 in step 15x2 Runners step 6 in 10/10 5# KB Therapeutic Activity: Omega knee extension 15# 15x2 Omega knee flexion 20# 15x2 Omega leg press 35# 15x2  [facility] Adult PT Treatment:                                                DATE: 08/23/23 Therapeutic Exercise: Nustep L8 8 min Seated hamstring stretch 30s x2 B Neuromuscular re-ed: Supine hip fallouts BlaTB 15x B, 15/15 unilaterally S/L clams BluTB 15/15x Bridge against BlaTB 15x Bridge with ball squeeze 15x Therapeutic Activity: Seated hamstring curls BlaTB 15/15 Runners step 4 in 10/10 10# KB STS from airex pad 10# KB 10x  [facility] Adult PT Treatment:                                                DATE: 08/16/23 Therapeutic  Exercise: Nustep L7 8 min Seated hamstring stretch 30s x2 B Neuromuscular re-ed: Supine hip fallouts BlaTB 15x B, 15/15 unilaterally S/L clams BluTB 15/15x Bridge against BlaTB 15x Bridge with ball squeeze 15x Therapeutic Activity: Seated hamstring curls BlaTB 15/15 Runners step 4 in 10/10 5# KB STS from airex pad 5# KB 10x  [facility] Adult PT Treatment:                                                DATE: 08/10/23 Therapeutic Exercise: Nustep L6 8 min Seated hamstring stretch 30s x2 B Neuromuscular re-ed: Bridge with ball squeeze 15x Bridge against BlaTB 12x FAQs with ball squeeze 15x2 Therapeutic Activity: 16 steps with cane and single rail progressing to step through pattern Supine hip fallouts BlaTB 12x B, 12/12 unilaterally S/L clams BluTB 12/12x Seated hamstring curls BlaTB 12/12  PATIENT EDUCATION:  Education details: Discussed eval findings, rehab rationale and POC and patient is in agreement  Person educated: Patient and Parent Education method: Explanation Education comprehension: verbalized understanding and needs further education  HOME EXERCISE PROGRAM: Access Code: [email or web address] URL: [email or web address] Date: 07/02/2023 Prepared by: Gretta Leavens  Exercises - Supine Knee Extension Strengthening  - 2 x daily -  5 x weekly - 1-2 sets - 15 reps - Supine Bridge  - 2 x daily - 5 x weekly - 1-2 sets - 15 reps - Seated Long Arc Quad  - 2 x daily - 5 x weekly - 1-2 sets - 15 reps - Heel Toe Raises with Counter Support  - 2 x daily - 5 x weekly - 1-2 sets - 15 reps - Seated Table Hamstring Stretch  - 2 x daily - 5 x weekly - 1 sets - 2 reps - 30s hold  ASSESSMENT:  CLINICAL IMPRESSION: Continued to challenge patient with increased weight, SL tasks and taller step.  Fatigue reported bu no increase in symptoms.  Patient is a 63 y.o. male who was seen today for physical therapy evaluation and treatment for BLE weakness following traumatic fractures requiring  ORIF. Patient presents with limited knee extension B, tight hamstrings and B LE weakness as evidenced by 30s chair stand test.  Further testing of balance and endurance warranted at upcoming sessions.  OBJECTIVE IMPAIRMENTS: Abnormal gait, decreased activity tolerance, decreased coordination, decreased endurance, decreased knowledge of condition, decreased mobility, difficulty walking, decreased strength, increased fascial restrictions, improper body mechanics, and pain.   ACTIVITY LIMITATIONS: carrying, lifting, standing, squatting, and stairs  PERSONAL FACTORS: Behavior pattern, Fitness, Past/current experiences, Time since onset of injury/illness/exacerbation, and Transportation are also affecting patient's functional outcome.   REHAB POTENTIAL: Fair based on needs   CLINICAL DECISION MAKING: Evolving/moderate complexity  EVALUATION COMPLEXITY: Moderate   GOALS: Goals reviewed with patient? No  SHORT TERM GOALS: Target date: 07/23/2023   Patient to demonstrate independence in HEP  Baseline: [id number] Goal status: MET  2.  Assess 2 MWT Baseline: TBD; 08/02/23 234ft w/cane  Goal status: Met  3.  Patient to negotiate 8 steps with most appropriate pattern Baseline: TBD; 08/10/23 See note Goal status: Met   LONG TERM GOALS: Target date: 08/13/2023   Patient will acknowledge 4/10 worst pain at least once during episode of care   Baseline: 7/10 worst pain; 08/16/23 4/10 Goal status: Met  2.  Patient will score at least 21/30 on PSFS to signify clinically meaningful improvement in functional abilities.   Baseline: 13/30 43% perceived functional ability Goal status: Ongoing  3.  Patient will increase 30s chair stand reps from 8 to 12 with arms to demonstrate and improved functional ability with less pain/difficulty as well as reduce fall risk.  Baseline: 8 Goal status: Ongoing  4.  Increase BLE strength to 4/5 Baseline:  MMT Right eval Left eval  Hip flexion    Hip extension 4-  4-  Hip abduction    Hip adduction    Hip internal rotation    Hip external rotation    Knee flexion    Knee extension 4- 4-  Ankle dorsiflexion    Ankle plantarflexion 4- 4-   Goal status: INITIAL  5.  Retest 2 MWT for progress made Baseline: TBD Goal status: Ongoing  6.  233ft ambulation with LRAD Baseline: 27ft with RW; 269ft + with cane outside of clinic Goal status: Ongoing   PLAN:  PT FREQUENCY: 1x/week  PT DURATION: 4 weeks  PLANNED INTERVENTIONS: 97164- PT Re-evaluation, 97110-Therapeutic exercises, 97530- Therapeutic activity, 97112- Neuromuscular re-education, 97535- Self Care, 54098- Manual therapy, [phone]- Gait training, Patient/Family education, Balance training, and Stair training  PLAN FOR NEXT SESSION: HEP review and update, manual techniques as appropriate, aerobic tasks, ROM and flexibility activities, strengthening and PREs, TPDN, gait and balance training as needed  For all possible CPT codes, reference the Planned Interventions line above.     Check all conditions that are expected to impact treatment: {Conditions expected to impact treatment:Contractures, spasticity or fracture relevant to requested treatment and Social determinants of health   If treatment provided at initial evaluation, no treatment charged due to lack of authorization.       Valin Massie M Dock Baccam, PT 09/05/2023, 4:02 PM

## 2023-09-05 ENCOUNTER — Ambulatory Visit

## 2023-09-05 DIAGNOSIS — S8292XA Unspecified fracture of left lower leg, initial encounter for closed fracture: Secondary | ICD-10-CM | POA: Diagnosis not present

## 2023-09-05 DIAGNOSIS — G8929 Other chronic pain: Secondary | ICD-10-CM

## 2023-09-05 DIAGNOSIS — S8292XS Unspecified fracture of left lower leg, sequela: Secondary | ICD-10-CM

## 2023-09-05 DIAGNOSIS — S8291XS Unspecified fracture of right lower leg, sequela: Secondary | ICD-10-CM

## 2023-09-11 ENCOUNTER — Ambulatory Visit

## 2023-09-11 DIAGNOSIS — G8929 Other chronic pain: Secondary | ICD-10-CM

## 2023-09-11 DIAGNOSIS — S8292XA Unspecified fracture of left lower leg, initial encounter for closed fracture: Secondary | ICD-10-CM | POA: Diagnosis not present

## 2023-09-11 DIAGNOSIS — S8292XS Unspecified fracture of left lower leg, sequela: Secondary | ICD-10-CM | POA: Diagnosis not present

## 2023-09-11 DIAGNOSIS — S8291XS Unspecified fracture of right lower leg, sequela: Secondary | ICD-10-CM

## 2023-09-11 NOTE — Therapy (Addendum)
 OUTPATIENT PHYSICAL THERAPY TREATMENT NOTE   Patient Name: [name] MRN: [id number] DOB:1960-05-31, 63 y.o., male Today's Date: 09/11/2023  END OF SESSION:  PT End of Session - 09/11/23 1321     Visit Number 15    Number of Visits 16    Date for PT Re-Evaluation 09/01/23    Authorization Type MCD    Authorization - Number of Visits 27    PT Start Time 1315    PT Stop Time 1355    PT Time Calculation (min) 40 min    Activity Tolerance Patient tolerated treatment well    Behavior During Therapy WFL for tasks assessed/performed              Past Medical History:  Diagnosis Date   Alcohol use disorder 10/29/2022   Anxiety and depression    Bipolar 1 disorder (HCC)    Cocaine use disorder, mild, abuse (HCC) 12/31/2015   Tibia fracture 01/04/2023   Type I or II open fracture of left tibia and fibula 01/08/2023   Wernicke encephalopathy 10/29/2022   Past Surgical History:  Procedure Laterality Date   EYE SURGERY     I & D EXTREMITY Right 01/05/2023   Procedure: IRRIGATION AND DEBRIDEMENT RIGHT FOREARM;  Surgeon: Laneta Pintos, MD;  Location: [facility] OR;  Service: Orthopedics;  Laterality: Right;   ORIF TIBIA FRACTURE Right 01/05/2023   Procedure: OPEN REDUCTION INTERNAL FIXATION (ORIF) TIBIA FRACTURE;  Surgeon: Laneta Pintos, MD;  Location: [facility] OR;  Service: Orthopedics;  Laterality: Right;   TIBIA IM NAIL INSERTION Left 01/05/2023   Procedure: INTRAMEDULLARY (IM) NAIL TIBIAL;  Surgeon: Laneta Pintos, MD;  Location: [facility] OR;  Service: Orthopedics;  Laterality: Left;   Patient Active Problem List   Diagnosis Date Noted   Bipolar disorder, in partial remission, most recent episode depressed (HCC) 01/08/2023   Type I or II open fracture of left tibia and fibula 01/08/2023   Tibia fracture 01/04/2023   Bipolar 1 disorder (HCC) 10/29/2022   Wernicke encephalopathy 10/29/2022   Alcohol use disorder 10/29/2022   Anxiety and depression    Bipolar affective disorder in  remission (HCC) 01/16/2016   Cocaine use disorder, mild, abuse (HCC) 12/31/2015    PCP: Senaida Dama, NP   REFERRING PROVIDER: Senaida Dama, NP  REFERRING DIAG: M79.604,M79.605,G89.29 (ICD-10-CM) - Chronic pain of both lower extremities S82.92XS (ICD-10-CM) - Leg fracture, left, sequela S82.91XS (ICD-10-CM) - Leg fracture, right, sequela  THERAPY DIAG:  Leg fracture, left, sequela - Plan: PT plan of care cert/re-cert  Chronic pain of both lower extremities - Plan: PT plan of care cert/re-cert  Leg fracture, right, sequela - Plan: PT plan of care cert/re-cert  Rationale for Evaluation and Treatment: Rehabilitation  ONSET DATE: chronic  SUBJECTIVE:   SUBJECTIVE STATEMENT: pain and soreness minimal.  Main concern is weaning from cane.  PERTINENT HISTORY: 2. Chronic pain of both lower extremities 3. Leg fracture, left, sequela 4. Leg fracture, right, sequela - Referral to Orthopedic Surgery and Rehabilitation for evaluation/management.  - Follow-up with primary provider as scheduled.  - Ambulatory referral to Orthopedic Surgery - AMB referral to rehabilitation   PAIN:  Are you having pain? Yes: NPRS scale: 0-7/10 Pain location: L lower leg Pain description: ache  Aggravating factors: weight bearing, prolonged walking Relieving factors: rest and position changes  PRECAUTIONS: None  RED FLAGS: None   WEIGHT BEARING RESTRICTIONS: No  FALLS:  Has patient fallen in last 6 months? No  LIVING ENVIRONMENT:  Lives with: lives with their family Lives in: House/apartment Stairs: yes Has following equipment at home: Otho Blitz - 2 wheeled  OCCUPATION: not working  PLOF: Independent with basic ADLs  PATIENT GOALS: To get around better  NEXT MD VISIT: TBD  OBJECTIVE:  Note: Objective measures were completed at Evaluation unless otherwise noted.  DIAGNOSTIC FINDINGS: IMPRESSION: 1. Comminuted ununited fracture of the proximal-mid tibial diaphysis transfixed with a  intramedullary nail and multiple interlocking screws. Antibiotic laden methylmethacrylate along the mid anterior tibial diaphysis within the tibial cortical defect. Small areas of bone bridging, but a majority of the fracture clefts persist without significant callus formation consistent with nonunion. 2. Oblique ununited fracture of the proximal fibular metadiaphysis without significant callus formation.     Electronically Signed   By: Onnie Bilis M.D.   On: 06/09/2023 09:03  PATIENT SURVEYS:  Patient-specific activity scoring scheme (Point to one number):  "0" represents "unable to perform." "10" represents "able to perform at prior level. 0 1 2 3 4 5 6 7 8 9  10 (Date and Score) Activity Initial  Activity Eval     Stair climbing  5    Arising from a chair  5    Prolonged walking  3    Additional Additional Total score = sum of the activity scores/number of activities Minimum detectable change (90%CI) for average score = 2 points Minimum detectable change (90%CI) for single activity score = 3 points PSFS developed by: [place] Spitz., & Binkley, J. (1995). Assessing disability and change on individual  patients: a report of a patient specific measure. Physiotherapy [place], 47, [phone]. Reproduced with the permission of the authors  Score: 13/30 43% perceived functional ability  COGNITION: Overall cognitive status: Within functional limits for tasks assessed    MUSCLE LENGTH: Hamstrings: Right 60 deg; Left 60 deg   POSTURE: No Significant postural limitations  PALPATION: deferred  LOWER EXTREMITY ROM:  A/PROM Right eval Left eval  Hip flexion    Hip extension    Hip abduction    Hip adduction    Hip internal rotation    Hip external rotation    Knee flexion    Knee extension -10/-2d -5/0d  Ankle dorsiflexion    Ankle plantarflexion    Ankle inversion    Ankle eversion     (Blank rows = not tested)  LOWER EXTREMITY MMT:  MMT  Right eval Left eval  Hip flexion    Hip extension 4- 4-  Hip abduction    Hip adduction    Hip internal rotation    Hip external rotation    Knee flexion    Knee extension 4- 4-  Ankle dorsiflexion    Ankle plantarflexion 4- 4-  Ankle inversion    Ankle eversion     (Blank rows = not tested)  LOWER EXTREMITY SPECIAL TESTS:  deferred  FUNCTIONAL TESTS:  30 seconds chair stand test 8 with UE assist needed  GAIT: Distance walked: 84ftx2 Assistive device utilized: Walker - 2 wheeled Level of assistance: Modified independence Comments: slow cadence  TREATMENT DATE:  [facility] Adult PT Treatment:                                                DATE: 09/11/23 Therapeutic Exercise: Nustep L4 8 min Seated hamstring stretch 30s x2 B  Neuromuscular re-ed:   09/11/23 0001  Berg Balance Test  Sit to Stand 4  Standing Unsupported 4  Sitting with Back Unsupported but Feet Supported on Floor or Stool 4  Stand to Sit 4  Transfers 4  Standing Unsupported with Eyes Closed 4  Standing Unsupported with Feet Together 4  From Standing, Reach Forward with Outstretched Arm 4  From Standing Position, Pick up Object from Floor 4  From Standing Position, Turn to Look Behind Over each Shoulder 4  Turn 360 Degrees 4  Standing Unsupported, Alternately Place Feet on Step/Stool 4  Standing Unsupported, One Foot in Front 3  Standing on One Leg 4  Total Score 55   Therapeutic Activity: Omega knee extension 20# 15x2 Omega knee flexion 30# 15x2 Omega leg press 55# 15x B, 15/15 unilaterally 35#  [facility] Adult PT Treatment:                                                DATE: 09/05/23 Therapeutic Exercise: Nustep L8 8 min Seated hamstring stretch 30s x2 B Neuromuscular re-ed: Heel raise over 4 in step 15x2 Runners step 8 in 10/10 5# KB Therapeutic Activity: Omega knee  extension 20# 15x2 Omega knee flexion 25# 15x2 Omega leg press 55# 15x B, 15/15 unilaterally 25#  [facility] Adult PT Treatment:                                                DATE: 08/30/23 Therapeutic Exercise: Nustep L8 8 min Seated hamstring stretch 30s x2 B Neuromuscular re-ed: Heel raise over 4 in step 15x2 Runners step 6 in 10/10 5# KB Therapeutic Activity: Omega knee extension 15# 15x2 Omega knee flexion 20# 15x2 Omega leg press 35# 15x2   PATIENT EDUCATION:  Education details: Discussed eval findings, rehab rationale and POC and patient is in agreement  Person educated: Patient and Parent Education method: Explanation Education comprehension: verbalized understanding and needs further education  HOME EXERCISE PROGRAM: Access Code: [email or web address] URL: [email or web address] Date: 07/02/2023 Prepared by: Gretta Leavens  Exercises - Supine Knee Extension Strengthening  - 2 x daily - 5 x weekly - 1-2 sets - 15 reps - Supine Bridge  - 2 x daily - 5 x weekly - 1-2 sets - 15 reps - Seated Long Arc Quad  - 2 x daily - 5 x weekly - 1-2 sets - 15 reps - Heel Toe Raises with Counter Support  - 2 x daily - 5 x weekly - 1-2 sets - 15 reps - Seated Table Hamstring Stretch  - 2 x daily - 5 x weekly - 1 sets - 2 reps - 30s hold  ASSESSMENT:  CLINICAL IMPRESSION: 55/56 on BERG indicating good static balance.  Increased weight on isotonic exercises but unable to advance resistance on knee extension.  Patient close to plateau in  function.  Assess dynamic balance at next session  Patient is a 63 y.o. male who was seen today for physical therapy evaluation and treatment for BLE weakness following traumatic fractures requiring ORIF. Patient presents with limited knee extension B, tight hamstrings and B LE weakness as evidenced by 30s chair stand test.  Further testing of balance and endurance warranted at upcoming sessions.  OBJECTIVE IMPAIRMENTS: Abnormal gait, decreased activity  tolerance, decreased coordination, decreased endurance, decreased knowledge of condition, decreased mobility, difficulty walking, decreased strength, increased fascial restrictions, improper body mechanics, and pain.   ACTIVITY LIMITATIONS: carrying, lifting, standing, squatting, and stairs  PERSONAL FACTORS: Behavior pattern, Fitness, Past/current experiences, Time since onset of injury/illness/exacerbation, and Transportation are also affecting patient's functional outcome.   REHAB POTENTIAL: Fair based on needs   CLINICAL DECISION MAKING: Evolving/moderate complexity  EVALUATION COMPLEXITY: Moderate   GOALS: Goals reviewed with patient? No  SHORT TERM GOALS: Target date: 07/23/2023   Patient to demonstrate independence in HEP  Baseline: [id number] Goal status: MET  2.  Assess 2 MWT Baseline: TBD; 08/02/23 257ft w/cane  Goal status: Met  3.  Patient to negotiate 8 steps with most appropriate pattern Baseline: TBD; 08/10/23 See note Goal status: Met   LONG TERM GOALS: Target date: 08/13/2023   Patient will acknowledge 4/10 worst pain at least once during episode of care   Baseline: 7/10 worst pain; 08/16/23 4/10 Goal status: Met  2.  Patient will score at least 21/30 on PSFS to signify clinically meaningful improvement in functional abilities.   Baseline: 13/30 43% perceived functional ability Goal status: Ongoing  3.  Patient will increase 30s chair stand reps from 8 to 12 with arms to demonstrate and improved functional ability with less pain/difficulty as well as reduce fall risk.  Baseline: 8 Goal status: Ongoing  4.  Increase BLE strength to 4/5 Baseline:  MMT Right eval Left eval  Hip flexion    Hip extension 4- 4-  Hip abduction    Hip adduction    Hip internal rotation    Hip external rotation    Knee flexion    Knee extension 4- 4-  Ankle dorsiflexion    Ankle plantarflexion 4- 4-   Goal status: INITIAL  5.  Retest 2 MWT for progress made Baseline:  TBD Goal status: Ongoing  6.  222ft ambulation with LRAD Baseline: 63ft with RW; 260ft + with cane outside of clinic Goal status: Ongoing   PLAN:  PT FREQUENCY: 1x/week  PT DURATION: 4 weeks  PLANNED INTERVENTIONS: 97164- PT Re-evaluation, 97110-Therapeutic exercises, 97530- Therapeutic activity, 97112- Neuromuscular re-education, 97535- Self Care, 54098- Manual therapy, [phone]- Gait training, Patient/Family education, Balance training, and Stair training  PLAN FOR NEXT SESSION: HEP review and update, manual techniques as appropriate, aerobic tasks, ROM and flexibility activities, strengthening and PREs, TPDN, gait and balance training as needed   For all possible CPT codes, reference the Planned Interventions line above.     Check all conditions that are expected to impact treatment: {Conditions expected to impact treatment:Contractures, spasticity or fracture relevant to requested treatment and Social determinants of health   If treatment provided at initial evaluation, no treatment charged due to lack of authorization.       Jamyah Folk M Zasha Belleau, PT 09/11/2023, 2:00 PM

## 2023-09-13 ENCOUNTER — Encounter (HOSPITAL_COMMUNITY): Payer: Self-pay | Admitting: Psychiatry

## 2023-09-13 ENCOUNTER — Other Ambulatory Visit (HOSPITAL_COMMUNITY): Payer: Self-pay

## 2023-09-13 ENCOUNTER — Ambulatory Visit (HOSPITAL_BASED_OUTPATIENT_CLINIC_OR_DEPARTMENT_OTHER): Admitting: Psychiatry

## 2023-09-13 VITALS — Wt 155.0 lb

## 2023-09-13 DIAGNOSIS — F99 Mental disorder, not otherwise specified: Secondary | ICD-10-CM

## 2023-09-13 DIAGNOSIS — F419 Anxiety disorder, unspecified: Secondary | ICD-10-CM

## 2023-09-13 DIAGNOSIS — F319 Bipolar disorder, unspecified: Secondary | ICD-10-CM

## 2023-09-13 DIAGNOSIS — F1021 Alcohol dependence, in remission: Secondary | ICD-10-CM

## 2023-09-13 DIAGNOSIS — F5105 Insomnia due to other mental disorder: Secondary | ICD-10-CM | POA: Diagnosis not present

## 2023-09-13 MED ORDER — ABILIFY ASIMTUFII 960 MG/3.2ML IM PRSY: 960.0000 mg | PREFILLED_SYRINGE | INTRAMUSCULAR | 2 refills | Status: DC

## 2023-09-13 MED ORDER — ARIPIPRAZOLE 10 MG PO TABS: 10.0000 mg | ORAL_TABLET | Freq: Every day | ORAL | 1 refills | Status: DC

## 2023-09-13 MED ORDER — TRAZODONE HCL 50 MG PO TABS: 50.0000 mg | ORAL_TABLET | Freq: Every evening | ORAL | 0 refills | Status: DC | PRN
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
## 2023-09-17 ENCOUNTER — Telehealth (HOSPITAL_COMMUNITY): Payer: Self-pay | Admitting: *Deleted

## 2023-09-17 NOTE — Telephone Encounter (Signed)
 He agreed to get the injection and that is why we send the prescription to the pharmacy.  I will discuss with him on his next appointment.

## 2023-09-17 NOTE — Telephone Encounter (Signed)
 Pt called to advise that he does not want to get LAI but will continue taking the oral Abilify . I did not see an appointment or any mention of LAI in your note. However there is an order for Abilify  Asimtufii 960mg . Pt has a f/u appointment scheduled for 11/01/23.

## 2023-09-17 NOTE — Therapy (Unsigned)
 OUTPATIENT PHYSICAL THERAPY TREATMENT NOTE/DISCHARGE   Patient Name: EMRYS MCKAMIE MRN: [id number] DOB:[birth year], 63 y.o., male Today's Date: 09/19/2023  END OF SESSION:  PT End of Session - 09/19/23 1403     Visit Number 16    Number of Visits 16    Date for PT Re-Evaluation 09/01/23    Authorization Type MCD    Authorization - Number of Visits 27    PT Start Time 1400    PT Stop Time 1438    PT Time Calculation (min) 38 min    Activity Tolerance Patient tolerated treatment well    Behavior During Therapy WFL for tasks assessed/performed               Past Medical History:  Diagnosis Date   Alcohol use disorder 10/29/2022   Anxiety and depression    Bipolar 1 disorder (HCC)    Cocaine use disorder, mild, abuse (HCC) 12/31/2015   Tibia fracture 01/04/2023   Type I or II open fracture of left tibia and fibula 01/08/2023   Wernicke encephalopathy 10/29/2022   Past Surgical History:  Procedure Laterality Date   EYE SURGERY     I & D EXTREMITY Right 01/05/2023   Procedure: IRRIGATION AND DEBRIDEMENT RIGHT FOREARM;  Surgeon: Laneta Pintos, MD;  Location: [facility] OR;  Service: Orthopedics;  Laterality: Right;   ORIF TIBIA FRACTURE Right 01/05/2023   Procedure: OPEN REDUCTION INTERNAL FIXATION (ORIF) TIBIA FRACTURE;  Surgeon: Laneta Pintos, MD;  Location: [facility] OR;  Service: Orthopedics;  Laterality: Right;   TIBIA IM NAIL INSERTION Left 01/05/2023   Procedure: INTRAMEDULLARY (IM) NAIL TIBIAL;  Surgeon: Laneta Pintos, MD;  Location: [facility] OR;  Service: Orthopedics;  Laterality: Left;   Patient Active Problem List   Diagnosis Date Noted   Bipolar disorder, in partial remission, most recent episode depressed (HCC) 01/08/2023   Type I or II open fracture of left tibia and fibula 01/08/2023   Tibia fracture 01/04/2023   Bipolar 1 disorder (HCC) 10/29/2022   Wernicke encephalopathy 10/29/2022   Alcohol use disorder 10/29/2022   Anxiety and depression    Bipolar affective  disorder in remission (HCC) 01/16/2016   Cocaine use disorder, mild, abuse (HCC) 12/31/2015    PCP: Senaida Dama, NP   REFERRING PROVIDER: Senaida Dama, NP  REFERRING DIAG: M79.604,M79.605,G89.29 (ICD-10-CM) - Chronic pain of both lower extremities S82.92XS (ICD-10-CM) - Leg fracture, left, sequela S82.91XS (ICD-10-CM) - Leg fracture, right, sequela  THERAPY DIAG:  Leg fracture, left, sequela  Chronic pain of both lower extremities  Leg fracture, right, sequela  Rationale for Evaluation and Treatment: Rehabilitation  ONSET DATE: chronic  SUBJECTIVE:   SUBJECTIVE STATEMENT: Doing well, less reliant on cane and able to DC bone stimulator.  Feels ready for independent management.  Feels 85%  PERTINENT HISTORY: 2. Chronic pain of both lower extremities 3. Leg fracture, left, sequela 4. Leg fracture, right, sequela - Referral to Orthopedic Surgery and Rehabilitation for evaluation/management.  - Follow-up with primary provider as scheduled.  - Ambulatory referral to Orthopedic Surgery - AMB referral to rehabilitation   PAIN:  Are you having pain? Yes: NPRS scale: 0-7/10 Pain location: L lower leg Pain description: ache  Aggravating factors: weight bearing, prolonged walking Relieving factors: rest and position changes  PRECAUTIONS: None  RED FLAGS: None   WEIGHT BEARING RESTRICTIONS: No  FALLS:  Has patient fallen in last 6 months? No  LIVING ENVIRONMENT: Lives with: lives with their family Lives in: House/apartment Stairs:  yes Has following equipment at home: Otho Blitz - 2 wheeled  OCCUPATION: not working  PLOF: Independent with basic ADLs  PATIENT GOALS: To get around better  NEXT MD VISIT: TBD  OBJECTIVE:  Note: Objective measures were completed at Evaluation unless otherwise noted.  DIAGNOSTIC FINDINGS: IMPRESSION: 1. Comminuted ununited fracture of the proximal-mid tibial diaphysis transfixed with a intramedullary nail and multiple  interlocking screws. Antibiotic laden methylmethacrylate along the mid anterior tibial diaphysis within the tibial cortical defect. Small areas of bone bridging, but a majority of the fracture clefts persist without significant callus formation consistent with nonunion. 2. Oblique ununited fracture of the proximal fibular metadiaphysis without significant callus formation.     Electronically Signed   By: Onnie Bilis M.D.   On: 06/09/2023 09:03  PATIENT SURVEYS:  Patient-specific activity scoring scheme (Point to one number):  "0" represents "unable to perform." "10" represents "able to perform at prior level. 0 1 2 3 4 5 6 7 8 9  10 (Date and Score) Activity Initial  Activity Eval   09/19/23  Stair climbing  5 5   Arising from a chair  5  7  Prolonged walking  3 8   Additional Additional Total score = sum of the activity scores/number of activities Minimum detectable change (90%CI) for average score = 2 points Minimum detectable change (90%CI) for single activity score = 3 points PSFS developed by: [place] Spitz., & Binkley, J. (1995). Assessing disability and change on individual  patients: a report of a patient specific measure. Physiotherapy [place], 47, [phone]. Reproduced with the permission of the authors  Score: 13/30 43% perceived functional ability 09/19/23 20/30 66%  COGNITION: Overall cognitive status: Within functional limits for tasks assessed    MUSCLE LENGTH: Hamstrings: Right 60 deg; Left 60 deg   POSTURE: No Significant postural limitations  PALPATION: deferred  LOWER EXTREMITY ROM:  A/PROM Right eval Left eval  Hip flexion    Hip extension    Hip abduction    Hip adduction    Hip internal rotation    Hip external rotation    Knee flexion    Knee extension -10/-2d -5/0d  Ankle dorsiflexion    Ankle plantarflexion    Ankle inversion    Ankle eversion     (Blank rows = not tested)  LOWER EXTREMITY MMT:  MMT  Right eval Left eval  Hip flexion    Hip extension 4- 4-  Hip abduction    Hip adduction    Hip internal rotation    Hip external rotation    Knee flexion    Knee extension 4- 4-  Ankle dorsiflexion    Ankle plantarflexion 4- 4-  Ankle inversion    Ankle eversion     (Blank rows = not tested)  LOWER EXTREMITY SPECIAL TESTS:  deferred  FUNCTIONAL TESTS:  30 seconds chair stand test 8 with UE assist needed 09/19/23 15 reps  GAIT: Distance walked: 26ftx2 Assistive device utilized: Environmental consultant - 2 wheeled Level of assistance: Modified independence Comments: slow cadence  TREATMENT DATE:  [facility] Adult PT Treatment:                                                DATE: 09/19/23 Therapeutic Exercise: Nustep L6 8 min Neuromuscular re-ed:   09/19/23 0001  Dynamic Gait Index  Level Surface 3  Change in Gait Speed 3  Gait with Horizontal Head Turns 3  Gait with Vertical Head Turns 3  Gait and Pivot Turn 3  Step Over Obstacle 3  Step Around Obstacles 3  Steps 2  Total Score 23   Therapeutic Activity: Omega knee extension 20# 15x2 Omega knee flexion 35# 15x2 Omega leg press 65# 15x B, 15/15 unilaterally 35#  [facility] Adult PT Treatment:                                                DATE: 09/11/23 Therapeutic Exercise: Nustep L4 8 min Seated hamstring stretch 30s x2 B  Neuromuscular re-ed:   09/11/23 0001  Berg Balance Test  Sit to Stand 4  Standing Unsupported 4  Sitting with Back Unsupported but Feet Supported on Floor or Stool 4  Stand to Sit 4  Transfers 4  Standing Unsupported with Eyes Closed 4  Standing Unsupported with Feet Together 4  From Standing, Reach Forward with Outstretched Arm 4  From Standing Position, Pick up Object from Floor 4  From Standing Position, Turn to Look Behind Over each Shoulder 4  Turn 360 Degrees 4  Standing  Unsupported, Alternately Place Feet on Step/Stool 4  Standing Unsupported, One Foot in Front 3  Standing on One Leg 4  Total Score 55   Therapeutic Activity: Omega knee extension 20# 15x2 Omega knee flexion 30# 15x2 Omega leg press 55# 15x B, 15/15 unilaterally 35#  [facility] Adult PT Treatment:                                                DATE: 09/05/23 Therapeutic Exercise: Nustep L8 8 min Seated hamstring stretch 30s x2 B Neuromuscular re-ed: Heel raise over 4 in step 15x2 Runners step 8 in 10/10 5# KB Therapeutic Activity: Omega knee extension 20# 15x2 Omega knee flexion 25# 15x2 Omega leg press 55# 15x B, 15/15 unilaterally 25#  [facility] Adult PT Treatment:                                                DATE: 08/30/23 Therapeutic Exercise: Nustep L8 8 min Seated hamstring stretch 30s x2 B Neuromuscular re-ed: Heel raise over 4 in step 15x2 Runners step 6 in 10/10 5# KB Therapeutic Activity: Omega knee extension 15# 15x2 Omega knee flexion 20# 15x2 Omega leg press 35# 15x2   PATIENT EDUCATION:  Education details: Discussed eval findings, rehab rationale and POC and patient is in agreement  Person educated: Patient and Parent Education method: Explanation Education comprehension: verbalized understanding and needs further education  HOME EXERCISE PROGRAM: Access Code: [email or web address] URL: [email or web address] Date: 07/02/2023 Prepared  by: Gretta Leavens  Exercises - Supine Knee Extension Strengthening  - 2 x daily - 5 x weekly - 1-2 sets - 15 reps - Supine Bridge  - 2 x daily - 5 x weekly - 1-2 sets - 15 reps - Seated Long Arc Quad  - 2 x daily - 5 x weekly - 1-2 sets - 15 reps - Heel Toe Raises with Counter Support  - 2 x daily - 5 x weekly - 1-2 sets - 15 reps - Seated Table Hamstring Stretch  - 2 x daily - 5 x weekly - 1 sets - 2 reps - 30s hold  ASSESSMENT:  CLINICAL IMPRESSION: Patient scores 23/24 on DGI.  All goals met and patient ready to transition  to self care  Patient is a 63 y.o. male who was seen today for physical therapy evaluation and treatment for BLE weakness following traumatic fractures requiring ORIF. Patient presents with limited knee extension B, tight hamstrings and B LE weakness as evidenced by 30s chair stand test.  Further testing of balance and endurance warranted at upcoming sessions.  OBJECTIVE IMPAIRMENTS: Abnormal gait, decreased activity tolerance, decreased coordination, decreased endurance, decreased knowledge of condition, decreased mobility, difficulty walking, decreased strength, increased fascial restrictions, improper body mechanics, and pain.   ACTIVITY LIMITATIONS: carrying, lifting, standing, squatting, and stairs  PERSONAL FACTORS: Behavior pattern, Fitness, Past/current experiences, Time since onset of injury/illness/exacerbation, and Transportation are also affecting patient's functional outcome.   REHAB POTENTIAL: Fair based on needs   CLINICAL DECISION MAKING: Evolving/moderate complexity  EVALUATION COMPLEXITY: Moderate   GOALS: Goals reviewed with patient? No  SHORT TERM GOALS: Target date: 07/23/2023   Patient to demonstrate independence in HEP  Baseline: [id number] Goal status: MET  2.  Assess 2 MWT Baseline: TBD; 08/02/23 246ft w/cane  Goal status: Met  3.  Patient to negotiate 8 steps with most appropriate pattern Baseline: TBD; 08/10/23 See note Goal status: Met   LONG TERM GOALS: Target date: 08/13/2023   Patient will acknowledge 4/10 worst pain at least once during episode of care   Baseline: 7/10 worst pain; 08/16/23 4/10 Goal status: Met  2.  Patient will score at least 21/30 on PSFS to signify clinically meaningful improvement in functional abilities.   Baseline: 13/30 43% perceived functional ability; 09/19/23 20/30 Goal status: Met  3.  Patient will increase 30s chair stand reps from 8 to 12 with arms to demonstrate and improved functional ability with less pain/difficulty  as well as reduce fall risk.  Baseline: 8; 09/19/23 09/19/23 15 reps Goal status: Met  4.  Increase BLE strength to 4/5 Baseline:  MMT Right eval Left eval  Hip flexion    Hip extension 4- 4-  Hip abduction    Hip adduction    Hip internal rotation    Hip external rotation    Knee flexion    Knee extension 4- 4-  Ankle dorsiflexion    Ankle plantarflexion 4- 4-   Goal status: Met as per 30s chair stand test  5.  Retest 2 MWT for progress made Baseline: TBD; 09/19/23 300 ft Goal status: Met  6.  242ft ambulation with LRAD Baseline: 76ft with RW; 277ft + with cane outside of clinic Goal status: Met   PLAN:  PT FREQUENCY: 1x/week  PT DURATION: 4 weeks  PLANNED INTERVENTIONS: 97164- PT Re-evaluation, 97110-Therapeutic exercises, 97530- Therapeutic activity, 97112- Neuromuscular re-education, 97535- Self Care, 47829- Manual therapy, [phone]- Gait training, Patient/Family education, Balance training, and Stair training  PLAN FOR  NEXT SESSION: HEP review and update, manual techniques as appropriate, aerobic tasks, ROM and flexibility activities, strengthening and PREs, TPDN, gait and balance training as needed   For all possible CPT codes, reference the Planned Interventions line above.     Check all conditions that are expected to impact treatment: {Conditions expected to impact treatment:Contractures, spasticity or fracture relevant to requested treatment and Social determinants of health   If treatment provided at initial evaluation, no treatment charged due to lack of authorization.       Lottie Sigman M Comfort Iversen, PT 09/19/2023, 3:55 PM

## 2023-09-18 NOTE — Telephone Encounter (Signed)
 Clifford thanks

## 2023-09-19 ENCOUNTER — Ambulatory Visit: Attending: Family

## 2023-09-19 DIAGNOSIS — G8929 Other chronic pain: Secondary | ICD-10-CM | POA: Diagnosis present

## 2023-09-19 DIAGNOSIS — M79605 Pain in left leg: Secondary | ICD-10-CM | POA: Diagnosis present

## 2023-09-19 DIAGNOSIS — M79604 Pain in right leg: Secondary | ICD-10-CM | POA: Insufficient documentation

## 2023-09-19 DIAGNOSIS — S8291XS Unspecified fracture of right lower leg, sequela: Secondary | ICD-10-CM | POA: Diagnosis present

## 2023-09-19 DIAGNOSIS — S8292XS Unspecified fracture of left lower leg, sequela: Secondary | ICD-10-CM | POA: Diagnosis present

## 2023-09-19 DIAGNOSIS — S8292XA Unspecified fracture of left lower leg, initial encounter for closed fracture: Secondary | ICD-10-CM | POA: Insufficient documentation

## 2023-09-19 DIAGNOSIS — S8291XA Unspecified fracture of right lower leg, initial encounter for closed fracture: Secondary | ICD-10-CM | POA: Diagnosis not present

## 2023-09-24 ENCOUNTER — Other Ambulatory Visit: Payer: Self-pay | Admitting: Family

## 2023-09-24 NOTE — Telephone Encounter (Unsigned)
 Copied from CRM [id number]. Topic: Clinical - Medication Refill >> Sep 24, 2023 12:19 PM Tiffany S wrote: Medication: metoprolol  succinate (TOPROL -XL) 25 MG 24 hr tablet [[id number]]  Has the patient contacted their pharmacy? Yes (Agent: If no, request that the patient contact the pharmacy for the refill. If patient does not wish to contact the pharmacy document the reason why and proceed with request.) (Agent: If yes, when and what did the pharmacy advise?)  This is the patient's preferred pharmacy:  [facility] [address] [place] - [address] AT [address] [place] [address] Phone: [phone] Fax: [phone]  Is this the correct pharmacy for this prescription? Yes If no, delete pharmacy and type the correct one.   Has the prescription been filled recently? Yes  Is the patient out of the medication? Yes  Has the patient been seen for an appointment in the last year OR does the patient have an upcoming appointment? Yes  Can we respond through MyChart? Yes  Agent: Please be advised that Rx refills may take up to 3 business days. We ask that you follow-up with your pharmacy.

## 2023-09-25 MED ORDER — METOPROLOL SUCCINATE ER 25 MG PO TB24: 25.0000 mg | ORAL_TABLET | Freq: Every day | ORAL | 0 refills | Status: DC

## 2023-09-25 NOTE — Telephone Encounter (Signed)
 Complete

## 2023-09-25 NOTE — Telephone Encounter (Signed)
 Requested medication (s) are due for refill today:   Not sure  Requested medication (s) are on the active medication list:   Yes as historical  Future visit scheduled:   No    Established care with Amy on 05/17/2023.   Not seen since   Last ordered: Metoprolol  last prescribed by a historical provider.      Requested Prescriptions  Pending Prescriptions Disp Refills   metoprolol  succinate (TOPROL -XL) 25 MG 24 hr tablet      Sig: Take 1 tablet (25 mg total) by mouth daily.     Cardiovascular:  Beta Blockers Passed - 09/25/2023 12:44 PM      Passed - Last BP in normal range    BP Readings from Last 1 Encounters:  07/23/23 108/68         Passed - Last Heart Rate in normal range    Pulse Readings from Last 1 Encounters:  07/23/23 62         Passed - Valid encounter within last 6 months    Recent Outpatient Visits           4 months ago Encounter to establish care   [facility], Annalee Barren, NP

## 2023-11-01 ENCOUNTER — Ambulatory Visit (HOSPITAL_BASED_OUTPATIENT_CLINIC_OR_DEPARTMENT_OTHER): Admitting: Psychiatry

## 2023-11-01 ENCOUNTER — Encounter (HOSPITAL_COMMUNITY): Payer: Self-pay | Admitting: Psychiatry

## 2023-11-01 VITALS — BP 109/71 | Resp 12 | Wt 162.0 lb

## 2023-11-01 DIAGNOSIS — F99 Mental disorder, not otherwise specified: Secondary | ICD-10-CM

## 2023-11-01 DIAGNOSIS — F319 Bipolar disorder, unspecified: Secondary | ICD-10-CM

## 2023-11-01 DIAGNOSIS — F5105 Insomnia due to other mental disorder: Secondary | ICD-10-CM | POA: Diagnosis not present

## 2023-11-01 DIAGNOSIS — F1021 Alcohol dependence, in remission: Secondary | ICD-10-CM

## 2023-11-01 DIAGNOSIS — F419 Anxiety disorder, unspecified: Secondary | ICD-10-CM

## 2023-11-01 MED ORDER — ARIPIPRAZOLE 10 MG PO TABS: 10.0000 mg | ORAL_TABLET | Freq: Every day | ORAL | 2 refills | Status: DC

## 2023-11-01 NOTE — Progress Notes (Signed)
 BH MD/PA/NP OP Progress Note  11/01/2023 1:09 PM [name]  MRN:  [id number]  Chief Complaint:  Chief Complaint  Patient presents with   Follow-up   HPI: Patient came today to the office for his follow-up appointment.  He was seen 6 weeks ago as a referral from primary care for the management of his psychiatric symptoms.  He was seeing psychiatrist at [facility] for a while until no longer able to see due to his insurance.  He has a history of bipolar disorder, anxiety, alcohol dependence and chronic insomnia.  He claimed to be remained sober from drinking for more than a year after your accident in which he fractured his leg.  After the physical therapy he is doing much better.  He is able to walk without the support of cain but is still have some residual pain in his leg.  He also received a phone call from Social Security disability that cases moving forward and he may get the disability in few weeks.  Due to the history of noncompliance we have discussed Abilify  injection but patient refused and did not want in the injection.  He reported taking the Abilify  pills every day and he noticed that had helped his mood much better.  He denies any recent mania, psychosis, hallucination.  He denies any crying spells or any feeling of hopelessness or worthlessness.  He has a history of multiple hospitalization due to decompensation and not taking the medication.  He has a history of DUI but realize that he need to remain sober for his general health.  He recently bought a used car that helps the transportation and he can go to the places.  He like to go.  Patient lives with his elderly mother.  He has not seen his children in a while.  Patient has a brother who lives in [place] and he is in close contact with him.  He denies any tremors or shakes or any EPS.  His appetite is okay.  He admitted few pounds weight gain but no other major concern.  Patient used to work as a Museum/gallery exhibitions officer but  now hoping to have a disability go through.  He has taken few times trazodone  but like to be sleep on his own and avoid taking the medication.  He denies any panic attack or aggressive behavior.  He denies any impulsive behavior.  He is not interested in therapy.  Visit Diagnosis:    ICD-10-CM   1. Bipolar 1 disorder (HCC)  F31.9 ARIPiprazole  (ABILIFY ) 10 MG tablet    2. Anxiety  F41.9 ARIPiprazole  (ABILIFY ) 10 MG tablet    3. Insomnia due to other mental disorder  F51.05 ARIPiprazole  (ABILIFY ) 10 MG tablet   F99     4. Alcohol dependence in remission (HCC)  F10.21 ARIPiprazole  (ABILIFY ) 10 MG tablet      Past Psychiatric History: Reviewed History of bipolar disorder with multiple hospitalization due to noncompliant with medication and manic episodes.  Took lithium , Seroquel , trazodone , Klonopin, hydroxyzine , Wellbutrin .  History of cocaine use, DUI and Wernicke's encephalopathy.  He is to see psychiatrist and therapist at [facility] [place].    Past Medical History:  Past Medical History:  Diagnosis Date   Alcohol use disorder 10/29/2022   Anxiety and depression    Bipolar 1 disorder (HCC)    Cocaine use disorder, mild, abuse (HCC) 12/31/2015   Tibia fracture 01/04/2023   Type I or II open fracture of left tibia and  fibula 01/08/2023   Wernicke encephalopathy 10/29/2022    Past Surgical History:  Procedure Laterality Date   EYE SURGERY     I & D EXTREMITY Right 01/05/2023   Procedure: IRRIGATION AND DEBRIDEMENT RIGHT FOREARM;  Surgeon: Kendal Franky SQUIBB, MD;  Location: [facility] OR;  Service: Orthopedics;  Laterality: Right;   ORIF TIBIA FRACTURE Right 01/05/2023   Procedure: OPEN REDUCTION INTERNAL FIXATION (ORIF) TIBIA FRACTURE;  Surgeon: Kendal Franky SQUIBB, MD;  Location: [facility] OR;  Service: Orthopedics;  Laterality: Right;   TIBIA IM NAIL INSERTION Left 01/05/2023   Procedure: INTRAMEDULLARY (IM) NAIL TIBIAL;  Surgeon: Kendal Franky SQUIBB, MD;  Location: [facility] OR;  Service: Orthopedics;   Laterality: Left;    Family Psychiatric History: Reviewed  Family History:  Family History  Problem Relation Age of Onset   Hyperlipidemia Mother    Alcoholism Mother    Suicidality Cousin     Social History:  Social History   Socioeconomic History   Marital status: Widowed    Spouse name: Not on file   Number of children: Not on file   Years of education: Not on file   Highest education level: Not on file  Occupational History   Not on file  Tobacco Use   Smoking status: Some Days    Types: Cigars   Smokeless tobacco: Never  Vaping Use   Vaping status: Never Used  Substance and Sexual Activity   Alcohol use: Not Currently    Comment: weekly   Drug use: Not Currently   Sexual activity: Not on file  Other Topics Concern   Not on file  Social History Narrative   Not on file   Social Drivers of Health   Financial Resource Strain: Low Risk  (05/17/2023)   Overall Financial Resource Strain (CARDIA)    Difficulty of Paying Living Expenses: Not hard at all  Food Insecurity: No Food Insecurity (01/06/2023)   Hunger Vital Sign    Worried About Running Out of Food in the Last Year: Never true    Ran Out of Food in the Last Year: Never true  Transportation Needs: No Transportation Needs (01/06/2023)   PRAPARE - Administrator, Civil Service (Medical): No    Lack of Transportation (Non-Medical): No  Physical Activity: Not on file  Stress: No Stress Concern Present (05/17/2023)   [employer] of Occupational Health - Occupational Stress Questionnaire    Feeling of Stress : Only a little  Social Connections: Not on file    Allergies: No Known Allergies  Metabolic Disorder Labs: Lab Results  Component Value Date   HGBA1C 5.5 12/31/2015   MPG 111 12/31/2015   MPG 114 12/30/2015   Lab Results  Component Value Date   PROLACTIN 16.0 (H) 12/31/2015   PROLACTIN 5.5 12/30/2015   Lab Results  Component Value Date   CHOL 148 10/29/2022   TRIG 80  10/29/2022   HDL 43 10/29/2022   CHOLHDL 3.4 10/29/2022   VLDL 16 10/29/2022   LDLCALC 89 10/29/2022   LDLCALC 144 (H) 12/31/2015   Lab Results  Component Value Date   TSH 0.374 10/29/2022   TSH 1.985 12/31/2015    Therapeutic Level Labs: Lab Results  Component Value Date   LITHIUM  0.59 (L) 01/15/2023   LITHIUM  0.29 (L) 01/12/2023   No results found for: VALPROATE No results found for: CBMZ  Current Medications: Current Outpatient Medications  Medication Sig Dispense Refill   ARIPiprazole  (ABILIFY ) 10 MG tablet Take 1 tablet (10  mg total) by mouth daily. 30 tablet 1   Cholecalciferol  (VITAMIN D -3) 125 MCG (5000 UT) TABS Take 125 mcg by mouth daily.     metoprolol  succinate (TOPROL -XL) 25 MG 24 hr tablet Take 1 tablet (25 mg total) by mouth daily. 90 tablet 0   traZODone  (DESYREL ) 50 MG tablet Take 1 tablet (50 mg total) by mouth at bedtime as needed for sleep. 20 tablet 0   ARIPiprazole  ER (ABILIFY  ASIMTUFII) 960 MG/3.2ML PRSY Inject 960 mg into the muscle every 2 (two) months. (Patient not taking: Reported on 11/01/2023) 1 mL 2   No current facility-administered medications for this visit.     Musculoskeletal: Strength & Muscle Tone: within normal limits Gait & Station: normal Patient leans: N/A  Psychiatric Specialty Exam: Review of Systems  Blood pressure 109/71, resp. rate 12, weight 162 lb (73.5 kg).There is no height or weight on file to calculate BMI.  General Appearance: Fairly Groomed  Eye Contact:  Fair  Speech:  Slow  Volume:  Decreased  Mood:  Euthymic  Affect:  Appropriate  Thought Process:  Goal Directed  Orientation:  Full (Time, Place, and Person)  Thought Content: Logical   Suicidal Thoughts:  No  Homicidal Thoughts:  No  Memory:  Immediate;   Good Recent;   Good Remote;   Fair  Judgement:  Intact  Insight:  Present  Psychomotor Activity:  Decreased  Concentration:  Concentration: Fair and Attention Span: Fair  Recall:  [employer] of  Knowledge: Fair  Language: Fair  Akathisia:  No  Handed:  Right  AIMS (if indicated): not done  Assets:  Communication Skills Desire for Improvement Housing Social Support Transportation  ADL's:  Intact  Cognition: WNL  Sleep:  Fair   Screenings: AIMS    Flowsheet Row [facility]  AIMS Total Score 0   AUDIT    Flowsheet Row [facility] ([facility]  Alcohol Use Disorder Identification Test Final Score (AUDIT) 23   CAGE-AID    Flowsheet Row ED to Hosp-Admission (Discharged) [facility]  CAGE-AID Score 0   GAD-7    Flowsheet Row [facility] Visit from [facility]/17/2023 in [facility]  Total GAD-7 Score 3 6   PHQ2-9    Flowsheet Row [facility] Visit [facility]23 [facility] Office Visit from 09/13/2023 [facility] from 07/11/2023 in [facility] Office Visit from 05/17/2023 in [facility]  PHQ-2 Total Score 0 2 0 3  PHQ-9 Total Score 2 6 -- 8   Flowsheet Row [facility] Visit [facility] 11/01[facility]23 [facility] Office Visit from 09/13/2023 [facility] from 07/11/2023 in [facility]  C-SSRS RISK CATEGORY Error: Question 6 not populated No Risk No Risk     Assessment and Plan: Patient is 63 year old Caucasian divorced man with history of bipolar disorder, alcohol use, anxiety and chronic insomnia.  Currently doing very well on Abilify  10 mg.  He is not interested in injection but also realized that medicine helping and he would like to take it as  prescribed every day.  He does not need a new prescription of trazodone .  Discussed medication side effects and  benefits.  He is hoping his disability go through soon.  He is not interested in therapy.  Remains sober from drinking for a while.  Recommend to call us  back with any question or any concern.  Follow-up in 3 months.  Collaboration of Care: Collaboration of Care: Other provider involved in patient's care AEB notes are available in epic to review  Patient/Guardian was advised Release of Information must be obtained prior to any record release in order to collaborate their care with an outside provider. Patient/Guardian was advised if they have not already done so to contact the registration department to sign all necessary forms in order for us  to release information regarding their care.   Consent: Patient/Guardian gives verbal consent for treatment and assignment of benefits for services provided during this visit. Patient/Guardian expressed understanding and agreed to proceed.    Leni ONEIDA Client, MD 11/01/2023, 1:09 PM

## 2023-12-31 ENCOUNTER — Other Ambulatory Visit: Payer: Self-pay | Admitting: Family

## 2023-12-31 NOTE — Telephone Encounter (Unsigned)
 Copied from CRM [id number]. Topic: Clinical - Medication Refill >> Dec 31, 2023 12:21 PM Delon T wrote: Medication: metoprolol  succinate (TOPROL -XL) 25 MG 24 hr tablet  Has the patient contacted their pharmacy? No (Agent: If no, request that the patient contact the pharmacy for the refill. If patient does not wish to contact the pharmacy document the reason why and proceed with request.) (Agent: If yes, when and what did the pharmacy advise?)  This is the patient's preferred pharmacy:  [facility] [address] [place] - [address] [place] [address] Phone: [phone] Fax: [phone]  Is this the correct pharmacy for this prescription? Yes If no, delete pharmacy and type the correct one.   Has the prescription been filled recently? Yes  Is the patient out of the medication? Yes  Has the patient been seen for an appointment in the last year OR does the patient have an upcoming appointment? Yes  Can we respond through MyChart? Yes  Agent: Please be advised that Rx refills may take up to 3 business days. We ask that you follow-up with your pharmacy.

## 2024-01-01 MED ORDER — METOPROLOL SUCCINATE ER 25 MG PO TB24: 25.0000 mg | ORAL_TABLET | Freq: Every day | ORAL | 0 refills | Status: DC

## 2024-01-01 NOTE — Telephone Encounter (Signed)
 Courtesy refill. Patient will need an office visit for additional refills.  Requested Prescriptions  Pending Prescriptions Disp Refills   metoprolol  succinate (TOPROL -XL) 25 MG 24 hr tablet 30 tablet 0    Sig: Take 1 tablet (25 mg total) by mouth daily.     Cardiovascular:  Beta Blockers Failed - 01/01/2024  4:49 PM      Failed - Valid encounter within last 6 months    Recent Outpatient Visits           7 months ago Encounter to establish care   [facility], Amy J, NP              Passed - Last BP in normal range    BP Readings from Last 1 Encounters:  07/23/23 108/68         Passed - Last Heart Rate in normal range    Pulse Readings from Last 1 Encounters:  07/23/23 62

## 2024-01-03 ENCOUNTER — Telehealth: Payer: Self-pay | Admitting: Family

## 2024-01-03 NOTE — Telephone Encounter (Signed)
 Patient came in the office today and requested medication refill, Please advise.   metoprolol  succinate (TOPROL -XL) 25 MG 24 hr tablet

## 2024-01-07 NOTE — Telephone Encounter (Signed)
 Metoprolol  Succinate prescribed (01/01/2024  4:49 PM EDT).

## 2024-01-07 NOTE — Telephone Encounter (Signed)
 I called and made patient aware that prescription was prescribed on 01/01/2024

## 2024-02-07 ENCOUNTER — Encounter (HOSPITAL_COMMUNITY): Payer: Self-pay | Admitting: Psychiatry

## 2024-02-07 ENCOUNTER — Other Ambulatory Visit: Payer: Self-pay

## 2024-02-07 ENCOUNTER — Ambulatory Visit (HOSPITAL_COMMUNITY): Admitting: Psychiatry

## 2024-02-07 VITALS — BP 122/80 | HR 56 | Ht 71.0 in | Wt 167.0 lb

## 2024-02-07 DIAGNOSIS — F319 Bipolar disorder, unspecified: Secondary | ICD-10-CM | POA: Diagnosis not present

## 2024-02-07 DIAGNOSIS — R259 Unspecified abnormal involuntary movements: Secondary | ICD-10-CM | POA: Diagnosis not present

## 2024-02-07 DIAGNOSIS — F1021 Alcohol dependence, in remission: Secondary | ICD-10-CM | POA: Diagnosis not present

## 2024-02-07 MED ORDER — BENZTROPINE MESYLATE 1 MG PO TABS: 0.5000 mg | ORAL_TABLET | Freq: Every day | ORAL | 2 refills | Status: DC

## 2024-02-07 MED ORDER — ARIPIPRAZOLE 10 MG PO TABS: 10.0000 mg | ORAL_TABLET | Freq: Every day | ORAL | 2 refills | Status: DC

## 2024-02-07 NOTE — Progress Notes (Signed)
 BH MD/PA/NP OP Progress Note  02/07/2024 3:21 PM [name]  MRN:  [id number]  Chief Complaint:  Chief Complaint  Patient presents with   Follow-up   Medication Refill   HPI: Patient came to the office for his follow-up appointment.  He is taking Abilify  10 mg every day.  He not taking trazodone  because sleep is good.  He denies any major panic attack.  He remains sober from drinking since last year and he feels proud of it.  Today patient told that he had done some research on the Internet about Abilify .  He is complaining about jerky movements every day specially when he tried to lie down and relax.  These movements are involuntary but mostly in the evening.  He denies any tremors or shakes but wondering if he has akathisia.  He read that Abilify  can cause akathisia.  He reported Abilify  is helping and he denies any mania, psychosis, hallucination or any paranoia.  He sleeps good.  He does not have any negative thoughts and denies any active or passive suicidal thoughts or homicidal thoughts.  He is pleased that his disability is approved however due to government shutdown he has not get the paid.  He reported chronic pain in his leg but going to see the orthopedics in few days for further recommendations.  He finished physical therapy.  He like something to help those jerky movements.  Visit Diagnosis:    ICD-10-CM   1. Bipolar 1 disorder (HCC)  F31.9 ARIPiprazole  (ABILIFY ) 10 MG tablet    benztropine (COGENTIN) 1 MG tablet    2. Alcohol dependence in remission (HCC)  F10.21 ARIPiprazole  (ABILIFY ) 10 MG tablet    benztropine (COGENTIN) 1 MG tablet    3. Involuntary movements  R25.9 benztropine (COGENTIN) 1 MG tablet       Past Psychiatric History: Reviewed History of bipolar disorder with multiple hospitalization due to noncompliant with medication and manic episodes.  Took lithium , Seroquel , trazodone , Klonopin, hydroxyzine , Wellbutrin .  History of cocaine use, DUI and Wernicke's  encephalopathy.  He is to see psychiatrist and therapist at [facility] [place].    Past Medical History:  Past Medical History:  Diagnosis Date   Alcohol use disorder 10/29/2022   Anxiety and depression    Bipolar 1 disorder (HCC)    Cocaine use disorder, mild, abuse (HCC) 12/31/2015   Tibia fracture 01/04/2023   Type I or II open fracture of left tibia and fibula 01/08/2023   Wernicke encephalopathy 10/29/2022    Past Surgical History:  Procedure Laterality Date   EYE SURGERY     I & D EXTREMITY Right 01/05/2023   Procedure: IRRIGATION AND DEBRIDEMENT RIGHT FOREARM;  Surgeon: Kendal Franky SQUIBB, MD;  Location: [facility] OR;  Service: Orthopedics;  Laterality: Right;   ORIF TIBIA FRACTURE Right 01/05/2023   Procedure: OPEN REDUCTION INTERNAL FIXATION (ORIF) TIBIA FRACTURE;  Surgeon: Kendal Franky SQUIBB, MD;  Location: [facility];  Service: Orthopedics;  Laterality: Right;   TIBIA IM NAIL INSERTION Left 01/05/2023   Procedure: INTRAMEDULLARY (IM) NAIL TIBIAL;  Surgeon: Kendal Franky SQUIBB, MD;  Location: [facility];  Service: Orthopedics;  Laterality: Left;    Family Psychiatric History: Reviewed  Family History:  Family History  Problem Relation Age of Onset   Hyperlipidemia Mother    Alcoholism Mother    Suicidality Cousin     Social History:  Social History   Socioeconomic History   Marital status: Widowed    Spouse name: Not on file  Number of children: Not on file   Years of education: Not on file   Highest education level: Not on file  Occupational History   Not on file  Tobacco Use   Smoking status: Some Days    Types: Cigars   Smokeless tobacco: Never  Vaping Use   Vaping status: Never Used  Substance and Sexual Activity   Alcohol use: Not Currently    Comment: weekly   Drug use: Not Currently   Sexual activity: Not on file  Other Topics Concern   Not on file  Social History Narrative   Not on file   Social Drivers of Health   Financial Resource Strain: Low Risk   (05/17/2023)   Overall Financial Resource Strain (CARDIA)    Difficulty of Paying Living Expenses: Not hard at all  Food Insecurity: No Food Insecurity (01/06/2023)   Hunger Vital Sign    Worried About Running Out of Food in the Last Year: Never true    Ran Out of Food in the Last Year: Never true  Transportation Needs: No Transportation Needs (01/06/2023)   PRAPARE - Administrator, Civil Service (Medical): No    Lack of Transportation (Non-Medical): No  Physical Activity: Not on file  Stress: No Stress Concern Present (05/17/2023)   [employer] of Occupational Health - Occupational Stress Questionnaire    Feeling of Stress : Only a little  Social Connections: Not on file    Allergies: No Known Allergies  Metabolic Disorder Labs: Lab Results  Component Value Date   HGBA1C 5.5 12/31/2015   MPG 111 12/31/2015   MPG 114 12/30/2015   Lab Results  Component Value Date   PROLACTIN 16.0 (H) 12/31/2015   PROLACTIN 5.5 12/30/2015   Lab Results  Component Value Date   CHOL 148 10/29/2022   TRIG 80 10/29/2022   HDL 43 10/29/2022   CHOLHDL 3.4 10/29/2022   VLDL 16 10/29/2022   LDLCALC 89 10/29/2022   LDLCALC 144 (H) 12/31/2015   Lab Results  Component Value Date   TSH 0.374 10/29/2022   TSH 1.985 12/31/2015    Therapeutic Level Labs: Lab Results  Component Value Date   LITHIUM  0.59 (L) 01/15/2023   LITHIUM  0.29 (L) 01/12/2023   No results found for: VALPROATE No results found for: CBMZ  Current Medications: Current Outpatient Medications  Medication Sig Dispense Refill   ARIPiprazole  (ABILIFY ) 10 MG tablet Take 1 tablet (10 mg total) by mouth daily. 30 tablet 2   Cholecalciferol  (VITAMIN D -3) 125 MCG (5000 UT) TABS Take 125 mcg by mouth daily.     metoprolol  succinate (TOPROL -XL) 25 MG 24 hr tablet Take 1 tablet (25 mg total) by mouth daily. 30 tablet 0   traZODone  (DESYREL ) 50 MG tablet Take 1 tablet (50 mg total) by mouth at bedtime as needed for  sleep. 20 tablet 0   No current facility-administered medications for this visit.     Musculoskeletal: Strength & Muscle Tone: within normal limits Gait & Station: normal Patient leans: N/A  Psychiatric Specialty Exam: Review of Systems  Musculoskeletal:        Left leg pain    Blood pressure 122/80, pulse (!) 56, height 5' 11 (1.803 m), weight 167 lb (75.8 kg).Body mass index is 23.29 kg/m.  General Appearance: Fairly Groomed  Eye Contact:  Fair  Speech:  Slow  Volume:  Decreased  Mood:  Euthymic  Affect:  Appropriate  Thought Process:  Goal Directed  Orientation:  Full (Time,  Place, and Person)  Thought Content: Logical   Suicidal Thoughts:  No  Homicidal Thoughts:  No  Memory:  Immediate;   Good Recent;   Good Remote;   Fair  Judgement:  Intact  Insight:  Present  Psychomotor Activity:  Decreased and jerky movements  Concentration:  Concentration: Fair and Attention Span: Fair  Recall:  [employer] of Knowledge: Fair  Language: Fair  Akathisia:  No  Handed:  Right  AIMS (if indicated): not done  Assets:  Communication Skills Desire for Improvement Housing Social Support Transportation  ADL's:  Intact  Cognition: WNL  Sleep:  Good   Screenings: AIMS    Flowsheet Row [facility]  AIMS Total Score 0   AUDIT    Flowsheet Row Admission (Dis[facility]  Alcohol Use Disorder Identification Test Final Score (AUDIT) 23   CAGE-AID    Flowsheet Row ED to Hosp-Admission (Discharged) from 01/04/2023 in [facility]  CAGE-AID Score 0   GAD-7    Flowsheet Row [facility] Office Visit from 05/17/2023 in [facility] [address]  Total GAD-7 Score 3 6   PHQ2-9    Flowsheet Row Office Visit from  11[facility]2023 [facility] Office Visit from 09/13/2023 [facility] from 07/11/2023 in [facility] Office Visit from 05/17/2023 in [facility]  PHQ-2 Total Score 0 2 0 3  PHQ-9 Total Score 2 6 -- 8   Flowsheet Row Office Visit from 11/01/2023 [facility] Office Visit from 09/13/2023 [facility] from 07/11/2023 in [facility]  C-SSRS RISK CATEGORY Error: Question 6 not populated No Risk No Risk     Assessment and Plan: Patient is 63 year old Caucasian divorced man with history of bipolar disorder, alcohol use, anxiety and now concerned about involuntary movements.  He read about Abilify  and wondering if he is having any akathisia.  He does not want to change the Abilify  since it is working well.  Recommend to trial low-dose Cogentin and we can try 0.5 to 1 mg to take at bedtime along with Abilify .  He is not taking trazodone  because he is sleeping good.  He remains sober from drinking and is almost a year now.  He is pleased his disability is approved.  If Cogentin did not help we will try Ingrezza.  I encouraged to call back if is any question or any concern.  Follow-up in 3 months.  His PCP is Greig Senior and he is taking blood pressure medicine but had had not had blood work in a while.  Reminded to have labs.  Collaboration of Care: Collaboration of Care: Other provider involved in patient's care AEB notes are available in epic to review  Patient/Guardian was advised Release of Information must be obtained prior to any record release in order to collaborate their care with an outside provider. Patient/Guardian was advised if they have not already done so to contact the registration department to sign all necessary forms in order for us  to release  information regarding their care.   Consent: Patient/Guardian gives verbal consent for treatment and assignment of benefits for services provided during this visit. Patient/Guardian expressed understanding and agreed to proceed.  Leni ONEIDA Client, MD 02/07/2024, 3:21 PM

## 2024-02-08 ENCOUNTER — Other Ambulatory Visit: Payer: Self-pay | Admitting: Family

## 2024-02-11 NOTE — Telephone Encounter (Signed)
 Schedule appointment. Last office visit 05/17/2023. During the interim report to the Emergency Department/Urgent Care/call 911 for immediate medical evaluation.

## 2024-02-12 NOTE — Telephone Encounter (Signed)
 Pt scheduled

## 2024-02-15 ENCOUNTER — Ambulatory Visit: Payer: Self-pay

## 2024-02-15 NOTE — Telephone Encounter (Signed)
 FYI Only or Action Required?: Action required by provider: clinical question for provider.  Patient was last seen in primary care on 05/17/2023 by Lorren Greig PARAS, NP.  Called Nurse Triage reporting Medication Problem.  Triage Disposition: Call PCP When Office is Open  Patient/caregiver understands and will follow disposition?: Yes     Copied from CRM [id number]. Topic: Clinical - Medical Advice >> Feb 15, 2024  3:29 PM Kevelyn M wrote: Reason for CRM: Patient's mother calling because the patient does not know if he needs to take the metoprolol  succinate (TOPROL -XL) 25 MG 24 hr tablet because his blood pressure is normal without medication. Please advise.  Call back #[phone]       Reason for Disposition  [1] Caller has NON-URGENT medicine question about med that PCP prescribed AND [2] triager unable to answer question  Answer Assessment - Initial Assessment Questions 1. NAME of MEDICINE: What medicine(s) are you calling about?     metoprolol  succinate (TOPROL -XL) 25 MG 24 hr tablet 2. QUESTION: What is your question? (e.g., double dose of medicine, side effect)     Should he take it if it's normal, 130/80 3. PRESCRIBER: Who prescribed the medicine? Reason: if prescribed by specialist, call should be referred to that group.     Greig Lorren, NP  Protocols used: Medication Question Call-A-AH

## 2024-02-18 ENCOUNTER — Encounter: Payer: Self-pay | Admitting: Radiology

## 2024-02-18 ENCOUNTER — Ambulatory Visit: Payer: Self-pay | Admitting: Student

## 2024-02-19 NOTE — Telephone Encounter (Signed)
 Patient established with [facility] at [facility] A Dept of [employer]. [employer] [employer]. Please request advisement from the same. Thank you.

## 2024-02-19 NOTE — Telephone Encounter (Signed)
 I called patient and no one answered so I left a voicemail to return my call

## 2024-02-20 NOTE — Telephone Encounter (Signed)
 I called patient no one answered so I left a voicemail to return my call

## 2024-02-21 NOTE — Telephone Encounter (Signed)
 I called patient with pcp recommendations no one answered so I left a voicemail to return my call.

## 2024-02-27 ENCOUNTER — Ambulatory Visit: Admit: 2024-02-27 | Payer: MEDICAID | Admitting: Student

## 2024-02-27 SURGERY — PROCEDURE, BONE GRAFT
Anesthesia: General | Laterality: Left

## 2024-03-03 ENCOUNTER — Other Ambulatory Visit: Payer: Self-pay | Admitting: Family

## 2024-03-10 ENCOUNTER — Other Ambulatory Visit: Payer: Self-pay | Admitting: Emergency Medicine

## 2024-03-18 ENCOUNTER — Ambulatory Visit: Admitting: Family

## 2024-04-15 ENCOUNTER — Ambulatory Visit (INDEPENDENT_AMBULATORY_CARE_PROVIDER_SITE_OTHER): Payer: MEDICAID | Admitting: Family

## 2024-04-15 ENCOUNTER — Encounter: Payer: Self-pay | Admitting: Family

## 2024-04-15 VITALS — BP 138/91 | HR 52 | Temp 97.4°F | Resp 16 | Ht 71.0 in | Wt 164.4 lb

## 2024-04-15 DIAGNOSIS — I10 Essential (primary) hypertension: Secondary | ICD-10-CM

## 2024-04-15 MED ORDER — METOPROLOL SUCCINATE ER 25 MG PO TB24: 25.0000 mg | ORAL_TABLET | Freq: Every day | ORAL | 0 refills | Status: AC

## 2024-04-15 NOTE — Progress Notes (Signed)
 "   Patient ID: ESPIRIDION SUPINSKI, male    DOB: 1961/01/11  MRN: [id number]  CC: Chronic Conditions Follow-Up  Subjective: [name] is a 63 y.o. male who presents for chronic conditions follow-up.   His concerns today include:  Doing well on Metoprolol  Succinate however states he hasn't taken the same for several months due to needing refills. Established with Cardiology. He does not complain of red flag symptoms such as but not limited to chest pain, shortness of breath, worst headache of life, nausea/vomiting.   Patient Active Problem List   Diagnosis Date Noted   Bipolar disorder, in partial remission, most recent episode depressed (HCC) 01/08/2023   Type I or II open fracture of left tibia and fibula 01/08/2023   Tibia fracture 01/04/2023   Bipolar 1 disorder (HCC) 10/29/2022   Wernicke encephalopathy 10/29/2022   Alcohol use disorder 10/29/2022   Anxiety and depression    Bipolar affective disorder in remission 01/16/2016   Cocaine use disorder, mild, abuse (HCC) 12/31/2015     Medications Ordered Prior to Encounter[1]  Allergies[2]  Social History   Socioeconomic History   Marital status: Widowed    Spouse name: Not on file   Number of children: Not on file   Years of education: Not on file   Highest education level: Not on file  Occupational History   Not on file  Tobacco Use   Smoking status: Some Days    Types: Cigars   Smokeless tobacco: Never  Vaping Use   Vaping status: Never Used  Substance and Sexual Activity   Alcohol use: Not Currently    Comment: weekly   Drug use: Not Currently   Sexual activity: Not on file  Other Topics Concern   Not on file  Social History Narrative   Not on file   Social Drivers of Health   Tobacco Use: High Risk (04/15/2024)   Patient History    Smoking Tobacco Use: Some Days    Smokeless Tobacco Use: Never    Passive Exposure: Not on file  Financial Resource Strain: Low Risk (05/17/2023)   Overall Financial  Resource Strain (CARDIA)    Difficulty of Paying Living Expenses: Not hard at all  Food Insecurity: No Food Insecurity (01/06/2023)   Hunger Vital Sign    Worried About Running Out of Food in the Last Year: Never true    Ran Out of Food in the Last Year: Never true  Transportation Needs: No Transportation Needs (01/06/2023)   PRAPARE - Administrator, Civil Service (Medical): No    Lack of Transportation (Non-Medical): No  Physical Activity: Not on file  Stress: No Stress Concern Present (05/17/2023)   [employer] of Occupational Health - Occupational Stress Questionnaire    Feeling of Stress : Only a little  Social Connections: Not on file  Intimate Partner Violence: Not At Risk (01/06/2023)   Humiliation, Afraid, Rape, and Kick questionnaire    Fear of Current or Ex-Partner: No    Emotionally Abused: No    Physically Abused: No    Sexually Abused: No  Depression (PHQ2-9): Low Risk (04/15/2024)   Depression (PHQ2-9)    PHQ-2 Score: 0  Alcohol Screen: High Risk (10/28/2022)   Alcohol Screen    Last Alcohol Screening Score (AUDIT): 23  Housing: Low Risk (01/06/2023)   Housing    Last Housing Risk Score: 0  Utilities: Not At Risk (01/06/2023)   AHC Utilities    Threatened with loss of utilities: No  Health Literacy: Adequate Health Literacy (05/17/2023)   B1300 Health Literacy    Frequency of need for help with medical instructions: Never    Family History  Problem Relation Age of Onset   Hyperlipidemia Mother    Alcoholism Mother    Suicidality Cousin     Past Surgical History:  Procedure Laterality Date   EYE SURGERY     I & D EXTREMITY Right 01/05/2023   Procedure: IRRIGATION AND DEBRIDEMENT RIGHT FOREARM;  Surgeon: Kendal Franky SQUIBB, MD;  Location: [facility] OR;  Service: Orthopedics;  Laterality: Right;   ORIF TIBIA FRACTURE Right 01/05/2023   Procedure: OPEN REDUCTION INTERNAL FIXATION (ORIF) TIBIA FRACTURE;  Surgeon: Kendal Franky SQUIBB, MD;  Location: [facility] OR;   Service: Orthopedics;  Laterality: Right;   TIBIA IM NAIL INSERTION Left 01/05/2023   Procedure: INTRAMEDULLARY (IM) NAIL TIBIAL;  Surgeon: Kendal Franky SQUIBB, MD;  Location: [facility] OR;  Service: Orthopedics;  Laterality: Left;    ROS: Review of Systems Negative except as stated above  PHYSICAL EXAM: BP (!) 138/91   Pulse (!) 52   Temp (!) 97.4 F (36.3 C) (Oral)   Resp 16   Ht 5' 11 (1.803 m)   Wt 164 lb 6.4 oz (74.6 kg)   SpO2 98%   BMI 22.93 kg/m   Physical Exam HENT:     Head: Normocephalic and atraumatic.     Nose: Nose normal.     Mouth/Throat:     Mouth: Mucous membranes are moist.     Pharynx: Oropharynx is clear.  Eyes:     Extraocular Movements: Extraocular movements intact.     Conjunctiva/sclera: Conjunctivae normal.     Pupils: Pupils are equal, round, and reactive to light.  Cardiovascular:     Rate and Rhythm: Bradycardia present.     Pulses: Normal pulses.     Heart sounds: Normal heart sounds.  Pulmonary:     Effort: Pulmonary effort is normal.     Breath sounds: Normal breath sounds.  Musculoskeletal:        General: Normal range of motion.     Cervical back: Normal range of motion and neck supple.  Neurological:     General: No focal deficit present.     Mental Status: He is alert and oriented to person, place, and time.  Psychiatric:        Mood and Affect: Mood normal.        Behavior: Behavior normal.     ASSESSMENT AND PLAN: 1. Primary hypertension (Primary) - Blood pressure relative to goal. Patient asymptomatic without chest pressure, chest pain, palpitations, shortness of breath, worst headache of life, and any additional red flag symptoms. - Resume Metoprolol  Succinate as prescribed.  - Routine screening.  - Counseled on blood pressure goal of less than 130/80, low-sodium, DASH diet, medication compliance, and 150 minutes of moderate intensity exercise per week as tolerated. Counseled on medication adherence and adverse effects. - Keep all  scheduled appointments with established Cardiology. - Follow-up with primary provider in 4 weeks or sooner if needed. - metoprolol  succinate (TOPROL -XL) 25 MG 24 hr tablet; Take 1 tablet (25 mg total) by mouth daily.  Dispense: 90 tablet; Refill: 0 - Basic Metabolic Panel   Patient was given the opportunity to ask questions.  Patient verbalized understanding of the plan and was able to repeat key elements of the plan. Patient was given clear instructions to go to Emergency Department or return to medical center if symptoms don't improve, worsen, or  new problems develop.The patient verbalized understanding.   Orders Placed This Encounter  Procedures   Basic Metabolic Panel     Requested Prescriptions   Signed Prescriptions Disp Refills   metoprolol  succinate (TOPROL -XL) 25 MG 24 hr tablet 90 tablet 0    Sig: Take 1 tablet (25 mg total) by mouth daily.    Return in about 4 weeks (around 05/13/2024) for Follow-Up or next available chronic conditions.  Greig JINNY Chute, NP      [1]  Current Outpatient Medications on File Prior to Visit  Medication Sig Dispense Refill   ARIPiprazole  (ABILIFY ) 10 MG tablet Take 1 tablet (10 mg total) by mouth daily. 30 tablet 2   Cholecalciferol  (VITAMIN D -3) 125 MCG (5000 UT) TABS Take 125 mcg by mouth daily.     traZODone  (DESYREL ) 50 MG tablet Take 1 tablet (50 mg total) by mouth at bedtime as needed for sleep. 20 tablet 0   benztropine  (COGENTIN ) 1 MG tablet Take 0.5-1 tablets (0.5-1 mg total) by mouth at bedtime. (Patient not taking: Reported on 02/21/2024) 30 tablet 2   No current facility-administered medications on file prior to visit.  [2] No Known Allergies  "

## 2024-04-15 NOTE — Progress Notes (Signed)
Follow up, medication refill

## 2024-04-16 ENCOUNTER — Ambulatory Visit: Payer: Self-pay | Admitting: Family

## 2024-04-16 DIAGNOSIS — N1831 Chronic kidney disease, stage 3a: Secondary | ICD-10-CM

## 2024-04-16 LAB — BASIC METABOLIC PANEL WITH GFR
BUN/Creatinine Ratio: 13 (ref 10–24)
BUN: 20 mg/dL (ref 8–27)
CO2: 23 mmol/L (ref 20–29)
Calcium: 10.4 mg/dL — ABNORMAL HIGH (ref 8.6–10.2)
Chloride: 101 mmol/L (ref 96–106)
Creatinine, Ser: 1.51 mg/dL — ABNORMAL HIGH (ref 0.76–1.27)
Glucose: 83 mg/dL (ref 70–99)
Potassium: 4 mmol/L (ref 3.5–5.2)
Sodium: 140 mmol/L (ref 134–144)
eGFR: 52 mL/min/1.73 — ABNORMAL LOW

## 2024-04-28 NOTE — Progress Notes (Signed)
 noted

## 2024-05-08 ENCOUNTER — Other Ambulatory Visit: Payer: Self-pay

## 2024-05-08 ENCOUNTER — Encounter (HOSPITAL_COMMUNITY): Payer: Self-pay | Admitting: Psychiatry

## 2024-05-08 ENCOUNTER — Ambulatory Visit (HOSPITAL_COMMUNITY): Payer: MEDICAID | Admitting: Psychiatry

## 2024-05-08 DIAGNOSIS — F419 Anxiety disorder, unspecified: Secondary | ICD-10-CM

## 2024-05-08 DIAGNOSIS — F319 Bipolar disorder, unspecified: Secondary | ICD-10-CM | POA: Diagnosis not present

## 2024-05-08 DIAGNOSIS — F1021 Alcohol dependence, in remission: Secondary | ICD-10-CM

## 2024-05-08 DIAGNOSIS — F5105 Insomnia due to other mental disorder: Secondary | ICD-10-CM

## 2024-05-08 DIAGNOSIS — F99 Mental disorder, not otherwise specified: Secondary | ICD-10-CM

## 2024-05-08 DIAGNOSIS — R259 Unspecified abnormal involuntary movements: Secondary | ICD-10-CM | POA: Diagnosis not present

## 2024-05-08 MED ORDER — ARIPIPRAZOLE 10 MG PO TABS: 10.0000 mg | ORAL_TABLET | Freq: Every day | ORAL | 2 refills | Status: AC

## 2024-05-08 MED ORDER — TRAZODONE HCL 100 MG PO TABS: 100.0000 mg | ORAL_TABLET | Freq: Every evening | ORAL | 2 refills | Status: AC | PRN

## 2024-05-08 MED ORDER — BENZTROPINE MESYLATE 1 MG PO TABS: 1.0000 mg | ORAL_TABLET | Freq: Every day | ORAL | 2 refills | Status: AC

## 2024-05-08 NOTE — Progress Notes (Signed)
 [facility] MD/PA/NP OP Progress Note  05/08/2024 1:26 PM [name]  MRN:  [id number]  Chief Complaint:  Chief Complaint  Patient presents with   Follow-up   HPI: Patient came to the office for his appointment.  He is taking Abilify  10 mg and we added Cogentin  to help the tremors and shakes.  He was reporting jerky like movements but now with the help of Cogentin  1 mg he feels much relief.  He sleeps fair.  He he tried to go back on trazodone  but sometime he does not feel the dose is strong enough.  He denies any irritability, anger, mania.  Recently had a blood work.  His creatinine is 1.51.  His PCP referred him to see nephrology and he is trying to make an appointment.  He remains sober from drinking.  His last drink was September 2024.  He denies any anxiety or panic attack.  He had a good support from his mother.  He denies any suicidal thoughts, homicidal thoughts.  His appetite is okay and his weight is stable.  He is on disability.  He does not have negative thoughts or any ruminative thoughts.  His energy level is fair.  He wants to keep the Abilify  and Cogentin .  Visit Diagnosis:    ICD-10-CM   1. Bipolar 1 disorder (HCC)  F31.9 ARIPiprazole  (ABILIFY ) 10 MG tablet    benztropine  (COGENTIN ) 1 MG tablet    traZODone  (DESYREL ) 100 MG tablet    2. Alcohol dependence in remission (HCC)  F10.21 ARIPiprazole  (ABILIFY ) 10 MG tablet    benztropine  (COGENTIN ) 1 MG tablet    3. Involuntary movements  R25.9 benztropine  (COGENTIN ) 1 MG tablet    4. Anxiety  F41.9 traZODone  (DESYREL ) 100 MG tablet    5. Insomnia due to other mental disorder  F51.05 traZODone  (DESYREL ) 100 MG tablet   F99        Past Psychiatric History: Reviewed History of bipolar disorder with multiple hospitalization due to noncompliant with medication and manic episodes.  Took lithium , Seroquel , trazodone , Klonopin, hydroxyzine , Wellbutrin .  History of cocaine use, DUI and Wernicke's encephalopathy.  He is to see  [facility]iat[facility].    Past Medical History:  Past Medical History:  Diagnosis Date   Alcohol use disorder 10/29/2022   Anxiety and depression    Bipolar 1 disorder (HCC)    Cocaine use disorder, mild, abuse (HCC) 12/31/2015   Tibia fracture 01/04/2023   Type I or II open fracture of left tibia and fibula 01/08/2023   Wernicke encephalopathy 10/29/2022    Past Surgical History:  Procedure Laterality Date   EYE SURGERY     I & D EXTREMITY Right 01/05/2023   Procedure: IRRIGATION AND DEBRIDEMENT RIGHT FOREARM;  Surgeon: Kendal Franky SQUIBB, MD;  Location: [facility];  Service: Orthopedics;  Laterality: Right;   ORIF TIBIA FRACTURE Right 01/05/2023   Procedure: OPEN REDUCTION INTERNAL FIXATION (ORIF) TIBIA FRACTURE;  Surgeon: Kendal Franky SQUIBB, MD;  Location: [facility];  Service: Orthopedics;  Laterality: Right;   TIBIA IM NAIL INSERTION Left 01/05/2023   Procedure: INTRAMEDULLARY (IM) NAIL TIBIAL;  Surgeon: Kendal Franky SQUIBB, MD;  Location: [facility];  Service: Orthopedics;  Laterality: Left;    Family Psychiatric History: Reviewed  Family History:  Family History  Problem Relation Age of Onset   Hyperlipidemia Mother    Alcoholism Mother    Suicidality Cousin     Social History:  Social History   Socioeconomic History  Marital status: Widowed    Spouse name: Not on file   Number of children: Not on file   Years of education: Not on file   Highest education level: Not on file  Occupational History   Not on file  Tobacco Use   Smoking status: Some Days    Types: Cigars   Smokeless tobacco: Never  Vaping Use   Vaping status: Never Used  Substance and Sexual Activity   Alcohol use: Not Currently    Comment: weekly   Drug use: Not Currently   Sexual activity: Not on file  Other Topics Concern   Not on file  Social History Narrative   Not on file   Social Drivers of Health   Tobacco Use: High Risk (05/08/2024)   Patient History    Smoking  Tobacco Use: Some Days    Smokeless Tobacco Use: Never    Passive Exposure: Not on file  Financial Resource Strain: Low Risk (05/17/2023)   Overall Financial Resource Strain (CARDIA)    Difficulty of Paying Living Expenses: Not hard at all  Food Insecurity: No Food Insecurity (01/06/2023)   Hunger Vital Sign    Worried About Running Out of Food in the Last Year: Never true    Ran Out of Food in the Last Year: Never true  Transportation Needs: No Transportation Needs (01/06/2023)   PRAPARE - Administrator, Civil Service (Medical): No    Lack of Transportation (Non-Medical): No  Physical Activity: Not on file  Stress: No Stress Concern Present (05/17/2023)   [employer] of Occupational Health - Occupational Stress Questionnaire    Feeling of Stress : Only a little  Social Connections: Not on file  Depression (PHQ2-9): Low Risk (04/15/2024)   Depression (PHQ2-9)    PHQ-2 Score: 0  Alcohol Screen: High Risk (10/28/2022)   Alcohol Screen    Last Alcohol Screening Score (AUDIT): 23  Housing: Low Risk (01/06/2023)   Housing    Last Housing Risk Score: 0  Utilities: Not At Risk (01/06/2023)   AHC Utilities    Threatened with loss of utilities: No  Health Literacy: Adequate Health Literacy (05/17/2023)   B1300 Health Literacy    Frequency of need for help with medical instructions: Never    Allergies: No Known Allergies  Metabolic Disorder Labs: Lab Results  Component Value Date   HGBA1C 5.5 12/31/2015   MPG 111 12/31/2015   MPG 114 12/30/2015   Lab Results  Component Value Date   PROLACTIN 16.0 (H) 12/31/2015   PROLACTIN 5.5 12/30/2015   Lab Results  Component Value Date   CHOL 148 10/29/2022   TRIG 80 10/29/2022   HDL 43 10/29/2022   CHOLHDL 3.4 10/29/2022   VLDL 16 10/29/2022   LDLCALC 89 10/29/2022   LDLCALC 144 (H) 12/31/2015   Lab Results  Component Value Date   TSH 0.374 10/29/2022   TSH 1.985 12/31/2015    Therapeutic Level Labs: Lab Results   Component Value Date   LITHIUM  0.59 (L) 01/15/2023   LITHIUM  0.29 (L) 01/12/2023   No results found for: VALPROATE No results found for: CBMZ  Current Medications: Current Outpatient Medications  Medication Sig Dispense Refill   ARIPiprazole  (ABILIFY ) 10 MG tablet Take 1 tablet (10 mg total) by mouth daily. 30 tablet 2   benztropine  (COGENTIN ) 1 MG tablet Take 0.5-1 tablets (0.5-1 mg total) by mouth at bedtime. 30 tablet 2   Cholecalciferol  (VITAMIN D -3) 125 MCG (5000 UT) TABS Take 125 mcg  by mouth daily.     metoprolol  succinate (TOPROL -XL) 25 MG 24 hr tablet Take 1 tablet (25 mg total) by mouth daily. 90 tablet 0   traZODone  (DESYREL ) 50 MG tablet Take 1 tablet (50 mg total) by mouth at bedtime as needed for sleep. 20 tablet 0   No current facility-administered medications for this visit.     Musculoskeletal: Strength & Muscle Tone: within normal limits Gait & Station: normal Patient leans: N/A  Psychiatric Specialty Exam: Review of Systems  Blood pressure 138/78, pulse 73, height 5' 10.5 (1.791 m), weight 168 lb 12.8 oz (76.6 kg).Body mass index is 23.88 kg/m.  General Appearance: Casual  Eye Contact:  Fair  Speech:  Slow  Volume:  Normal  Mood:  Euthymic  Affect:  Appropriate  Thought Process:  Goal Directed  Orientation:  Full (Time, Place, and Person)  Thought Content: Logical   Suicidal Thoughts:  No  Homicidal Thoughts:  No  Memory:  Immediate;   Good Recent;   Good Remote;   Fair  Judgement:  Intact  Insight:  Present  Psychomotor Activity:  Decreased  Concentration:  Concentration: Fair and Attention Span: Fair  Recall:  [employer] of Knowledge: Fair  Language: Fair  Akathisia:  No  Handed:  Right  AIMS (if indicated): not done  Assets:  Communication Skills Desire for Improvement Housing Social Support Transportation  ADL's:  Intact  Cognition: WNL  Sleep:  Fair   Screenings: AIMS    Flowsheet Row [facility]  AIMS Total Score 0   AUDIT    Flowsheet Row [facility]  Alcohol Use Disorder Identification Test Final Score (AUDIT) 23   CAGE-AID    Flowsheet Row ED to Hosp-Admission (Discharged) from 01/04/2023 in [facility]  CAGE-AID Score 0   GAD-7    Flowsheet Row Office Visit from 04/15/2024 in [facility] Office Visit from [facility]/13/2023 [facility] Office Visit from 05/17/2023 in [facility]  Total GAD-7 Score 1 3 6    PHQ2-9    Flowsheet Row Office Visit from 04/15/2024 in [facility] Office Visit from [facility] Office Visit from 09[facility]13/2023 in [facility] from 07/11/2023 in [facility] Visit from 05/17/2023 in [facility]  PHQ-2 Total Score 0 0 2 0 3  PHQ-9 Total Score -- 2 6 -- 8   Flowsheet Row [facility] Office Visit from 09/13/2023 in [facility] from 07/11/2023 in [facility]  C-SSRS RISK CATEGORY Error: Question 6 not populated No Risk No Risk     Assessment and Plan: Patient is 64 year old Caucasian divorced man with bipolar disorder, anxiety, alcohol dependence in complete and sustained remission, involuntary movements and insomnia.  Patient like Cogentin  1 mg which is helping his involuntary movements.  He reported sleep still is a issue.  He tried to take the trazodone  50 mg but that did not help.  He like to try higher dose.  Will try 100 mg at bedtime.  He does not take trazodone  every night.  I  reviewed blood work results with him.  His creatinine  is 1.51.  His PCP recommend to see nephrology.  I encouraged to call and schedule appointment.  Encourage increased water intake.  Recommend to call back if is any question or any concern.  If patient remains stable we will consider switching him to every 6 months.    Collaboration of Care: Collaboration of Care: Other provider involved in patient's care AEB notes are available in epic to review  Patient/Guardian was advised Release of Information must be obtained prior to any record release in order to collaborate their care with an outside provider. Patient/Guardian was advised if they have not already done so to contact the registration department to sign all necessary forms in order for us  to release information regarding their care.   Consent: Patient/Guardian gives verbal consent for treatment and assignment of benefits for services provided during this visit. Patient/Guardian expressed understanding and agreed to proceed.    Leni ONEIDA Client, MD 05/08/2024, 1:26 PM

## 2024-05-19 ENCOUNTER — Ambulatory Visit: Payer: Self-pay | Admitting: Family

## 2024-08-07 ENCOUNTER — Ambulatory Visit (HOSPITAL_COMMUNITY): Payer: MEDICAID | Admitting: Psychiatry
